# Patient Record
Sex: Female | Born: 1935 | Race: White | Hispanic: No | Smoking: Never smoker
Health system: Southern US, Community
[De-identification: ages and names within clinical notes are randomized; demographics above are authoritative.]

## PROBLEM LIST (undated history)

## (undated) DIAGNOSIS — N3010 Interstitial cystitis (chronic) without hematuria: Secondary | ICD-10-CM

## (undated) DIAGNOSIS — K219 Gastro-esophageal reflux disease without esophagitis: Secondary | ICD-10-CM

## (undated) DIAGNOSIS — K589 Irritable bowel syndrome without diarrhea: Secondary | ICD-10-CM

## (undated) DIAGNOSIS — T8859XA Other complications of anesthesia, initial encounter: Secondary | ICD-10-CM

## (undated) DIAGNOSIS — I714 Abdominal aortic aneurysm, without rupture, unspecified: Secondary | ICD-10-CM

## (undated) DIAGNOSIS — R112 Nausea with vomiting, unspecified: Secondary | ICD-10-CM

## (undated) DIAGNOSIS — I4891 Unspecified atrial fibrillation: Secondary | ICD-10-CM

## (undated) DIAGNOSIS — M51379 Other intervertebral disc degeneration, lumbosacral region without mention of lumbar back pain or lower extremity pain: Secondary | ICD-10-CM

## (undated) DIAGNOSIS — Z9889 Other specified postprocedural states: Secondary | ICD-10-CM

## (undated) DIAGNOSIS — F329 Major depressive disorder, single episode, unspecified: Secondary | ICD-10-CM

## (undated) DIAGNOSIS — M1990 Unspecified osteoarthritis, unspecified site: Secondary | ICD-10-CM

## (undated) DIAGNOSIS — M5137 Other intervertebral disc degeneration, lumbosacral region: Secondary | ICD-10-CM

## (undated) DIAGNOSIS — Z87440 Personal history of urinary (tract) infections: Secondary | ICD-10-CM

## (undated) DIAGNOSIS — I2510 Atherosclerotic heart disease of native coronary artery without angina pectoris: Secondary | ICD-10-CM

## (undated) DIAGNOSIS — F32A Depression, unspecified: Secondary | ICD-10-CM

## (undated) DIAGNOSIS — T4145XA Adverse effect of unspecified anesthetic, initial encounter: Secondary | ICD-10-CM

## (undated) DIAGNOSIS — J302 Other seasonal allergic rhinitis: Secondary | ICD-10-CM

## (undated) DIAGNOSIS — K5790 Diverticulosis of intestine, part unspecified, without perforation or abscess without bleeding: Secondary | ICD-10-CM

## (undated) DIAGNOSIS — E785 Hyperlipidemia, unspecified: Secondary | ICD-10-CM

## (undated) DIAGNOSIS — M8580 Other specified disorders of bone density and structure, unspecified site: Secondary | ICD-10-CM

## (undated) DIAGNOSIS — C4490 Unspecified malignant neoplasm of skin, unspecified: Secondary | ICD-10-CM

## (undated) HISTORY — DX: Diverticulosis of intestine, part unspecified, without perforation or abscess without bleeding: K57.90

## (undated) HISTORY — DX: Abdominal aortic aneurysm, without rupture, unspecified: I71.40

## (undated) HISTORY — PX: CYSTOCELE REPAIR: SHX163

## (undated) HISTORY — DX: Depression, unspecified: F32.A

## (undated) HISTORY — PX: BREAST SURGERY: SHX581

## (undated) HISTORY — DX: Atherosclerotic heart disease of native coronary artery without angina pectoris: I25.10

## (undated) HISTORY — PX: ABDOMINAL HYSTERECTOMY: SHX81

## (undated) HISTORY — DX: Unspecified osteoarthritis, unspecified site: M19.90

## (undated) HISTORY — PX: REFRACTIVE SURGERY: SHX103

## (undated) HISTORY — DX: Abdominal aortic aneurysm, without rupture: I71.4

## (undated) HISTORY — DX: Unspecified atrial fibrillation: I48.91

## (undated) HISTORY — PX: RECTOCELE REPAIR: SHX761

## (undated) HISTORY — DX: Gastro-esophageal reflux disease without esophagitis: K21.9

## (undated) HISTORY — DX: Irritable bowel syndrome, unspecified: K58.9

## (undated) HISTORY — DX: Major depressive disorder, single episode, unspecified: F32.9

## (undated) HISTORY — DX: Hyperlipidemia, unspecified: E78.5

## (undated) HISTORY — DX: Other specified disorders of bone density and structure, unspecified site: M85.80

---

## 1995-12-18 HISTORY — PX: RIGHT COLECTOMY: SHX853

## 1997-11-16 HISTORY — PX: HERNIA REPAIR: SHX51

## 1998-09-16 HISTORY — PX: ESOPHAGOGASTRODUODENOSCOPY: SHX1529

## 1999-09-25 ENCOUNTER — Encounter: Admission: RE | Admit: 1999-09-25 | Discharge: 1999-09-25 | Payer: Self-pay | Admitting: Family Medicine

## 1999-09-29 ENCOUNTER — Encounter: Admission: RE | Admit: 1999-09-29 | Discharge: 1999-09-29 | Payer: Self-pay | Admitting: Family Medicine

## 1999-11-27 ENCOUNTER — Encounter: Payer: Self-pay | Admitting: Surgery

## 1999-11-27 ENCOUNTER — Ambulatory Visit (HOSPITAL_BASED_OUTPATIENT_CLINIC_OR_DEPARTMENT_OTHER): Admission: RE | Admit: 1999-11-27 | Discharge: 1999-11-27 | Payer: Self-pay | Admitting: Surgery

## 2000-05-17 LAB — HM DEXA SCAN

## 2000-10-08 ENCOUNTER — Encounter: Admission: RE | Admit: 2000-10-08 | Discharge: 2000-10-08 | Payer: Self-pay | Admitting: Family Medicine

## 2000-10-08 ENCOUNTER — Encounter: Payer: Self-pay | Admitting: Family Medicine

## 2000-10-14 ENCOUNTER — Ambulatory Visit (HOSPITAL_COMMUNITY): Admission: RE | Admit: 2000-10-14 | Discharge: 2000-10-14 | Payer: Self-pay | Admitting: Orthopedic Surgery

## 2000-10-14 ENCOUNTER — Encounter: Payer: Self-pay | Admitting: Orthopedic Surgery

## 2000-10-29 ENCOUNTER — Ambulatory Visit (HOSPITAL_COMMUNITY): Admission: RE | Admit: 2000-10-29 | Discharge: 2000-10-29 | Payer: Self-pay | Admitting: Internal Medicine

## 2000-10-29 ENCOUNTER — Encounter: Payer: Self-pay | Admitting: Orthopedic Surgery

## 2000-11-12 ENCOUNTER — Encounter: Payer: Self-pay | Admitting: Orthopedic Surgery

## 2000-11-12 ENCOUNTER — Ambulatory Visit (HOSPITAL_COMMUNITY): Admission: RE | Admit: 2000-11-12 | Discharge: 2000-11-12 | Payer: Self-pay | Admitting: Orthopedic Surgery

## 2001-03-14 ENCOUNTER — Emergency Department (HOSPITAL_COMMUNITY): Admission: EM | Admit: 2001-03-14 | Discharge: 2001-03-14 | Payer: Self-pay

## 2001-10-09 ENCOUNTER — Encounter: Payer: Self-pay | Admitting: Family Medicine

## 2001-10-09 ENCOUNTER — Encounter: Admission: RE | Admit: 2001-10-09 | Discharge: 2001-10-09 | Payer: Self-pay | Admitting: Family Medicine

## 2002-09-23 ENCOUNTER — Encounter: Payer: Self-pay | Admitting: Family Medicine

## 2002-09-23 ENCOUNTER — Other Ambulatory Visit: Admission: RE | Admit: 2002-09-23 | Discharge: 2002-09-23 | Payer: Self-pay | Admitting: Family Medicine

## 2002-09-23 LAB — CONVERTED CEMR LAB: Pap Smear: NORMAL

## 2002-10-02 LAB — FECAL OCCULT BLOOD, GUAIAC: Fecal Occult Blood: NEGATIVE

## 2002-10-22 ENCOUNTER — Encounter: Admission: RE | Admit: 2002-10-22 | Discharge: 2002-10-22 | Payer: Self-pay | Admitting: Family Medicine

## 2002-10-22 ENCOUNTER — Encounter: Payer: Self-pay | Admitting: Family Medicine

## 2002-10-22 LAB — HM MAMMOGRAPHY: HM Mammogram: NORMAL

## 2002-12-22 ENCOUNTER — Encounter: Payer: Self-pay | Admitting: Gastroenterology

## 2002-12-22 ENCOUNTER — Ambulatory Visit (HOSPITAL_COMMUNITY): Admission: RE | Admit: 2002-12-22 | Discharge: 2002-12-22 | Payer: Self-pay | Admitting: Gastroenterology

## 2003-01-26 LAB — HM COLONOSCOPY

## 2005-02-27 ENCOUNTER — Ambulatory Visit: Payer: Self-pay | Admitting: Family Medicine

## 2005-03-09 ENCOUNTER — Ambulatory Visit: Payer: Self-pay | Admitting: Family Medicine

## 2005-03-15 ENCOUNTER — Ambulatory Visit: Payer: Self-pay | Admitting: Family Medicine

## 2005-10-02 ENCOUNTER — Ambulatory Visit: Payer: Self-pay | Admitting: Family Medicine

## 2006-01-07 ENCOUNTER — Ambulatory Visit: Payer: Self-pay | Admitting: Family Medicine

## 2006-05-07 ENCOUNTER — Ambulatory Visit: Payer: Self-pay | Admitting: Family Medicine

## 2006-08-27 ENCOUNTER — Ambulatory Visit: Payer: Self-pay | Admitting: Family Medicine

## 2006-09-27 ENCOUNTER — Ambulatory Visit: Payer: Self-pay | Admitting: Family Medicine

## 2007-02-19 ENCOUNTER — Ambulatory Visit: Payer: Self-pay | Admitting: Family Medicine

## 2007-05-14 ENCOUNTER — Emergency Department (HOSPITAL_COMMUNITY): Admission: EM | Admit: 2007-05-14 | Discharge: 2007-05-14 | Payer: Self-pay | Admitting: Emergency Medicine

## 2007-05-14 ENCOUNTER — Telehealth (INDEPENDENT_AMBULATORY_CARE_PROVIDER_SITE_OTHER): Payer: Self-pay | Admitting: *Deleted

## 2007-05-19 ENCOUNTER — Ambulatory Visit: Payer: Self-pay | Admitting: Family Medicine

## 2007-05-19 DIAGNOSIS — Z87440 Personal history of urinary (tract) infections: Secondary | ICD-10-CM

## 2007-06-02 ENCOUNTER — Encounter: Payer: Self-pay | Admitting: Family Medicine

## 2007-06-02 DIAGNOSIS — M48061 Spinal stenosis, lumbar region without neurogenic claudication: Secondary | ICD-10-CM | POA: Insufficient documentation

## 2007-06-02 DIAGNOSIS — M1990 Unspecified osteoarthritis, unspecified site: Secondary | ICD-10-CM | POA: Insufficient documentation

## 2007-06-02 DIAGNOSIS — K589 Irritable bowel syndrome without diarrhea: Secondary | ICD-10-CM

## 2007-06-02 DIAGNOSIS — K219 Gastro-esophageal reflux disease without esophagitis: Secondary | ICD-10-CM

## 2007-06-02 DIAGNOSIS — E7800 Pure hypercholesterolemia, unspecified: Secondary | ICD-10-CM

## 2007-06-02 DIAGNOSIS — J45909 Unspecified asthma, uncomplicated: Secondary | ICD-10-CM

## 2007-06-02 DIAGNOSIS — N3010 Interstitial cystitis (chronic) without hematuria: Secondary | ICD-10-CM

## 2007-06-03 ENCOUNTER — Ambulatory Visit: Payer: Self-pay | Admitting: Family Medicine

## 2007-06-03 DIAGNOSIS — F329 Major depressive disorder, single episode, unspecified: Secondary | ICD-10-CM

## 2007-06-06 ENCOUNTER — Encounter: Payer: Self-pay | Admitting: Family Medicine

## 2007-06-18 ENCOUNTER — Telehealth (INDEPENDENT_AMBULATORY_CARE_PROVIDER_SITE_OTHER): Payer: Self-pay | Admitting: *Deleted

## 2007-06-24 ENCOUNTER — Telehealth: Payer: Self-pay | Admitting: Family Medicine

## 2007-06-26 ENCOUNTER — Ambulatory Visit: Payer: Self-pay | Admitting: Family Medicine

## 2007-06-26 LAB — CONVERTED CEMR LAB
HCT: 38.3 % (ref 36.0–46.0)
MCV: 89.2 fL (ref 78.0–100.0)
Neutro Abs: 6 10*3/uL (ref 1.4–7.7)
Neutrophils Relative %: 66.6 % (ref 43.0–77.0)
Platelets: 238 10*3/uL (ref 150–400)
RDW: 12.8 % (ref 11.5–14.6)
WBC: 9 10*3/uL (ref 4.5–10.5)

## 2007-09-18 ENCOUNTER — Ambulatory Visit: Payer: Self-pay | Admitting: Family Medicine

## 2008-01-15 ENCOUNTER — Ambulatory Visit: Payer: Self-pay | Admitting: Family Medicine

## 2008-01-19 ENCOUNTER — Ambulatory Visit: Payer: Self-pay

## 2008-01-21 ENCOUNTER — Encounter: Payer: Self-pay | Admitting: Family Medicine

## 2008-02-13 ENCOUNTER — Encounter: Payer: Self-pay | Admitting: Family Medicine

## 2008-05-07 ENCOUNTER — Encounter: Payer: Self-pay | Admitting: Family Medicine

## 2008-06-02 ENCOUNTER — Ambulatory Visit: Payer: Self-pay | Admitting: Family Medicine

## 2008-06-04 LAB — CONVERTED CEMR LAB
Basophils Relative: 0.3 % (ref 0.0–1.0)
Eosinophils Absolute: 0.1 10*3/uL (ref 0.0–0.7)
Eosinophils Relative: 1.3 % (ref 0.0–5.0)
HCT: 43 % (ref 36.0–46.0)
Lymphocytes Relative: 31.4 % (ref 12.0–46.0)
MCHC: 33.4 g/dL (ref 30.0–36.0)
MCV: 91.9 fL (ref 78.0–100.0)
Monocytes Absolute: 0.3 10*3/uL (ref 0.1–1.0)
Monocytes Relative: 4.3 % (ref 3.0–12.0)
Neutrophils Relative %: 62.7 % (ref 43.0–77.0)
Platelets: 213 10*3/uL (ref 150–400)
RBC: 4.68 M/uL (ref 3.87–5.11)

## 2008-06-11 ENCOUNTER — Telehealth (INDEPENDENT_AMBULATORY_CARE_PROVIDER_SITE_OTHER): Payer: Self-pay | Admitting: *Deleted

## 2008-06-30 ENCOUNTER — Ambulatory Visit: Payer: Self-pay | Admitting: Family Medicine

## 2008-06-30 LAB — CONVERTED CEMR LAB
Casts: 0 /lpf
Glucose, Urine, Semiquant: NEGATIVE
RBC / HPF: 1
Specific Gravity, Urine: <1.005

## 2008-07-01 ENCOUNTER — Encounter: Payer: Self-pay | Admitting: Family Medicine

## 2008-07-08 ENCOUNTER — Encounter: Payer: Self-pay | Admitting: Family Medicine

## 2008-07-14 ENCOUNTER — Encounter: Payer: Self-pay | Admitting: Family Medicine

## 2008-07-14 ENCOUNTER — Ambulatory Visit: Payer: Self-pay | Admitting: Vascular Surgery

## 2008-07-20 ENCOUNTER — Telehealth: Payer: Self-pay | Admitting: Family Medicine

## 2008-07-20 ENCOUNTER — Ambulatory Visit: Payer: Self-pay | Admitting: Family Medicine

## 2008-09-14 ENCOUNTER — Ambulatory Visit: Payer: Self-pay | Admitting: Family Medicine

## 2008-10-13 ENCOUNTER — Ambulatory Visit: Payer: Self-pay | Admitting: Family Medicine

## 2008-10-18 ENCOUNTER — Telehealth (INDEPENDENT_AMBULATORY_CARE_PROVIDER_SITE_OTHER): Payer: Self-pay | Admitting: Internal Medicine

## 2008-10-21 ENCOUNTER — Ambulatory Visit: Payer: Self-pay | Admitting: Family Medicine

## 2009-01-13 ENCOUNTER — Ambulatory Visit: Payer: Self-pay | Admitting: Vascular Surgery

## 2009-06-01 ENCOUNTER — Ambulatory Visit: Payer: Self-pay | Admitting: Family Medicine

## 2009-06-06 ENCOUNTER — Ambulatory Visit: Payer: Self-pay | Admitting: Family Medicine

## 2009-06-06 ENCOUNTER — Telehealth (INDEPENDENT_AMBULATORY_CARE_PROVIDER_SITE_OTHER): Payer: Self-pay | Admitting: *Deleted

## 2009-07-13 ENCOUNTER — Ambulatory Visit: Payer: Self-pay | Admitting: Vascular Surgery

## 2009-07-14 ENCOUNTER — Ambulatory Visit: Payer: Self-pay | Admitting: Internal Medicine

## 2009-07-14 ENCOUNTER — Inpatient Hospital Stay (HOSPITAL_COMMUNITY): Admission: EM | Admit: 2009-07-14 | Discharge: 2009-07-16 | Payer: Self-pay | Admitting: Emergency Medicine

## 2009-07-14 ENCOUNTER — Ambulatory Visit: Payer: Self-pay | Admitting: Family Medicine

## 2009-07-15 ENCOUNTER — Encounter: Payer: Self-pay | Admitting: Family Medicine

## 2009-07-17 DIAGNOSIS — I2510 Atherosclerotic heart disease of native coronary artery without angina pectoris: Secondary | ICD-10-CM

## 2009-07-17 HISTORY — DX: Atherosclerotic heart disease of native coronary artery without angina pectoris: I25.10

## 2009-07-18 ENCOUNTER — Telehealth: Payer: Self-pay | Admitting: Family Medicine

## 2009-07-28 ENCOUNTER — Ambulatory Visit: Payer: Self-pay | Admitting: Family Medicine

## 2009-07-28 DIAGNOSIS — I2510 Atherosclerotic heart disease of native coronary artery without angina pectoris: Secondary | ICD-10-CM | POA: Insufficient documentation

## 2009-07-28 LAB — CONVERTED CEMR LAB
Bacteria, UA: 0
Casts: 0 /lpf
Ketones, urine, test strip: NEGATIVE
Mucus, UA: 0
Specific Gravity, Urine: 1.01
Urine crystals, microscopic: 0 /hpf
WBC Urine, dipstick: NEGATIVE
WBC, UA: 1 cells/hpf
pH: 6

## 2009-08-11 ENCOUNTER — Telehealth: Payer: Self-pay | Admitting: Family Medicine

## 2009-08-16 ENCOUNTER — Telehealth: Payer: Self-pay | Admitting: Family Medicine

## 2009-08-24 ENCOUNTER — Ambulatory Visit: Payer: Self-pay | Admitting: Internal Medicine

## 2009-09-14 ENCOUNTER — Ambulatory Visit: Payer: Self-pay | Admitting: Family Medicine

## 2009-09-19 ENCOUNTER — Encounter: Payer: Self-pay | Admitting: Family Medicine

## 2009-09-21 ENCOUNTER — Encounter: Payer: Self-pay | Admitting: Family Medicine

## 2009-09-21 ENCOUNTER — Encounter: Admission: RE | Admit: 2009-09-21 | Discharge: 2009-09-21 | Payer: Self-pay | Admitting: Gastroenterology

## 2009-09-28 ENCOUNTER — Ambulatory Visit: Payer: Self-pay | Admitting: Vascular Surgery

## 2009-09-28 ENCOUNTER — Encounter: Payer: Self-pay | Admitting: Family Medicine

## 2009-10-17 ENCOUNTER — Ambulatory Visit: Payer: Self-pay | Admitting: Family Medicine

## 2009-10-26 ENCOUNTER — Ambulatory Visit (HOSPITAL_COMMUNITY): Admission: RE | Admit: 2009-10-26 | Discharge: 2009-10-26 | Payer: Self-pay | Admitting: Urology

## 2009-12-17 DIAGNOSIS — M8580 Other specified disorders of bone density and structure, unspecified site: Secondary | ICD-10-CM

## 2009-12-17 HISTORY — DX: Other specified disorders of bone density and structure, unspecified site: M85.80

## 2009-12-25 ENCOUNTER — Emergency Department (HOSPITAL_COMMUNITY): Admission: EM | Admit: 2009-12-25 | Discharge: 2009-12-25 | Payer: Self-pay | Admitting: Family Medicine

## 2010-01-11 ENCOUNTER — Ambulatory Visit: Payer: Self-pay | Admitting: Vascular Surgery

## 2010-01-19 ENCOUNTER — Telehealth (INDEPENDENT_AMBULATORY_CARE_PROVIDER_SITE_OTHER): Payer: Self-pay | Admitting: *Deleted

## 2010-01-20 ENCOUNTER — Ambulatory Visit: Payer: Self-pay | Admitting: Family Medicine

## 2010-04-26 ENCOUNTER — Ambulatory Visit: Payer: Self-pay | Admitting: Family Medicine

## 2010-05-01 ENCOUNTER — Telehealth: Payer: Self-pay | Admitting: Family Medicine

## 2010-06-22 ENCOUNTER — Telehealth: Payer: Self-pay | Admitting: Family Medicine

## 2010-08-09 ENCOUNTER — Ambulatory Visit: Payer: Self-pay | Admitting: Vascular Surgery

## 2010-08-15 ENCOUNTER — Encounter: Payer: Self-pay | Admitting: Family Medicine

## 2010-10-11 ENCOUNTER — Encounter: Admission: RE | Admit: 2010-10-11 | Discharge: 2010-10-11 | Payer: Self-pay | Admitting: Obstetrics and Gynecology

## 2010-10-16 ENCOUNTER — Ambulatory Visit: Payer: Self-pay | Admitting: Family Medicine

## 2010-12-04 ENCOUNTER — Telehealth (INDEPENDENT_AMBULATORY_CARE_PROVIDER_SITE_OTHER): Payer: Self-pay | Admitting: *Deleted

## 2010-12-05 ENCOUNTER — Ambulatory Visit: Payer: Self-pay | Admitting: Family Medicine

## 2010-12-05 LAB — CONVERTED CEMR LAB
Basophils Absolute: 0.1 10*3/uL (ref 0.0–0.1)
Basophils Relative: 1.1 % (ref 0.0–3.0)
Cholesterol: 247 mg/dL — ABNORMAL HIGH (ref 0–200)
Creatinine, Ser: 0.9 mg/dL (ref 0.4–1.2)
Direct LDL: 173.9 mg/dL
Eosinophils Absolute: 0.1 10*3/uL (ref 0.0–0.7)
Eosinophils Relative: 1.7 % (ref 0.0–5.0)
HCT: 40.5 % (ref 36.0–46.0)
Lymphs Abs: 2.4 10*3/uL (ref 0.7–4.0)
MCV: 91.1 fL (ref 78.0–100.0)
Neutro Abs: 3.8 10*3/uL (ref 1.4–7.7)
Phosphorus: 3.8 mg/dL (ref 2.3–4.6)
Potassium: 4.3 meq/L (ref 3.5–5.1)
Sodium: 141 meq/L (ref 135–145)
Total CHOL/HDL Ratio: 5
Triglycerides: 144 mg/dL (ref 0.0–149.0)

## 2010-12-13 ENCOUNTER — Ambulatory Visit: Payer: Self-pay | Admitting: Family Medicine

## 2010-12-20 ENCOUNTER — Encounter: Payer: Self-pay | Admitting: Family Medicine

## 2011-01-06 ENCOUNTER — Encounter: Payer: Self-pay | Admitting: Obstetrics and Gynecology

## 2011-01-16 NOTE — Assessment & Plan Note (Signed)
Summary: COLD/SINUS INFECTION/DLO   Vital Signs:  Patient profile:   77 year old female Height:      65.5 inches Weight:      166 pounds BMI:     27.30 Temp:     97.6 degrees F oral Pulse rate:   64 / minute Pulse rhythm:   regular BP sitting:   120 / 70  (left arm) Cuff size:   regular  Vitals Entered By: Linde Gillis CMA Duncan Dull) (October 16, 2010 9:46 AM) CC: ? cold/sinus infection   History of Present Illness: 77 yo new to me here for ?sinus infection.  Over 2 weeks of sinus pressure. Ears hurt.  Mild dry cough. Per daughter, sinus pressure is worsening. Not taking anything OTC.  PCN allergic.  Afebrile, no wheezing or SOB.  Having skin surgery tomorrow and wants to make sure it is ok that she still has it done.  Current Medications (verified): 1)  Proair Hfa 108 (90 Base) Mcg/act Aers (Albuterol Sulfate) .... 2 Puffs Up To Every 4 Hours As Needed Wheezing 2)  Tums .Marland Kitchen.. 1-2 By Mouth As Needed 3)  Tylenol Extra Strength 500 Mg  Tabs (Acetaminophen) .Marland Kitchen.. 1-3 By Mouth As Needed 4)  Prevacid 30 Mg  Cpdr (Lansoprazole) .Marland Kitchen.. 1 By Mouth Once Daily 5)  Amitiza 8 Mcg  Caps (Lubiprostone) .... Take 1 Tablet By Mouth Two Times A Day As Needed 6)  Xalatan 0.005 % Soln (Latanoprost) .Marland Kitchen.. 1 Drop in Each Eye At Bedtime 7)  Healthy Colon  Caps (Probiotic Product) .... One Daily 8)  Elmiron 100 Mg Caps (Pentosan Polysulfate Sodium) .... Take One Cap Twice A Day 9)  Earache Relief  Soln (Homeopathic Products) .... Otc As Directed. 10)  Levaquin 500 Mg Tabs (Levofloxacin) .Marland Kitchen.. 1 By Mouth Once Daily For 10 Days For Sinus Infection 11)  Flonase 50 Mcg/act Susp (Fluticasone Propionate) .... 2 Sprays in Each Nostril Once Daily 12)  Azithromycin 250 Mg  Tabs (Azithromycin) .... 2 By  Mouth Today and Then 1 Daily For 4 Days  Allergies (verified): 1)  ! Zoloft (Sertraline Hcl) 2)  ! Augmentin 3)  * Crestor 4)  Advil 5)  * Omeprezole  Past History:  Past Medical History: Last updated:  08/24/2009 Osteoarthritis- spine / spinal stenosis GERD Refuses tx for elevated cholesterol- past started on statin after hosp stay 8/10 hemorrhoids IC (DMSO in past) allergic rhinitis  IBS symptoms  diverticulosis CAD (mod by cath 8/10) AAA.     --followed by Dr. Darrick Penna. last u/s 7/10: Stable abdominal aortic aneurysm with largest measurement of 3.97       cm x 4.02 cm.    GI-- [facility]-- [facility]  cardiology- [facility] - Dr Darrick Penna   Past Surgical History: Last updated: 09/27/2009 Colectomy- right, benign lesion Hysterectomy- partial, not cancer Cystocele repair Rectocele repair Umb. hernia (11/1997) EGD- erosive esophagitis, ? stricture (09/1998) Colonoscopy- diverticulosis (09/1998) Dexa- mild osteopenia (05/2000) EGD- stricture- treated (12/2002) Colonoscopy- diverticulosis (01/2003) cardiac cath 8/10- mod coronary artery disease hosp 7/10 for CP/ and uti  infrarenal aortic aneurysm CT 2010 (followed by Dr Darrick Penna)  Family History: Last updated: 15-Feb-2010  The patient's mother died at age 60 of heart attack,   the patient's father at age 64 from a heart attack.  She had 2 brothers,   one died from a stroke and another one from a heart attack.   Social History: Last updated: 06/02/2008 Marital Status: Married Children:  no smoking or alcohol  Risk Factors: Smoking Status: never (06/02/2007)  Review of Systems      See HPI General:  Denies chills and fever. ENT:  Complains of nasal congestion, postnasal drainage, sinus pressure, and sore throat; denies difficulty swallowing. Resp:  Complains of cough; denies shortness of breath, sputum productive, and wheezing.  Physical Exam  General:  fatigued elderly female non toxic appearing. VSS Eyes:  vision grossly intact, pupils equal, pupils round, pupils reactive to light, and no injection.   Ears:  TMs are dull , mod effusion bilaterally Nose:  nares are injected and congested bilaterally    Mouth:  pharynx pink and moist, no erythema, and no exudates.   Lungs:  Normal respiratory effort, chest expands symmetrically. Lungs are clear to auscultation, no crackles or wheezes. Heart:  Normal rate and regular rhythm. S1 and S2 normal without gallop, murmur, click, rub or other extra sounds. Extremities:  trace edema bilat ankles  Psych:  normal affect, talkative and pleasant    Impression & Recommendations:  Problem # 1:  SINUSITIS - ACUTE-NOS (ICD-461.9) Assessment New  Given duration and progression of symptoms, will treat with Zpack. Advised ok with me if she continues with surgery tomorrow but she should double check with dermatologist. Pt agreed with plan. Her updated medication list for this problem includes:    Levaquin 500 Mg Tabs (Levofloxacin) .Marland Kitchen... 1 by mouth once daily for 10 days for sinus infection    Flonase 50 Mcg/act Susp (Fluticasone propionate) .Marland Kitchen... 2 sprays in each nostril once daily    Azithromycin 250 Mg Tabs (Azithromycin) .Marland Kitchen... 2 by  mouth today and then 1 daily for 4 days  Orders: Prescription Created Electronically [id number])  Complete Medication List: 1)  Proair Hfa 108 (90 Base) Mcg/act Aers (Albuterol sulfate) .... 2 puffs up to every 4 hours as needed wheezing 2)  Tums  .Marland Kitchen.. 1-2 by mouth as needed 3)  Tylenol Extra Strength 500 Mg Tabs (Acetaminophen) .Marland Kitchen.. 1-3 by mouth as needed 4)  Prevacid 30 Mg Cpdr (Lansoprazole) .Marland Kitchen.. 1 by mouth once daily 5)  Amitiza 8 Mcg Caps (Lubiprostone) .... Take 1 tablet by mouth two times a day as needed 6)  Xalatan 0.005 % Soln (Latanoprost) .Marland Kitchen.. 1 drop in each eye at bedtime 7)  Healthy Colon Caps (Probiotic product) .... One daily 8)  Elmiron 100 Mg Caps (Pentosan polysulfate sodium) .... Take one cap twice a day 9)  Earache Relief Soln (Homeopathic products) .... Otc as directed. 10)  Levaquin 500 Mg Tabs (Levofloxacin) .Marland Kitchen.. 1 by mouth once daily for 10 days for sinus infection 11)  Flonase 50 Mcg/act Susp  (Fluticasone propionate) .... 2 sprays in each nostril once daily 12)  Azithromycin 250 Mg Tabs (Azithromycin) .... 2 by  mouth today and then 1 daily for 4 days Prescriptions: AZITHROMYCIN 250 MG  TABS (AZITHROMYCIN) 2 by  mouth today and then 1 daily for 4 days  #6 x 0   Entered and Authorized by:   Ruthe Mannan MD   Signed by:   Ruthe Mannan MD on 10/16/2010   Method used:   Electronically to        [facility]* (retail)       [address]       [place], [place]  [address]       Ph: [phone]       Fax: [phone]   RxID:   [id number]    Orders Added: 1)  Est. Patient Level III [44010] 2)  Prescription Created Electronically [  [id number]]

## 2011-01-16 NOTE — Progress Notes (Signed)
Summary: depression   Phone Note Call from Patient Call back at Home Phone [phone]   Caller: Patient Call For: Judith Part MD Summary of Call: Patient wanted to schedule an appointment with you for tomorrow and there are no available app. She says that she is suffering from depression so we scheduled her with Dr. Dayton Martes. Dr. Dayton Martes said she would prefer not to see her for depression because she is your patient. Is there any way you can see her tomorrow. Please advise.  Initial call taken by: Melody Comas,  January 19, 2010 3:44 PM  Follow-up for Phone Call        Lyla Son came to let me know that she found a place on the schedule to see you tomorrow so she is already scheduled. Follow-up by: Melody Comas,  January 19, 2010 5:04 PM

## 2011-01-16 NOTE — Assessment & Plan Note (Signed)
Summary: EAR PAIN   Vital Signs:  Patient profile:   77 year old female Height:      65.5 inches Weight:      168.25 pounds BMI:     27.67 Temp:     97.7 degrees F oral Pulse rate:   76 / minute Pulse rhythm:   regular BP sitting:   120 / 76  (left arm) Cuff size:   regular  Vitals Entered By: Lewanda Rife LPN (Apr 26, 2010 10:14 AM) CC: Both ears hurt, sinus aches, drainage at back of throat, head or scalp is sore and h/a, fever on and off   History of Present Illness: may have a sinus infection  ear pain and head pain and lots of congestion and drainage started april 22  son had it too   all going out back of throat yellow in color  sick last week  last week 101 fever- gone now   used some otc ear pain drops  was on claritin for drainage  then tried zyrtec- it helped       Allergies: 1)  ! Zoloft (Sertraline Hcl) 2)  ! Augmentin 3)  * Crestor 4)  Advil 5)  * Omeprezole  Past History:  Past Medical History: Last updated: 08/24/2009 Osteoarthritis- spine / spinal stenosis GERD Refuses tx for elevated cholesterol- past started on statin after hosp stay 8/10 hemorrhoids IC (DMSO in past) allergic rhinitis  IBS symptoms  diverticulosis CAD (mod by cath 8/10) AAA.     --followed by Dr. Darrick Penna. last u/s 7/10: Stable abdominal aortic aneurysm with largest measurement of 3.97       cm x 4.02 cm.    GI-- [facility] urol-- [facility]  cardiology- [facility] - Dr Darrick Penna   Past Surgical History: Last updated: 09/27/2009 Colectomy- right, benign lesion Hysterectomy- partial, not cancer Cystocele repair Rectocele repair Umb. hernia (11/1997) EGD- erosive esophagitis, ? stricture (09/1998) Colonoscopy- diverticulosis (09/1998) Dexa- mild osteopenia (05/2000) EGD- stricture- treated (12/2002) Colonoscopy- diverticulosis (01/2003) cardiac cath 8/10- mod coronary artery disease hosp 7/10 for CP/ and uti  infrarenal aortic aneurysm CT 2010 (followed by Dr  Darrick Penna)  Family History: Last updated: 01/30/10  The patient's mother died at age 28 of heart attack,   the patient's father at age 71 from a heart attack.  She had 2 brothers,   one died from a stroke and another one from a heart attack.   Social History: Last updated: 06/02/2008 Marital Status: Married Children:  no smoking or alcohol   Risk Factors: Smoking Status: never (06/02/2007)  Review of Systems General:  Denies chills, fever, and malaise. Eyes:  Denies blurring, discharge, and eye irritation. ENT:  Complains of earache, nasal congestion, postnasal drainage, sinus pressure, and sore throat; denies ear discharge. CV:  Denies chest pain or discomfort and palpitations. Resp:  Complains of cough; denies shortness of breath, sputum productive, and wheezing. GI:  Denies diarrhea, nausea, and vomiting. Derm:  Denies lesion(s), poor wound healing, and rash. Neuro:  Denies headaches. Allergy:  Complains of seasonal allergies.  Physical Exam  General:  fatigued elderly female - not ill app Head:  normocephalic, atraumatic, and no abnormalities observed.  frontal and maxillary sinus tenderness Eyes:  vision grossly intact, pupils equal, pupils round, pupils reactive to light, and no injection.   Ears:  TMs are dull with eff blat - no erythema or bulging  Nose:  nares are injected and congested bilaterally  Mouth:  pharynx pink and moist, no erythema, and  no exudates.   Neck:  supple with full rom and no masses or thyromegally, no JVD or carotid bruit  Chest Wall:  No deformities, masses, or tenderness noted. Lungs:  Normal respiratory effort, chest expands symmetrically. Lungs are clear to auscultation, no crackles or wheezes. Heart:  Normal rate and regular rhythm. S1 and S2 normal without gallop, murmur, click, rub or other extra sounds. Skin:  Intact without suspicious lesions or rashes Cervical Nodes:  No lymphadenopathy noted Psych:  normal affect, talkative and  pleasant    Impression & Recommendations:  Problem # 1:  SINUSITIS - ACUTE-NOS (ICD-461.9) Assessment New  s/p uri and allergies with sinus pain and etd tx with levaquin (is pcn all)  flonase daily recommend sympt care- see pt instructions  pt advised to update me if symptoms worsen or do not improve  Her updated medication list for this problem includes:    Levaquin 500 Mg Tabs (Levofloxacin) .Marland Kitchen... 1 by mouth once daily for 10 days for sinus infection    Flonase 50 Mcg/act Susp (Fluticasone propionate) .Marland Kitchen... 2 sprays in each nostril once daily  Orders: Prescription Created Electronically [phone])  Complete Medication List: 1)  Proair Hfa 108 (90 Base) Mcg/act Aers (Albuterol sulfate) .... 2 puffs up to every 4 hours as needed wheezing 2)  Tums  .Marland Kitchen.. 1-2 by mouth as needed 3)  Tylenol Extra Strength 500 Mg Tabs (Acetaminophen) .Marland Kitchen.. 1-3 by mouth as needed 4)  Prevacid 30 Mg Cpdr (Lansoprazole) .Marland Kitchen.. 1 by mouth once daily 5)  Amitiza 8 Mcg Caps (Lubiprostone) .... Take 1 tablet by mouth two times a day as needed 6)  Xalatan 0.005 % Soln (Latanoprost) .Marland Kitchen.. 1 drop in each eye at bedtime 7)  Healthy Colon Caps (Probiotic product) .... One daily 8)  Elmiron 100 Mg Caps (Pentosan polysulfate sodium) .... Take one cap twice a day 9)  Earache Relief Soln (Homeopathic products) .... Otc as directed. 10)  Levaquin 500 Mg Tabs (Levofloxacin) .Marland Kitchen.. 1 by mouth once daily for 10 days for sinus infection 11)  Flonase 50 Mcg/act Susp (Fluticasone propionate) .... 2 sprays in each nostril once daily  Patient Instructions: 1)  you can try mucinex over the counter twice daily as directed and nasal saline spray for congestion 2)  tylenol over the counter as directed may help with aches, headache and fever 3)  call if symptoms worsen or if not improved in 4-5 days  4)  take the levaquin as directed for sinus infection  5)  start flonase for drainage and congestion and ear pain  6)  update me if worse    7)  I [place] px [place] Prescriptions: FLONASE 50 MCG/ACT SUSP (FLUTICASONE PROPIONATE) 2 sprays in each nostril once daily  #1 mdi x 11   Entered and Authorized by:   Judith Part MD   Signed by:   Judith Part MD on 04/26/2010   Method used:   Electronically to        [facility]* (retail)       [address]       [place], [place]  [address]       Ph: [phone]       Fax: [phone]   RxID:   [id number] LEVAQUIN 500 MG TABS (LEVOFLOXACIN) 1 by mouth once daily for 10 days for sinus infection  #10 x 0   Entered and Authorized by:   Judith Part MD   Signed by:   Judith Part MD  on 04/26/2010   Method used:   Electronically to        [facility]* (retail)       [address]       [place], [place]  [address]       Ph: [phone]       Fax: [phone]   RxID:   [id number]   Current Allergies (reviewed today): ! ZOLOFT (SERTRALINE HCL) ! AUGMENTIN * CRESTOR ADVIL * OMEPREZOLE

## 2011-01-16 NOTE — Consult Note (Signed)
Summary: [facility]  [facility]   Imported By: Sherian Rein 09/01/2010 11:43:52  _____________________________________________________________________  External Attachment:    Type:   Image     Comment:   External Document

## 2011-01-16 NOTE — Progress Notes (Signed)
Summary: Question about med allergies  Phone Note Call from Patient   Caller: Daughter Reason for Call: Talk to Nurse Details for Reason: Pt's daughter requesting info on med allergies Summary of Call: Pt's daughter, Merrily Brittle, called for her mother, Ashlee Reyes, to ask for information on medication allergies.  The daughter states she lost the paper that Dr. Milinda Antis gave her with this information.  The daughter said that Ashlee Reyes has eye surgery on Wed and needs to provide this information for this surgery.  [name] asked for a call back at [phone] (Ashlee Reyes's phone number) or [phone] (Linda's number).  Thank you Initial call taken by: Clarisa Schools,  May 01, 2010 3:16 PM  Follow-up for Phone Call        Spoke with [name] and gave pt allergies or adverse reactions to medications; Augmentin, Zoloft, Crestor, Advil ro Omeprazole.Lewanda Rife LPN  May 01, 2010 5:13 PM

## 2011-01-16 NOTE — Progress Notes (Signed)
Summary: needs order for hearing test  Phone Note Call from Patient Call back at Home Phone [phone]   Caller: Daughter  Merrily Brittle Summary of Call: Pt needs order for hearing test that she will have done at [facility].  She needs new hearing aids and medicare will not pay for this unless she has order from her PCP.   Initial call taken by: Lowella Petties CMA,  June 22, 2010 10:32 AM  Follow-up for Phone Call        will do ref   Follow-up by: Judith Part MD,  June 22, 2010 12:22 PM  Additional Follow-up for Phone Call Additional follow up Details #1::        Left message for patient to call back. Lewanda Rife LPN  June 22, 3085 12:57 PM   Patient notified as instructed by telephone. Pt said would like appt on a Tues,Wed or Thur. Pt can be reached at [phone] or pt's daughter Merrily Brittle [phone]. 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            Lowella Petties CMA  June 23, 2010 8:38 AM  px is in IN box Follow-up by: Judith Part MD,  June 23, 2010 10:29 AM  Additional Follow-up for Phone Call Additional follow up Details #3:: Details for Additional Follow-up Action Taken: Patient notified as instructed by telephone. Note for hearing test left at front desk. 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

## 2011-01-16 NOTE — Assessment & Plan Note (Signed)
Summary: DEPRESSION/RBH   Vital Signs:  Patient profile:   77 year old female Weight:      170 pounds Temp:     97.7 degrees F oral Pulse rate:   72 / minute Pulse rhythm:   regular BP sitting:   140 / 80  (left arm) Cuff size:   regular  Vitals Entered By: Lowella Petties CMA 2010/02/16 3:42 PM) CC: Stress, ? depression, ankles swollen.   History of Present Illness: is here for stress/ depression  was crying when she talks to her family   is really depressed  her husb is in a nursing home-- dementia and copd / alz overall did well - now getting worse  is lethargic / slowed down no desire to do anything - worse the last few days  gets frustrated easily  gets very nervous and anxious   this has been going on for a while  was on zoloft for a while -- and that it made her worse (it helped for a while first)  has not felt suicidal at all  finds herself isolating herself   does not talk to family too much -- they get too pushy   son has depression   not much exercise    has a frog in throat - that bothers her  always clears her throat  has allergies  nose runs all the time  bought some claritin - thinks it helped a little   ankles are swollen -much more lately    Allergies: 1)  ! Zoloft (Sertraline Hcl) 2)  ! Augmentin 3)  * Crestor 4)  Advil 5)  * Omeprezole  Past History:  Past Medical History: Last updated: 08/24/2009 Osteoarthritis- spine / spinal stenosis GERD Refuses tx for elevated cholesterol- past started on statin after hosp stay 8/10 hemorrhoids IC (DMSO in past) allergic rhinitis  IBS symptoms  diverticulosis CAD (mod by cath 8/10) AAA.     --followed by Dr. Darrick Penna. last u/s 7/10: Stable abdominal aortic aneurysm with largest measurement of 3.97       cm x 4.02 cm.    GI-- [facility] urol-- [facility]  cardiology- [facility] - Dr Darrick Penna   Past Surgical History: Last updated: 09/27/2009 Colectomy- right, benign  lesion Hysterectomy- partial, not cancer Cystocele repair Rectocele repair Umb. hernia (11/1997) EGD- erosive esophagitis, ? stricture (09/1998) Colonoscopy- diverticulosis (09/1998) Dexa- mild osteopenia (05/2000) EGD- stricture- treated (12/2002) Colonoscopy- diverticulosis (01/2003) cardiac cath 8/10- mod coronary artery disease hosp 7/10 for CP/ and uti  infrarenal aortic aneurysm CT 2010 (followed by Dr Darrick Penna)  Family History: Last updated: 02/16/2010  The patient's mother died at age 3 of heart attack,   the patient's father at age 31 from a heart attack.  She had 2 brothers,   one died from a stroke and another one from a heart attack.   Social History: Last updated: 06/02/2008 Marital Status: Married Children:  no smoking or alcohol   Risk Factors: Smoking Status: never (06/02/2007)  Family History:  The patient's mother died at age 92 of heart attack,   the patient's father at age 60 from a heart attack.  She had 2 brothers,   one died from a stroke and another one from a heart attack.   Review of Systems General:  Complains of fatigue; denies fever, loss of appetite, and malaise. Eyes:  Denies blurring and eye pain. CV:  Complains of swelling of feet; denies chest pain or discomfort, palpitations, and shortness of  breath with exertion. Resp:  Denies cough and wheezing. GI:  Denies abdominal pain, bloody stools, change in bowel habits, and indigestion. Derm:  Denies poor wound healing and rash. Neuro:  Denies headaches, memory loss, numbness, and tingling. Psych:  Complains of depression; denies panic attacks. Endo:  Denies excessive thirst and excessive urination. Heme:  Denies abnormal bruising and bleeding.  Physical Exam  General:  elderly female in NAD Head:  normocephalic, atraumatic, and no abnormalities observed.  no sinus tenderness Eyes:  vision grossly intact, pupils equal, pupils round, pupils reactive to light, and no injection.   Ears:  R  ear normal and L ear normal.   Nose:  nares are boggy with some clear rhinorrhea Mouth:  pharynx pink and moist, no erythema, and no exudates.   Neck:  no carotid bruit or thyromegaly no cervical or supraclavicular lymphadenopathy  Lungs:  Normal respiratory effort, chest expands symmetrically. Lungs are clear to auscultation, no crackles or wheezes. Heart:  Normal rate and regular rhythm. S1 and S2 normal without gallop, murmur, click, rub or other extra sounds. Abdomen:  Bowel sounds positive,abdomen soft and non-tender without masses, organomegaly or hernias noted. Msk:  No deformity or scoliosis noted of thoracic or lumbar spine.   Extremities:  trace edema bilat ankles  Neurologic:  sensation intact to light touch, gait normal, and DTRs symmetrical and normal.  no tremor  Skin:  Intact without suspicious lesions or rashes Cervical Nodes:  No lymphadenopathy noted Inguinal Nodes:  No significant adenopathy Psych:  depressed affect fair eye contact not tearful   Impression & Recommendations:  Problem # 1:  DISORDER, DEPRESSIVE NEC (ICD-311) Assessment Deteriorated worse lately with situational stress disc this in detail along with coping mechanisms and support sources/ sympt and opt for tx  will ref for counseling  trial of paxil if she wants to - disc pros and cons / side eff and will f/u 1 mo later  Her updated medication list for this problem includes:    Paxil 10 Mg Tabs (Paroxetine hcl) .Marland Kitchen... 1 by mouth once daily  Orders: Psychology Referral (Psychology)  Problem # 2:  POSTNASAL DRIP SYNDROME (ICD-784.91) Assessment: New with hx of all rhinitis trial of claritin 10 mg and udate me adv to call if any sinus pain or fever   Problem # 3:  LEG EDEMA, BILATERAL (ICD-782.3) Assessment: New very mild- may be rel to salt intake and less activity no cardiac symptoms adv inc water and dec salt  elevate legs  update if not imp  consider diuretic - esp if bp inc at next visit    Complete Medication List: 1)  Proair Hfa 108 (90 Base) Mcg/act Aers (Albuterol sulfate) .... 2 puffs up to every 4 hours as needed wheezing 2)  Tums  .Marland Kitchen.. 1-2 by mouth as needed 3)  Tylenol Extra Strength 500 Mg Tabs (Acetaminophen) .Marland Kitchen.. 1-3 by mouth as needed 4)  Prevacid 30 Mg Cpdr (Lansoprazole) .Marland Kitchen.. 1 by mouth once daily 5)  Amitiza 8 Mcg Caps (Lubiprostone) .... Take 1 tablet by mouth two times a day as needed 6)  Miralax Pack (Polyethylene glycol 3350) .... Instructed to take when not using amitiza 7)  Xalatan 0.005 % Soln (Latanoprost) .Marland Kitchen.. 1 drop in each eye at bedtime 8)  Healthy Colon Caps (Probiotic product) .... One daily 9)  Paxil 10 Mg Tabs (Paroxetine hcl) .Marland Kitchen.. 1 by mouth once daily  Patient Instructions: 1)  increase your water early in the day 2)  watch salt  in the diet  3)  elevate your feet when you can  4)  try claritin 10 mg one pill daily for post nasal drip  5)  we will refer you to counseling at check out  6)  try the paxil if you want to start it  Prescriptions: PROAIR HFA 108 (90 BASE) MCG/ACT AERS (ALBUTEROL SULFATE) 2 puffs up to every 4 hours as needed wheezing  #1 mdi x 11   Entered and Authorized by:   Judith Part MD   Signed by:   Judith Part MD on 01/20/2010   Method used:   Electronically to        [facility]* (retail)       [address]       [place], [place]  [address]       Ph: [phone]       Fax: [phone]   RxID:   [id number] PAXIL 10 MG TABS (PAROXETINE HCL) 1 by mouth once daily  #30 x 5   Entered and Authorized by:   Judith Part MD   Signed by:   Judith Part MD on 01/20/2010   Method used:   Print then Give to Patient   RxID:   [id number]   Prior Medications (reviewed today): PROAIR HFA 108 (90 BASE) MCG/ACT AERS (ALBUTEROL SULFATE) 2 puffs up to every 4 hours as needed wheezing TUMS () 1-2 by mouth as needed TYLENOL EXTRA STRENGTH 500 MG  TABS (ACETAMINOPHEN) 1-3 by mouth as needed PREVACID 30  MG  CPDR (LANSOPRAZOLE) 1 by mouth once daily AMITIZA 8 MCG  CAPS (LUBIPROSTONE) Take 1 tablet by mouth two times a day as needed MIRALAX  PACK (POLYETHYLENE GLYCOL 3350) instructed to take when not using amitiza XALATAN 0.005 % SOLN (LATANOPROST) 1 drop in each eye at bedtime HEALTHY COLON  CAPS (PROBIOTIC PRODUCT) one daily PAXIL 10 MG TABS (PAROXETINE HCL) 1 by mouth once daily Current Allergies: ! ZOLOFT (SERTRALINE HCL) ! AUGMENTIN * CRESTOR ADVIL * OMEPREZOLE

## 2011-01-18 NOTE — Miscellaneous (Signed)
Summary: flu vaccine  Clinical Lists Changes  Observations: Added new observation of FLU VAX: Historical received [facility] (12/13/2010 16:15)      Immunization History:  Influenza Immunization History:    Influenza:  historical received [facility] (12/13/2010)

## 2011-01-18 NOTE — Assessment & Plan Note (Signed)
Summary: CPX/DLO   Vital Signs:  Patient profile:   77 year old female Height:      65.5 inches Weight:      173 pounds BMI:     28.45 Temp:     97.4 degrees F oral Pulse rate:   72 / minute Pulse rhythm:   regular BP sitting:   124 / 76  (left arm) Cuff size:   regular  Vitals Entered By: Lewanda Rife LPN (December 13, 2010 10:09 AM) CC: CPX GYN does pap and breast exam   History of Present Illness: here for check up of chronic med problems   is doing well physically  nothing new medically except a skin cancer removed on face - basal cell  has been under a lot of stress mood goes up and down -- family  in nursing home and 2 handicapped sons   last gyn check-- with Debbora Dus -- october  given a cream to use and this helped her burning -- ? what it was / really helped  ? poss antifungal - really unsure  exam but not a pap -- internal exam  also had Ct ovaries - was nl  no longer doing mammogram    wt is up 7 lb-- generally healthy diet - but eats too much and too many sweets at the holidays  more vegetables and nuts -- able to tolerate them   colon ca screen-- last colonosc 04 with diverticulosis -- rec 10 year f/u   flu shot --needs one and we are out -- will go to pharmacy for that   pneumovax--? when   zoster status - never had vaccine or disease   had a bone density test -- in october -- went to [facility]  ? osteopenia in hip does not take ca and vit D   bp is good at 124/76  high chol withLDL of 170s- declines med of any kind     Allergies: 1)  ! Zoloft (Sertraline Hcl) 2)  ! Augmentin 3)  * Crestor 4)  Advil 5)  * Omeprezole  Past History:  Family History: Last updated: 2010-02-17  The patient's mother died at age 75 of heart attack,   the patient's father at age 25 from a heart attack.  She had 2 brothers,   one died from a stroke and another one from a heart attack.   Social History: Last updated: 06/02/2008 Marital Status:  Married Children:  no smoking or alcohol   Risk Factors: Smoking Status: never (06/02/2007)  Past Medical History: Osteoarthritis- spine / spinal stenosis GERD Refuses tx for elevated cholesterol- past started on statin after hosp stay 8/10 hemorrhoids IC (DMSO in past) allergic rhinitis  IBS symptoms  diverticulosis CAD (mod by cath 8/10) AAA.     --followed by Dr. Darrick Penna. last u/s 7/10: Stable abdominal aortic aneurysm with largest measurement of 3.97       cm x 4.02 cm.  basal cell cancer- face  osteopenia 2011   GI-- [facility] urol-- [facility]  cardiology- [facility] - Dr Darrick Penna  [facility]--NP -- Debbora Dus   Past Surgical History: Colectomy- right, benign lesion Hysterectomy- partial, not cancer Cystocele repair Rectocele repair Umb. hernia (11/1997) EGD- erosive esophagitis, ? stricture (09/1998) Colonoscopy- diverticulosis (09/1998) Dexa- mild osteopenia (05/2000) EGD- stricture- treated (12/2002) Colonoscopy- diverticulosis (01/2003) cardiac cath 8/10- mod coronary artery disease hosp 7/10 for CP/ and uti  infrarenal aortic aneurysm CT 2010 (followed by  Dr Darrick Penna) basal cell skin lesion removed 2011 pelvic CT all normal -- from gyn 2011  had eye surgery -- laser (for film after her cataract surgery)  Review of Systems General:  Complains of fatigue; denies loss of appetite and malaise. Eyes:  Denies blurring and eye irritation. CV:  Denies chest pain or discomfort, lightheadness, and palpitations. Resp:  Denies cough, pleuritic, shortness of breath, and wheezing. GI:  Denies abdominal pain, bloody stools, change in bowel habits, indigestion, and nausea. GU:  Denies abnormal vaginal bleeding and discharge; some discomfort - burning . MS:  Complains of joint pain and stiffness; denies joint redness and joint swelling. Derm:  Denies itching, lesion(s), poor wound healing, and rash. Neuro:  Denies numbness and  tingling. Psych:  mood is overall fair . Endo:  Denies cold intolerance, excessive thirst, excessive urination, and heat intolerance. Heme:  Denies abnormal bruising and bleeding.  Physical Exam  General:  overweight but generally well appearing  elderly and somewhat frail appearing  Head:  normocephalic, atraumatic, and no abnormalities observed.   Eyes:  vision grossly intact, pupils equal, pupils round, and pupils reactive to light.  no conjunctival pallor, injection or icterus  Ears:  R ear normal and L ear normal.  - scant cerumen  Nose:  no nasal discharge.   Mouth:  pharynx pink and moist.   Neck:  supple with full rom and no masses or thyromegally, no JVD or carotid bruit  Chest Wall:  No deformities, masses, or tenderness noted. Lungs:  Normal respiratory effort, chest expands symmetrically. Lungs are clear to auscultation, no crackles or wheezes. Heart:  Normal rate and regular rhythm. S1 and S2 normal without gallop, murmur, click, rub or other extra sounds. Abdomen:  Bowel sounds positive,abdomen soft and non-tender without masses, organomegaly or hernias noted. no renal bruits  Msk:  No deformity or scoliosis noted of thoracic or lumbar spine.  diffuse changes of OA bunions noted  Pulses:  R and L carotid,radial,femoral,dorsalis pedis and posterior tibial pulses are full and equal bilaterally Extremities:  No clubbing, cyanosis, edema, or deformity noted with normal full range of motion of all joints.   Neurologic:  sensation intact to light touch, gait normal, and DTRs symmetrical and normal.   Skin:  Intact without suspicious lesions or rashes Cervical Nodes:  No lymphadenopathy noted Axillary Nodes:  No palpable lymphadenopathy Inguinal Nodes:  No significant adenopathy Psych:  normal affect, talkative and pleasant    Impression & Recommendations:  Problem # 1:  POSTNASAL DRIP SYNDROME (ICD-784.91) Assessment Deteriorated  refilled her flonase- adv to start  back update if not improved or if facial pain  Orders: Prescription Created Electronically [phone])  Problem # 2:  Hx of HYPERCHOLESTEROLEMIA (ICD-272.0) Assessment: Deteriorated  pt declines chol med and stated she would not change diet at this age understands risks of high chol rev labs with pt   Labs Reviewed: SGOT: 16 (12/05/2010)   SGPT: 11 (12/05/2010)   HDL:46.20 (12/05/2010)  Chol:247 (12/05/2010)  Trig:144.0 (12/05/2010)  Problem # 3:  OSTEOARTHRITIS (ICD-715.90) Assessment: Unchanged with large bunion in R foot may consider ref in future- will call Her updated medication list for this problem includes:    Tylenol Extra Strength 500 Mg Tabs (Acetaminophen) .Marland Kitchen... 1-3 by mouth as needed  Problem # 4:  Preventive Health Care (ICD-V70.0) Assessment: Comment Only will get flu shot today at [facility] check on pneumovax considering shingles vaccine  Complete Medication List: 1)  Proair Hfa 108 (90 Base) Mcg/act Aers (  Albuterol sulfate) .... 2 puffs up to every 4 hours as needed wheezing 2)  Tums  .Marland Kitchen.. 1-2 by mouth as needed 3)  Tylenol Extra Strength 500 Mg Tabs (Acetaminophen) .Marland Kitchen.. 1-3 by mouth as needed 4)  Prevacid 30 Mg Cpdr (Lansoprazole) .Marland Kitchen.. 1 by mouth once daily 5)  Amitiza 8 Mcg Caps (Lubiprostone) .... Take 1 tablet by mouth two times a day as needed 6)  Xalatan 0.005 % Soln (Latanoprost) .Marland Kitchen.. 1 drop in each eye at bedtime 7)  Healthy Colon Caps (Probiotic product) .... One daily as needed 8)  Elmiron 100 Mg Caps (Pentosan polysulfate sodium) .... Take one cap twice a day as needed 9)  Earache Relief Soln (Homeopathic products) .... Otc as directed. 10)  Flonase 50 Mcg/act Susp (Fluticasone propionate) .... 2 sprays in each nostril once daily as needed  Patient Instructions: 1)  go ahead and get flu shot at [facility]  2)  please pull chart to see when last pneumovax was and let me know -- thanks  3)  call your insurance about coverage of shingles vaccine and call  us if you want it  4)  the current recommendation for calcium intake is 1200-1500 mg daily with 1000 IU of vitamin D  5)  please send for dexa from [facility] center in oct  6)  think about whether you want to see a doctor about your foot pain and bunions  7)  you can raise your HDL (good cholesterol) by increasing exercise and eating omega 3 fatty acid supplement like fish oil or flax seed oil over the counter 8)  you can lower LDL (bad cholesterol) by limiting saturated fats in diet like red meat, fried foods, egg yolks, fatty breakfast meats, high fat dairy products and shellfish  Prescriptions: FLONASE 50 MCG/ACT SUSP (FLUTICASONE PROPIONATE) 2 sprays in each nostril once daily as needed  #1 mdi x 11   Entered and Authorized by:   Judith Part MD   Signed by:   Judith Part MD on 12/13/2010   Method used:   Electronically to        [facility]* (retail)       [address]       [place], [place]  [address]       Ph: [phone]       Fax: [phone]   RxID:   [id number]    Orders Added: 1)  Prescription Created Electronically [[id number]] 2)  Est. Patient Level IV [02725]    Current Allergies (reviewed today): ! ZOLOFT (SERTRALINE HCL) ! AUGMENTIN * CRESTOR ADVIL * OMEPREZOLE   Preventive Care Screening     pelvic exam without pap gyn -- oct 2011 no longer gets mammograms

## 2011-01-18 NOTE — Progress Notes (Signed)
----   Converted from flag ---- ---- 12/04/2010 1:34 PM, Judith Part MD wrote: please check lipid/ast/alt/renal / cbc with diff for 272 and GERD thanks  ---- 12/04/2010 10:38 AM, Liane Comber CMA (AAMA) wrote: Lab orders please! Good Morning! This pt is scheduled for cpx labs Tuesday, which labs to draw and dx codes to use? Thanks Tasha ------------------------------

## 2011-03-04 LAB — POCT I-STAT, CHEM 8
BUN: 18 mg/dL (ref 6–23)
Chloride: 109 mEq/L (ref 96–112)
Creatinine, Ser: 0.9 mg/dL (ref 0.4–1.2)
Glucose, Bld: 106 mg/dL — ABNORMAL HIGH (ref 70–99)
Sodium: 140 mEq/L (ref 135–145)

## 2011-03-12 ENCOUNTER — Inpatient Hospital Stay (INDEPENDENT_AMBULATORY_CARE_PROVIDER_SITE_OTHER)
Admission: RE | Admit: 2011-03-12 | Discharge: 2011-03-12 | Disposition: A | Discharge status: Home / Self Care | Payer: Self-pay | Source: Ambulatory Visit | Attending: Family Medicine | Admitting: Family Medicine

## 2011-03-12 DIAGNOSIS — J019 Acute sinusitis, unspecified: Secondary | ICD-10-CM

## 2011-03-20 ENCOUNTER — Other Ambulatory Visit: Payer: Self-pay | Admitting: *Deleted

## 2011-03-20 MED ORDER — LANSOPRAZOLE 30 MG PO CPDR: 30.0000 mg | DELAYED_RELEASE_CAPSULE | Freq: Every day | ORAL | Status: AC

## 2011-03-20 NOTE — Telephone Encounter (Signed)
Px written - printed

## 2011-03-20 NOTE — Telephone Encounter (Signed)
Patient notified as instructed by telephone. Prescription left at front desk.  

## 2011-03-22 ENCOUNTER — Telehealth: Payer: Self-pay | Admitting: *Deleted

## 2011-03-22 NOTE — Telephone Encounter (Signed)
Handicapped placard is on your desk to be filled out.

## 2011-03-25 LAB — PROTIME-INR: INR: 1 (ref 0.00–1.49)

## 2011-03-25 LAB — CBC
HCT: 36.3 % (ref 36.0–46.0)
HCT: 41.8 % (ref 36.0–46.0)
Hemoglobin: 13.9 g/dL (ref 12.0–15.0)
MCHC: 33.8 g/dL (ref 30.0–36.0)
MCV: 90.9 fL (ref 78.0–100.0)
Platelets: 197 10*3/uL (ref 150–400)
Platelets: 206 10*3/uL (ref 150–400)
RBC: 3.99 MIL/uL (ref 3.87–5.11)
RBC: 4.46 MIL/uL (ref 3.87–5.11)
RBC: 4.64 MIL/uL (ref 3.87–5.11)
WBC: 7.4 10*3/uL (ref 4.0–10.5)
WBC: 7.7 10*3/uL (ref 4.0–10.5)

## 2011-03-25 LAB — CK TOTAL AND CKMB (NOT AT ARMC)
Relative Index: INVALID (ref 0.0–2.5)
Total CK: 46 U/L (ref 7–177)

## 2011-03-25 LAB — CARDIAC PANEL(CRET KIN+CKTOT+MB+TROPI)
CK, MB: 1.1 ng/mL (ref 0.3–4.0)
CK, MB: 1.4 ng/mL (ref 0.3–4.0)
Relative Index: INVALID (ref 0.0–2.5)
Troponin I: 0.01 ng/mL (ref 0.00–0.06)
Troponin I: 0.01 ng/mL (ref 0.00–0.06)
Troponin I: 0.01 ng/mL (ref 0.00–0.06)

## 2011-03-25 LAB — BASIC METABOLIC PANEL
BUN: 19 mg/dL (ref 6–23)
CO2: 28 mEq/L (ref 19–32)
Calcium: 9 mg/dL (ref 8.4–10.5)
Chloride: 107 mEq/L (ref 96–112)
Creatinine, Ser: 0.79 mg/dL (ref 0.4–1.2)
Creatinine, Ser: 0.89 mg/dL (ref 0.4–1.2)
GFR calc Af Amer: >60 mL/min (ref >60)
GFR calc non Af Amer: >60 mL/min (ref >60)
Potassium: 3.8 mEq/L (ref 3.5–5.1)
Sodium: 141 mEq/L (ref 135–145)

## 2011-03-25 LAB — URINALYSIS, ROUTINE W REFLEX MICROSCOPIC
Bilirubin Urine: NEGATIVE
Glucose, UA: NEGATIVE mg/dL
Nitrite: NEGATIVE
Protein, ur: NEGATIVE mg/dL
pH: 6.5 (ref 5.0–8.0)

## 2011-03-25 LAB — BRAIN NATRIURETIC PEPTIDE: Pro B Natriuretic peptide (BNP): 70 pg/mL (ref 0.0–100.0)

## 2011-03-25 LAB — POCT CARDIAC MARKERS: Myoglobin, poc: 66.8 ng/mL (ref 12–200)

## 2011-03-25 LAB — URINE CULTURE

## 2011-03-25 LAB — URINE MICROSCOPIC-ADD ON

## 2011-03-25 LAB — TROPONIN I: Troponin I: 0.01 ng/mL (ref 0.00–0.06)

## 2011-03-25 LAB — POCT I-STAT, CHEM 8
Chloride: 110 mEq/L (ref 96–112)
TCO2: 23 mmol/L (ref 0–100)

## 2011-03-25 LAB — LIPID PANEL
HDL: 36 mg/dL — ABNORMAL LOW (ref >39)
LDL Cholesterol: 129 mg/dL — ABNORMAL HIGH (ref 0–99)
Triglycerides: 159 mg/dL — ABNORMAL HIGH (ref <150)

## 2011-03-25 LAB — TSH: TSH: 2.881 u[IU]/mL (ref 0.350–4.500)

## 2011-03-26 NOTE — Telephone Encounter (Signed)
Done and in nurse IN box

## 2011-03-27 ENCOUNTER — Ambulatory Visit (INDEPENDENT_AMBULATORY_CARE_PROVIDER_SITE_OTHER): Payer: Medicare Other | Admitting: Family Medicine

## 2011-03-27 ENCOUNTER — Encounter: Payer: Self-pay | Admitting: Family Medicine

## 2011-03-27 DIAGNOSIS — T7840XA Allergy, unspecified, initial encounter: Secondary | ICD-10-CM

## 2011-03-27 DIAGNOSIS — J329 Chronic sinusitis, unspecified: Secondary | ICD-10-CM | POA: Insufficient documentation

## 2011-03-27 DIAGNOSIS — J45909 Unspecified asthma, uncomplicated: Secondary | ICD-10-CM

## 2011-03-27 MED ORDER — ALBUTEROL SULFATE HFA 108 (90 BASE) MCG/ACT IN AERS: 2.0000 | INHALATION_SPRAY | RESPIRATORY_TRACT | Status: DC | PRN

## 2011-03-27 MED ORDER — LEVOFLOXACIN 500 MG PO TABS: 500.0000 mg | ORAL_TABLET | Freq: Every day | ORAL | Status: AC

## 2011-03-27 NOTE — Patient Instructions (Signed)
Take the levaquin for sinus infection -- update me if any side effects Drink lots of water Continue flonase and zyrtec  Avoid pollen while it is high Update me if not feeling better after the antibiotic

## 2011-03-27 NOTE — Telephone Encounter (Signed)
Ashlee Reyes spoke with family to let them know handicapped form is ready for pick up at front desk.

## 2011-03-27 NOTE — Progress Notes (Signed)
Subjective:    Patient ID: Ashlee Reyes, female    DOB: 1921-06-14, 77 y.o.   MRN: [id number]  HPI Here for acute visit for upper resp symptoms   Was at an urgent care 2 weeks ago -- at [facility]  Was seen with sinus infection --- and treated her with a zpak  Is some better but not entirely   Headache -- whole head and in ears -- especially over her eyes  This only improved a little and is a pressure sensation    Using albuterol for allergies and asthma  With the pollen , both are bothering her more Uses albuterol mdi at least once daily Wheeze is not severe  Is having sinus drainage-- - cannot get a lot out Post nasal drip  Sneezing- some drainage clear and some with color   Pressure in ears with some intermittent dizziness-- feels like she is spinning -- ringing in ears   Throat is sore - hoarse and tonsils feel swollen   Prod cough with green mucous (nl pulse ox 96% on ra today) No chest pain or tenderness   Past Medical History  Diagnosis Date  . Osteoarthritis     spine/spinal stenosis  . GERD (gastroesophageal reflux disease)   . Hemorrhoids   . IC (irritable colon)     DMSO in past  . Allergy     allergic rhinitis  . IBS (irritable bowel syndrome)   . Diverticulosis   . CAD (coronary artery disease) 07/2009    mod by cath 08/10  . AAA (abdominal aortic aneurysm)     followed by Dr Darrick Penna. Last u/s07/10 stable AAA with largest measurement 3.97 x 4.02cm.  . Cancer     basal cell cancer - face.  . Osteopenia 2011    Past Surgical History  Procedure Date  . Right colectomy     benign  . Abdominal hysterectomy     partial not cancer  . Cystocele repair   . Rectocele repair   . Hernia repair 11/1997    umbilical hernia  . Esophagogastroduodenoscopy 09/1998    erosive esphagitis ? stricture treated 12/2002  . Skin lesion excision 2011    basal cell skin lesion removed.  Marland Kitchen Refractive surgery     for film after cataract surgery.    History   Social  History  . Marital Status: Married    Spouse Name: N/A    Number of Children: N/A  . Years of Education: N/A   Occupational History  . Not on file.   Social History Main Topics  . Smoking status: Never Smoker   . Smokeless tobacco: Not on file  . Alcohol Use: Not on file  . Drug Use: Not on file  . Sexually Active: Not on file   Other Topics Concern  . Not on file   Social History Narrative  . No narrative on file    Family History  Problem Relation Age of Onset  . Heart disease Mother     deceased age 19 heart attack.  Marland Kitchen Heart disease Father     age 57 heart attack       Review of Systems  Constitutional: Positive for fatigue. Negative for fever, chills and unexpected weight change.  HENT: Positive for ear pain, congestion, sore throat, rhinorrhea and sinus pressure. Negative for nosebleeds, trouble swallowing and ear discharge.   Eyes: Positive for itching. Negative for pain, discharge and visual disturbance.  Respiratory: Positive for cough and wheezing. Negative  for chest tightness.   Cardiovascular: Negative for chest pain, palpitations and leg swelling.  Gastrointestinal: Negative for nausea, vomiting and abdominal pain.  Skin: Negative for color change and rash.  Neurological: Positive for headaches. Negative for syncope and numbness.  Hematological: Does not bruise/bleed easily.  Psychiatric/Behavioral: Negative for dysphoric mood.       Objective:   Physical Exam  Constitutional: She appears well-developed and well-nourished. No distress.  HENT:  Head: Normocephalic and atraumatic.       Tender ethmoid and maxillary sinuses bilat    Eyes: Conjunctivae and EOM are normal. Pupils are equal, round, and reactive to light. Right eye exhibits no discharge. Left eye exhibits no discharge.  Neck: Normal range of motion. Neck supple. No JVD present.  Cardiovascular: Normal rate, regular rhythm and normal heart sounds.   Pulmonary/Chest: Effort normal. No  stridor. No respiratory distress. She has wheezes. She has no rales. She exhibits no tenderness.       Mild wheeze on forced exp only  Lymphadenopathy:    She has no cervical adenopathy.  Neurological: She has normal reflexes. No cranial nerve deficit.  Skin: Skin is warm and dry. No rash noted. She is not diaphoretic.  Psychiatric: She has a normal mood and affect.          Assessment & Plan:

## 2011-03-27 NOTE — Assessment & Plan Note (Signed)
Acute= and partially tx with zpak In light of continued facial pain - cover with levaquin (cautions disc) -- pt is all to pcn  Disc sympt care Also continue all meds Update if not improved in 7-10 d or if worse

## 2011-03-27 NOTE — Assessment & Plan Note (Signed)
Pollen allergies at seasonal peak  Pt urged to take her zyrtec and flonase regularly Will tx sinus infection Disc avoiding pollen Also refilled albuterol for occ wheeze

## 2011-03-28 NOTE — Assessment & Plan Note (Signed)
Worse with bad pollen and current sinus infection Will continue albuterol which works well If worse or persistant will disc mt therapy Pt will update

## 2011-04-16 ENCOUNTER — Other Ambulatory Visit: Payer: Self-pay | Admitting: *Deleted

## 2011-04-16 MED ORDER — ALBUTEROL SULFATE HFA 108 (90 BASE) MCG/ACT IN AERS: 2.0000 | INHALATION_SPRAY | RESPIRATORY_TRACT | Status: DC | PRN

## 2011-05-01 NOTE — Assessment & Plan Note (Signed)
OFFICE VISIT   Galiano, LU E  DOB:  1921-05-24                                       09/28/2009  [id number]#:[id number]   CHIEF COMPLAINT:  Abdominal pain.   HISTORY OF PRESENT ILLNESS:  The patient is an 77 year old female who  has a known history of abdominal aortic aneurysm.  She was last seen in  July of 2009.  At that time her aneurysm was 3.9 cm in diameter.  She  had an ultrasound in July of 2010 which showed the aneurysm was 4.0 cm  in diameter.  She is referred for evaluation today after a recent CT  scan evaluation for abdominal pain and constipation showed that her  aneurysm is 4.5 cm in diameter.  The CT scan was performed 09/21/2009.  Films were reviewed today and this showed no evidence of inflammatory  aneurysm and no evidence of rupture.   The patient has had chronic abdominal pain for several years.  This is  followed by Dr. Ewing Schlein.  Her pain is intermittent in nature and colicky.  It occurs almost on a daily basis.  She has also exacerbations and  remissions of constipation.  She also has a history of diverticulosis.  She states the pain can frequently be brought on by eating.  However,  she does not have food fear.  She does not have symptoms classically  associated with intestinal angina.  She has had some presyncopal  episodes one to two per week over the last 1-2 years related to this as  well.  She denies significant back pain.   PHYSICAL EXAM:  Vital signs:  Blood pressure is 130/76 in the left arm,  pulse is 69 and regular, respirations 16.  Abdomen:  Is soft, nontender,  nondistended with no significant palpable pulsatile mass.  Extremities:  She has 2+ femoral pulses bilaterally.   The patient has had no significant growth of her aneurysm from July of  2009 to July of 2010 by ultrasounds in our office.  She did have a CT  scan in October which showed the aneurysm was 4.5 cm.  This is a 5 mm  difference but with different imaging  modalities this is probably within  the limits of no significant change.  She has chronic abdominal pain  which I do not believe is related to her aneurysm.  Since there is some  discrepancy between the ultrasounds and the CT scan I believe the best  option would be to repeat her ultrasound in three months' time.  If her  abdominal aortic ultrasound is stable in size at that time and still  less than 5.5 cm we will continue observation.  The patient will return  for followup in 3 months.  If she has any significant change in her  abdominal pain or increase in her back pain she will let any emergency  room know that she has a history of abdominal aortic aneurysm.   Janetta Hora. Fields, MD  Electronically Signed   CEF/MEDQ  D:  09/29/2009  T:  09/29/2009  Job:  [id number]   cc:   Petra Kuba, M.D.  Marne A. Milinda Antis, MD

## 2011-05-01 NOTE — Assessment & Plan Note (Signed)
OFFICE VISIT   Ashlee Reyes, Ashlee Reyes  DOB:  1921-03-16                                       07/14/2008  EAVWU#:[id number]   The patient is an 77 year old female who has had recent chronic back  pain and some mild abdominal pain.  She has a long history of bladder  problems that has been followed by Dr. Darvin Neighbours.  Part of her  evaluation on a recent office visit included an ultrasound and CT scan  of the abdomen, which showed a small abdominal aortic aneurysm.  She  states that she has had constant back pain for several weeks.  She also  has some urinary frequency.  She also has had some constipation recently  but stated that she had a normal bowel movement today.  She denies any  fever or chills.   She has been followed by Dr. Earlene Plater in the past for interstitial  cystitis.   PAST SURGICAL HISTORY:  Remarkable for bowel resection for a benign  tumor in 1997.  She has also had a hysterectomy, a bladder suspension,  and a rectocele repair.   PAST MEDICAL HISTORY:  Remarkable for mildly elevated cholesterol.  She  denies history diabetes, hypertension or coronary artery disease.   MEDICATIONS:  Include Prevacid 30 mg once a day, dicyclomine 10 mg as  needed 1 to 2 times daily, Amitiza 8 mcg 1 to 2 times daily, Hydrocodone  500/5 every 4-6 hours p.r.n., amitriptyline 25 mg nightly, Activia  yogurt over-the-counter.   She has no known drug allergies.   FAMILY HISTORY:  Unremarkable.   SOCIAL HISTORY:  She is married and has 5 children, is a Futures trader.  She  is a nonsmoker and non-consumer of alcohol.   REVIEW OF SYSTEMS:  CONSTITUTIONAL:  She has had some recent weight  loss.  CARDIAC:  She has occasional chest pain.  PULMONARY:  She has mild asthma.  GI:  She has had episodes of diverticulitis in the past.  She has  chronic abdominal pain.  She has also had a history of hiatal hernia,  reflux and some dysphagia.  She has intermittent episodes of  diarrhea,  constipation and dark stool.  She has had problems with this for several  years on all these symptoms.  RENAL:  She has some dysuria and urinary frequency.  VASCULAR:  She has some pain in her legs with walking.  NEUROLOGIC:  She has occasional dizziness and headaches.  ORTHOPEDIC:  She has multiple joint arthritis pain.  PSYCHIATRIC:  She has mild depression and anxiety.  ENT and hematologic review of systems are negative.   PHYSICAL EXAM:  Blood pressure is 139/80 in the left arm, pulse is 83  and regular.  HEENT is unremarkable.  Neck has 2+ carotid pulses with a  faint left carotid bruit.  Chest:  Clear to auscultation.  Cardiac:  Exam is regular rate and rhythm without murmur.  Abdomen is soft,  nontender, nondistended with no masses.  She has a large diastasis  recti.  She has hyperactive bowel sounds.  She has 2+ femoral and  popliteal pulses bilaterally.  She has 2+ dorsalis pedis and posterior  tibial pulses in the left foot.  She has 2+ dorsalis pedis pulse in the  right foot with absent posterior tibial pulse.   She had a CT scan of  the abdomen and pelvis performed at [facility] which shows a 3.9 cm infrarenal abdominal aortic aneurysm with  no evidence of rupture.  There were multiple renal cysts.  There was  sigmoid diverticulosis.  There was also abundant stool throughout colon.   As far as this patient's aneurysm is concerned, it is still fairly small  and risk of rupture is quite low.  I do not believe this represents a  symptomatic aneurysm.  I believe that we should follow her with serial  ultrasound exam.  We have scheduled her for a repeat abdominal aortic  ultrasound in 6 months' time.  She also did have a faint left carotid  bruit and, at her next office visit, we will also schedule her for a  carotid duplex exam.  If her back pain and abdominal pain become worse,  I have told that she should let any emergency room know that she does  have an  abdominal aneurysm.  However, again, I did reassure her that the  risk of aneurysm rupture should be extremely with risk of rupture less  than 1% per year for an aneurysm less than 5.5 cm.  She will follow up  in 6 months.   Janetta Hora. Fields, MD  Electronically Signed   CEF/MEDQ  D:  07/15/2008  T:  07/15/2008  Job:  [id number]   cc:   Windy Fast L. Earlene Plater, M.D.  Marne A. Milinda Antis, MD
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
## 2011-05-01 NOTE — Procedures (Signed)
DUPLEX ULTRASOUND OF ABDOMINAL AORTA   INDICATION:  Followup evaluation of known abdominal aortic aneurysm.   HISTORY:  Diabetes:  No.  Cardiac:  No.  Hypertension:  No.  Smoking:  No.  Connective Tissue Disorder:  Family History:  Previous Surgery:   DUPLEX EXAM:         AP (cm)                   TRANSVERSE (cm)  Proximal             1.3 cm                    1.3 cm  Mid                  4.0 cm                    3.9 cm  Distal               3.5 cm                    3.5 cm  Right Iliac          0.8 cm                    0.9 cm  Left Iliac           1.0 cm                    1.1 cm   PREVIOUS:  Date:  07/12/2008 by CT  AP:  4 cm  TRANSVERSE:   IMPRESSION:  Abdominal aortic aneurysm measurements are stable compared  to previous study.   ___________________________________________  Janetta Hora Fields, MD   MC/MEDQ  D:  01/13/2009  T:  01/13/2009  Job:  [id number]

## 2011-05-01 NOTE — Procedures (Signed)
DUPLEX ULTRASOUND OF ABDOMINAL AORTA   INDICATION:  Followup abdominal aortic aneurysm.   HISTORY:  Diabetes:  no  Cardiac:  no  Hypertension:  no  Smoking:  no  Connective Tissue Disorder:  Family History:  no  Previous Surgery:  No   DUPLEX EXAM:         AP (cm)                   TRANSVERSE (cm)  Proximal             2.16 cm                   2.05 cm  Mid                  2.68 cm                   2.9 cm  Distal               4.22 cm                   4.30 cm  Right Iliac          Not visualized  Left Iliac           No visualized   PREVIOUS:  Date: 09/21/2009 (CT)  AP:  4.5  TRANSVERSE:  4.2   IMPRESSION:  1. Stable abdominal aortic aneurysm when compared to previous CT,      measuring today 4.22 cm X 4.30 cm.  2. Excessive bowel gas in iliac region limited visualization of common      iliac arteries.   ___________________________________________  Janetta Hora Fields, MD   AS/MEDQ  D:  01/11/2010  T:  01/11/2010  Job:  [id number]

## 2011-05-01 NOTE — Procedures (Signed)
DUPLEX ULTRASOUND OF ABDOMINAL AORTA   INDICATION:  Followup abdominal aortic aneurysm.   HISTORY:  Diabetes:  No.  Cardiac:  No.  Hypertension:  No.  Smoking:  No.  Connective Tissue Disorder:  Family History:  No.  Previous Surgery:  No.   DUPLEX EXAM:         AP (cm)                   TRANSVERSE (cm)  Proximal             1.57 cm                   1.57 cm  Mid                  1.60 cm                   1.74 cm  Distal               3.97 cm                   4.02 cm  Right Iliac          Not visualized            cm  Left Iliac           Not visualized            cm   PREVIOUS:  Date:  01/13/2009  AP:  4.0  TRANSVERSE:  3.9   IMPRESSION:  1. Stable abdominal aortic aneurysm with largest measurement of 3.97      cm x 4.02 cm.  2. Unable to visualize common iliac arteries due to bowel gas and      acoustic shadow.   ___________________________________________  Janetta Hora Fields, MD   AS/MEDQ  D:  07/13/2009  T:  07/13/2009  Job:  [id number]

## 2011-05-01 NOTE — Procedures (Signed)
DUPLEX ULTRASOUND OF ABDOMINAL AORTA   INDICATION:  Followup abdominal aortic aneurysm.   HISTORY:  Diabetes:  No.  Cardiac:  No.  Hypertension:  No.  Smoking:  No.  Connective Tissue Disorder:  Family History:  No.  Previous Surgery:  No.   DUPLEX EXAM:         AP (cm)                   TRANSVERSE (cm)  Proximal             2.22 cm                   2.22 cm  Mid                  1.41 cm                   1.65 cm  Distal               4.01 cm                   4.30 cm  Right Iliac          Not well visualized       cm  Left Iliac           Not well visualized       cm   PREVIOUS:  Date: 01/11/2010  AP:  4.22  TRANSVERSE:  4.30   IMPRESSION:  1. Stable abdominal aortic aneurysm when compared to previous scan,      measuring today 4.01 cm x 4.30 cm.  2. Excessive bowel gas in iliac region limited visualization of common      iliac arteries.   ___________________________________________  Janetta Hora Fields, MD   EM/MEDQ  D:  08/09/2010  T:  08/09/2010  Job:  [id number]

## 2011-05-01 NOTE — H&P (Signed)
NAMEELSI, Ashlee Reyes NO.:  [id number]   MEDICAL RECORD NO.:  [id number]          PATIENT TYPE:  OBV   LOCATION:  4707                         FACILITY:  [facility]   PHYSICIAN:  [name], M.D.       DATE OF BIRTH:  10-11-1921   DATE OF ADMISSION:  07/14/2009  DATE OF DISCHARGE:                              HISTORY & PHYSICAL   PRIMARY CARE PHYSICIAN:  Marne A. Tower, MD   CHIEF COMPLAINT:  Chest pain.   HISTORY OF PRESENT ILLNESS:  Ashlee Reyes is an 77 year old woman without  any significant medical problems who presented to her primary care  physician's office with complaints of on and off chest pain while  exerting herself.  Says the episodes were brief, were for a couple of  minutes.  They were pressure-like, without any radiation or significant  association with shortness of breath.  The patient denies any sweating.  She was wondering if the episodes are related to gastroesophageal reflux  disease.  The patient had a Cardiolite in January 2009 which indicated  low-risk type picture.  She also has been having hyperlipidemia for  which she has been refusing statin saying that this made her weak.   PAST MEDICAL HISTORY:  1. Diverticulosis.  2. Depression.  3. Hyperlipidemia.  4. Gastroesophageal reflux disease.  5. Asthma.  6. Interstitial cystitis.  7. Osteoarthritis.  8. Hysterectomy.  9. Partial colonic resection for a large benign polyp.   HOME MEDICATIONS:  Albuterol, aspirin, Aleve, Tylenol, Tums, and  Prevacid.   ALLERGIES:  ZOLOFT, AUGMENTIN, CRESTOR, ADVIL, AND OMEPRAZOLE.   SOCIAL HISTORY:  [place]  She has never smoked,  drunk, or used any illicit drugs.  She is married.   FAMILY HISTORY:  The patient's mother died at age 36 of heart attack,  the patient's father at age 45 from a heart attack.  She had 2 brothers,  one died from a stroke and another one from a heart attack.   REVIEW OF SYSTEMS:  As per HPI.  Also  positive for some frequent  urination and occasional abdominal pains.  All other systems per HPI are  negative.   PHYSICAL EXAMINATION:  VITAL SIGNS:  Upon admission, temperature is  97.9, blood pressure is 142/61, heart rate 73, respirations 18,  saturation 99% on room air.  GENERAL:  General appearance is one of a pleasant, calm lady in no acute  distress, alert, oriented to place, person, and time.  HEENT:  Head, normocephalic and atraumatic.  Eyes, pupils equal, round,  and react to light and accommodation.  Extraocular movements intact.  Throat clear.  NECK:  Supple.  No JVD.  CHEST:  Clear to auscultation.  No wheezes, rhonchi, or crackles.  HEART:  Regular rate and rhythm without murmurs, rubs, or gallops.  ABDOMEN:  Slightly distended, soft.  Bowel sounds are present.  No  palpable hepatomegaly.  EXTREMITIES:  Lower extremity without edema.  SKIN:  Warm and dry without any suspicious-looking rashes.   LABORATORY VALUES:  On admission, sodium 141, potassium  4, chloride 110,  bicarbonate is 23, BUN 19, creatinine 0.8, glucose 87, white blood cell  count is 6.8, hemoglobin 14.1, platelet count 206, troponin-I less than  0.05.  Urinalysis positive leukocyte esterase and many bacteria.  Chest  x-ray shows cardiomegaly and no acute cardiopulmonary disease.  EKG  shows questionable of an old anterior infarct.   ASSESSMENT AND PLAN:  1. This is an 77 year old woman presenting with symptoms of typical      angina.  She does not have diagnosed coronary artery disease.  Her      pretest probability of coronary artery disease is high due to      hypertension, hyperlipidemia, and age.  I would place her on      observation on telemetry floor, cycle cardiac enzymes, and repeat      an EKG if she has further angina.  For now, we will just continue      her on aspirin and proton pump inhibitor and I will discontinue the      nonsteroidal antiinflammatory drug.  I will also try the patient  on      a low dose metoprolol.  If the patient rules out for myocardial      infarction, we are probably going to go ahead and repeat Cardiolite      or if she has any worrisome features, we will proceed with the      cardiology consult for a diagnostic cardiac cath.  2. Severe esophageal reflux disease.  I will take the patient off the      NSAID, continue the proton pump inhibitor and see if it does      improve the symptoms.  If she continues to have symptoms, she will      need a gastroenterology consul for a repeat EGD.  3. Urinary tract infection.  The culture has been sent out.  The      patient will be started on Rocephin intravenously.  4. DVT prophylaxis will be done using Lovenox.      [name], M.D.  Electronically Signed     SL/MEDQ  D:  07/14/2009  T:  07/15/2009  Job:  [id number]   cc:   Marne A. Milinda Antis, MD

## 2011-05-01 NOTE — Discharge Summary (Signed)
Ashlee Reyes, Ashlee Reyes                   ACCOUNT NO.:  [id number]   MEDICAL RECORD NO.:  [id number]          PATIENT TYPE:  INP   LOCATION:  4707                         FACILITY:  [facility]   PHYSICIAN:  Valerie A. Felicity Coyer, MDDATE OF BIRTH:  1921-04-15   DATE OF ADMISSION:  07/14/2009  DATE OF DISCHARGE:  07/16/2009                               DISCHARGE SUMMARY   DISCHARGE DIAGNOSES:  1. Chest pain, negative telemetry and cardiac enzymes, but status post      cardiac catheterization due to high risk of moderate coronary      artery disease identified.  Continue medical management as outlined      below.  2. Dyslipidemia, newly on statin.  Outpatient followup with recheck      fasting lipids 6 weeks.  3. Klebsiella urinary tract infection identified by culture.  Continue      Cipro x7 days total treatment.  4. Severe gastroesophageal reflux disease.  Continue home proton pump      inhibitor, noting possible exacerbation with aspirin addition for      chest pain.  5. Abdominal aortic aneurysm under 5 cm by ultrasound.  Follow up with      Dr. Darrick Penna as ongoing prior to admission.  6. Depression history.  7. Asthma history.  No acute exacerbation.   DISCHARGE MEDICATIONS:  1. Aspirin 81 mg daily.  2. Toprol-XL 25 mg daily.  3. Zocor 20 mg p.o. nightly.  4. Cipro 250 p.o. b.i.d. x7 days.  5. Prevacid 30 mg daily.  6. Tums 500 mg 1-2 b.i.d.   The patient also has p.r.n. albuterol inhaler 1-2 puffs q.4 h. p.r.n.  shortness of breath and to p.r.n. Tylenol Extra Strength 1-2 p.o. q.6 h.  p.r.n. arthritis pain.   DISPOSITION:  The patient is discharged home in medically stable and  improved condition.  She is having no complaints of pain at this time.  Hospital followup will be with cardiologist Dr. Gala Romney to call for an  appointment in the next 6 weeks, phone number provided.  Also primary  care physician Dr. Milinda Antis to call for appointment in the next 2 weeks and  follow up with Dr.  Darrick Penna vascular surgeon as scheduled prior to  admission for her AAA.   HOSPITAL COURSE BY PROBLEM:  1. Chest pain.  The patient is an 77 year old woman with multiple risk      factors for coronary artery disease, who came to the emergency room      after evaluation by her primary care physician due to chest pain      with radiation that was typical for angina.  She was sent to the      emergency room for further evaluation where she was referred for      admission to further risk stratify.  Telemetry was negative and      serial cardiac enzymes were negative, and she was seen in      consultation by Cardiology to assist with the appropriate risk      stratification.  It was determined that cardiac cath would  be the      best test and she underwent this on July 15, 2009.  This identified      moderate coronary artery disease, but no other specific      intervention required.  Recommend medical management with statin      low-dose beta-blocker and daily aspirin therapy.  No need for      nitrates per Dr. Jens Som.  This plan has been reviewed with the      patient and daughter on the day of discharge, who will follow up      with Cardiology in the next 6 weeks and call for appointments as      indicated.  2. Klebsiella UTI.  The patient had a positive UA at the time of      admission and was begun on Rocephin for this.  Sensitivities      returned with resistance to ampicillin and given her history of      amoxicillin allergy, it has been determined that we will treat with      7 days of Cipro and place Rocephin.  Prescription has been provided      and outpatient followup will be with primary care physician as      indicated.  3. Other medical issues.  Please see above discharge diagnosis.  For      those problems, continue medical management as ongoing prior to      admission.   Over 30 minutes spent on day of discharge coordination for review of  hospitalization and discharge  planning.      Valerie A. Felicity Coyer, MD  Electronically Signed     VAL/MEDQ  D:  07/16/2009  T:  07/17/2009  Job:  [id number]

## 2011-05-18 HISTORY — PX: SKIN LESION EXCISION: SHX2412

## 2011-06-13 ENCOUNTER — Encounter: Payer: Self-pay | Admitting: Vascular Surgery

## 2011-06-21 ENCOUNTER — Encounter (INDEPENDENT_AMBULATORY_CARE_PROVIDER_SITE_OTHER): Payer: Medicare Other

## 2011-06-21 ENCOUNTER — Ambulatory Visit (INDEPENDENT_AMBULATORY_CARE_PROVIDER_SITE_OTHER): Payer: Medicare Other | Admitting: Vascular Surgery

## 2011-06-21 ENCOUNTER — Encounter: Payer: Self-pay | Admitting: Vascular Surgery

## 2011-06-21 VITALS — BP 139/71 | HR 70

## 2011-06-21 DIAGNOSIS — I714 Abdominal aortic aneurysm, without rupture: Secondary | ICD-10-CM

## 2011-06-21 NOTE — Progress Notes (Signed)
Patient is an 77 year old female returns for followup for an abdominal aortic aneurysm. She was last in October of 2010. Her last aortic ultrasound in our office was in August of 2011. The aneurysm at that time was 4.3 cm. She was referred from [facility] urology after a recent ultrasound showed the aneurysm was 5.1 cm. She denies any abdominal or back pain.  Review of systems no chest pain or shortness of breath  Physical exam:  Chest-clear to auscultation  Cardiac-regular rate and rhythm without murmur  Abdomen-soft nontender nondistended with a palpable pulsatile mass  Extremities 2+ femoral pulses bilaterally  She had an aortic ultrasound in our office today which showed aneurysm diameter was 4.4 cm. This is an exam which I reviewed and interpreted.  Assessment: Abdominal aortic aneurysm 4.4 cm on ultrasound today. The discrepancy between the ultrasounds could be related to angle of the probe and technician. However, if the aneurysm is 5.1 cm in diameter it is still below the threshold I would consider repair at 5.5 cm. It is asymptomatic.  I believe the best option at this point would be to repeat her ultrasound in 6 months time rather than one year if the aneurysm continues to grow would consider a CT angio.

## 2011-06-21 NOTE — Progress Notes (Signed)
F/U on AAA,   Labs today.

## 2011-07-02 NOTE — Procedures (Unsigned)
DUPLEX ULTRASOUND OF ABDOMINAL AORTA  INDICATION:  Follow up AAA.  HISTORY: Diabetes:  No. Cardiac:  No. Hypertension:  No. Smoking:  No. Connective Tissue Disorder: Family History: Previous Surgery:  DUPLEX EXAM:         AP (cm)                   TRANSVERSE (cm) Proximal             2.20 cm                   2.18 cm Mid                  1.33 cm                   1.60 cm Distal               4.36 cm                   4.24 cm Right Iliac          0.81 cm                   0.97 cm Left Iliac           NV                        NV  PREVIOUS:  Date: 08/09/2010  AP:  4.01  TRANSVERSE:  4.3  IMPRESSION:  Stable abdominal aortic aneurysm measuring approximately 4.36 cm X 4.24 cm.  ___________________________________________ Janetta Hora. Fields, MD  LT/MEDQ  D:  06/21/2011  T:  06/21/2011  Job:  [id number]

## 2011-10-10 ENCOUNTER — Other Ambulatory Visit: Payer: Self-pay | Admitting: Cardiology

## 2011-10-10 ENCOUNTER — Ambulatory Visit (INDEPENDENT_AMBULATORY_CARE_PROVIDER_SITE_OTHER): Payer: Medicare Other | Admitting: Family Medicine

## 2011-10-10 ENCOUNTER — Encounter: Payer: Self-pay | Admitting: Family Medicine

## 2011-10-10 DIAGNOSIS — R0989 Other specified symptoms and signs involving the circulatory and respiratory systems: Secondary | ICD-10-CM

## 2011-10-10 DIAGNOSIS — I714 Abdominal aortic aneurysm, without rupture, unspecified: Secondary | ICD-10-CM

## 2011-10-10 DIAGNOSIS — I729 Aneurysm of unspecified site: Secondary | ICD-10-CM

## 2011-10-10 DIAGNOSIS — R079 Chest pain, unspecified: Secondary | ICD-10-CM

## 2011-10-10 NOTE — Progress Notes (Signed)
Subjective:    Patient ID: Ashlee Reyes, female    DOB: 1921/07/24, 77 y.o.   MRN: [id number]  HPI Here with chest pain since this am  Sometimes gets it - and usually goes away quickly (for the past few weeks -- like indigestion) -- within 2-3 hours  Could not sleep well this am -- got up at 5 am -- chest was hurting - ate some cereal and banana  That did not help  Some burping  Pain is under sternum- is a dull sensation- not pressure , not sharp or severe  No radiation to arms or neck  No tingling  Not burning  And no acid in her throat  (but is burping / belching a lot)  Pain is not exertional  No sob or sweating or nausea No numbness or pain or coldness of extremeties      Has hx of CAD non obst with fam hx of cad and high chol (declined aggressive intervention in past) Saw dr Jones Broom in past - with cath showing non obst cad in 2010  alsohas AAA 5.1 cm at last check -- with plan from Dr Darrick Penna to do CT angio if it gets over 5.5 cm  bp is 136/68- other vitals are ok   Patient Active Problem List  Diagnoses  . HYPERCHOLESTEROLEMIA  . DISORDER, DEPRESSIVE NEC  . MIXED HEARING LOSS BILATERAL  . C A D  . REACTIVE AIRWAY DISEASE  . GERD  . DUODENITIS  . HIATAL HERNIA  . DIVERTICULITIS, COLON W/O HEM  . IBS  . INTERSTITIAL CYSTITIS  . OSTEOARTHRITIS  . DEGENERATIVE JOINT DISEASE, LUMBAR SPINE  . POSTNASAL DRIP SYNDROME  . ALLERGY  . HX, URINARY INFECTION  . Chest pain  . Abdominal aortic aneurysm   Past Medical History  Diagnosis Date  . Osteoarthritis     spine/spinal stenosis  . GERD (gastroesophageal reflux disease)   . Hemorrhoids   . IC (irritable colon)     DMSO in past  . Allergy     allergic rhinitis  . IBS (irritable bowel syndrome)   . Diverticulosis   . CAD (coronary artery disease) 07/2009    mod by cath 08/10  . AAA (abdominal aortic aneurysm)     followed by Dr Darrick Penna. Last u/s07/10 stable AAA with largest measurement 3.97 x 4.02cm.  .  Cancer     basal cell cancer - face.  . Osteopenia 2011  . Hyperlipidemia   . Depression   . Asthma    Past Surgical History  Procedure Date  . Right colectomy 1997    benign  . Abdominal hysterectomy     partial not cancer  . Cystocele repair   . Rectocele repair   . Hernia repair 11/1997    umbilical hernia  . Esophagogastroduodenoscopy 09/1998    erosive esphagitis ? stricture treated 12/2002  . Refractive surgery     for film after cataract surgery.  . Skin lesion excision 05/2011    basal cell skin lesion removed.   History  Substance Use Topics  . Smoking status: Never Smoker   . Smokeless tobacco: Not on file  . Alcohol Use: No   Family History  Problem Relation Age of Onset  . Heart disease Mother     deceased age 33 heart attack.  Marland Kitchen Heart disease Father     age 54 heart attack  . Cancer Father   . Stroke Brother    Allergies  Allergen Reactions  .  Ibuprofen     REACTION: GI  . ZOX:[id number] Acid+Aspartame     REACTION: sick  . Rosuvastatin     REACTION: foot pain  . Sertraline Hcl     REACTION: more depressed   Current Outpatient Prescriptions on File Prior to Visit  Medication Sig Dispense Refill  . albuterol (PROAIR HFA) 108 (90 BASE) MCG/ACT inhaler Inhale 2 puffs into the lungs every 4 (four) hours as needed for wheezing. 2 puffs up to every 4 hours as needed for wheezing  1 Inhaler  11  . cetirizine (ZYRTEC) 10 MG tablet OTC as directed.       . fluticasone (FLONASE) 50 MCG/ACT nasal spray 2 sprays by Nasal route daily. As needed.       . lansoprazole (PREVACID) 30 MG capsule Take 1 capsule (30 mg total) by mouth daily.  90 capsule  3  . latanoprost (XALATAN) 0.005 % ophthalmic solution Place 1 drop into both eyes at bedtime.        . polyethylene glycol (MIRALAX / GLYCOLAX) packet Take 17 g by mouth daily.        Marland Kitchen acetaminophen (TYLENOL) 500 MG tablet 1-3 by mouth as needed.       . calcium carbonate (OS-CAL) 1250 MG  chewable tablet 1-2 by mouth as needed.       . Homeopathic Products (EARACHE RELIEF) SOLN OTC as directed.       . pentosan polysulfate (ELMIRON) 100 MG capsule 1 capsule twice a day as needed.       . Probiotic Product (HEALTHY COLON) CAPS One daily as needed.              Review of Systems Review of Systems  Constitutional: Negative for fever, appetite change, fatigue and unexpected weight change.  Eyes: Negative for pain and visual disturbance.  Respiratory: Negative for cough and shortness of breath.   Cardiovascular: Negative for palpitations/sob/ edema/ leg pain or focal swelling  Gastrointestinal: Negative for nausea, diarrhea and constipation. pos for burping / some indigestion at times  Genitourinary: pos for baseline urgency and frequency with IC  Skin: Negative for pallor or rash   Neurological: Negative for weakness, light-headedness, numbness and headaches.  Hematological: Negative for adenopathy. Does not bruise/bleed easily.  Psychiatric/Behavioral: Negative for dysphoric mood. The patient is not nervous/anxious.         Objective:   Physical Exam  Constitutional: She appears well-developed and well-nourished. No distress.       Well appearing elderly female in no distress  HENT:  Head: Normocephalic and atraumatic.  Mouth/Throat: Oropharynx is clear and moist.  Eyes: Conjunctivae and EOM are normal. Pupils are equal, round, and reactive to light. No scleral icterus.  Neck: Normal range of motion. Neck supple. No JVD present. Carotid bruit is not present. Erythema present. No thyromegaly present.  Cardiovascular: Normal rate, normal heart sounds and intact distal pulses.  Exam reveals no gallop and no friction rub.   No murmur heard. Pulmonary/Chest: Effort normal and breath sounds normal. No respiratory distress. She has no wheezes. She exhibits no tenderness.  Abdominal: Soft. She exhibits no distension, no abdominal bruit, no pulsatile midline mass and no mass.  There is no tenderness. There is no rebound and no guarding.  Musculoskeletal: She exhibits no edema and no tenderness.       No palpable cords/ warmth or swelling  Neg homann's   Lymphadenopathy:    She has no cervical adenopathy.  Neurological: She is alert. She has  normal reflexes. No cranial nerve deficit. Coordination normal.  Skin: Skin is warm and dry. No rash noted. No erythema. No pallor.  Psychiatric: She has a normal mood and affect.          Assessment & Plan:

## 2011-10-10 NOTE — Assessment & Plan Note (Signed)
Incidental finding on urology [place] - 5.1 AAA She has seen vascular (Dr Darrick Penna about this )- agreed that if it enlarges to 5.5 cm would need CT angiogram  In light of chest pain will send her for [place] of this to check for change Stable with symmetric pulses today  inst if worse to go to ER and she agreed (daughter as well)

## 2011-10-10 NOTE — Assessment & Plan Note (Addendum)
Atypical cp in elderly female with known nonobst CAD (from cath in 2010) with stable EKG and also known AAA of 5.1 cm  Reviewed findings with her - she was comfortable/ pain free and stable in exam room with nl vitals  Rev EKG which showed no acute changes from previous  Unremarkable exam Made plan to check her AAA by ultrasound and make sure it is not bigger (she sees Dr Darrick Penna for this) inst if worse pain/ or sob/nausea/ dizziness or other symptoms - to go to ER immediately--she and family member agreed to this  She did refuse to go to ER earlier today however  Will update when test returns  In light of burping and gas- also adv her to take an extra prevacid today in case of GERD If no changes - will likely plan f/u with cardiology

## 2011-10-10 NOTE — Patient Instructions (Signed)
See Shirlee Limerick up front to see about scheduling ultrasound or follow up with Dr Darrick Penna  If chest pain suddenly worsens or you get short of breath/ sweaty or nauseated- go to ER Take extra prevacid today

## 2011-10-11 ENCOUNTER — Encounter (INDEPENDENT_AMBULATORY_CARE_PROVIDER_SITE_OTHER): Payer: Medicare Other | Admitting: Cardiology

## 2011-10-11 ENCOUNTER — Other Ambulatory Visit: Payer: Medicare Other

## 2011-10-11 ENCOUNTER — Telehealth: Payer: Self-pay | Admitting: Family Medicine

## 2011-10-11 DIAGNOSIS — I714 Abdominal aortic aneurysm, without rupture: Secondary | ICD-10-CM

## 2011-10-11 DIAGNOSIS — R109 Unspecified abdominal pain: Secondary | ICD-10-CM

## 2011-10-11 DIAGNOSIS — I729 Aneurysm of unspecified site: Secondary | ICD-10-CM

## 2011-10-11 DIAGNOSIS — R0989 Other specified symptoms and signs involving the circulatory and respiratory systems: Secondary | ICD-10-CM

## 2011-10-11 NOTE — Telephone Encounter (Signed)
Message copied by Judy Pimple on Thu Oct 11, 2011  3:40 PM ------      Message from: Burr Medico      Created: Thu Oct 11, 2011 10:53 AM      Regarding: Prelim Aorta report       Increase in AAA size from 4.4 cm x 4.2 cm in 06/2011), now up to 4.75 cm x 4.78 cm.   No evidence of dissection or tear.      (This could be a difference in equipment)

## 2011-10-11 NOTE — Telephone Encounter (Signed)
Left vm for pt to callback 

## 2011-10-11 NOTE — Telephone Encounter (Signed)
Please let pt know that her aneurysm has grown a little but no dissection/ tear- which is very reassuring  When final report comes I will send to Dr Darrick Penna - please ask her how her symptoms (ie: chest pain ) are

## 2011-10-12 NOTE — Telephone Encounter (Signed)
Ashlee Reyes notified as instructed by telephone. Pt is still tender in chest but not like the pain she had on Wednesday. [name] wonders if pt can take Prevacid twice a day for a while to see if that will help also. [name] can be reached on cell [phone] or [phone].Please advise.

## 2011-10-12 NOTE — Telephone Encounter (Signed)
Merrily Brittle notified as instructed by telephone.

## 2011-10-12 NOTE — Telephone Encounter (Signed)
Go ahead and take prevacid bid since that seems to be helping -- update me next week If cp returns - I will get her f/u with cardiology (or if acutely so after hours seek care at ER)

## 2011-10-19 ENCOUNTER — Other Ambulatory Visit: Payer: Self-pay | Admitting: *Deleted

## 2011-10-19 DIAGNOSIS — I714 Abdominal aortic aneurysm, without rupture, unspecified: Secondary | ICD-10-CM

## 2011-12-26 DIAGNOSIS — R300 Dysuria: Secondary | ICD-10-CM | POA: Diagnosis not present

## 2011-12-26 DIAGNOSIS — N3000 Acute cystitis without hematuria: Secondary | ICD-10-CM | POA: Diagnosis not present

## 2011-12-26 DIAGNOSIS — N3010 Interstitial cystitis (chronic) without hematuria: Secondary | ICD-10-CM | POA: Diagnosis not present

## 2012-01-29 DIAGNOSIS — R300 Dysuria: Secondary | ICD-10-CM | POA: Diagnosis not present

## 2012-01-29 DIAGNOSIS — J45901 Unspecified asthma with (acute) exacerbation: Secondary | ICD-10-CM | POA: Diagnosis not present

## 2012-01-29 DIAGNOSIS — N3010 Interstitial cystitis (chronic) without hematuria: Secondary | ICD-10-CM | POA: Diagnosis not present

## 2012-01-30 DIAGNOSIS — L03039 Cellulitis of unspecified toe: Secondary | ICD-10-CM | POA: Diagnosis not present

## 2012-01-30 DIAGNOSIS — M2010 Hallux valgus (acquired), unspecified foot: Secondary | ICD-10-CM | POA: Diagnosis not present

## 2012-02-01 DIAGNOSIS — N3010 Interstitial cystitis (chronic) without hematuria: Secondary | ICD-10-CM | POA: Diagnosis not present

## 2012-02-01 DIAGNOSIS — R300 Dysuria: Secondary | ICD-10-CM | POA: Diagnosis not present

## 2012-02-05 DIAGNOSIS — N3010 Interstitial cystitis (chronic) without hematuria: Secondary | ICD-10-CM | POA: Diagnosis not present

## 2012-02-05 DIAGNOSIS — R300 Dysuria: Secondary | ICD-10-CM | POA: Diagnosis not present

## 2012-02-05 DIAGNOSIS — H4010X0 Unspecified open-angle glaucoma, stage unspecified: Secondary | ICD-10-CM | POA: Diagnosis not present

## 2012-02-07 ENCOUNTER — Other Ambulatory Visit: Payer: Self-pay

## 2012-02-07 NOTE — Telephone Encounter (Signed)
[name] called to ck on status of prior auth for Prevacid. I explained I have not received request for prior auth. [name] said [facility] sent yesterday. I spoke with Neysa Bonito at [facility] and she needed to verify how pt taking prevacid. I verified with [name] once daily. Neysa Bonito said pt just filled on 12/26/11 for 90 day supply; it was too soon to get med; pt did not need prior auth. Linda notified.

## 2012-02-08 DIAGNOSIS — N3010 Interstitial cystitis (chronic) without hematuria: Secondary | ICD-10-CM | POA: Diagnosis not present

## 2012-02-08 DIAGNOSIS — R300 Dysuria: Secondary | ICD-10-CM | POA: Diagnosis not present

## 2012-02-12 DIAGNOSIS — N3010 Interstitial cystitis (chronic) without hematuria: Secondary | ICD-10-CM | POA: Diagnosis not present

## 2012-02-15 DIAGNOSIS — N3010 Interstitial cystitis (chronic) without hematuria: Secondary | ICD-10-CM | POA: Diagnosis not present

## 2012-02-26 DIAGNOSIS — R490 Dysphonia: Secondary | ICD-10-CM | POA: Diagnosis not present

## 2012-02-26 DIAGNOSIS — R1310 Dysphagia, unspecified: Secondary | ICD-10-CM | POA: Diagnosis not present

## 2012-02-26 DIAGNOSIS — R05 Cough: Secondary | ICD-10-CM | POA: Diagnosis not present

## 2012-02-26 DIAGNOSIS — K219 Gastro-esophageal reflux disease without esophagitis: Secondary | ICD-10-CM | POA: Diagnosis not present

## 2012-03-19 DIAGNOSIS — R490 Dysphonia: Secondary | ICD-10-CM | POA: Diagnosis not present

## 2012-03-19 DIAGNOSIS — N3010 Interstitial cystitis (chronic) without hematuria: Secondary | ICD-10-CM | POA: Diagnosis not present

## 2012-03-19 DIAGNOSIS — R279 Unspecified lack of coordination: Secondary | ICD-10-CM | POA: Diagnosis not present

## 2012-03-19 DIAGNOSIS — R1310 Dysphagia, unspecified: Secondary | ICD-10-CM | POA: Diagnosis not present

## 2012-03-19 DIAGNOSIS — R05 Cough: Secondary | ICD-10-CM | POA: Diagnosis not present

## 2012-03-19 DIAGNOSIS — M6281 Muscle weakness (generalized): Secondary | ICD-10-CM | POA: Diagnosis not present

## 2012-03-19 DIAGNOSIS — N3946 Mixed incontinence: Secondary | ICD-10-CM | POA: Diagnosis not present

## 2012-03-19 DIAGNOSIS — K219 Gastro-esophageal reflux disease without esophagitis: Secondary | ICD-10-CM | POA: Diagnosis not present

## 2012-04-08 DIAGNOSIS — R279 Unspecified lack of coordination: Secondary | ICD-10-CM | POA: Diagnosis not present

## 2012-04-08 DIAGNOSIS — R05 Cough: Secondary | ICD-10-CM | POA: Diagnosis not present

## 2012-04-08 DIAGNOSIS — R3915 Urgency of urination: Secondary | ICD-10-CM | POA: Diagnosis not present

## 2012-04-08 DIAGNOSIS — R1310 Dysphagia, unspecified: Secondary | ICD-10-CM | POA: Diagnosis not present

## 2012-04-08 DIAGNOSIS — M6281 Muscle weakness (generalized): Secondary | ICD-10-CM | POA: Diagnosis not present

## 2012-04-08 DIAGNOSIS — R351 Nocturia: Secondary | ICD-10-CM | POA: Diagnosis not present

## 2012-04-08 DIAGNOSIS — K219 Gastro-esophageal reflux disease without esophagitis: Secondary | ICD-10-CM | POA: Diagnosis not present

## 2012-04-08 DIAGNOSIS — R490 Dysphonia: Secondary | ICD-10-CM | POA: Diagnosis not present

## 2012-04-16 ENCOUNTER — Ambulatory Visit: Payer: Medicare Other | Admitting: Neurosurgery

## 2012-04-17 ENCOUNTER — Encounter: Payer: Self-pay | Admitting: Vascular Surgery

## 2012-04-17 ENCOUNTER — Ambulatory Visit (INDEPENDENT_AMBULATORY_CARE_PROVIDER_SITE_OTHER): Payer: Medicare Other | Admitting: Family Medicine

## 2012-04-17 ENCOUNTER — Ambulatory Visit (INDEPENDENT_AMBULATORY_CARE_PROVIDER_SITE_OTHER): Payer: Medicare Other | Admitting: Neurosurgery

## 2012-04-17 ENCOUNTER — Ambulatory Visit (INDEPENDENT_AMBULATORY_CARE_PROVIDER_SITE_OTHER): Payer: Medicare Other | Admitting: Vascular Surgery

## 2012-04-17 ENCOUNTER — Encounter: Payer: Self-pay | Admitting: Neurosurgery

## 2012-04-17 ENCOUNTER — Encounter: Payer: Self-pay | Admitting: Family Medicine

## 2012-04-17 VITALS — BP 131/71 | HR 75 | Resp 16 | Ht 66.0 in | Wt 172.6 lb

## 2012-04-17 VITALS — BP 120/70 | HR 76 | Temp 97.8°F | Ht 66.5 in | Wt 173.8 lb

## 2012-04-17 DIAGNOSIS — W57XXXA Bitten or stung by nonvenomous insect and other nonvenomous arthropods, initial encounter: Secondary | ICD-10-CM

## 2012-04-17 DIAGNOSIS — I714 Abdominal aortic aneurysm, without rupture, unspecified: Secondary | ICD-10-CM

## 2012-04-17 DIAGNOSIS — L03119 Cellulitis of unspecified part of limb: Secondary | ICD-10-CM

## 2012-04-17 DIAGNOSIS — T148 Other injury of unspecified body region: Secondary | ICD-10-CM

## 2012-04-17 DIAGNOSIS — L02419 Cutaneous abscess of limb, unspecified: Secondary | ICD-10-CM | POA: Diagnosis not present

## 2012-04-17 DIAGNOSIS — T148XXA Other injury of unspecified body region, initial encounter: Secondary | ICD-10-CM

## 2012-04-17 MED ORDER — CEPHALEXIN 500 MG PO CAPS: 1000.0000 mg | ORAL_CAPSULE | Freq: Two times a day (BID) | ORAL | Status: DC

## 2012-04-17 NOTE — Progress Notes (Addendum)
[facility] HISTORY AND PHYSICAL   CC: Six-month followup for AAA duplex Referring Physician: Fields  History of Present Illness: 78 year-old female patient with known AAA seen for serial duplex surveillance. Patient reports no unusual abdominal pain or back pain. She does have left hip pain that Dr. August Saucer has worked up as well as Dr. Otelia Sergeant. They do not recommend any kind of intervention. She does have a tick bite on her left leg just below the patella is somewhat red and I've encouraged her to see her primary care doctor about this.    Past Medical History  Diagnosis Date  . Osteoarthritis     spine/spinal stenosis  . GERD (gastroesophageal reflux disease)   . Hemorrhoids   . IC (irritable colon)     DMSO in past  . Allergy     allergic rhinitis  . IBS (irritable bowel syndrome)   . Diverticulosis   . CAD (coronary artery disease) 07/2009    mod by cath 08/10  . AAA (abdominal aortic aneurysm)     followed by Dr Darrick Penna. Last u/s07/10 stable AAA with largest measurement 3.97 x 4.02cm.  . Cancer     basal cell cancer - face.  . Osteopenia 2011  . Hyperlipidemia   . Depression   . Asthma     ROS: [x]  Positive   [ ]  Denies    General: [ ]  Weight loss, [ ]  Fever, [ ]  chills Neurologic: [ ]  Dizziness, [ ]  Blackouts, [ ]  Seizure [ ]  Stroke, [ ]  "Mini stroke", [ ]  Slurred speech, [ ]  Temporary blindness; [ ]  weakness in arms or legs, [ ]  Hoarseness Cardiac: [ ]  Chest pain/pressure, [ ]  Shortness of breath at rest [ ]  Shortness of breath with exertion, [ ]  Atrial fibrillation or irregular heartbeat Vascular: [ ]  Pain in legs with walking, [ ]  Pain in legs at rest, [ ]  Pain in legs at night,  [ ]  Non-healing ulcer, [ ]  Blood clot in vein/DVT,   Pulmonary: [ ]  Home oxygen, [ ]  Productive cough, [ ]  Coughing up blood, [ ]  Asthma,  [ ]  Wheezing Musculoskeletal:  [x ] Arthritis, [x ] Low back pain, [x ] Joint pain Hematologic: [ ]  Easy Bruising, [ ]   Anemia; [ ]  Hepatitis Gastrointestinal: [ ]  Blood in stool, [ ]  Gastroesophageal Reflux/heartburn, [ ]  Trouble swallowing Urinary: [ ]  chronic Kidney disease, [ ]  on HD - [ ]  MWF or [ ]  TTHS, [ ]  Burning with urination, [ ]  Difficulty urinating Skin: [ ]  Rashes, [ ]  Wounds Psychological: [ ]  Anxiety, [ ]  Depression   Social History History  Substance Use Topics  . Smoking status: Never Smoker   . Smokeless tobacco: Not on file  . Alcohol Use: No    Family History Family History  Problem Relation Age of Onset  . Heart disease Mother     deceased age 9 heart attack.  Marland Kitchen Heart disease Father     age 41 heart attack  . Cancer Father   . Stroke Brother     Allergies  Allergen Reactions  . Amoxicillin-Pot Clavulanate     REACTION: sick  . Ibuprofen     REACTION: GI  . Rosuvastatin     REACTION: foot pain  . Sertraline Hcl     REACTION: more depressed    Current Outpatient Prescriptions  Medication Sig Dispense Refill  . acetaminophen (TYLENOL) 500 MG tablet 1-3 by mouth as needed.       Marland Kitchen  albuterol (PROAIR HFA) 108 (90 BASE) MCG/ACT inhaler Inhale 2 puffs into the lungs every 4 (four) hours as needed for wheezing. 2 puffs up to every 4 hours as needed for wheezing  1 Inhaler  11  . beclomethasone (QVAR) 40 MCG/ACT inhaler Inhale 2 puffs into the lungs 2 (two) times daily.      . cetirizine (ZYRTEC) 10 MG tablet OTC as directed.       Marland Kitchen esomeprazole (NEXIUM) 40 MG capsule Take 40 mg by mouth daily before breakfast.      . latanoprost (XALATAN) 0.005 % ophthalmic solution Place 1 drop into both eyes at bedtime.        . polyethylene glycol (MIRALAX / GLYCOLAX) packet Take 17 g by mouth daily.        . Probiotic Product (HEALTHY COLON) CAPS One daily as needed.       . ranitidine (ZANTAC) 150 MG tablet Take 150 mg by mouth at bedtime.      . calcium carbonate (OS-CAL) 1250 MG chewable tablet 1-2 by mouth as needed.       . Estradiol (ESTRACE PO) Take by mouth as directed.         . fluticasone (FLONASE) 50 MCG/ACT nasal spray 2 sprays by Nasal route daily. As needed.       . Homeopathic Products (EARACHE RELIEF) SOLN OTC as directed.       . pentosan polysulfate (ELMIRON) 100 MG capsule 1 capsule twice a day as needed.         Physical Examination  Filed Vitals:   04/17/12 0948  BP: 131/71  Pulse: 75  Resp: 16    Body mass index is 27.86 kg/(m^2).  General:  WDWN in NAD Gait: Normal HEENT: WNL Eyes: Pupils equal Pulmonary: normal non-labored breathing , without Rales, rhonchi,  wheezing Cardiac: RRR, without  Murmurs, rubs or gallops; No carotid bruits Abdomen: soft, NT, no masses Skin: no rashes, ulcers noted Vascular Exam/Pulses: Patient has lower extremity pulses with Doppler, palpable femoral pulses bilaterally and a palpable abdominal mass.  Extremities without ischemic changes, no Gangrene , no cellulitis; no open wounds;  Musculoskeletal: no muscle wasting or atrophy  Neurologic: A&O X 3; Appropriate Affect ; SENSATION: normal; MOTOR FUNCTION:  moving all extremities equally. Speech is fluent/normal  Non-Invasive Vascular Imaging: AAA duplex today shows a measurement of 4.75 x 4.78 cm, the tech also had a question regarding the left common iliac artery which cannot be identified. I did speak with Dr. Darrick Penna and he stated as long as there was femoral pulses that was not huge concern at this time.  ASSESSMENT/PLAN: Assessment as above. Plan will be for the patient return in 6 months with a repeat AAA duplex, her questions were encouraged and answered. Next  Lauree Chandler ANP  Clinic M.D.: Fields

## 2012-04-17 NOTE — Progress Notes (Signed)
  Patient Name: Ashlee Reyes Date of Birth: 1921/02/16 Age: 78 y.o. Medical Record Number: [id number] Gender: female Date of Encounter: 04/17/2012  History of Present Illness:  Ashlee Reyes is a 78 y.o. very pleasant female patient who presents with the following:  Tick bite, L leg:  The patient is a tick bite on her left leg, that she and her daughter reviewed and removed. They did this several days ago, but now she is having some word worsening redness around this, and some mild tenderness. No warmth.  No systemic fevers, chills, or sweats. She is eating and drinking okay, and is otherwise feeling okay.  Past Medical History, Surgical History, Social History, Family History, Problem List, Medications, and Allergies have been reviewed and updated if relevant.  Review of Systems: ROS: GEN: Acute illness details above GI: Tolerating PO intake GU: maintaining adequate hydration and urination Pulm: No SOB Interactive and getting along well at home.  Otherwise, ROS is as per the HPI.   Physical Examination: Filed Vitals:   04/17/12 1136  BP: 120/70  Pulse: 76  Temp: 97.8 F (36.6 C)  TempSrc: Oral  Height: 5' 6.5" (1.689 m)  Weight: 173 lb 12.8 oz (78.835 kg)  SpO2: 96%    Body mass index is 27.63 kg/(m^2).   GEN: WDWN, NAD, Non-toxic, Alert & Oriented x 3 HEENT: Atraumatic, Normocephalic.  Ears and Nose: No external deformity. EXTR: No clubbing/cyanosis/edema NEURO: Normal gait.  PSYCH: Normally interactive. Conversant. Not depressed or anxious appearing.  Calm demeanor.  Lateral aspect of left leg. There is a small darkened area with some scab, and some surrounding erythema approx 3 in. No warmth.  Assessment and Plan:  1. Cellulitis, leg   2. Tick bite    Scab versus remainder of tick removed with pickups.  Early cellulitis, treated with Keflex  Orders Today: No orders of the defined types were placed in this encounter.    Medications Today: Meds ordered  this encounter  Medications  . cephALEXin (KEFLEX) 500 MG capsule    Sig: Take 2 capsules (1,000 mg total) by mouth 2 (two) times daily.    Dispense:  40 capsule    Refill:  0

## 2012-04-17 NOTE — Progress Notes (Signed)
Addended by: Sharee Pimple on: 04/17/2012 02:33 PM   Modules accepted: Orders

## 2012-04-18 ENCOUNTER — Telehealth: Payer: Self-pay | Admitting: Family Medicine

## 2012-04-18 MED ORDER — DOXYCYCLINE HYCLATE 100 MG PO TABS: 100.0000 mg | ORAL_TABLET | Freq: Two times a day (BID) | ORAL | Status: DC

## 2012-04-18 MED ORDER — DOXYCYCLINE HYCLATE 100 MG PO TABS: 100.0000 mg | ORAL_TABLET | Freq: Two times a day (BID) | ORAL | Status: AC

## 2012-04-18 NOTE — Telephone Encounter (Signed)
Caller: Linda/Child; PCP: Hannah Beat T.; CB#: [phone]; ; ; Call regarding States Having Stomach Pain On Keflex. Started Antibiotic 04/17/12 for treatment of tick bite; states after two doses of the keflex, her stomach is very painful.  Already takes probiotics.  Would like Dr. Patsy Lager to consider changing or reducing antibiotic.  Declines triage; states other symptoms unchanged.  INFO TO OFFICE FOR PROVIDER REVIEW/RX CHANGE/CALLBACK. MAY REACH DAUGHTER AT [phone] or CELL [phone].

## 2012-04-18 NOTE — Telephone Encounter (Signed)
Lets change to doxycycline - 100 mg bid  udpate me if worse or no imp  Px written for call in

## 2012-04-18 NOTE — Telephone Encounter (Signed)
[name] advised as instructed via telephone, Rx sent to [facility] electronically.

## 2012-04-25 NOTE — Procedures (Unsigned)
DUPLEX ULTRASOUND OF ABDOMINAL AORTA  INDICATION:  Abdominal aortic aneurysm.  HISTORY: Diabetes:  No. Cardiac:  CAD. Hypertension:  Yes. Smoking:  No. Connective Tissue Disorder: Family History:  No. Previous Surgery:  No aortic intervention.  DUPLEX EXAM:         AP (cm)                   TRANSVERSE (cm) Proximal             1.61 cm                   2.01 cm Mid                  1.99 cm                   2.16 cm Distal               4.75/4.49 cm              4.78/4.41 cm Right Iliac          1.61 cm                   1.57 cm Left Iliac           Not identified            Not identified  PREVIOUS:  Date: Ultrasound at [facility] 10/11/2011  AP:  4.8  TRANSVERSE: 4.8  IMPRESSION: 1. Infrarenal abdominal aortic aneurysm present measuring 4.75 cm x     4.78 cm. 2. This is an increase since study done on 06/21/2011; however, stable     when compared to study done at [facility] on 10/11/2011. 3. The left common iliac artery could not be identified which may be     suggestive of a significant disease process or chronic occlusion.  ___________________________________________ Janetta Hora Fields, MD  SH/MEDQ  D:  04/17/2012  T:  04/17/2012  Job:  [id number]

## 2012-04-29 DIAGNOSIS — R279 Unspecified lack of coordination: Secondary | ICD-10-CM | POA: Diagnosis not present

## 2012-04-29 DIAGNOSIS — R351 Nocturia: Secondary | ICD-10-CM | POA: Diagnosis not present

## 2012-04-29 DIAGNOSIS — R3915 Urgency of urination: Secondary | ICD-10-CM | POA: Diagnosis not present

## 2012-04-29 DIAGNOSIS — M6281 Muscle weakness (generalized): Secondary | ICD-10-CM | POA: Diagnosis not present

## 2012-05-13 DIAGNOSIS — IMO0002 Reserved for concepts with insufficient information to code with codable children: Secondary | ICD-10-CM | POA: Diagnosis not present

## 2012-05-13 DIAGNOSIS — M48061 Spinal stenosis, lumbar region without neurogenic claudication: Secondary | ICD-10-CM | POA: Diagnosis not present

## 2012-05-14 DIAGNOSIS — R350 Frequency of micturition: Secondary | ICD-10-CM | POA: Diagnosis not present

## 2012-05-14 DIAGNOSIS — R3915 Urgency of urination: Secondary | ICD-10-CM | POA: Diagnosis not present

## 2012-05-14 DIAGNOSIS — N3010 Interstitial cystitis (chronic) without hematuria: Secondary | ICD-10-CM | POA: Diagnosis not present

## 2012-06-23 DIAGNOSIS — N3010 Interstitial cystitis (chronic) without hematuria: Secondary | ICD-10-CM | POA: Diagnosis not present

## 2012-06-23 DIAGNOSIS — R350 Frequency of micturition: Secondary | ICD-10-CM | POA: Diagnosis not present

## 2012-07-15 DIAGNOSIS — R1310 Dysphagia, unspecified: Secondary | ICD-10-CM | POA: Diagnosis not present

## 2012-07-15 DIAGNOSIS — R05 Cough: Secondary | ICD-10-CM | POA: Diagnosis not present

## 2012-07-15 DIAGNOSIS — R490 Dysphonia: Secondary | ICD-10-CM | POA: Diagnosis not present

## 2012-07-15 DIAGNOSIS — K219 Gastro-esophageal reflux disease without esophagitis: Secondary | ICD-10-CM | POA: Diagnosis not present

## 2012-07-15 DIAGNOSIS — H6120 Impacted cerumen, unspecified ear: Secondary | ICD-10-CM | POA: Diagnosis not present

## 2012-07-17 DIAGNOSIS — D235 Other benign neoplasm of skin of trunk: Secondary | ICD-10-CM | POA: Diagnosis not present

## 2012-07-17 DIAGNOSIS — D485 Neoplasm of uncertain behavior of skin: Secondary | ICD-10-CM | POA: Diagnosis not present

## 2012-07-25 ENCOUNTER — Telehealth: Payer: Self-pay

## 2012-07-25 NOTE — Telephone Encounter (Signed)
Last night dog scratched arm; tore skin back in 3- 4 places; no puncture wound. Cleaned area and bleeding stopped,polysporin ointment applied. Pt has not had tetanus in approx 8 years.Please advise.

## 2012-07-25 NOTE — Telephone Encounter (Signed)
She needs Td- I do not think we have visit slots open further today- but ask Jamesetta So if it would be ok to have her in for a nurse visit for Td today thanks

## 2012-07-28 NOTE — Telephone Encounter (Signed)
LMOVM to call in to schedule Nurse Visit for Td.

## 2012-07-29 ENCOUNTER — Encounter: Payer: Self-pay | Admitting: Family Medicine

## 2012-07-29 ENCOUNTER — Ambulatory Visit (INDEPENDENT_AMBULATORY_CARE_PROVIDER_SITE_OTHER): Payer: Medicare Other | Admitting: Family Medicine

## 2012-07-29 VITALS — BP 142/83 | HR 80 | Temp 97.7°F | Ht 66.5 in | Wt 170.0 lb

## 2012-07-29 DIAGNOSIS — Z23 Encounter for immunization: Secondary | ICD-10-CM | POA: Diagnosis not present

## 2012-07-29 DIAGNOSIS — L089 Local infection of the skin and subcutaneous tissue, unspecified: Secondary | ICD-10-CM | POA: Insufficient documentation

## 2012-07-29 MED ORDER — LEVOFLOXACIN 500 MG PO TABS: 500.0000 mg | ORAL_TABLET | Freq: Every day | ORAL | Status: AC

## 2012-07-29 NOTE — Progress Notes (Signed)
Subjective:    Patient ID: Ashlee Reyes, female    DOB: 09-14-1921, 78 y.o.   MRN: [id number]  HPI Here with wound on L arm Thursday night - and dog jumped on her and back feet/ paws scraped her arm  Not a bite Washed with soap and water Then used polysporin This am - had pus  Used salt water -and peroxide   Red and is sore No fever   Td 8 years   Patient Active Problem List  Diagnosis  . HYPERCHOLESTEROLEMIA  . DISORDER, DEPRESSIVE NEC  . MIXED HEARING LOSS BILATERAL  . C A D  . REACTIVE AIRWAY DISEASE  . GERD  . DUODENITIS  . HIATAL HERNIA  . DIVERTICULITIS, COLON W/O HEM  . IBS  . INTERSTITIAL CYSTITIS  . OSTEOARTHRITIS  . DEGENERATIVE JOINT DISEASE, LUMBAR SPINE  . POSTNASAL DRIP SYNDROME  . ALLERGY  . HX, URINARY INFECTION  . Chest pain  . Abdominal aortic aneurysm  . Leaking abdominal aortic aneurysm   Past Medical History  Diagnosis Date  . Osteoarthritis     spine/spinal stenosis  . GERD (gastroesophageal reflux disease)   . Hemorrhoids   . IC (irritable colon)     DMSO in past  . Allergy     allergic rhinitis  . IBS (irritable bowel syndrome)   . Diverticulosis   . CAD (coronary artery disease) 07/2009    mod by cath 08/10  . AAA (abdominal aortic aneurysm)     followed by Dr Darrick Penna. Last u/s07/10 stable AAA with largest measurement 3.97 x 4.02cm.  . Cancer     basal cell cancer - face.  . Osteopenia 2011  . Hyperlipidemia   . Depression   . Asthma    Past Surgical History  Procedure Date  . Right colectomy 1997    benign  . Abdominal hysterectomy     partial not cancer  . Cystocele repair   . Rectocele repair   . Hernia repair 11/1997    umbilical hernia  . Esophagogastroduodenoscopy 09/1998    erosive esphagitis ? stricture treated 12/2002  . Refractive surgery     for film after cataract surgery.  . Skin lesion excision 05/2011    basal cell skin lesion removed.   History  Substance Use Topics  . Smoking status: Never Smoker    . Smokeless tobacco: Not on file  . Alcohol Use: No   Family History  Problem Relation Age of Onset  . Heart disease Mother     deceased age 19 heart attack.  Marland Kitchen Heart disease Father     age 90 heart attack  . Cancer Father   . Stroke Brother    Allergies  Allergen Reactions  . Amoxicillin-Pot Clavulanate     REACTION: sick  . Ibuprofen     REACTION: GI  . Keflex (Cephalexin)     abd pain / GI upset  . Rosuvastatin     REACTION: foot pain  . Sertraline Hcl     REACTION: more depressed   Current Outpatient Prescriptions on File Prior to Visit  Medication Sig Dispense Refill  . acetaminophen (TYLENOL) 500 MG tablet 1-3 by mouth as needed.       Marland Kitchen albuterol (PROAIR HFA) 108 (90 BASE) MCG/ACT inhaler Inhale 2 puffs into the lungs every 4 (four) hours as needed for wheezing. 2 puffs up to every 4 hours as needed for wheezing  1 Inhaler  11  . beclomethasone (QVAR) 40 MCG/ACT inhaler Inhale  2 puffs into the lungs 2 (two) times daily.      . cetirizine (ZYRTEC) 10 MG tablet OTC as directed.       Marland Kitchen esomeprazole (NEXIUM) 40 MG capsule Take 40 mg by mouth daily before breakfast.      . latanoprost (XALATAN) 0.005 % ophthalmic solution Place 1 drop into both eyes at bedtime.        . polyethylene glycol (MIRALAX / GLYCOLAX) packet Take 17 g by mouth daily.        . Probiotic Product (HEALTHY COLON) CAPS One daily as needed.       . ranitidine (ZANTAC) 150 MG tablet Take 150 mg by mouth at bedtime.      . calcium carbonate (OS-CAL) 1250 MG chewable tablet 1-2 by mouth as needed.       . fluticasone (FLONASE) 50 MCG/ACT nasal spray 2 sprays by Nasal route daily. As needed.           Review of Systems Review of Systems  Constitutional: Negative for fever, appetite change, fatigue and unexpected weight change.  Eyes: Negative for pain and visual disturbance.  Respiratory: Negative for cough and shortness of breath.   Cardiovascular: Negative for cp or palpitations      Gastrointestinal: Negative for nausea, diarrhea and constipation.  Genitourinary: Negative for urgency and frequency.  Skin: Negative for pallor or rash  pos for wound that had drainage , pos for wound soreness Neurological: Negative for weakness, light-headedness, numbness and headaches.  Hematological: Negative for adenopathy. Does not bruise/bleed easily.  Psychiatric/Behavioral: Negative for dysphoric mood. The patient is not nervous/anxious.         Objective:   Physical Exam  Constitutional: She appears well-developed and well-nourished. No distress.  HENT:  Head: Normocephalic and atraumatic.  Eyes: Conjunctivae and EOM are normal. Pupils are equal, round, and reactive to light.  Neck: Normal range of motion. Neck supple.  Cardiovascular: Normal rate and regular rhythm.   Pulmonary/Chest: Effort normal.  Musculoskeletal: She exhibits no tenderness.       No bony tenderness of arm Nl rom arm/ wrist and fingers   Lymphadenopathy:    She has no cervical adenopathy.  Neurological: She is alert. She has normal reflexes. She exhibits normal muscle tone.  Skin: Skin is warm and dry. There is erythema. No pallor.       L lower arm 3 2-3 cm shallow puncture wounds and 1 small skin tear Some surrounding erythema and scant swelling  No drainage Mildly tender Nl perf and rom of ext   Psychiatric: She has a normal mood and affect.          Assessment & Plan:

## 2012-07-29 NOTE — Assessment & Plan Note (Signed)
From dog scratch/ skin tear - mild on L lower arm (3 small shallow puncture areas and 1 cm skin tear) Will tx with levaquin and disc care of wound- see AVS Td today to update  Will call if worse or fever or not better in 7 day

## 2012-07-29 NOTE — Patient Instructions (Addendum)
Take the levaquin for skin infection Wash the wound with antibacterial soap and water twice daily and use polysporin  Tetanus shot today Follow up / call if worse at all or not better in 1 week

## 2012-08-11 ENCOUNTER — Ambulatory Visit: Payer: Medicare Other | Admitting: Family Medicine

## 2012-08-12 DIAGNOSIS — H01009 Unspecified blepharitis unspecified eye, unspecified eyelid: Secondary | ICD-10-CM | POA: Diagnosis not present

## 2012-08-12 DIAGNOSIS — H52209 Unspecified astigmatism, unspecified eye: Secondary | ICD-10-CM | POA: Diagnosis not present

## 2012-08-12 DIAGNOSIS — H409 Unspecified glaucoma: Secondary | ICD-10-CM | POA: Diagnosis not present

## 2012-08-12 DIAGNOSIS — H4011X0 Primary open-angle glaucoma, stage unspecified: Secondary | ICD-10-CM | POA: Diagnosis not present

## 2012-09-17 ENCOUNTER — Ambulatory Visit (INDEPENDENT_AMBULATORY_CARE_PROVIDER_SITE_OTHER): Payer: Medicare Other | Admitting: Family Medicine

## 2012-09-17 ENCOUNTER — Encounter: Payer: Self-pay | Admitting: Family Medicine

## 2012-09-17 VITALS — BP 126/68 | HR 78 | Temp 98.1°F

## 2012-09-17 DIAGNOSIS — Z23 Encounter for immunization: Secondary | ICD-10-CM

## 2012-09-17 DIAGNOSIS — T881XXA Other complications following immunization, not elsewhere classified, initial encounter: Secondary | ICD-10-CM | POA: Insufficient documentation

## 2012-09-17 DIAGNOSIS — T8062XA Other serum reaction due to vaccination, initial encounter: Secondary | ICD-10-CM

## 2012-09-17 NOTE — Assessment & Plan Note (Signed)
From Td vaccine in R arm in aug  Started large now small area with itching - pt never had fever or angioedema  No signs of necrosis Disc use of cold compresses and otc hydrocortisone cream  Update if this does not continue to improve  Given her age - she will not likely need another Td in the future

## 2012-09-17 NOTE — Progress Notes (Signed)
Subjective:    Patient ID: Ashlee Reyes, female    DOB: 10/27/1921, 78 y.o.   MRN: [id number]  HPI Has a red spot on her arm   Had Td on aug 13th  Had been scratched by a dog  Started as a bit area of swelling round area of redness and soreness Now down to about a cm of erythema  Still itches - tries not to scratch   Used some polysporin   Takes zyrtec - that helps   Needs her flu shot   Patient Active Problem List  Diagnosis  . HYPERCHOLESTEROLEMIA  . DISORDER, DEPRESSIVE NEC  . MIXED HEARING LOSS BILATERAL  . C A D  . REACTIVE AIRWAY DISEASE  . GERD  . DUODENITIS  . HIATAL HERNIA  . DIVERTICULITIS, COLON W/O HEM  . IBS  . INTERSTITIAL CYSTITIS  . OSTEOARTHRITIS  . DEGENERATIVE JOINT DISEASE, LUMBAR SPINE  . POSTNASAL DRIP SYNDROME  . ALLERGY  . HX, URINARY INFECTION  . Chest pain  . Abdominal aortic aneurysm  . Leaking abdominal aortic aneurysm  . Bacterial skin infection of upper extremity   Past Medical History  Diagnosis Date  . Osteoarthritis     spine/spinal stenosis  . GERD (gastroesophageal reflux disease)   . Hemorrhoids   . IC (irritable colon)     DMSO in past  . Allergy     allergic rhinitis  . IBS (irritable bowel syndrome)   . Diverticulosis   . CAD (coronary artery disease) 07/2009    mod by cath 08/10  . AAA (abdominal aortic aneurysm)     followed by Dr Darrick Penna. Last u/s07/10 stable AAA with largest measurement 3.97 x 4.02cm.  . Cancer     basal cell cancer - face.  . Osteopenia 2011  . Hyperlipidemia   . Depression   . Asthma    Past Surgical History  Procedure Date  . Right colectomy 1997    benign  . Abdominal hysterectomy     partial not cancer  . Cystocele repair   . Rectocele repair   . Hernia repair 11/1997    umbilical hernia  . Esophagogastroduodenoscopy 09/1998    erosive esphagitis ? stricture treated 12/2002  . Refractive surgery     for film after cataract surgery.  . Skin lesion excision 05/2011    basal cell  skin lesion removed.   History  Substance Use Topics  . Smoking status: Never Smoker   . Smokeless tobacco: Not on file  . Alcohol Use: No   Family History  Problem Relation Age of Onset  . Heart disease Mother     deceased age 64 heart attack.  Marland Kitchen Heart disease Father     age 63 heart attack  . Cancer Father   . Stroke Brother    Allergies  Allergen Reactions  . Amoxicillin-Pot Clavulanate     REACTION: sick  . Ibuprofen     REACTION: GI  . Keflex (Cephalexin)     abd pain / GI upset  . Rosuvastatin     REACTION: foot pain  . Sertraline Hcl     REACTION: more depressed   Current Outpatient Prescriptions on File Prior to Visit  Medication Sig Dispense Refill  . acetaminophen (TYLENOL) 500 MG tablet 1-3 by mouth as needed.       Marland Kitchen albuterol (PROAIR HFA) 108 (90 BASE) MCG/ACT inhaler Inhale 2 puffs into the lungs every 4 (four) hours as needed for wheezing. 2 puffs up to  every 4 hours as needed for wheezing  1 Inhaler  11  . beclomethasone (QVAR) 40 MCG/ACT inhaler Inhale 2 puffs into the lungs 2 (two) times daily.      . calcium carbonate (OS-CAL) 1250 MG chewable tablet 1-2 by mouth as needed.       . cetirizine (ZYRTEC) 10 MG tablet OTC as directed.       Marland Kitchen esomeprazole (NEXIUM) 40 MG capsule Take 40 mg by mouth daily before breakfast.      . fluticasone (FLONASE) 50 MCG/ACT nasal spray 2 sprays by Nasal route daily. As needed.       . latanoprost (XALATAN) 0.005 % ophthalmic solution Place 1 drop into both eyes at bedtime.        . polyethylene glycol (MIRALAX / GLYCOLAX) packet Take 17 g by mouth daily.        . Probiotic Product (HEALTHY COLON) CAPS One daily as needed.       . ranitidine (ZANTAC) 150 MG tablet Take 150 mg by mouth at bedtime.            Review of Systems Review of Systems  Constitutional: Negative for fever, appetite change, fatigue and unexpected weight change.  Eyes: Negative for pain and visual disturbance.  ENT neg for mouth or throat  swelling  Respiratory: Negative for cough and shortness of breath.  neg for wheeze Cardiovascular: Negative for cp or palpitations    Gastrointestinal: Negative for nausea, diarrhea and constipation.  Genitourinary: Negative for urgency and frequency.  Skin: Negative for pallor or rash  pos for redness/ itch on R arm  Neurological: Negative for weakness, light-headedness, numbness and headaches.  Hematological: Negative for adenopathy. Does not bruise/bleed easily.  Psychiatric/Behavioral: Negative for dysphoric mood. The patient is not nervous/anxious.         Objective:   Physical Exam  Constitutional: She appears well-developed and well-nourished. No distress.  HENT:  Head: Normocephalic and atraumatic.  Eyes: Conjunctivae normal and EOM are normal. Pupils are equal, round, and reactive to light.  Neck: Normal range of motion. Neck supple. No tracheal deviation present.  Pulmonary/Chest: Effort normal and breath sounds normal. She has no wheezes.  Musculoskeletal: She exhibits no tenderness.  Lymphadenopathy:    She has no cervical adenopathy.  Neurological: She is alert.  Skin: Skin is warm and dry. No rash noted. There is erythema. No pallor.       Dime sized area of erythema and induration on R arm - no excoriation, no tenderness  No drainage    Psychiatric: She has a normal mood and affect.          Assessment & Plan:

## 2012-09-17 NOTE — Patient Instructions (Addendum)
I think you had a local allergic reaction to your tetanus shot  It it itches use a cool compress and continue your zyrtec Also try cort aid over the counter instead of polysporin  If symptoms worsen- let me know

## 2012-09-24 DIAGNOSIS — M48061 Spinal stenosis, lumbar region without neurogenic claudication: Secondary | ICD-10-CM | POA: Diagnosis not present

## 2012-09-24 DIAGNOSIS — IMO0002 Reserved for concepts with insufficient information to code with codable children: Secondary | ICD-10-CM | POA: Diagnosis not present

## 2012-10-16 ENCOUNTER — Other Ambulatory Visit: Payer: Medicare Other

## 2012-10-16 ENCOUNTER — Ambulatory Visit: Payer: Medicare Other | Admitting: Neurosurgery

## 2012-10-29 ENCOUNTER — Encounter: Payer: Self-pay | Admitting: Neurosurgery

## 2012-10-30 ENCOUNTER — Ambulatory Visit: Payer: Medicare Other | Admitting: Neurosurgery

## 2012-11-19 DIAGNOSIS — M48061 Spinal stenosis, lumbar region without neurogenic claudication: Secondary | ICD-10-CM | POA: Diagnosis not present

## 2012-11-19 DIAGNOSIS — IMO0002 Reserved for concepts with insufficient information to code with codable children: Secondary | ICD-10-CM | POA: Diagnosis not present

## 2012-11-20 DIAGNOSIS — H4011X0 Primary open-angle glaucoma, stage unspecified: Secondary | ICD-10-CM | POA: Diagnosis not present

## 2012-11-20 DIAGNOSIS — H409 Unspecified glaucoma: Secondary | ICD-10-CM | POA: Diagnosis not present

## 2012-11-26 ENCOUNTER — Encounter: Payer: Self-pay | Admitting: Neurosurgery

## 2012-11-27 ENCOUNTER — Encounter (INDEPENDENT_AMBULATORY_CARE_PROVIDER_SITE_OTHER): Payer: Medicare Other | Admitting: *Deleted

## 2012-11-27 ENCOUNTER — Encounter: Payer: Self-pay | Admitting: Neurosurgery

## 2012-11-27 ENCOUNTER — Ambulatory Visit (INDEPENDENT_AMBULATORY_CARE_PROVIDER_SITE_OTHER): Payer: Medicare Other | Admitting: Neurosurgery

## 2012-11-27 VITALS — BP 162/78 | HR 68 | Resp 18 | Ht 66.0 in | Wt 164.4 lb

## 2012-11-27 DIAGNOSIS — I714 Abdominal aortic aneurysm, without rupture, unspecified: Secondary | ICD-10-CM

## 2012-11-27 NOTE — Addendum Note (Signed)
Addended by: Sharee Pimple on: 11/27/2012 11:01 AM   Modules accepted: Orders

## 2012-11-27 NOTE — Progress Notes (Signed)
[facility] AAA/Carotid Office Note  CC: AAA surveillance Referring Physician: Fields  History of Present Illness: 78 year old female patient of Dr. Darrick Penna with known AAA. The patient denies any unusual abdominal or back pain. The patient denies any new medical diagnoses or recent surgery.  Past Medical History  Diagnosis Date  . Osteoarthritis     spine/spinal stenosis  . GERD (gastroesophageal reflux disease)   . Hemorrhoids   . IC (irritable colon)     DMSO in past  . Allergy     allergic rhinitis  . IBS (irritable bowel syndrome)   . Diverticulosis   . CAD (coronary artery disease) 07/2009    mod by cath 08/10  . AAA (abdominal aortic aneurysm)     followed by Dr Darrick Penna. Last u/s07/10 stable AAA with largest measurement 3.97 x 4.02cm.  . Cancer     basal cell cancer - face.  . Osteopenia 2011  . Hyperlipidemia   . Depression   . Asthma     ROS: [x]  Positive   [ ]  Denies    General: [ ]  Weight loss, [ ]  Fever, [ ]  chills Neurologic: [ ]  Dizziness, [ ]  Blackouts, [ ]  Seizure [ ]  Stroke, [ ]  "Mini stroke", [ ]  Slurred speech, [ ]  Temporary blindness; [ ]  weakness in arms or legs, [ ]  Hoarseness Cardiac: [ ]  Chest pain/pressure, [ ]  Shortness of breath at rest [ ]  Shortness of breath with exertion, [ ]  Atrial fibrillation or irregular heartbeat Vascular: [ ]  Pain in legs with walking, [ ]  Pain in legs at rest, [ ]  Pain in legs at night,  [ ]  Non-healing ulcer, [ ]  Blood clot in vein/DVT,   Pulmonary: [ ]  Home oxygen, [ ]  Productive cough, [ ]  Coughing up blood, [ ]  Asthma,  [ ]  Wheezing Musculoskeletal:  [ ]  Arthritis, [ ]  Low back pain, [ ]  Joint pain Hematologic: [ ]  Easy Bruising, [ ]  Anemia; [ ]  Hepatitis Gastrointestinal: [ ]  Blood in stool, [ ]  Gastroesophageal Reflux/heartburn, [ ]  Trouble swallowing Urinary: [ ]  chronic Kidney disease, [ ]  on HD - [ ]  MWF or [ ]  TTHS, [ ]  Burning with urination, [ ]  Difficulty urinating Skin: [ ]   Rashes, [ ]  Wounds Psychological: [ ]  Anxiety, [ ]  Depression   Social History History  Substance Use Topics  . Smoking status: Never Smoker   . Smokeless tobacco: Never Used  . Alcohol Use: No    Family History Family History  Problem Relation Age of Onset  . Heart disease Mother     deceased age 36 heart attack.  Marland Kitchen Heart disease Father     age 29 heart attack  . Cancer Father   . Stroke Brother     Allergies  Allergen Reactions  . Amoxicillin-Pot Clavulanate     REACTION: sick  . Ibuprofen     REACTION: GI  . Keflex (Cephalexin)     abd pain / GI upset  . Rosuvastatin     REACTION: foot pain  . Sertraline Hcl     REACTION: more depressed  . Tetanus Toxoids     Local redness and swelling     Current Outpatient Prescriptions  Medication Sig Dispense Refill  . acetaminophen (TYLENOL) 500 MG tablet 1-3 by mouth as needed.       Marland Kitchen albuterol (PROAIR HFA) 108 (90 BASE) MCG/ACT inhaler Inhale 2 puffs into the lungs every 4 (four) hours as needed for wheezing. 2  puffs up to every 4 hours as needed for wheezing  1 Inhaler  11  . beclomethasone (QVAR) 40 MCG/ACT inhaler Inhale 2 puffs into the lungs 2 (two) times daily.      . cetirizine (ZYRTEC) 10 MG tablet OTC as directed.       Marland Kitchen esomeprazole (NEXIUM) 40 MG capsule Take 40 mg by mouth daily before breakfast.      . fluticasone (FLONASE) 50 MCG/ACT nasal spray 2 sprays by Nasal route daily. As needed.       . latanoprost (XALATAN) 0.005 % ophthalmic solution Place 1 drop into both eyes at bedtime.        . polyethylene glycol (MIRALAX / GLYCOLAX) packet Take 17 g by mouth daily.        . Probiotic Product (HEALTHY COLON) CAPS One daily as needed.       . ranitidine (ZANTAC) 150 MG tablet Take 150 mg by mouth at bedtime.      . calcium carbonate (OS-CAL) 1250 MG chewable tablet 1-2 by mouth as needed.         Physical Examination  Filed Vitals:   11/27/12 0914  BP: 162/78  Pulse: 68  Resp: 18    Body mass index  is 26.53 kg/(m^2).  General:  WDWN in NAD Gait: Normal HEENT: WNL Eyes: Pupils equal Pulmonary: normal non-labored breathing , without Rales, rhonchi,  wheezing Cardiac: RRR, without  Murmurs, rubs or gallops; Abdomen: soft, NT, no masses Skin: no rashes, ulcers noted  Vascular Exam Pulses: 2+ radial pulses bilaterally Carotid bruits: Carotid pulses to auscultation no bruits are heard, there is a palpable pulsatile abdominal mass Extremities without ischemic changes, no Gangrene , no cellulitis; no open wounds;  Musculoskeletal: no muscle wasting or atrophy   Neurologic: A&O X 3; Appropriate Affect ; SENSATION: normal; MOTOR FUNCTION:  moving all extremities equally. Speech is fluent/normal  Non-Invasive Vascular Imaging AAA duplex shows a maximum diameter of 4.9 x 4.9 which is a slight increase from may of 2013 when she was 4.7 x 4.7  ASSESSMENT/PLAN: Asymptomatic AAA that is enlarging. The patient understands the signs and symptoms of rupture and knows to call 911 if that should occur. The patient will followup in 6 months with a repeat carotid duplex and I did explain to the patient if it has enlarged any further we will obtain a CT to further evaluate and to make treatment recommendations. The patient's questions were encouraged and answered, she is in agreement with this plan.  Lauree Chandler ANP   Clinic MD: Darrick Penna

## 2012-12-02 ENCOUNTER — Emergency Department (HOSPITAL_COMMUNITY): Payer: Medicare Other

## 2012-12-02 ENCOUNTER — Emergency Department (INDEPENDENT_AMBULATORY_CARE_PROVIDER_SITE_OTHER)
Admission: EM | Admit: 2012-12-02 | Discharge: 2012-12-02 | Disposition: A | Discharge status: Nursing Home (Includes ICF) | Payer: Medicare Other | Source: Home / Self Care

## 2012-12-02 ENCOUNTER — Encounter (HOSPITAL_COMMUNITY): Payer: Self-pay | Admitting: Emergency Medicine

## 2012-12-02 ENCOUNTER — Encounter (HOSPITAL_COMMUNITY): Payer: Self-pay | Admitting: *Deleted

## 2012-12-02 ENCOUNTER — Emergency Department (HOSPITAL_COMMUNITY)
Admission: EM | Admit: 2012-12-02 | Discharge: 2012-12-02 | Disposition: A | Discharge status: Home / Self Care | Payer: Medicare Other | Attending: Emergency Medicine | Admitting: Emergency Medicine

## 2012-12-02 DIAGNOSIS — Z8739 Personal history of other diseases of the musculoskeletal system and connective tissue: Secondary | ICD-10-CM | POA: Diagnosis not present

## 2012-12-02 DIAGNOSIS — IMO0002 Reserved for concepts with insufficient information to code with codable children: Secondary | ICD-10-CM | POA: Insufficient documentation

## 2012-12-02 DIAGNOSIS — F3289 Other specified depressive episodes: Secondary | ICD-10-CM | POA: Insufficient documentation

## 2012-12-02 DIAGNOSIS — J45909 Unspecified asthma, uncomplicated: Secondary | ICD-10-CM | POA: Insufficient documentation

## 2012-12-02 DIAGNOSIS — I714 Abdominal aortic aneurysm, without rupture, unspecified: Secondary | ICD-10-CM | POA: Insufficient documentation

## 2012-12-02 DIAGNOSIS — I2510 Atherosclerotic heart disease of native coronary artery without angina pectoris: Secondary | ICD-10-CM | POA: Insufficient documentation

## 2012-12-02 DIAGNOSIS — Z881 Allergy status to other antibiotic agents status: Secondary | ICD-10-CM | POA: Diagnosis not present

## 2012-12-02 DIAGNOSIS — F329 Major depressive disorder, single episode, unspecified: Secondary | ICD-10-CM | POA: Insufficient documentation

## 2012-12-02 DIAGNOSIS — Z79899 Other long term (current) drug therapy: Secondary | ICD-10-CM | POA: Insufficient documentation

## 2012-12-02 DIAGNOSIS — E785 Hyperlipidemia, unspecified: Secondary | ICD-10-CM | POA: Insufficient documentation

## 2012-12-02 DIAGNOSIS — Z90710 Acquired absence of both cervix and uterus: Secondary | ICD-10-CM | POA: Diagnosis not present

## 2012-12-02 DIAGNOSIS — K219 Gastro-esophageal reflux disease without esophagitis: Secondary | ICD-10-CM | POA: Diagnosis not present

## 2012-12-02 DIAGNOSIS — Z888 Allergy status to other drugs, medicaments and biological substances status: Secondary | ICD-10-CM | POA: Diagnosis not present

## 2012-12-02 DIAGNOSIS — Z8589 Personal history of malignant neoplasm of other organs and systems: Secondary | ICD-10-CM | POA: Diagnosis not present

## 2012-12-02 DIAGNOSIS — Z8719 Personal history of other diseases of the digestive system: Secondary | ICD-10-CM | POA: Diagnosis not present

## 2012-12-02 DIAGNOSIS — K589 Irritable bowel syndrome without diarrhea: Secondary | ICD-10-CM | POA: Insufficient documentation

## 2012-12-02 DIAGNOSIS — G43909 Migraine, unspecified, not intractable, without status migrainosus: Secondary | ICD-10-CM | POA: Diagnosis not present

## 2012-12-02 DIAGNOSIS — Z9889 Other specified postprocedural states: Secondary | ICD-10-CM | POA: Insufficient documentation

## 2012-12-02 DIAGNOSIS — D332 Benign neoplasm of brain, unspecified: Secondary | ICD-10-CM | POA: Diagnosis not present

## 2012-12-02 DIAGNOSIS — R51 Headache: Secondary | ICD-10-CM | POA: Diagnosis not present

## 2012-12-02 DIAGNOSIS — R42 Dizziness and giddiness: Secondary | ICD-10-CM

## 2012-12-02 LAB — URINALYSIS, MICROSCOPIC ONLY
Bilirubin Urine: NEGATIVE
Glucose, UA: NEGATIVE mg/dL
Ketones, ur: NEGATIVE mg/dL
Nitrite: POSITIVE — AB
Protein, ur: NEGATIVE mg/dL
Specific Gravity, Urine: 1.009 (ref 1.005–1.030)
Urobilinogen, UA: 0.2 mg/dL (ref 0.0–1.0)
pH: 6 (ref 5.0–8.0)

## 2012-12-02 LAB — COMPREHENSIVE METABOLIC PANEL WITH GFR
ALT: 9 U/L (ref 0–35)
AST: 12 U/L (ref 0–37)
Albumin: 3.4 g/dL — ABNORMAL LOW (ref 3.5–5.2)
Alkaline Phosphatase: 76 U/L (ref 39–117)
BUN: 19 mg/dL (ref 6–23)
CO2: 29 meq/L (ref 19–32)
Calcium: 9.3 mg/dL (ref 8.4–10.5)
Chloride: 105 meq/L (ref 96–112)
Creatinine, Ser: 0.84 mg/dL (ref 0.50–1.10)
GFR calc Af Amer: 69 mL/min — ABNORMAL LOW
GFR calc non Af Amer: 60 mL/min — ABNORMAL LOW
Glucose, Bld: 93 mg/dL (ref 70–99)
Potassium: 4.1 meq/L (ref 3.5–5.1)
Sodium: 142 meq/L (ref 135–145)
Total Bilirubin: 0.3 mg/dL (ref 0.3–1.2)
Total Protein: 6.9 g/dL (ref 6.0–8.3)

## 2012-12-02 LAB — CBC
MCHC: 31.4 g/dL (ref 30.0–36.0)
Platelets: 215 10*3/uL (ref 150–400)
RDW: 14.3 % (ref 11.5–15.5)

## 2012-12-02 LAB — TROPONIN I: Troponin I: <0.3 ng/mL

## 2012-12-02 LAB — PROTIME-INR
INR: 0.97 (ref 0.00–1.49)
Prothrombin Time: 12.8 seconds (ref 11.6–15.2)

## 2012-12-02 LAB — APTT: aPTT: 30 seconds (ref 24–37)

## 2012-12-02 MED ORDER — SODIUM CHLORIDE 0.9 % IV BOLUS (SEPSIS)
500.0000 mL | Freq: Once | INTRAVENOUS | Status: AC
  Administered 2012-12-02: 16:00:00 via INTRAVENOUS

## 2012-12-02 MED ORDER — METOCLOPRAMIDE HCL 5 MG/ML IJ SOLN
5.0000 mg | Freq: Once | INTRAMUSCULAR | Status: AC
  Administered 2012-12-02: 5 mg via INTRAVENOUS
  Filled 2012-12-02: qty 2

## 2012-12-02 MED ORDER — GADOBENATE DIMEGLUMINE 529 MG/ML IV SOLN
15.0000 mL | Freq: Once | INTRAVENOUS | Status: AC
  Administered 2012-12-02: 15 mL via INTRAVENOUS

## 2012-12-02 MED ORDER — DIPHENHYDRAMINE HCL 50 MG/ML IJ SOLN
12.5000 mg | Freq: Once | INTRAMUSCULAR | Status: AC
  Administered 2012-12-02: 12.5 mg via INTRAVENOUS
  Filled 2012-12-02: qty 1

## 2012-12-02 NOTE — ED Notes (Signed)
Onalee Hua, np notified of patient in department

## 2012-12-02 NOTE — ED Notes (Signed)
Patient reports no headache when she woke this morning.  Daughter (linda) reports headache started at 10 :20 a.m., this morning while shopping.  Reports headache was gradual in onset.  "squiggly lines in vision field", fullness/pressure in ears, and neck pain.  No new congestion.  Patient reports headache on Sunday.  Headache with squiggly lines is a recurrent event, usually several months between episodes.  Patient has been evaluated for the same in the past, no diagnosis.  Patient has a triple a, reevaluated last week, has grown since last assessment.

## 2012-12-02 NOTE — ED Provider Notes (Signed)
History     CSN: [id number]  Arrival date & time 12/02/12  1046   None     Chief Complaint  Patient presents with  . Headache    (Consider location/radiation/quality/duration/timing/severity/associated sxs/prior treatment) HPI Comments: 78 year old female presents for evaluation of a sudden onset of headache this morning while shopping. 48 hours ago she was in church also had a sudden episode of headache associated with lightheadedness and had to leave the church to go home. Today she was shopping felt weak and lightheaded and developed the sudden headache in the vertex, occiput and neck. There is a feeling of pressure between the ears. She denies syncope but says there were vision changes such as "zigzags". She admits to having these visual changes intermittently for the past several months. She feels better in reguards to her headache pain and she is alert and oriented during the exam. Her vital signs are stable.   Past Medical History  Diagnosis Date  . Osteoarthritis     spine/spinal stenosis  . GERD (gastroesophageal reflux disease)   . Hemorrhoids   . IC (irritable colon)     DMSO in past  . Allergy     allergic rhinitis  . IBS (irritable bowel syndrome)   . Diverticulosis   . CAD (coronary artery disease) 07/2009    mod by cath 08/10  . AAA (abdominal aortic aneurysm)     followed by Dr Darrick Penna. Last u/s07/10 stable AAA with largest measurement 3.97 x 4.02cm.  . Cancer     basal cell cancer - face.  . Osteopenia 2011  . Hyperlipidemia   . Depression   . Asthma     Past Surgical History  Procedure Date  . Right colectomy 1997    benign  . Abdominal hysterectomy     partial not cancer  . Cystocele repair   . Rectocele repair   . Hernia repair 11/1997    umbilical hernia  . Esophagogastroduodenoscopy 09/1998    erosive esphagitis ? stricture treated 12/2002  . Refractive surgery     for film after cataract surgery.  . Skin lesion excision 05/2011    basal  cell skin lesion removed.    Family History  Problem Relation Age of Onset  . Heart disease Mother     deceased age 58 heart attack.  Marland Kitchen Heart disease Father     age 42 heart attack  . Cancer Father   . Stroke Brother     History  Substance Use Topics  . Smoking status: Never Smoker   . Smokeless tobacco: Never Used  . Alcohol Use: No    OB History    Grav Para Term Preterm Abortions TAB SAB Ect Mult Living                  Review of Systems  Constitutional: Positive for activity change. Negative for fever and fatigue.  HENT: Positive for neck pain. Negative for hearing loss, ear pain, sore throat, facial swelling, rhinorrhea and postnasal drip.   Respiratory: Negative.   Cardiovascular: Negative.  Negative for chest pain, palpitations and leg swelling.  Gastrointestinal: Negative.   Genitourinary: Negative.   Skin: Negative.   Neurological: Positive for dizziness, facial asymmetry, light-headedness and headaches. Negative for seizures, syncope, speech difficulty and numbness.    Allergies  Amoxicillin-pot clavulanate; Ibuprofen; Keflex; Rosuvastatin; Sertraline hcl; and Tetanus toxoids  Home Medications   Current Outpatient Rx  Name  Route  Sig  Dispense  Refill  . ACETAMINOPHEN 500  MG PO TABS      1-3 by mouth as needed.          . ALBUTEROL SULFATE HFA 108 (90 BASE) MCG/ACT IN AERS   Inhalation   Inhale 2 puffs into the lungs every 4 (four) hours as needed for wheezing. 2 puffs up to every 4 hours as needed for wheezing   1 Inhaler   11   . BECLOMETHASONE DIPROPIONATE 40 MCG/ACT IN AERS   Inhalation   Inhale 2 puffs into the lungs 2 (two) times daily.         Marland Kitchen CALCIUM CARBONATE 1250 MG PO CHEW      1-2 by mouth as needed.          Marland Kitchen CETIRIZINE HCL 10 MG PO TABS      OTC as directed.          Marland Kitchen ESOMEPRAZOLE MAGNESIUM 40 MG PO CPDR   Oral   Take 40 mg by mouth daily before breakfast.         . FLUTICASONE PROPIONATE 50 MCG/ACT NA SUSP    Nasal   2 sprays by Nasal route daily. As needed.          Marland Kitchen LATANOPROST 0.005 % OP SOLN   Both Eyes   Place 1 drop into both eyes at bedtime.           Marland Kitchen POLYETHYLENE GLYCOL 3350 PO PACK   Oral   Take 17 g by mouth daily.           Marland Kitchen HEALTHY COLON PO CAPS      One daily as needed.          Marland Kitchen RANITIDINE HCL 150 MG PO TABS   Oral   Take 150 mg by mouth at bedtime.           BP 154/72  Pulse 83  Temp 98.2 F (36.8 C) (Oral)  Resp 16  SpO2 98%  LMP 12/17/1968  Physical Exam  Constitutional: She is oriented to person, place, and time. She appears well-developed and well-nourished. No distress.  HENT:  Nose: Nose normal.  Mouth/Throat: Oropharynx is clear and moist. No oropharyngeal exudate.       Bilateral TMs are normal   Eyes: EOM are normal. Pupils are equal, round, and reactive to light.  Neck: Normal range of motion. Neck supple.       Carotid pulses 2+ bilaterally no bruits are appreciated.  Cardiovascular: Normal rate, regular rhythm and normal heart sounds.   Pulmonary/Chest: Effort normal. No respiratory distress. She has no wheezes.  Abdominal: Soft. There is no tenderness.  Musculoskeletal: She exhibits no tenderness.       Normal strength for age and symmetric.  Lymphadenopathy:    She has no cervical adenopathy.  Neurological: She is alert and oriented to person, place, and time. No cranial nerve deficit. She exhibits normal muscle tone.       With cranial nerve 7 check there is mild asymmetry of the right facial muscles and that there is a slight droop on the right compared to the left. Neither the patient or the daughter had noticed this in the more recent past. The patient states that she had noted it but does not know the the approximate time  Skin: Skin is warm and dry.  Psychiatric: She has a normal mood and affect.    ED Course  Procedures (including critical care time)  Labs Reviewed - No data to display No results  found.   1.  Headache   2. Aneurysm of abdominal aorta   3. Episodic lightheadedness       MDM  The patient is stable at this time. The only objective findings are photophobia and slight droop  the right facial muscle. She is set high risk for other vascular problems and the headache is not otherwise explained. It is prudent that she be evaluated for potential cerebrovascular etiology for her headaches and lightheadedness. She was sent to the emergency department via shuttle. Her daughter will accompany her        Hayden Rasmussen, NP 12/02/12 1213

## 2012-12-02 NOTE — ED Notes (Addendum)
20G PIV RAC removed without difficulty prior to pt discharge, catheter intact, pt tolerated well, dressing placed

## 2012-12-02 NOTE — ED Provider Notes (Signed)
Medical screening examination/treatment/procedure(s) were performed by non-physician practitioner and as supervising physician I was immediately available for consultation/collaboration.  Raynald Blend, MD 12/02/12 1221

## 2012-12-02 NOTE — ED Notes (Signed)
Placed call for Reg Diet

## 2012-12-02 NOTE — ED Notes (Signed)
Off the floor to MRI

## 2012-12-02 NOTE — ED Notes (Signed)
Pt A.O. X 4. Sitting up eating dinner tray. Updated on plan of care: Aware that MRI results are still pending and when available EDP will discuss results with patient. NAD. Call light in reach. No further needs at this time.

## 2012-12-02 NOTE — ED Notes (Signed)
Dr. Knapp in room at this time.  

## 2012-12-02 NOTE — ED Notes (Addendum)
Patient with complaints of headache and zig zags in her eyes prior to headache.  Patient with similar sx on Sunday.  Patient family concerned due to 2 episodes so close together.  Patient states she continues to have headache and ear pain but the vision changes have resolved.  Patient denies nausea.  Patient was seen in our urgent care and transferred to ED for further eval

## 2012-12-02 NOTE — ED Provider Notes (Signed)
History     CSN: [id number]  Arrival date & time 12/02/12  1239   First MD Initiated Contact with Patient 12/02/12 1503      Chief Complaint  Patient presents with  . Headache    (Consider location/radiation/quality/duration/timing/severity/associated sxs/prior treatment) HPI  Patient reports 2 days ago she had a yeast exactly light display in both eyes followed by vision loss for short period of time and headache. She was seen in urgent care. Without a diagnosis being given. She reports she's been seen as exactly like this for a couple of years. She states episode she uses several months apart. She normally takes Tylenol and lays down for a while and it goes away. She is seeing her ophthalmologist with no diagnosis given. She states today about 1020 they were shopping and she has been zigzagging of lights in both eyes for a few minutes and then she started getting a headache described as aching and pressure behind her eyeballs and in her years. She denies numbness or tingling in her extremities, she denies chest pain or shortness of breath. She states she had nausea earlier but not now. Patient has never been evaluated by neurology. Patient denies history of migraine headaches when she was younger, she also denies any family history of headaches.  Patient reports she was seen last week and she has a AAA is followed by Dr. Darrick Penna. She reports her aneurysm has increased to 4.9 cm.  PCP Dr Milinda Antis  Past Medical History  Diagnosis Date  . Osteoarthritis     spine/spinal stenosis  . GERD (gastroesophageal reflux disease)   . Hemorrhoids   . IC (irritable colon)     DMSO in past  . Allergy     allergic rhinitis  . IBS (irritable bowel syndrome)   . Diverticulosis   . CAD (coronary artery disease) 07/2009    mod by cath 08/10  . AAA (abdominal aortic aneurysm)     followed by Dr Darrick Penna. Last u/s07/10 stable AAA with largest measurement 3.97 x 4.02cm.  . Cancer     basal cell cancer -  face.  . Osteopenia 2011  . Hyperlipidemia   . Depression   . Asthma     Past Surgical History  Procedure Date  . Right colectomy 1997    benign  . Abdominal hysterectomy     partial not cancer  . Cystocele repair   . Rectocele repair   . Hernia repair 11/1997    umbilical hernia  . Esophagogastroduodenoscopy 09/1998    erosive esphagitis ? stricture treated 12/2002  . Refractive surgery     for film after cataract surgery.  . Skin lesion excision 05/2011    basal cell skin lesion removed.    Family History  Problem Relation Age of Onset  . Heart disease Mother     deceased age 78 heart attack.  Marland Kitchen Heart disease Father     age 39 heart attack  . Cancer Father   . Stroke Brother     History  Substance Use Topics  . Smoking status: Never Smoker   . Smokeless tobacco: Never Used  . Alcohol Use: No  Lives at home Lives alone  OB History    Grav Para Term Preterm Abortions TAB SAB Ect Mult Living                  Review of Systems  All other systems reviewed and are negative.    Allergies  Amoxicillin-pot clavulanate; Ibuprofen; Keflex;  Rosuvastatin; Sertraline hcl; and Tetanus toxoids  Home Medications   Current Outpatient Rx  Name  Route  Sig  Dispense  Refill  . ACETAMINOPHEN 500 MG PO TABS   Oral   Take 500-1,000 mg by mouth every 4 (four) hours as needed. For pain         . ALBUTEROL SULFATE HFA 108 (90 BASE) MCG/ACT IN AERS   Inhalation   Inhale 2 puffs into the lungs every 4 (four) hours as needed for wheezing. 2 puffs up to every 4 hours as needed for wheezing   1 Inhaler   11   . BECLOMETHASONE DIPROPIONATE 40 MCG/ACT IN AERS   Inhalation   Inhale 2 puffs into the lungs 2 (two) times daily.         Marland Kitchen CETIRIZINE HCL 10 MG PO TABS   Oral   Take 10 mg by mouth daily as needed. For allergies         . ESOMEPRAZOLE MAGNESIUM 40 MG PO CPDR   Oral   Take 40 mg by mouth daily before breakfast.         . FLUTICASONE PROPIONATE 50  MCG/ACT NA SUSP   Nasal   2 sprays by Nasal route daily. As needed.          Marland Kitchen LATANOPROST 0.005 % OP SOLN   Both Eyes   Place 1 drop into both eyes at bedtime.           Marland Kitchen POLYETHYLENE GLYCOL 3350 PO PACK   Oral   Take 17 g by mouth daily as needed. For constipation         . HEALTHY COLON PO CAPS   Oral   Take 1 capsule by mouth daily as needed.          Marland Kitchen RANITIDINE HCL 150 MG PO TABS   Oral   Take 150 mg by mouth at bedtime.           BP 146/75  Pulse 72  Temp 97.8 F (36.6 C) (Oral)  Resp 20  Ht 5' 6.5" (1.689 m)  Wt 167 lb (75.751 kg)  BMI 26.55 kg/m2  SpO2 99%  LMP 12/17/1968  Vital signs normal    Physical Exam  Nursing note and vitals reviewed. Constitutional: She is oriented to person, place, and time. She appears well-developed and well-nourished.  Non-toxic appearance. She does not appear ill. No distress.  HENT:  Head: Normocephalic and atraumatic.  Right Ear: External ear normal.  Left Ear: External ear normal.  Nose: Nose normal. No mucosal edema or rhinorrhea.  Mouth/Throat: Oropharynx is clear and moist and mucous membranes are normal. No dental abscesses or uvula swelling.  Eyes: Conjunctivae normal and EOM are normal. Pupils are equal, round, and reactive to light.  Neck: Normal range of motion and full passive range of motion without pain. Neck supple.  Cardiovascular: Normal rate, regular rhythm and normal heart sounds.  Exam reveals no gallop and no friction rub.   No murmur heard. Pulmonary/Chest: Effort normal and breath sounds normal. No respiratory distress. She has no wheezes. She has no rhonchi. She has no rales. She exhibits no tenderness and no crepitus.  Abdominal: Soft. Normal appearance and bowel sounds are normal. She exhibits no distension. There is no tenderness. There is no rebound and no guarding.  Musculoskeletal: Normal range of motion. She exhibits no edema and no tenderness.       Moves all extremities well.    Neurological: She is  alert and oriented to person, place, and time. She has normal strength. No cranial nerve deficit.       Grips are equal bilaterally, she has mild facial droop of the right side of her mouth. She has no motor deficit in her arms or legs. Finger to nose is intact bilaterally  Skin: Skin is warm, dry and intact. No rash noted. No erythema. No pallor.  Psychiatric: She has a normal mood and affect. Her speech is normal and behavior is normal. Her mood appears not anxious.    ED Course  Procedures (including critical care time)   Medications  sodium chloride 0.9 % bolus 500 mL (0 mL Intravenous Stopped 12/02/12 1829)  metoCLOPramide (REGLAN) injection 5 mg (5 mg Intravenous Given 12/02/12 1555)  diphenhydrAMINE (BENADRYL) injection 12.5 mg (12.5 mg Intravenous Given 12/02/12 1554)  gadobenate dimeglumine (MULTIHANCE) injection 15 mL (15 mL Intravenous Contrast Given 12/02/12 1928)   17:30 headache gone, c/o back stiffness from sitting, MRI states it will be another 1 1/2-2 hours before she can be done.   MRI has resulted ready to go home.   Results for orders placed during the hospital encounter of 12/02/12  CBC      Component Value Range   WBC 8.7  4.0 - 10.5 K/uL   RBC 4.46  3.87 - 5.11 MIL/uL   Hemoglobin 12.7  12.0 - 15.0 g/dL   HCT 16.1  09.6 - 04.5 %   MCV 90.6  78.0 - 100.0 fL   MCH 28.5  26.0 - 34.0 pg   MCHC 31.4  30.0 - 36.0 g/dL   RDW 40.9  81.1 - 91.4 %   Platelets 215  150 - 400 K/uL  COMPREHENSIVE METABOLIC PANEL      Component Value Range   Sodium 142  135 - 145 mEq/L   Potassium 4.1  3.5 - 5.1 mEq/L   Chloride 105  96 - 112 mEq/L   CO2 29  19 - 32 mEq/L   Glucose, Bld 93  70 - 99 mg/dL   BUN 19  6 - 23 mg/dL   Creatinine, Ser 7.82  0.50 - 1.10 mg/dL   Calcium 9.3  8.4 - 95.6 mg/dL   Total Protein 6.9  6.0 - 8.3 g/dL   Albumin 3.4 (*) 3.5 - 5.2 g/dL   AST 12  0 - 37 U/L   ALT 9  0 - 35 U/L   Alkaline Phosphatase 76  39 - 117 U/L   Total  Bilirubin 0.3  0.3 - 1.2 mg/dL   GFR calc non Af Amer 60 (*) >90 mL/min   GFR calc Af Amer 69 (*) >90 mL/min  URINALYSIS, MICROSCOPIC ONLY      Component Value Range   Color, Urine YELLOW  YELLOW   APPearance CLEAR  CLEAR   Specific Gravity, Urine 1.009  1.005 - 1.030   pH 6.0  5.0 - 8.0   Glucose, UA NEGATIVE  NEGATIVE mg/dL   Hgb urine dipstick SMALL (*) NEGATIVE   Bilirubin Urine NEGATIVE  NEGATIVE   Ketones, ur NEGATIVE  NEGATIVE mg/dL   Protein, ur NEGATIVE  NEGATIVE mg/dL   Urobilinogen, UA 0.2  0.0 - 1.0 mg/dL   Nitrite POSITIVE (*) NEGATIVE   Leukocytes, UA MODERATE (*) NEGATIVE   WBC, UA 7-10  <3 WBC/hpf   RBC / HPF 0-2  <3 RBC/hpf   Bacteria, UA MANY (*) RARE   Squamous Epithelial / LPF FEW (*) RARE  TROPONIN I  Component Value Range   Troponin I <0.30  <0.30 ng/mL  APTT      Component Value Range   aPTT 30  24 - 37 seconds  PROTIME-INR      Component Value Range   Prothrombin Time 12.8  11.6 - 15.2 seconds   INR 0.97  0.00 - 1.49   Laboratory interpretation all normal except possible UTI    Mr Laqueta Jean Wo Contrast  12/02/2012  *RADIOLOGY REPORT*  Clinical Data: Headache.  Right facial droop.  Visual disturbance.  MRI HEAD WITHOUT AND WITH CONTRAST  Technique:  Multiplanar, multiecho pulse sequences of the brain and surrounding structures were obtained according to standard protocol without and with intravenous contrast  Contrast: 15mL MULTIHANCE GADOBENATE DIMEGLUMINE 529 MG/ML IV SOLN  Comparison: None.  Findings: No acute infarct.  Minimal asymmetry of the posterior limb of the internal capsules on diffusion sequence is within range normal limits.  Prominent small vessel disease type changes.  No intracranial hemorrhage.  Global atrophy without hydrocephalus.  Left frontal extra-axial 7.2 mm lesion consistent with small meningioma otherwise no evidence of intracranial mass or abnormal enhancement.  Major intracranial vascular structures are patent.  IMPRESSION:  No acute infarct.  Prominent small vessel disease type changes.  Sub centimeter left frontal lobe meningioma without associated mass effect.   Original Report Authenticated By: Lacy Duverney, M.D.      Date: 12/02/2012  Rate: 69  Rhythm: normal sinus rhythm  QRS Axis: LAD  Intervals: PR prolonged  ST/T Wave abnormalities: normal  Conduction Disutrbances:none  Narrative Interpretation:   Old EKG Reviewed: unchanged from 08/24/2009    1. Migraine headache     Plan discharge  Devoria Albe, MD, FACEP   MDM          Ward Givens, MD 12/02/12 2035

## 2012-12-04 LAB — URINE CULTURE

## 2012-12-05 NOTE — ED Notes (Addendum)
+  Urine. Chart sent to EDP office for review. Chart returned from EDP office. Prescribed Cipro 250 mg PO BID #6. No refills. Prescribed by Dahlia Client Muthersbaugh PA-C.

## 2012-12-07 ENCOUNTER — Telehealth (HOSPITAL_COMMUNITY): Payer: Self-pay | Admitting: Emergency Medicine

## 2012-12-07 NOTE — ED Notes (Signed)
+   urine culture. Patient notified, RX Cipro called to [facility] [phone]

## 2013-01-05 ENCOUNTER — Telehealth: Payer: Self-pay | Admitting: Family Medicine

## 2013-01-05 DIAGNOSIS — I2510 Atherosclerotic heart disease of native coronary artery without angina pectoris: Secondary | ICD-10-CM

## 2013-01-05 DIAGNOSIS — E7800 Pure hypercholesterolemia, unspecified: Secondary | ICD-10-CM

## 2013-01-05 DIAGNOSIS — K219 Gastro-esophageal reflux disease without esophagitis: Secondary | ICD-10-CM

## 2013-01-05 NOTE — Telephone Encounter (Signed)
Message copied by Judy Pimple on Mon Jan 05, 2013  8:44 PM ------      Message from: Baldomero Lamy      Created: Wed Dec 31, 2012 10:10 AM      Regarding: Cpx labs Tues 1/21       Please order  future cpx labs for pt's upcoming lab appt.      Thanks      Rodney Booze

## 2013-01-06 ENCOUNTER — Other Ambulatory Visit (INDEPENDENT_AMBULATORY_CARE_PROVIDER_SITE_OTHER): Payer: Medicare Other

## 2013-01-06 DIAGNOSIS — K219 Gastro-esophageal reflux disease without esophagitis: Secondary | ICD-10-CM

## 2013-01-06 DIAGNOSIS — E7800 Pure hypercholesterolemia, unspecified: Secondary | ICD-10-CM | POA: Diagnosis not present

## 2013-01-06 DIAGNOSIS — I2510 Atherosclerotic heart disease of native coronary artery without angina pectoris: Secondary | ICD-10-CM

## 2013-01-06 LAB — CBC WITH DIFFERENTIAL/PLATELET
Basophils Absolute: 0.1 10*3/uL (ref 0.0–0.1)
Hemoglobin: 13.4 g/dL (ref 12.0–15.0)
Lymphocytes Relative: 33.1 % (ref 12.0–46.0)
Monocytes Relative: 8.3 % (ref 3.0–12.0)
Neutrophils Relative %: 56.2 % (ref 43.0–77.0)
Platelets: 238 10*3/uL (ref 150.0–400.0)
RDW: 14 % (ref 11.5–14.6)

## 2013-01-06 LAB — COMPREHENSIVE METABOLIC PANEL
ALT: 13 U/L (ref 0–35)
AST: 16 U/L (ref 0–37)
CO2: 29 mEq/L (ref 19–32)
Calcium: 9.3 mg/dL (ref 8.4–10.5)
Chloride: 106 mEq/L (ref 96–112)
GFR: 73.84 mL/min (ref >60.00)
Sodium: 140 mEq/L (ref 135–145)
Total Protein: 7.2 g/dL (ref 6.0–8.3)

## 2013-01-06 LAB — LDL CHOLESTEROL, DIRECT: Direct LDL: 150.5 mg/dL

## 2013-01-06 LAB — LIPID PANEL: VLDL: 35.6 mg/dL (ref 0.0–40.0)

## 2013-01-13 ENCOUNTER — Encounter: Payer: Self-pay | Admitting: Family Medicine

## 2013-01-13 ENCOUNTER — Ambulatory Visit (INDEPENDENT_AMBULATORY_CARE_PROVIDER_SITE_OTHER): Payer: Medicare Other | Admitting: Family Medicine

## 2013-01-13 VITALS — BP 136/82 | HR 84 | Temp 97.8°F | Ht 65.0 in | Wt 170.2 lb

## 2013-01-13 DIAGNOSIS — E7800 Pure hypercholesterolemia, unspecified: Secondary | ICD-10-CM | POA: Diagnosis not present

## 2013-01-13 DIAGNOSIS — M8580 Other specified disorders of bone density and structure, unspecified site: Secondary | ICD-10-CM | POA: Insufficient documentation

## 2013-01-13 DIAGNOSIS — M949 Disorder of cartilage, unspecified: Secondary | ICD-10-CM | POA: Diagnosis not present

## 2013-01-13 DIAGNOSIS — M899 Disorder of bone, unspecified: Secondary | ICD-10-CM | POA: Diagnosis not present

## 2013-01-13 DIAGNOSIS — F329 Major depressive disorder, single episode, unspecified: Secondary | ICD-10-CM

## 2013-01-13 NOTE — Assessment & Plan Note (Signed)
Doing much better lately-no medicine, has a good support system and does not think she is depressed any more

## 2013-01-13 NOTE — Assessment & Plan Note (Signed)
Disc goals for lipids and reasons to control them Rev labs with pt Rev low sat fat diet in detail Pt declines tx  Is improved from last year

## 2013-01-13 NOTE — Patient Instructions (Addendum)
If you are interested in shingles vaccine in future - call your insurance company to see how coverage is and call us to schedule Take care of yourself  Try to eat a balanced diet and stay active

## 2013-01-13 NOTE — Assessment & Plan Note (Signed)
Pt will check with [facility] to see when last dexa was  On ca and D No falls or fx

## 2013-01-13 NOTE — Progress Notes (Signed)
Subjective:    Patient ID: Ashlee Reyes, female    DOB: 1921-04-11, 79 y.o.   MRN: [id number]  HPI Here for check up of chronic medical conditions and to review health mt list  Feels fine overall  Staying active as she can - she ambulates on her own - walks quite a bit  She has chronic pain from her back - has to push through Got new shoes for her bunions -they are heavy  Wt is up 3 lb - does like to snack - diet is fair  Is balanced and does get some protein   Zoster status-is not interested at this time   Pneumonia vaccine has had one since 1- is up to date within 2-3 years   colonosc 2/04- diverticulosis Does not want to do a colonoscopy   Osteopenia Ca and D  dexa 01-poss had one at [facility] 2y ago-she will check on that  No fractures    mammo 11/03-does not want further mammograms - would not treat breast cancer if she had it  Declines breast exam also  Self exam- no lumps or changes  Had lumpectomy for B9 lump in the past  Gyn was 2011 [facility] 2011   Hx falls- tripped once but did not fall- considers herself steady however   Hx mood has been - fairly ok -- only gets down when she is in pain and springs back quickly  Hyperlipidemia Lab Results  Component Value Date   CHOL 233* 01/06/2013   CHOL 247* 12/05/2010   CHOL  Value: 197        ATP III CLASSIFICATION:  <200     mg/dL   Desirable  409-811  mg/dL   Borderline High  >=914    mg/dL   High        7/82/9562   Lab Results  Component Value Date   HDL 41.10 01/06/2013   HDL 13.08 12/05/2010   HDL 36* 07/15/2009   Lab Results  Component Value Date   LDLCALC  Value: 129        Total Cholesterol/HDL:CHD Risk Coronary Heart Disease Risk Table                     Men   Women  1/2 Average Risk   3.4   3.3  Average Risk       5.0   4.4  2 X Average Risk   9.6   7.1  3 X Average Risk  23.4   11.0        Use the calculated Patient Ratio above and the CHD Risk Table to determine the patient's CHD Risk.        ATP III  CLASSIFICATION (LDL):  <100     mg/dL   Optimal  657-846  mg/dL   Near or Above                    Optimal  130-159  mg/dL   Borderline  962-952  mg/dL   High  >841     mg/dL   Very High* 03/09/4009   Lab Results  Component Value Date   TRIG 178.0* 01/06/2013   TRIG 144.0 12/05/2010   TRIG 159* 07/15/2009   Lab Results  Component Value Date   CHOLHDL 6 01/06/2013   CHOLHDL 5 12/05/2010   CHOLHDL 5.5 07/15/2009   Lab Results  Component Value Date   LDLDIRECT 150.5 01/06/2013   LDLDIRECT 173.9 12/05/2010  came down a bit  Does not want to treat this - but is improved    Reactive airway dz -no problems/ breathing is fine   Patient Active Problem List  Diagnosis  . HYPERCHOLESTEROLEMIA  . DISORDER, DEPRESSIVE NEC  . MIXED HEARING LOSS BILATERAL  . C A D  . REACTIVE AIRWAY DISEASE  . GERD  . DUODENITIS  . HIATAL HERNIA  . DIVERTICULITIS, COLON W/O HEM  . IBS  . INTERSTITIAL CYSTITIS  . OSTEOARTHRITIS  . DEGENERATIVE JOINT DISEASE, LUMBAR SPINE  . POSTNASAL DRIP SYNDROME  . ALLERGY  . HX, URINARY INFECTION  . Chest pain  . Abdominal aortic aneurysm  . Leaking abdominal aortic aneurysm  . Bacterial skin infection of upper extremity  . Local reaction to immunization  . Osteopenia   Past Medical History  Diagnosis Date  . Osteoarthritis     spine/spinal stenosis  . GERD (gastroesophageal reflux disease)   . Hemorrhoids   . IC (irritable colon)     DMSO in past  . Allergy     allergic rhinitis  . IBS (irritable bowel syndrome)   . Diverticulosis   . CAD (coronary artery disease) 07/2009    mod by cath 08/10  . AAA (abdominal aortic aneurysm)     followed by Dr Darrick Penna. Last u/s07/10 stable AAA with largest measurement 3.97 x 4.02cm.  . Cancer     basal cell cancer - face.  . Osteopenia 2011  . Hyperlipidemia   . Depression   . Asthma    Past Surgical History  Procedure Date  . Right colectomy 1997    benign  . Abdominal hysterectomy     partial not cancer    . Cystocele repair   . Rectocele repair   . Hernia repair 11/1997    umbilical hernia  . Esophagogastroduodenoscopy 09/1998    erosive esphagitis ? stricture treated 12/2002  . Refractive surgery     for film after cataract surgery.  . Skin lesion excision 05/2011    basal cell skin lesion removed.   History  Substance Use Topics  . Smoking status: Never Smoker   . Smokeless tobacco: Never Used  . Alcohol Use: No   Family History  Problem Relation Age of Onset  . Heart disease Mother     deceased age 82 heart attack.  Marland Kitchen Heart disease Father     age 50 heart attack  . Cancer Father   . Stroke Brother    Allergies  Allergen Reactions  . Amoxicillin-Pot Clavulanate     REACTION: sick  . Avelox (Moxifloxacin Hcl In Nacl) Nausea Only  . Ibuprofen     REACTION: GI  . Keflex (Cephalexin)     abd pain / GI upset  . Omeprazole Nausea Only  . Rosuvastatin     REACTION: foot pain  . Sertraline Hcl     REACTION: more depressed  . Tetanus Toxoids     Local redness and swelling    Current Outpatient Prescriptions on File Prior to Visit  Medication Sig Dispense Refill  . acetaminophen (TYLENOL) 500 MG tablet Take 500-1,000 mg by mouth every 4 (four) hours as needed. For pain      . albuterol (PROAIR HFA) 108 (90 BASE) MCG/ACT inhaler Inhale 2 puffs into the lungs every 4 (four) hours as needed for wheezing. 2 puffs up to every 4 hours as needed for wheezing  1 Inhaler  11  . beclomethasone (QVAR) 40 MCG/ACT inhaler Inhale 2 puffs into  the lungs 2 (two) times daily.      . cetirizine (ZYRTEC) 10 MG tablet Take 10 mg by mouth daily as needed. For allergies      . esomeprazole (NEXIUM) 40 MG capsule Take 40 mg by mouth daily before breakfast.      . ipratropium (ATROVENT) 0.03 % nasal spray Place 2 sprays into the nose as needed.      . latanoprost (XALATAN) 0.005 % ophthalmic solution Place 1 drop into both eyes at bedtime.        . polyethylene glycol (MIRALAX / GLYCOLAX) packet  Take 17 g by mouth daily as needed. For constipation      . Probiotic Product (HEALTHY COLON) CAPS Take 1 capsule by mouth daily as needed.       . ranitidine (ZANTAC) 150 MG tablet Take 150 mg by mouth at bedtime.           Review of Systems Review of Systems  Constitutional: Negative for fever, appetite change, fatigue and unexpected weight change.  Eyes: Negative for pain and visual disturbance.  Respiratory: Negative for cough and shortness of breath.   Cardiovascular: Negative for cp or palpitations    Gastrointestinal: Negative for nausea, diarrhea and constipation.  Genitourinary: Negative for urgency and frequency.  Skin: Negative for pallor or rash   Neurological: Negative for weakness, light-headedness, numbness and headaches.  Hematological: Negative for adenopathy. Does not bruise/bleed easily.  Psychiatric/Behavioral: Negative for dysphoric mood. The patient is not nervous/anxious.         Objective:   Physical Exam  Constitutional: She appears well-developed and well-nourished. No distress.       Well appearing   HENT:  Head: Normocephalic and atraumatic.  Right Ear: External ear normal.  Left Ear: External ear normal.  Nose: Nose normal.  Mouth/Throat: Oropharynx is clear and moist.  Eyes: Conjunctivae normal and EOM are normal. Pupils are equal, round, and reactive to light. Right eye exhibits no discharge. Left eye exhibits no discharge. No scleral icterus.  Neck: Normal range of motion. Neck supple. No JVD present. Carotid bruit is not present. No thyromegaly present.  Cardiovascular: Normal rate, regular rhythm, normal heart sounds and intact distal pulses.  Exam reveals no gallop.   Pulmonary/Chest: Effort normal and breath sounds normal. No respiratory distress. She has no wheezes.  Abdominal: Soft. Bowel sounds are normal. She exhibits no distension, no abdominal bruit and no mass. There is no tenderness.  Musculoskeletal: She exhibits no edema and no  tenderness.  Lymphadenopathy:    She has no cervical adenopathy.  Neurological: She is alert. She has normal reflexes. No cranial nerve deficit. She exhibits normal muscle tone. Coordination normal.  Skin: Skin is warm and dry. No rash noted. No erythema. No pallor.  Psychiatric: She has a normal mood and affect.          Assessment & Plan:

## 2013-01-22 ENCOUNTER — Ambulatory Visit (INDEPENDENT_AMBULATORY_CARE_PROVIDER_SITE_OTHER): Payer: Medicare Other | Admitting: Internal Medicine

## 2013-01-22 ENCOUNTER — Encounter: Payer: Self-pay | Admitting: Internal Medicine

## 2013-01-22 ENCOUNTER — Telehealth: Payer: Self-pay

## 2013-01-22 VITALS — BP 128/70 | HR 92 | Temp 98.0°F | Wt 171.0 lb

## 2013-01-22 DIAGNOSIS — A084 Viral intestinal infection, unspecified: Secondary | ICD-10-CM

## 2013-01-22 DIAGNOSIS — A088 Other specified intestinal infections: Secondary | ICD-10-CM

## 2013-01-22 NOTE — Telephone Encounter (Signed)
Will check at appt

## 2013-01-22 NOTE — Patient Instructions (Signed)
Please try Pedialyte to rehydrate yourself

## 2013-01-22 NOTE — Telephone Encounter (Signed)
Pt has had  Watery diarrhea x 10 times today with abdominal pain;  Pt started on 01/20/13 with diarrhea and nausea. No fever known. Pts daughter [name] wants appt. Pt has taken Pepto with no relief.pt to see Dr Alphonsus Sias today at 3:15pm. (Dr Milinda Antis out of office).

## 2013-01-22 NOTE — Progress Notes (Signed)
Subjective:    Patient ID: Ashlee Reyes, female    DOB: 16-Nov-1921, 79 y.o.   MRN: [id number]  HPI Here with daughter [name]  "My guts are burning inside" Pain in lower abdomen and left side Diarrhea--since yesterday AM.  Frequently and seems watery or loose No blood  Tried liquid diet yesterday and pepto bismol Slightly better then Tried scrambled eggs this AM--made her worse  Daughter had cold--but not GI symptoms No apparent suspect food  No recent travel No recent antibiotics  Current Outpatient Prescriptions on File Prior to Visit  Medication Sig Dispense Refill  . acetaminophen (TYLENOL) 500 MG tablet Take 500-1,000 mg by mouth every 4 (four) hours as needed. For pain      . albuterol (PROAIR HFA) 108 (90 BASE) MCG/ACT inhaler Inhale 2 puffs into the lungs every 4 (four) hours as needed for wheezing. 2 puffs up to every 4 hours as needed for wheezing  1 Inhaler  11  . beclomethasone (QVAR) 40 MCG/ACT inhaler Inhale 2 puffs into the lungs 2 (two) times daily.      . cetirizine (ZYRTEC) 10 MG tablet Take 10 mg by mouth daily as needed. For allergies      . esomeprazole (NEXIUM) 40 MG capsule Take 40 mg by mouth daily before breakfast.      . ipratropium (ATROVENT) 0.03 % nasal spray Place 2 sprays into the nose as needed.      . latanoprost (XALATAN) 0.005 % ophthalmic solution Place 1 drop into both eyes at bedtime.        . polyethylene glycol (MIRALAX / GLYCOLAX) packet Take 17 g by mouth daily as needed. For constipation      . Probiotic Product (HEALTHY COLON) CAPS Take 1 capsule by mouth daily as needed.       . ranitidine (ZANTAC) 150 MG tablet Take 150 mg by mouth at bedtime.        Allergies  Allergen Reactions  . Amoxicillin-Pot Clavulanate     REACTION: sick  . Avelox (Moxifloxacin Hcl In Nacl) Nausea Only  . Ibuprofen     REACTION: GI  . Keflex (Cephalexin)     abd pain / GI upset  . Omeprazole Nausea Only  . Rosuvastatin     REACTION: foot pain  .  Sertraline Hcl     REACTION: more depressed  . Tetanus Toxoids     Local redness and swelling     Past Medical History  Diagnosis Date  . Osteoarthritis     spine/spinal stenosis  . GERD (gastroesophageal reflux disease)   . Hemorrhoids   . IC (irritable colon)     DMSO in past  . Allergy     allergic rhinitis  . IBS (irritable bowel syndrome)   . Diverticulosis   . CAD (coronary artery disease) 07/2009    mod by cath 08/10  . AAA (abdominal aortic aneurysm)     followed by Dr Darrick Penna. Last u/s07/10 stable AAA with largest measurement 3.97 x 4.02cm.  . Cancer     basal cell cancer - face.  . Osteopenia 2011  . Hyperlipidemia   . Depression   . Asthma     Past Surgical History  Procedure Date  . Right colectomy 1997    benign  . Abdominal hysterectomy     partial not cancer  . Cystocele repair   . Rectocele repair   . Hernia repair 11/1997    umbilical hernia  . Esophagogastroduodenoscopy 09/1998  erosive esphagitis ? stricture treated 12/2002  . Refractive surgery     for film after cataract surgery.  . Skin lesion excision 05/2011    basal cell skin lesion removed.    Family History  Problem Relation Age of Onset  . Heart disease Mother     deceased age 32 heart attack.  Marland Kitchen Heart disease Father     age 24 heart attack  . Cancer Father   . Stroke Brother     History   Social History  . Marital Status: Married    Spouse Name: N/A    Number of Children: N/A  . Years of Education: N/A   Occupational History  . Not on file.   Social History Main Topics  . Smoking status: Never Smoker   . Smokeless tobacco: Never Used  . Alcohol Use: No  . Drug Use: No  . Sexually Active: Not on file   Other Topics Concern  . Not on file   Social History Narrative  . No narrative on file   Review of Systems No fever No sig achiness Did have brief lightheadedness Slight chronic cough. No SOB    Objective:   Physical Exam  Constitutional: She appears  well-developed and well-nourished. No distress.  Neck: Normal range of motion. Neck supple.  Pulmonary/Chest: Effort normal and breath sounds normal. No respiratory distress. She has no wheezes. She has no rales.  Abdominal: Soft. Bowel sounds are normal. She exhibits no distension. There is no tenderness. There is no rebound and no guarding.  Lymphadenopathy:    She has no cervical adenopathy.          Assessment & Plan:

## 2013-01-22 NOTE — Assessment & Plan Note (Signed)
Probably norovirus Discussed eating as tolerated---avoid dairy and fruit (esp juices) Try pedialyte Recheck if worsens

## 2013-02-10 DIAGNOSIS — N3000 Acute cystitis without hematuria: Secondary | ICD-10-CM | POA: Diagnosis not present

## 2013-05-19 DIAGNOSIS — H4011X0 Primary open-angle glaucoma, stage unspecified: Secondary | ICD-10-CM | POA: Diagnosis not present

## 2013-05-19 DIAGNOSIS — H0270 Unspecified degenerative disorders of eyelid and periocular area: Secondary | ICD-10-CM | POA: Diagnosis not present

## 2013-05-19 DIAGNOSIS — H409 Unspecified glaucoma: Secondary | ICD-10-CM | POA: Diagnosis not present

## 2013-05-28 ENCOUNTER — Ambulatory Visit: Payer: Medicare Other | Admitting: Neurosurgery

## 2013-06-03 ENCOUNTER — Encounter: Payer: Self-pay | Admitting: Vascular Surgery

## 2013-06-04 ENCOUNTER — Encounter: Payer: Self-pay | Admitting: Vascular Surgery

## 2013-06-04 ENCOUNTER — Encounter (INDEPENDENT_AMBULATORY_CARE_PROVIDER_SITE_OTHER): Payer: Medicare Other | Admitting: *Deleted

## 2013-06-04 ENCOUNTER — Ambulatory Visit (INDEPENDENT_AMBULATORY_CARE_PROVIDER_SITE_OTHER): Payer: Medicare Other | Admitting: Vascular Surgery

## 2013-06-04 VITALS — BP 146/70 | HR 70 | Ht 65.0 in | Wt 168.3 lb

## 2013-06-04 DIAGNOSIS — I714 Abdominal aortic aneurysm, without rupture: Secondary | ICD-10-CM

## 2013-06-04 DIAGNOSIS — Z01810 Encounter for preprocedural cardiovascular examination: Secondary | ICD-10-CM

## 2013-06-04 NOTE — Progress Notes (Signed)
Patient is an 79 year old female who presents for followup today of a known abdominal aortic aneurysm. She has chronic back pain. She denies any new abdominal or back pain. The aneurysm was 4.7 cm in December of 2013. It was originally 4 cm in diameter and 2010. Chronic medical problems include curable bowel syndrome coronary artery disease hyperlipidemia and asthma all of which are currently stable  Past Medical History  Diagnosis Date  . Osteoarthritis     spine/spinal stenosis  . GERD (gastroesophageal reflux disease)   . Hemorrhoids   . IC (irritable colon)     DMSO in past  . Allergy     allergic rhinitis  . IBS (irritable bowel syndrome)   . Diverticulosis   . CAD (coronary artery disease) 07/2009    mod by cath 08/10  . AAA (abdominal aortic aneurysm)     followed by Dr Darrick Penna. Last u/s07/10 stable AAA with largest measurement 3.97 x 4.02cm.  . Cancer     basal cell cancer - face.  . Osteopenia 2011  . Hyperlipidemia   . Depression   . Asthma    Past Surgical History  Procedure Laterality Date  . Right colectomy  1997    benign  . Abdominal hysterectomy      partial not cancer  . Cystocele repair    . Rectocele repair    . Hernia repair  11/1997    umbilical hernia  . Esophagogastroduodenoscopy  09/1998    erosive esphagitis ? stricture treated 12/2002  . Refractive surgery      for film after cataract surgery.  . Skin lesion excision  05/2011    basal cell skin lesion removed.    History   Social History  . Marital Status: Married    Spouse Name: N/A    Number of Children: N/A  . Years of Education: N/A   Occupational History  . Not on file.   Social History Main Topics  . Smoking status: Never Smoker   . Smokeless tobacco: Never Used  . Alcohol Use: No  . Drug Use: No  . Sexually Active: Not on file   Other Topics Concern  . Not on file   Social History Narrative  . No narrative on file    Family History  Problem Relation Age of Onset  .  Heart disease Mother     deceased age 71 heart attack.  Marland Kitchen Heart attack Mother   . Other Mother     varicose veins  . Heart disease Father     age 69 heart attack  . Cancer Father   . Stroke Brother   . Cancer Brother   . Heart disease Brother   . Heart attack Brother   . Cancer Daughter   . Diabetes Son    Review of systems: Cardiac has shortness of breath with exertion, pulmonary has occasional asthma flareups, neurologic bilateral lower extremity weakness, ENT has some occasional swallowing difficulty followed by Dr[facility]Vaught [facility] all other systems were reviewed and are negative  Physical exam:  Filed Vitals:   06/04/13 0914  BP: 146/70  Pulse: 70  Height: 5\' 5"  (1.651 m)  Weight: 168 lb 4.8 oz (76.34 kg)  SpO2: 100%   Neck: No carotid bruits  Chest: Clear to auscultation bilaterally  Cardiac: Regular rate and rhythm without murmur  Abdomen: Soft nontender easily palpable pulsatile mass  Extremities: 2+ femoral pulses bilaterally  Neuro: Symmetric upper she may lower extremity motor strength which is 5 over  5  Data: Her abdominal aneurysm is now 5.6 cm on ultrasound that I reviewed and interpreted today  Assessment: Enlarging abdominal aortic aneurysm which warrants repair.  Plan: #1 cardiac risk stratification #2 CT Angio the abdomen and pelvis to determine whether or not she is a stent graft candidate the patient will followup with me after her CT scan  Fabienne Bruns, MD [facility] Office: [phone] Pager: [phone]

## 2013-06-05 ENCOUNTER — Telehealth: Payer: Self-pay

## 2013-06-05 ENCOUNTER — Telehealth: Payer: Self-pay | Admitting: Vascular Surgery

## 2013-06-05 NOTE — Telephone Encounter (Addendum)
Message copied by Fredrich Birks on Fri Jun 05, 2013  3:44 PM ------      Message from: Sherren Kerns      Created: Fri Jun 05, 2013 11:34 AM      Regarding: RE: Appointment ?       Yes within the next 1-2 weeks            [name]      ----- Message -----         From: Fredrich Birks         Sent: 06/05/2013   9:20 AM           To: Sherren Kerns, MD      Subject: Appointment ?                                            Dr Darrick Penna,            Ms [facility] daughter called to ask how urgent it is to have the CTA. Right now she is scheduled for 07/09/13. Should we move that appointment up?            Thanks,            [name]       ------  Spoke with pts daughter [name] to r.s to 06/25/13- dpm

## 2013-06-05 NOTE — Telephone Encounter (Signed)
Phone call from pt.  States she had an abdominal ultrasound yesterday prior to seeing Dr. Darrick Penna.  C/o being very sore last night from her navel to the left side.  States the discomfort was 6-7/10, and this morning the discomfort is down to a 1/10.  Took Tylenol for the discomfort.   Also relates she has Irritable Bowel Syndrome, and thought that some of the discomfort may have been related to "gas."   Does admit to feeling better this morning.  Pt. Said she had this type of discomfort with a previous ultrasound, but not as uncomfortable.  Encouraged to apply heat to the abdominal area for comfort measures, and to take Tylenol or Ibuprofen for pain.  Encouraged to call office if symptoms don't  Continue to improve.

## 2013-06-11 ENCOUNTER — Encounter: Payer: Self-pay | Admitting: Cardiovascular Disease

## 2013-06-11 ENCOUNTER — Ambulatory Visit (INDEPENDENT_AMBULATORY_CARE_PROVIDER_SITE_OTHER): Payer: Medicare Other | Admitting: Cardiovascular Disease

## 2013-06-11 VITALS — BP 130/72 | HR 81 | Ht 66.5 in | Wt 167.8 lb

## 2013-06-11 DIAGNOSIS — R079 Chest pain, unspecified: Secondary | ICD-10-CM

## 2013-06-11 DIAGNOSIS — I2510 Atherosclerotic heart disease of native coronary artery without angina pectoris: Secondary | ICD-10-CM

## 2013-06-11 DIAGNOSIS — E7800 Pure hypercholesterolemia, unspecified: Secondary | ICD-10-CM

## 2013-06-11 DIAGNOSIS — I714 Abdominal aortic aneurysm, without rupture: Secondary | ICD-10-CM | POA: Diagnosis not present

## 2013-06-11 DIAGNOSIS — R0602 Shortness of breath: Secondary | ICD-10-CM | POA: Diagnosis not present

## 2013-06-11 MED ORDER — ATORVASTATIN CALCIUM 10 MG PO TABS: 10.0000 mg | ORAL_TABLET | Freq: Every day | ORAL | Status: DC

## 2013-06-11 NOTE — Assessment & Plan Note (Addendum)
CT scan scheduled for next week. This will determine whether endograft can be placed.

## 2013-06-11 NOTE — Patient Instructions (Addendum)
You are doing well. Please start atorvastatin one every other day. After a few weeks, increase to a full pill every day  Please start aspirin 81 mg coated one a day  We will schedule a lexiscan stress test at [facility] for chest pain  Please call us if you have new issues that need to be addressed before your next appt.  Your physician wants you to follow-up in: 6 months.  You will receive a reminder letter in the mail two months in advance. If you don't receive a letter, please call our office to schedule the follow-up appointment.

## 2013-06-11 NOTE — Assessment & Plan Note (Signed)
Given underlying coronary artery disease and aneurysm, discussed with her that ideally we would like better lipid management. She is willing to try alternate statin at low dose. She will try Lipitor 10 mg every other day with slow titration up to daily if tolerated.

## 2013-06-11 NOTE — Assessment & Plan Note (Addendum)
Moderate diffuse disease noted previously by cardiac catheterization in 2010. Given new symptoms of shortness of breath and chest pain, sometimes with exertion, will have her complete a nuclear Myoview. If symptoms get worse, she certainly could have three-vessel disease and cardiac catheterization could be done. In discussion today, she prefers Myoview, though catheterization was offered. Also suggested she start low-dose aspirin, coated

## 2013-06-11 NOTE — Progress Notes (Signed)
Patient ID: Ashlee Reyes, female    DOB: 1921/08/11, 79 y.o.   MRN: [id number]  HPI Comments: Ashlee Reyes is a 79 year old woman with history of coronary artery disease by cardiac catheterization in 2010, abdominal aortic aneurysm, hyperlipidemia who presents by referral from Dr. Milinda Antis and Dr. Darrick Penna for evaluation prior to abdominal aortic aneurysm repair.   she states that she has been having some chest discomfort at times. She is active many days, working in her garden. Recently he was building a rock wall for her garden. She does have to sit down frequently to rest, has shortness of breath and chest pain. Also has some chest discomfort at rest. She seems to have significant discomfort also in her abdomen following her colectomy. Her Daughter is concerned about her chest discomfort.  She was tried in the past on Crestor and states that she had leg pain and discontinued the medication. She's not a she has tried any of the other medications. Her mother had a heart attack.   She's not taking aspirin. She does not like taking pills.  She is scheduled to have abdominal CT scan on July 10 to evaluate her aneurysm.  EKG shows normal sinus rhythm with rate 81 beats per minute, no significant ST or T wave changes, rare APCs        Outpatient Encounter Prescriptions as of 06/11/2013  Medication Sig Dispense Refill  . acetaminophen (TYLENOL) 500 MG tablet Take 500-1,000 mg by mouth every 4 (four) hours as needed. For pain      . albuterol (PROAIR HFA) 108 (90 BASE) MCG/ACT inhaler Inhale 2 puffs into the lungs every 4 (four) hours as needed for wheezing. 2 puffs up to every 4 hours as needed for wheezing  1 Inhaler  11  . beclomethasone (QVAR) 40 MCG/ACT inhaler Inhale 2 puffs into the lungs 2 (two) times daily.      . cetirizine (ZYRTEC) 10 MG tablet Take 10 mg by mouth daily as needed. For allergies      . esomeprazole (NEXIUM) 40 MG capsule Take 40 mg by mouth daily before breakfast.      .  ipratropium (ATROVENT) 0.03 % nasal spray Place 2 sprays into the nose as needed.      . latanoprost (XALATAN) 0.005 % ophthalmic solution Place 1 drop into both eyes at bedtime.        Bertram Gala Glycol-Propyl Glycol (SYSTANE OP) Apply to eye as needed.      . polyethylene glycol (MIRALAX / GLYCOLAX) packet Take 17 g by mouth daily as needed. For constipation      . Probiotic Product (HEALTHY COLON) CAPS Take 1 capsule by mouth daily as needed.       . ranitidine (ZANTAC) 150 MG tablet Take 150 mg by mouth at bedtime.        Review of Systems  Constitutional: Negative.   HENT: Negative.   Eyes: Negative.   Respiratory: Negative.   Cardiovascular: Negative.   Gastrointestinal: Positive for abdominal pain.  Musculoskeletal: Negative.   Skin: Negative.   Neurological: Negative.   Psychiatric/Behavioral: Negative.     BP 130/72  Pulse 81  Ht 5' 6.5" (1.689 m)  Wt 167 lb 12 oz (76.091 kg)  BMI 26.67 kg/m2  LMP 12/17/1968  Physical Exam  Nursing note and vitals reviewed. Constitutional: She is oriented to person, place, and time. She appears well-developed and well-nourished.  HENT:  Head: Normocephalic.  Nose: Nose normal.  Mouth/Throat: Oropharynx is clear and  moist.  Eyes: Conjunctivae are normal. Pupils are equal, round, and reactive to light.  Neck: Normal range of motion. Neck supple. No JVD present.  Cardiovascular: Normal rate, regular rhythm, S1 normal, S2 normal, normal heart sounds and intact distal pulses.  Exam reveals no gallop and no friction rub.   No murmur heard. Pulmonary/Chest: Effort normal and breath sounds normal. No respiratory distress. She has no wheezes. She has no rales. She exhibits no tenderness.  Abdominal: Soft. Bowel sounds are normal. She exhibits no distension. There is no tenderness.  Musculoskeletal: Normal range of motion. She exhibits no edema and no tenderness.  Lymphadenopathy:    She has no cervical adenopathy.  Neurological: She is  alert and oriented to person, place, and time. Coordination normal.  Skin: Skin is warm and dry. No rash noted. No erythema.  Psychiatric: She has a normal mood and affect. Her behavior is normal. Judgment and thought content normal.    Assessment and Plan

## 2013-06-17 ENCOUNTER — Ambulatory Visit: Payer: Self-pay | Admitting: Cardiovascular Disease

## 2013-06-17 DIAGNOSIS — R079 Chest pain, unspecified: Secondary | ICD-10-CM

## 2013-06-22 ENCOUNTER — Other Ambulatory Visit: Payer: Self-pay | Admitting: Vascular Surgery

## 2013-06-22 DIAGNOSIS — I714 Abdominal aortic aneurysm, without rupture: Secondary | ICD-10-CM | POA: Diagnosis not present

## 2013-06-22 LAB — BUN: BUN: 17 mg/dL (ref 6–23)

## 2013-06-22 LAB — CREATININE, SERUM: Creat: 0.85 mg/dL (ref 0.50–1.10)

## 2013-06-24 ENCOUNTER — Encounter: Payer: Self-pay | Admitting: Vascular Surgery

## 2013-06-25 ENCOUNTER — Encounter (HOSPITAL_COMMUNITY): Payer: Self-pay | Admitting: Pharmacy Technician

## 2013-06-25 ENCOUNTER — Ambulatory Visit (INDEPENDENT_AMBULATORY_CARE_PROVIDER_SITE_OTHER): Payer: Medicare Other | Admitting: Vascular Surgery

## 2013-06-25 ENCOUNTER — Ambulatory Visit
Admission: RE | Admit: 2013-06-25 | Discharge: 2013-06-25 | Disposition: A | Discharge status: Home / Self Care | Payer: Medicare Other | Source: Ambulatory Visit | Attending: Vascular Surgery | Admitting: Vascular Surgery

## 2013-06-25 ENCOUNTER — Encounter: Payer: Self-pay | Admitting: Vascular Surgery

## 2013-06-25 VITALS — BP 137/65 | HR 86 | Ht 66.5 in | Wt 166.7 lb

## 2013-06-25 DIAGNOSIS — Z01818 Encounter for other preprocedural examination: Secondary | ICD-10-CM | POA: Diagnosis not present

## 2013-06-25 DIAGNOSIS — I714 Abdominal aortic aneurysm, without rupture: Secondary | ICD-10-CM

## 2013-06-25 DIAGNOSIS — Z01810 Encounter for preprocedural cardiovascular examination: Secondary | ICD-10-CM

## 2013-06-25 MED ORDER — IOHEXOL 350 MG/ML SOLN
80.0000 mL | Freq: Once | INTRAVENOUS | Status: AC | PRN
  Administered 2013-06-25: 80 mL via INTRAVENOUS

## 2013-06-25 NOTE — Progress Notes (Signed)
Patient is an 79 year old female who presents for followup today of a known abdominal aortic aneurysm. She has chronic back pain. She denies any new abdominal or back pain. The aneurysm was 4.7 cm in December of 2013. CTA from today shows the AAA is now 5.3 cm. Chronic medical problems include irritable bowel syndrome coronary artery disease hyperlipidemia and asthma all of which are currently stable.  Recent cardiac stress test was negative.  Past Medical History   Diagnosis  Date   .  Osteoarthritis      spine/spinal stenosis   .  GERD (gastroesophageal reflux disease)    .  Hemorrhoids    .  IC (irritable colon)      DMSO in past   .  Allergy      allergic rhinitis   .  IBS (irritable bowel syndrome)    .  Diverticulosis    .  CAD (coronary artery disease)  07/2009     mod by cath 08/10   .  AAA (abdominal aortic aneurysm)      followed by Dr Darrick Penna. Last u/s07/10 stable AAA with largest measurement 3.97 x 4.02cm.   .  Cancer      basal cell cancer - face.   .  Osteopenia  2011   .  Hyperlipidemia    .  Depression    .  Asthma     Past Surgical History   Procedure  Laterality  Date   .  Right colectomy   1997     benign   .  Abdominal hysterectomy       partial not cancer   .  Cystocele repair     .  Rectocele repair     .  Hernia repair   11/1997     umbilical hernia   .  Esophagogastroduodenoscopy   09/1998     erosive esphagitis ? stricture treated 12/2002   .  Refractive surgery       for film after cataract surgery.   .  Skin lesion excision   05/2011     basal cell skin lesion removed.    History    Social History   .  Marital Status:  Married     Spouse Name:  N/A     Number of Children:  N/A   .  Years of Education:  N/A    Occupational History   .  Not on file.    Social History Main Topics   .  Smoking status:  Never Smoker   .  Smokeless tobacco:  Never Used   .  Alcohol Use:  No   .  Drug Use:  No   .  Sexually Active:  Not on file    Other  Topics  Concern   .  Not on file    Social History Narrative   .  No narrative on file    Family History   Problem  Relation  Age of Onset   .  Heart disease  Mother      deceased age 31 heart attack.   Marland Kitchen  Heart attack  Mother    .  Other  Mother      varicose veins   .  Heart disease  Father      age 50 heart attack   .  Cancer  Father    .  Stroke  Brother    .  Cancer  Brother    .  Heart  disease  Brother    .  Heart attack  Brother    .  Cancer  Daughter    .  Diabetes  Son     Review of systems: Cardiac has shortness of breath with exertion, pulmonary has occasional asthma flareups, neurologic bilateral lower extremity weakness, ENT has some occasional swallowing difficulty followed by Dr[facility]Vaught at [facility] all other systems were reviewed and are negative   Physical exam:  Filed Vitals:   06/25/13 1342  BP: 137/65  Pulse: 86  Height: 5' 6.5" (1.689 m)  Weight: 166 lb 11.2 oz (75.615 kg)  SpO2: 100%   Neck: No carotid bruits  Chest: Clear to auscultation bilaterally  Cardiac: Regular rate and rhythm without murmur  Abdomen: Soft nontender easily palpable pulsatile mass  Extremities: 2+ femoral pulses bilaterally  Neuro: Symmetric upper and lower extremity motor strength which is 5 over 5   Data: Her abdominal aneurysm is now 5.6 cm on ultrasound.  AAA 5.3 cm on CTA today no iliac component, but does have right CIA stenosis   Assessment: Enlarging abdominal aortic aneurysm which warrants repair.  Plan: Gore excluder stent graft 07/01/13.  Risks benefits possible complications procedure details discussed with pt and daughter.  Fabienne Bruns, MD  [facility]  Office: [phone]  Pager: [phone]

## 2013-06-26 ENCOUNTER — Ambulatory Visit (HOSPITAL_COMMUNITY)
Admission: RE | Admit: 2013-06-26 | Discharge: 2013-06-26 | Disposition: A | Discharge status: Home / Self Care | Payer: Medicare Other | Source: Ambulatory Visit | Attending: Anesthesiology | Admitting: Anesthesiology

## 2013-06-26 ENCOUNTER — Encounter (HOSPITAL_COMMUNITY)
Admission: RE | Admit: 2013-06-26 | Discharge: 2013-06-26 | Disposition: A | Discharge status: Home / Self Care | Payer: Medicare Other | Source: Ambulatory Visit | Attending: Vascular Surgery | Admitting: Vascular Surgery

## 2013-06-26 ENCOUNTER — Encounter (HOSPITAL_COMMUNITY): Payer: Self-pay

## 2013-06-26 ENCOUNTER — Other Ambulatory Visit: Payer: Self-pay

## 2013-06-26 DIAGNOSIS — Z01818 Encounter for other preprocedural examination: Secondary | ICD-10-CM | POA: Diagnosis not present

## 2013-06-26 DIAGNOSIS — I517 Cardiomegaly: Secondary | ICD-10-CM | POA: Diagnosis not present

## 2013-06-26 DIAGNOSIS — Z01812 Encounter for preprocedural laboratory examination: Secondary | ICD-10-CM | POA: Insufficient documentation

## 2013-06-26 HISTORY — DX: Interstitial cystitis (chronic) without hematuria: N30.10

## 2013-06-26 HISTORY — DX: Other intervertebral disc degeneration, lumbosacral region without mention of lumbar back pain or lower extremity pain: M51.379

## 2013-06-26 HISTORY — DX: Other intervertebral disc degeneration, lumbosacral region: M51.37

## 2013-06-26 HISTORY — DX: Personal history of urinary (tract) infections: Z87.440

## 2013-06-26 LAB — PROTIME-INR
INR: 1 (ref 0.00–1.49)
Prothrombin Time: 13 seconds (ref 11.6–15.2)

## 2013-06-26 LAB — BLOOD GAS, ARTERIAL
Bicarbonate: 24.2 mEq/L — ABNORMAL HIGH (ref 20.0–24.0)
O2 Saturation: 98.8 %
Patient temperature: 98.6
TCO2: 25.3 mmol/L (ref 0–100)
pH, Arterial: 7.447 (ref 7.350–7.450)

## 2013-06-26 LAB — URINALYSIS, ROUTINE W REFLEX MICROSCOPIC
Nitrite: NEGATIVE
Protein, ur: NEGATIVE mg/dL
Specific Gravity, Urine: 1.013 (ref 1.005–1.030)
Urobilinogen, UA: 0.2 mg/dL (ref 0.0–1.0)

## 2013-06-26 LAB — URINE MICROSCOPIC-ADD ON

## 2013-06-26 LAB — COMPREHENSIVE METABOLIC PANEL
Albumin: 3.4 g/dL — ABNORMAL LOW (ref 3.5–5.2)
BUN: 20 mg/dL (ref 6–23)
Calcium: 9.3 mg/dL (ref 8.4–10.5)
Creatinine, Ser: 0.81 mg/dL (ref 0.50–1.10)
GFR calc Af Amer: 72 mL/min — ABNORMAL LOW (ref >90)
Glucose, Bld: 126 mg/dL — ABNORMAL HIGH (ref 70–99)
Potassium: 4.2 mEq/L (ref 3.5–5.1)
Total Protein: 6.7 g/dL (ref 6.0–8.3)

## 2013-06-26 LAB — CBC
HCT: 38.3 % (ref 36.0–46.0)
Hemoglobin: 13 g/dL (ref 12.0–15.0)
MCH: 30 pg (ref 26.0–34.0)
MCHC: 33.9 g/dL (ref 30.0–36.0)
MCV: 88.5 fL (ref 78.0–100.0)
RDW: 14.5 % (ref 11.5–15.5)

## 2013-06-26 NOTE — Pre-Procedure Instructions (Signed)
Ashlee Reyes  06/26/2013   Your procedure is scheduled on:  Wednesday, July 16th  [facility] at 0630 AM. Come to main entrance A and check in at desk  Call this number if you have problems the morning of surgery: [phone]   Remember:   Do not eat food or drink liquids after midnight.   Take these medicines the morning of surgery with A SIP OF WATER: Nexium, tylenol if needed, albuterol if needed, qvar   Do not wear jewelry, make-up or nail polish.  Do not wear lotions, powders, or perfume,deodorant.  Do not shave 48 hours prior to surgery. Men may shave face and neck.  Do not bring valuables to the hospital.  [facility] is not responsible for any belongings or valuables.  Contacts, dentures or bridgework may not be worn into surgery.  Leave suitcase in the car. After surgery it may be brought to your room.  For patients admitted to the hospital, checkout time is 11:00 AM the day of discharge.   Special Instructions: Shower using CHG 2 nights before surgery and the night before surgery.  If you shower the day of surgery use CHG.  Use special wash - you have one bottle of CHG for all showers.  You should use approximately 1/3 of the bottle for each shower.   Please read over the following fact sheets that you were given: Pain Booklet, Coughing and Deep Breathing, Blood Transfusion Information, MRSA Information and Surgical Site Infection Prevention

## 2013-06-26 NOTE — Progress Notes (Signed)
Notified carol at dr. Darrick Penna office regarding need for surgical orders.

## 2013-06-28 LAB — URINE CULTURE: Colony Count: >=100000

## 2013-06-29 ENCOUNTER — Encounter: Payer: Self-pay | Admitting: Vascular Surgery

## 2013-06-30 ENCOUNTER — Encounter (HOSPITAL_COMMUNITY): Payer: Self-pay | Admitting: Certified Registered Nurse Anesthetist

## 2013-06-30 MED ORDER — VANCOMYCIN HCL IN DEXTROSE 1-5 GM/200ML-% IV SOLN
1000.0000 mg | INTRAVENOUS | Status: AC
  Administered 2013-07-01: 1000 mg via INTRAVENOUS
  Filled 2013-06-30 (×2): qty 200

## 2013-07-01 ENCOUNTER — Inpatient Hospital Stay (HOSPITAL_COMMUNITY): Payer: Medicare Other | Admitting: Certified Registered Nurse Anesthetist

## 2013-07-01 ENCOUNTER — Telehealth: Payer: Self-pay | Admitting: Vascular Surgery

## 2013-07-01 ENCOUNTER — Encounter (HOSPITAL_COMMUNITY): Payer: Self-pay

## 2013-07-01 ENCOUNTER — Other Ambulatory Visit: Payer: Self-pay | Admitting: *Deleted

## 2013-07-01 ENCOUNTER — Inpatient Hospital Stay (HOSPITAL_COMMUNITY)
Admission: RE | Admit: 2013-07-01 | Discharge: 2013-07-02 | DRG: 238 | Disposition: A | Discharge status: Home / Self Care | Payer: Medicare Other | Source: Ambulatory Visit | Attending: Vascular Surgery | Admitting: Vascular Surgery

## 2013-07-01 ENCOUNTER — Inpatient Hospital Stay (HOSPITAL_COMMUNITY): Payer: Medicare Other

## 2013-07-01 ENCOUNTER — Encounter (HOSPITAL_COMMUNITY): Payer: Self-pay | Admitting: Certified Registered Nurse Anesthetist

## 2013-07-01 ENCOUNTER — Encounter (HOSPITAL_COMMUNITY)
Admission: RE | Disposition: A | Discharge status: Home / Self Care | Payer: Self-pay | Source: Ambulatory Visit | Attending: Vascular Surgery

## 2013-07-01 DIAGNOSIS — I714 Abdominal aortic aneurysm, without rupture, unspecified: Secondary | ICD-10-CM | POA: Diagnosis not present

## 2013-07-01 DIAGNOSIS — I712 Thoracic aortic aneurysm, without rupture: Secondary | ICD-10-CM | POA: Diagnosis not present

## 2013-07-01 DIAGNOSIS — I2510 Atherosclerotic heart disease of native coronary artery without angina pectoris: Secondary | ICD-10-CM | POA: Diagnosis not present

## 2013-07-01 DIAGNOSIS — F3289 Other specified depressive episodes: Secondary | ICD-10-CM | POA: Diagnosis present

## 2013-07-01 DIAGNOSIS — E785 Hyperlipidemia, unspecified: Secondary | ICD-10-CM | POA: Diagnosis not present

## 2013-07-01 DIAGNOSIS — M1990 Unspecified osteoarthritis, unspecified site: Secondary | ICD-10-CM | POA: Diagnosis not present

## 2013-07-01 DIAGNOSIS — F329 Major depressive disorder, single episode, unspecified: Secondary | ICD-10-CM | POA: Diagnosis present

## 2013-07-01 DIAGNOSIS — K589 Irritable bowel syndrome without diarrhea: Secondary | ICD-10-CM | POA: Diagnosis present

## 2013-07-01 DIAGNOSIS — K219 Gastro-esophageal reflux disease without esophagitis: Secondary | ICD-10-CM | POA: Diagnosis not present

## 2013-07-01 DIAGNOSIS — Z48812 Encounter for surgical aftercare following surgery on the circulatory system: Secondary | ICD-10-CM

## 2013-07-01 HISTORY — PX: ABDOMINAL AORTIC ENDOVASCULAR STENT GRAFT: SHX5707

## 2013-07-01 LAB — BASIC METABOLIC PANEL
BUN: 15 mg/dL (ref 6–23)
CO2: 23 mEq/L (ref 19–32)
Chloride: 110 mEq/L (ref 96–112)
GFR calc non Af Amer: 73 mL/min — ABNORMAL LOW (ref >90)
Glucose, Bld: 105 mg/dL — ABNORMAL HIGH (ref 70–99)
Potassium: 3.9 mEq/L (ref 3.5–5.1)
Sodium: 142 mEq/L (ref 135–145)

## 2013-07-01 LAB — PROTIME-INR: INR: 1.15 (ref 0.00–1.49)

## 2013-07-01 LAB — CBC
HCT: 36.1 % (ref 36.0–46.0)
Hemoglobin: 12.3 g/dL (ref 12.0–15.0)
RBC: 4.11 MIL/uL (ref 3.87–5.11)
WBC: 7.9 10*3/uL (ref 4.0–10.5)

## 2013-07-01 LAB — APTT: aPTT: 32 seconds (ref 24–37)

## 2013-07-01 LAB — MAGNESIUM: Magnesium: 2 mg/dL (ref 1.5–2.5)

## 2013-07-01 LAB — ABO/RH: ABO/RH(D): O POS

## 2013-07-01 LAB — TYPE AND SCREEN
ABO/RH(D): O POS
Antibody Screen: NEGATIVE

## 2013-07-01 SURGERY — INSERTION, ENDOVASCULAR STENT GRAFT, AORTA, ABDOMINAL
Anesthesia: General | Wound class: Clean

## 2013-07-01 MED ORDER — OXYCODONE HCL 5 MG PO TABS
5.0000 mg | ORAL_TABLET | ORAL | Status: DC | PRN
  Administered 2013-07-01: 10 mg via ORAL

## 2013-07-01 MED ORDER — FENTANYL CITRATE 0.05 MG/ML IJ SOLN: 25.0000 ug | INTRAMUSCULAR | Status: DC | PRN

## 2013-07-01 MED ORDER — LACTATED RINGERS IV SOLN
INTRAVENOUS | Status: DC | PRN
  Administered 2013-07-01: 08:00:00 via INTRAVENOUS

## 2013-07-01 MED ORDER — VANCOMYCIN HCL IN DEXTROSE 750-5 MG/150ML-% IV SOLN
750.0000 mg | Freq: Two times a day (BID) | INTRAVENOUS | Status: AC
  Administered 2013-07-01 – 2013-07-02 (×2): 750 mg via INTRAVENOUS
  Filled 2013-07-01 (×2): qty 150

## 2013-07-01 MED ORDER — SODIUM CHLORIDE 0.9 % IV SOLN
10.0000 mg | INTRAVENOUS | Status: DC | PRN
  Administered 2013-07-01: 40 ug/min via INTRAVENOUS

## 2013-07-01 MED ORDER — VANCOMYCIN HCL IN DEXTROSE 1-5 GM/200ML-% IV SOLN
1000.0000 mg | Freq: Two times a day (BID) | INTRAVENOUS | Status: DC
  Filled 2013-07-01 (×2): qty 200

## 2013-07-01 MED ORDER — SODIUM CHLORIDE 0.9 % IV SOLN
INTRAVENOUS | Status: DC
  Administered 2013-07-01: 75 mL/h via INTRAVENOUS

## 2013-07-01 MED ORDER — MORPHINE SULFATE 2 MG/ML IJ SOLN
INTRAMUSCULAR | Status: AC
  Administered 2013-07-01: 2 mg via INTRAVENOUS
  Filled 2013-07-01: qty 1

## 2013-07-01 MED ORDER — ALBUTEROL SULFATE HFA 108 (90 BASE) MCG/ACT IN AERS
2.0000 | INHALATION_SPRAY | RESPIRATORY_TRACT | Status: DC | PRN
  Filled 2013-07-01: qty 6.7

## 2013-07-01 MED ORDER — HYDRALAZINE HCL 20 MG/ML IJ SOLN: 10.0000 mg | INTRAMUSCULAR | Status: DC | PRN

## 2013-07-01 MED ORDER — PHENOL 1.4 % MT LIQD: 1.0000 | OROMUCOSAL | Status: DC | PRN

## 2013-07-01 MED ORDER — LIDOCAINE HCL (CARDIAC) 20 MG/ML IV SOLN
INTRAVENOUS | Status: DC | PRN
  Administered 2013-07-01: 70 mg via INTRAVENOUS

## 2013-07-01 MED ORDER — MORPHINE SULFATE 2 MG/ML IJ SOLN: 2.0000 mg | INTRAMUSCULAR | Status: DC | PRN

## 2013-07-01 MED ORDER — FENTANYL CITRATE 0.05 MG/ML IJ SOLN
INTRAMUSCULAR | Status: DC | PRN
  Administered 2013-07-01 (×2): 50 ug via INTRAVENOUS

## 2013-07-01 MED ORDER — LORATADINE 10 MG PO TABS
10.0000 mg | ORAL_TABLET | Freq: Every day | ORAL | Status: DC | PRN
  Filled 2013-07-01: qty 1

## 2013-07-01 MED ORDER — HEPARIN SODIUM (PORCINE) 1000 UNIT/ML IJ SOLN
INTRAMUSCULAR | Status: DC | PRN
  Administered 2013-07-01: 8 mL via INTRAVENOUS

## 2013-07-01 MED ORDER — LABETALOL HCL 5 MG/ML IV SOLN: 10.0000 mg | INTRAVENOUS | Status: DC | PRN

## 2013-07-01 MED ORDER — IODIXANOL 320 MG/ML IV SOLN
INTRAVENOUS | Status: DC | PRN
  Administered 2013-07-01: 50 mL via INTRA_ARTERIAL
  Administered 2013-07-01: 150 mL via INTRA_ARTERIAL

## 2013-07-01 MED ORDER — ONDANSETRON HCL 4 MG/2ML IJ SOLN: 4.0000 mg | Freq: Once | INTRAMUSCULAR | Status: DC | PRN

## 2013-07-01 MED ORDER — ASPIRIN 81 MG PO CHEW
81.0000 mg | CHEWABLE_TABLET | Freq: Every day | ORAL | Status: DC
  Administered 2013-07-02: 81 mg via ORAL
  Filled 2013-07-01: qty 1

## 2013-07-01 MED ORDER — ROCURONIUM BROMIDE 100 MG/10ML IV SOLN
INTRAVENOUS | Status: DC | PRN
  Administered 2013-07-01: 50 mg via INTRAVENOUS

## 2013-07-01 MED ORDER — ASPIRIN 81 MG PO TABS: 81.0000 mg | ORAL_TABLET | Freq: Every day | ORAL | Status: DC

## 2013-07-01 MED ORDER — POTASSIUM CHLORIDE CRYS ER 20 MEQ PO TBCR: 20.0000 meq | EXTENDED_RELEASE_TABLET | Freq: Once | ORAL | Status: AC | PRN

## 2013-07-01 MED ORDER — DOCUSATE SODIUM 100 MG PO CAPS
100.0000 mg | ORAL_CAPSULE | Freq: Every day | ORAL | Status: DC
  Administered 2013-07-02: 100 mg via ORAL
  Filled 2013-07-01: qty 1

## 2013-07-01 MED ORDER — SODIUM CHLORIDE 0.9 % IV SOLN: INTRAVENOUS | Status: DC

## 2013-07-01 MED ORDER — FLUTICASONE PROPIONATE HFA 44 MCG/ACT IN AERO
1.0000 | INHALATION_SPRAY | Freq: Two times a day (BID) | RESPIRATORY_TRACT | Status: DC
  Administered 2013-07-02: 1 via RESPIRATORY_TRACT
  Filled 2013-07-01 (×2): qty 10.6

## 2013-07-01 MED ORDER — LATANOPROST 0.005 % OP SOLN
1.0000 [drp] | Freq: Every day | OPHTHALMIC | Status: DC
  Administered 2013-07-01: 1 [drp] via OPHTHALMIC
  Filled 2013-07-01: qty 2.5

## 2013-07-01 MED ORDER — ACETAMINOPHEN 325 MG PO TABS
325.0000 mg | ORAL_TABLET | ORAL | Status: DC | PRN
  Administered 2013-07-02: 650 mg via ORAL
  Filled 2013-07-01: qty 2

## 2013-07-01 MED ORDER — SODIUM CHLORIDE 0.9 % IV SOLN: 500.0000 mL | Freq: Once | INTRAVENOUS | Status: AC | PRN

## 2013-07-01 MED ORDER — POLYETHYLENE GLYCOL 3350 17 G PO PACK
17.0000 g | PACK | Freq: Every day | ORAL | Status: DC | PRN
  Filled 2013-07-01: qty 1

## 2013-07-01 MED ORDER — NEOSTIGMINE METHYLSULFATE 1 MG/ML IJ SOLN
INTRAMUSCULAR | Status: DC | PRN
  Administered 2013-07-01: 3 mg via INTRAVENOUS

## 2013-07-01 MED ORDER — ALUM & MAG HYDROXIDE-SIMETH 200-200-20 MG/5ML PO SUSP: 15.0000 mL | ORAL | Status: DC | PRN

## 2013-07-01 MED ORDER — PROTAMINE SULFATE 10 MG/ML IV SOLN
INTRAVENOUS | Status: DC | PRN
  Administered 2013-07-01 (×2): 20 mg via INTRAVENOUS
  Administered 2013-07-01 (×4): 10 mg via INTRAVENOUS

## 2013-07-01 MED ORDER — ACETAMINOPHEN 650 MG RE SUPP: 325.0000 mg | RECTAL | Status: DC | PRN

## 2013-07-01 MED ORDER — ENOXAPARIN SODIUM 30 MG/0.3ML ~~LOC~~ SOLN
30.0000 mg | SUBCUTANEOUS | Status: DC
  Filled 2013-07-01: qty 0.3

## 2013-07-01 MED ORDER — GLYCOPYRROLATE 0.2 MG/ML IJ SOLN
INTRAMUSCULAR | Status: DC | PRN
  Administered 2013-07-01: 0.4 mg via INTRAVENOUS

## 2013-07-01 MED ORDER — METOPROLOL TARTRATE 1 MG/ML IV SOLN: 2.0000 mg | INTRAVENOUS | Status: DC | PRN

## 2013-07-01 MED ORDER — OXYCODONE HCL 5 MG PO TABS
ORAL_TABLET | ORAL | Status: AC
  Filled 2013-07-01: qty 2

## 2013-07-01 MED ORDER — 0.9 % SODIUM CHLORIDE (POUR BTL) OPTIME
TOPICAL | Status: DC | PRN
  Administered 2013-07-01: 1000 mL

## 2013-07-01 MED ORDER — ATORVASTATIN CALCIUM 10 MG PO TABS
10.0000 mg | ORAL_TABLET | Freq: Every day | ORAL | Status: DC
  Administered 2013-07-01: 10 mg via ORAL
  Filled 2013-07-01 (×2): qty 1

## 2013-07-01 MED ORDER — ONDANSETRON HCL 4 MG/2ML IJ SOLN: 4.0000 mg | Freq: Four times a day (QID) | INTRAMUSCULAR | Status: DC | PRN

## 2013-07-01 MED ORDER — OXYCODONE HCL 5 MG PO TABS: 5.0000 mg | ORAL_TABLET | Freq: Four times a day (QID) | ORAL | Status: DC | PRN

## 2013-07-01 MED ORDER — PROPOFOL 10 MG/ML IV BOLUS
INTRAVENOUS | Status: DC | PRN
  Administered 2013-07-01: 110 mg via INTRAVENOUS

## 2013-07-01 MED ORDER — IPRATROPIUM BROMIDE 0.03 % NA SOLN
2.0000 | NASAL | Status: DC | PRN
  Filled 2013-07-01: qty 30

## 2013-07-01 MED ORDER — SODIUM CHLORIDE 0.9 % IR SOLN
Status: DC | PRN
  Administered 2013-07-01: 10:00:00

## 2013-07-01 MED ORDER — ONDANSETRON HCL 4 MG/2ML IJ SOLN
INTRAMUSCULAR | Status: DC | PRN
  Administered 2013-07-01: 4 mg via INTRAVENOUS

## 2013-07-01 MED ORDER — DOPAMINE-DEXTROSE 3.2-5 MG/ML-% IV SOLN: 3.0000 ug/kg/min | INTRAVENOUS | Status: DC

## 2013-07-01 SURGICAL SUPPLY — 81 items
ADH SKN CLS APL DERMABOND .7 (GAUZE/BANDAGES/DRESSINGS) ×1
BAG BANDED W/RUBBER/TAPE 36X54 (MISCELLANEOUS) ×2 IMPLANT
BAG EQP BAND 135X91 W/RBR TAPE (MISCELLANEOUS) ×1
BAG SNAP BAND KOVER 36X36 (MISCELLANEOUS) ×2 IMPLANT
BLADE SURG ROTATE 9660 (MISCELLANEOUS) ×1 IMPLANT
CANISTER SUCTION 2500CC (MISCELLANEOUS) ×2 IMPLANT
CATH BEACON 5.038 65CM KMP-01 (CATHETERS) ×1 IMPLANT
CATH HEADHUNTER 5FR 65CM (MISCELLANEOUS) ×1 IMPLANT
CATH OMNI FLUSH .035X70CM (CATHETERS) ×1 IMPLANT
CLIP TI MEDIUM 24 (CLIP) ×1 IMPLANT
CLIP TI WIDE RED SMALL 24 (CLIP) ×1 IMPLANT
CLOTH BEACON ORANGE TIMEOUT ST (SAFETY) ×2 IMPLANT
COVER DOME SNAP 22 D (MISCELLANEOUS) ×2 IMPLANT
COVER MAYO STAND STRL (DRAPES) ×2 IMPLANT
COVER PROBE W GEL 5X96 (DRAPES) ×2 IMPLANT
COVER SURGICAL LIGHT HANDLE (MISCELLANEOUS) ×2 IMPLANT
DERMABOND ADVANCED (GAUZE/BANDAGES/DRESSINGS) ×1
DERMABOND ADVANCED .7 DNX12 (GAUZE/BANDAGES/DRESSINGS) ×1 IMPLANT
DEVICE CLOSURE PERCLS PRGLD 6F (VASCULAR PRODUCTS) IMPLANT
DEVICE TORQUE H2O (MISCELLANEOUS) ×1 IMPLANT
DRAPE TABLE COVER HEAVY DUTY (DRAPES) ×2 IMPLANT
DRESSING OPSITE X SMALL 2X3 (GAUZE/BANDAGES/DRESSINGS) ×4 IMPLANT
DRYSEAL FLEXSHEATH 12FR 33CM (SHEATH) ×1
DRYSEAL FLEXSHEATH 18FR 33CM (SHEATH) ×1
ELECT CAUTERY BLADE 6.4 (BLADE) ×2 IMPLANT
ELECT REM PT RETURN 9FT ADLT (ELECTROSURGICAL) ×4
ELECTRODE REM PT RTRN 9FT ADLT (ELECTROSURGICAL) ×2 IMPLANT
EXCLUDER TRUNK (Endovascular Graft) ×1 IMPLANT
GAUZE SPONGE 2X2 8PLY STRL LF (GAUZE/BANDAGES/DRESSINGS) IMPLANT
GLOVE BIO SURGEON STRL SZ 6.5 (GLOVE) ×3 IMPLANT
GLOVE BIO SURGEON STRL SZ7.5 (GLOVE) ×2 IMPLANT
GLOVE BIOGEL PI IND STRL 6.5 (GLOVE) IMPLANT
GLOVE BIOGEL PI IND STRL 7.5 (GLOVE) IMPLANT
GLOVE BIOGEL PI INDICATOR 6.5 (GLOVE) ×2
GLOVE BIOGEL PI INDICATOR 7.5 (GLOVE) ×1
GLOVE ECLIPSE 6.5 STRL STRAW (GLOVE) ×1 IMPLANT
GLOVE SURG SS PI 7.0 STRL IVOR (GLOVE) ×1 IMPLANT
GOWN PREVENTION PLUS XLARGE (GOWN DISPOSABLE) ×2 IMPLANT
GOWN STRL NON-REIN LRG LVL3 (GOWN DISPOSABLE) ×6 IMPLANT
GOWN STRL REIN XL XLG (GOWN DISPOSABLE) ×1 IMPLANT
GRAFT BALLN CATH 65CM (STENTS) IMPLANT
GRAFT EXCLUDER LEG 12X10 (Endovascular Graft) ×1 IMPLANT
GUIDEWIRE AMPLATZ STIFF 0.35 (WIRE) ×2 IMPLANT
GUIDEWIRE ANGLED .035X150CM (WIRE) ×1 IMPLANT
KIT BASIN OR (CUSTOM PROCEDURE TRAY) ×2 IMPLANT
KIT ROOM TURNOVER OR (KITS) ×2 IMPLANT
NDL PERC 18GX7CM (NEEDLE) ×1 IMPLANT
NEEDLE PERC 18GX7CM (NEEDLE) ×2 IMPLANT
NS IRRIG 1000ML POUR BTL (IV SOLUTION) ×2 IMPLANT
PACK AORTA (CUSTOM PROCEDURE TRAY) ×2 IMPLANT
PAD ARMBOARD 7.5X6 YLW CONV (MISCELLANEOUS) ×4 IMPLANT
PENCIL BUTTON HOLSTER BLD 10FT (ELECTRODE) IMPLANT
PERCLOSE PROGLIDE 6F (VASCULAR PRODUCTS) ×10
PROTECTION STATION PRESSURIZED (MISCELLANEOUS) ×2
SHEATH AVANTI 11CM 8FR (MISCELLANEOUS) ×1 IMPLANT
SHEATH BRITE TIP 8FR 23CM (MISCELLANEOUS) ×1 IMPLANT
SHEATH DRYSEAL FLEX 12FR 33CM (SHEATH) IMPLANT
SHEATH DRYSEAL FLEX 18FR 33CM (SHEATH) IMPLANT
SPONGE GAUZE 2X2 STER 10/PKG (GAUZE/BANDAGES/DRESSINGS) ×1
SPONGE SURGIFOAM ABS GEL 100 (HEMOSTASIS) IMPLANT
STAPLER VISISTAT 35W (STAPLE) IMPLANT
STATION PROTECTION PRESSURIZED (MISCELLANEOUS) IMPLANT
STENT GRAFT BALLN CATH 65CM (STENTS) ×1
STOPCOCK MORSE 400PSI 3WAY (MISCELLANEOUS) ×2 IMPLANT
SUT PROLENE 5 0 C 1 24 (SUTURE) IMPLANT
SUT VIC AB 2-0 CT1 27 (SUTURE)
SUT VIC AB 2-0 CT1 TAPERPNT 27 (SUTURE) IMPLANT
SUT VIC AB 3-0 SH 27 (SUTURE)
SUT VIC AB 3-0 SH 27X BRD (SUTURE) IMPLANT
SUT VICRYL 4-0 PS2 18IN ABS (SUTURE) ×4 IMPLANT
SYR 20CC LL (SYRINGE) ×5 IMPLANT
SYR 30ML LL (SYRINGE) IMPLANT
SYR 5ML LL (SYRINGE) ×2 IMPLANT
SYR MEDRAD MARK V 150ML (SYRINGE) ×2 IMPLANT
SYRINGE 10CC LL (SYRINGE) ×6 IMPLANT
TOWEL OR 17X24 6PK STRL BLUE (TOWEL DISPOSABLE) ×4 IMPLANT
TOWEL OR 17X26 10 PK STRL BLUE (TOWEL DISPOSABLE) ×4 IMPLANT
TRAY FOLEY CATH 14FRSI W/METER (CATHETERS) ×2 IMPLANT
TUBING HIGH PRESSURE 120CM (CONNECTOR) ×2 IMPLANT
WIRE BENTSON .035X145CM (WIRE) ×2 IMPLANT
WIRE SPARTACORE .014X190CM (WIRE) ×1 IMPLANT

## 2013-07-01 NOTE — Progress Notes (Signed)
ANTIBIOTIC CONSULT NOTE - FOLLOW UP  Pharmacy Consult for Vancomycin Indication: s/p vascular surgery  Allergies  Allergen Reactions  . Amoxicillin-Pot Clavulanate     REACTION: sick  . Avelox (Moxifloxacin Hcl In Nacl) Nausea Only  . Ibuprofen     REACTION: GI  . Keflex (Cephalexin)     abd pain / GI upset  . Omeprazole Nausea Only  . Rosuvastatin     REACTION: foot pain  . Sertraline Hcl     REACTION: more depressed  . Tetanus Toxoids     Local redness and swelling     Patient Measurements: Height: 5\' 6"  (167.6 cm) Weight: 171 lb 11.8 oz (77.9 kg) IBW/kg (Calculated) : 59.3  Vital Signs: Temp: 98.8 F (37.1 C) (07/16 1740) Temp src: Oral (07/16 1740) BP: 130/52 mmHg (07/16 1740) Intake/Output from previous day:   Intake/Output from this shift:    Labs:  Recent Labs  07/01/13 1131  WBC 7.9  HGB 12.3  PLT 170  CREATININE 0.74   Estimated Creatinine Clearance: 50.2 ml/min (by C-G formula based on Cr of 0.74). No results found for this basename: VANCOTROUGH, Leodis Binet, VANCORANDOM, GENTTROUGH, GENTPEAK, GENTRANDOM, TOBRATROUGH, TOBRAPEAK, TOBRARND, AMIKACINPEAK, AMIKACINTROU, AMIKACIN,  in the last 72 hours   Microbiology: Recent Results (from the past 720 hour(s))  SURGICAL PCR SCREEN     Status: None   Collection Time    06/26/13  2:57 PM      Result Value Range Status   MRSA, PCR NEGATIVE  NEGATIVE Final   Staphylococcus aureus NEGATIVE  NEGATIVE Final   Comment:            The Xpert SA Assay (FDA     approved for NASAL specimens     in patients over 49 years of age),     is one component of     a comprehensive surveillance     program.  Test performance has     been validated by [employer] for patients greater     than or equal to 82 year old.     It is not intended     to diagnose infection nor to     guide or monitor treatment.  URINE CULTURE     Status: None   Collection Time    06/26/13  3:56 PM      Result Value Range Status   Specimen Description URINE, CLEAN CATCH   Final   Special Requests NONE   Final   Culture  Setup Time 06/26/2013 23:53   Final   Colony Count >=100,000 COLONIES/ML   Final   Culture KLEBSIELLA PNEUMONIAE   Final   Report Status 06/28/2013 FINAL   Final   Organism ID, Bacteria KLEBSIELLA PNEUMONIAE   Final    Anti-infectives   Start     Dose/Rate Route Frequency Ordered Stop   07/01/13 2000  vancomycin (VANCOCIN) IVPB 750 mg/150 ml premix     750 mg 150 mL/hr over 60 Minutes Intravenous Every 12 hours 07/01/13 1906 07/02/13 1959   07/01/13 1900  vancomycin (VANCOCIN) IVPB 1000 mg/200 mL premix  Status:  Discontinued     1,000 mg 200 mL/hr over 60 Minutes Intravenous Every 12 hours 07/01/13 1816 07/01/13 1906   06/30/13 1445  vancomycin (VANCOCIN) IVPB 1000 mg/200 mL premix     1,000 mg 200 mL/hr over 60 Minutes Intravenous 60 min pre-op 06/30/13 1445 07/01/13 0825      Assessment: 79 year old female s/p  AAA repair to receive Vancomycin x 24 hours.  Goal of Therapy:  Vancomycin trough level 10-15 mcg/ml  Plan:  Change Vancomycin to 750mg  IV q12h x 2 doses As no dosage adjustments are anticipated pharmacy will sign off.  Thank you for the consult.   Estella Husk, Pharm.D., BCPS, AAHIVP Clinical Pharmacist Phone: [phone] or [phone] 07/01/2013, 7:08 PM

## 2013-07-01 NOTE — Preoperative (Signed)
Beta Blockers   Reason not to administer Beta Blockers:Not Applicable 

## 2013-07-01 NOTE — Op Note (Signed)
Operative findings:   #1 Bilateral Proglide closure   #2 28x12x14 cm main body Gore Excluder device delivered via a right femoral system   #3 12 x 10 cm left iliac limb contralateral   PROCEDURE DETAIL: After obtaining informed consent the patient was taken to the operating room. The patient was placed in supine position the operating room table. After induction of general anesthesia and endotracheal intubation a Foley catheter was placed. Next the patient was prepped and draped in usual sterile fashion from the nipples down to the knees. Ultrasound was used to identify the right common femoral artery as well as the femoral bifurcation. An 11 blade was used to make a small neck in the skin over the level of the right common femoral artery. An introducer needle was then used to cannulate the right common femoral artery without difficulty. A 0.035 Bentson wire was then threaded up into the abdominal aorta through the right femoral system. A short 9 French dilator was placed over the guidewire the right femoral system. This was used to dilate the tract. The dilator was then removed and a Proglide device inserted over the guidewire into the right femoral system and this was deployed at the 2:00 position. The Proglide device was then removed and an additional Proglide device was brought in operative field and deployed at the 10:00 position. The sutures were kept separate and tagged with suture tags. Next the short 9 French sheath was then placed back over the guidewire into the right femoral system and the dilator removed and the sheath thoroughly flushed with heparinized saline. Attention was then turned to the left groin. Again using ultrasound the left common femoral artery was identified. The femoral bifurcation was also identified. A small nick was made in the skin with the 11 blade. A hemostat was used to dilate a tract down to the artery. An introducer needle was then used to cannulate the left common  femoral artery without difficulty. A 0.035 Bentson wire was then threaded up into the abdominal aorta under fluoroscopic guidance. A 9 French dilator was then placed over the wire to dilate the tract. Two Proglide devices were again brought on operative field and these were fired sequentially in the 10:00 position followed by an additional Proglide at the 2:00 position. The Proglide delivery systems were removed and the long 9 French sheath placed back over the guidewire up to the level of the iliac of the aortic bifurcation.  At this point, a 5 [place] Omni flush catheter was placed over the guidewire and the left groin up and the abdominal aorta. An abdominal aortogram was obtained in the AP position to determine level of the left and right renal arteries. At this point a 28 x 12 x 14 Gore Excluder main body device was selected. The Bentson wire from the right groin was advanced up into the descending thoracic aorta over a Kumpe catheter and the Bentsen wire replaced with an 035 Amplatz wire. An 18 Fr sheath was placed over the wire.  The main body device was then placed over the Amplatz wire in the right groin and advanced up to the level of the renal arteries. Magnified views of the renal arteries were performed to make sure that we were not covering these. The top portion of the stent graft was then deployed with the end of the stent just below the level of the left renal artery and this came over to just below the level of the right renal artery.  The main body was delivered all the way down to the contralateral gate. Attention was then turned to the left groin.  The contralateral gate was somewhat constrained by the long aortic neck.   The Omni flush catheter was pulled down over a guidewire and removed and the Bentson wire placed in position to cannulate the contralateral gate. The Kumpe catheter was placed back over the guidewire in the left femoral system. A 5 [place] Kumpe catheter was placed over this and  this was used to attempt to selectively catheterize the contralateral gate.  This was done with an 035 glidewire which was unsuccessful.  We also tried an 014 wire followed by changing to an H1 catheter all unsuccessful.  We then reconstrained the C3 device and pulled it down into the aneurysm sac which allowed the limb to fully open.  I then easily cannulated the gate and the guidewire was then advanced into the descending thoracic aorta. The main body portion of the gate was confirmed by twirling the pigtail catheter. The pigtail  catheter was then placed in a location so that we could use its markers to determine the exact length to the iliac bifurcation. An Amplatz wire was placed back through the pigtail catheter. A retrograde contrast angiogram was performed to determine the level of the left internal iliac artery and a 12 x 10 cm iliac limb was selected. The pigtail catheter was removed over the guidewire and the 12 x 10 cm limb advanced so there was coverage of the long marker on the contralateral limb. This was then deployed in the usual fashion down to the iliac bifurcation. The delivery system was then removed. The remainder of the ipsilateral iliac limb was also deployed.  A retrograde contrast angiogram was also performed to make sure that the right iliac limb did not cover the right internal iliac artery.   Attention was then turned back to the left iliac system and a Gore aortic balloon placed over the wire up to the level of the proximal end of the stent and this was ballooned to profile. The limb attachment site was also ballooned as well as the distal attachment site. Attention was then turned to the right groin and the balloon was advanced over the guidewire on the right side and the distal attachment site as well as the midportion of iliac limb were also ballooned. The 5 [place] Omni flush catheter was then placed back to the guidewire on the right side and a completion arteriogram was obtained.  This showed no evidence of proximal or distal type I endoleak. There was a late type II endoleak from lumbars.    At this point the Omni flush catheter was removed over a guidewire. All delivery devices were removed. We then proceeded to remove the large 18 French sheath from the right side with the guidewire in place. With pressure held above and below the insertion site the lateral and medial Proglide closure was secured down.  There was still some oozing. An additional AP Proglide was then placed over the guidewire and deployed.  There was good hemostasis. The guidewire was removed from the right side. Attention was then turned to the left groin. In similar fashion the 12 French sheath was removed and the guidewire left in place. The medial proglide was tightened. The lateral Proglide was secured to obtain hemostasis.  There was good hemostasis and the Bentsen wire was removed from the left groin. The patient had been given 8000 units of heparin before  introducing the main body device. This was fully reversed with 80 mg of protamine at the end of the case. Each groin puncture site was then closed with a running 4-0 Vicryl subcuticular stitch.  The patient tolerated the procedure well and there were no complications. Instrument sponge and needle counts were correct at the end of the case. Patient was awakened in the operating room extubated and taken to the recovery room in stable condition.  Fabienne Bruns, MD [facility] Office: [phone] Pager: [phone]

## 2013-07-01 NOTE — Interval H&P Note (Signed)
History and Physical Interval Note:  07/01/2013 8:26 AM  Ashlee Reyes  has presented today for surgery, with the diagnosis of Abdominal Aortic Aneurysm  The various methods of treatment have been discussed with the patient and family. After consideration of risks, benefits and other options for treatment, the patient has consented to  Procedure(s): ABDOMINAL AORTIC ENDOVASCULAR STENT GRAFT- GORE (N/A) as a surgical intervention .  The patient's history has been reviewed, patient examined, no change in status, stable for surgery.  I have reviewed the patient's chart and labs.  Questions were answered to the patient's satisfaction.     Sabastian Raimondi E

## 2013-07-01 NOTE — Transfer of Care (Signed)
Immediate Anesthesia Transfer of Care Note  Patient: Ashlee Reyes  Procedure(s) Performed: Procedure(s) with comments: ABDOMINAL AORTIC ENDOVASCULAR STENT GRAFT- GORE (N/A) - Ultrasound guided  Patient Location: PACU  Anesthesia Type:General  Level of Consciousness: awake, alert , oriented and patient cooperative  Airway & Oxygen Therapy: Patient Spontanous Breathing and Patient connected to nasal cannula oxygen  Post-op Assessment: Report given to PACU RN, Post -op Vital signs reviewed and stable and Patient moving all extremities X 4  Post vital signs: Reviewed and stable  Complications: No apparent anesthesia complications

## 2013-07-01 NOTE — Anesthesia Procedure Notes (Signed)
Procedure Name: Intubation Date/Time: 07/01/2013 8:49 AM Performed by: Rogelia Boga Pre-anesthesia Checklist: Patient identified, Emergency Drugs available, Suction available, Patient being monitored and Timeout performed Patient Re-evaluated:Patient Re-evaluated prior to inductionOxygen Delivery Method: Circle system utilized Preoxygenation: Pre-oxygenation with 100% oxygen Intubation Type: IV induction Ventilation: Mask ventilation without difficulty Laryngoscope Size: Mac and 4 Grade View: Grade III Tube type: Oral Tube size: 7.5 mm Number of attempts: 1 Airway Equipment and Method: Stylet Placement Confirmation: positive ETCO2 and breath sounds checked- equal and bilateral Secured at: 20 cm Tube secured with: Tape Dental Injury: Teeth and Oropharynx as per pre-operative assessment

## 2013-07-01 NOTE — Anesthesia Preprocedure Evaluation (Addendum)
Anesthesia Evaluation  Patient identified by MRN, date of birth, ID band Patient awake    Reviewed: Allergy & Precautions, H&P , NPO status , Patient's Chart, lab work & pertinent test results  Airway Mallampati: II TM Distance: >3 FB Neck ROM: full    Dental  (+) Dental Advisory Given   Pulmonary asthma ,          Cardiovascular + CAD and + Peripheral Vascular Disease     Neuro/Psych Depression  Neuromuscular disease    GI/Hepatic GERD-  Medicated and Controlled,IBS   Endo/Other    Renal/GU      Musculoskeletal  (+) Arthritis -, Osteoarthritis,    Abdominal   Peds  Hematology   Anesthesia Other Findings   Reproductive/Obstetrics                          Anesthesia Physical Anesthesia Plan  ASA: III  Anesthesia Plan: General   Post-op Pain Management:    Induction: Intravenous  Airway Management Planned: Oral ETT  Additional Equipment: Arterial line  Intra-op Plan:   Post-operative Plan: Extubation in OR  Informed Consent: I have reviewed the patients History and Physical, chart, labs and discussed the procedure including the risks, benefits and alternatives for the proposed anesthesia with the patient or authorized representative who has indicated his/her understanding and acceptance.   Dental advisory given  Plan Discussed with: CRNA, Anesthesiologist and Surgeon  Anesthesia Plan Comments:        Anesthesia Quick Evaluation

## 2013-07-01 NOTE — H&P (View-Only) (Signed)
Patient is an 79-year-old female who presents for followup today of a known abdominal aortic aneurysm. She has chronic back pain. She denies any new abdominal or back pain. The aneurysm was 4.7 cm in December of 2013. CTA from today shows the AAA is now 5.3 cm. Chronic medical problems include irritable bowel syndrome coronary artery disease hyperlipidemia and asthma all of which are currently stable.  Recent cardiac stress test was negative.  Past Medical History   Diagnosis  Date   .  Osteoarthritis      spine/spinal stenosis   .  GERD (gastroesophageal reflux disease)    .  Hemorrhoids    .  IC (irritable colon)      DMSO in past   .  Allergy      allergic rhinitis   .  IBS (irritable bowel syndrome)    .  Diverticulosis    .  CAD (coronary artery disease)  07/2009     mod by cath 08/10   .  AAA (abdominal aortic aneurysm)      followed by Dr Maudry Zeidan. Last u/s07/10 stable AAA with largest measurement 3.97 x 4.02cm.   .  Cancer      basal cell cancer - face.   .  Osteopenia  2011   .  Hyperlipidemia    .  Depression    .  Asthma     Past Surgical History   Procedure  Laterality  Date   .  Right colectomy   1997     benign   .  Abdominal hysterectomy       partial not cancer   .  Cystocele repair     .  Rectocele repair     .  Hernia repair   11/1997     umbilical hernia   .  Esophagogastroduodenoscopy   09/1998     erosive esphagitis ? stricture treated 12/2002   .  Refractive surgery       for film after cataract surgery.   .  Skin lesion excision   05/2011     basal cell skin lesion removed.    History    Social History   .  Marital Status:  Married     Spouse Name:  N/A     Number of Children:  N/A   .  Years of Education:  N/A    Occupational History   .  Not on file.    Social History Main Topics   .  Smoking status:  Never Smoker   .  Smokeless tobacco:  Never Used   .  Alcohol Use:  No   .  Drug Use:  No   .  Sexually Active:  Not on file    Other  Topics  Concern   .  Not on file    Social History Narrative   .  No narrative on file    Family History   Problem  Relation  Age of Onset   .  Heart disease  Mother      deceased age 83 heart attack.   .  Heart attack  Mother    .  Other  Mother      varicose veins   .  Heart disease  Father      age 59 heart attack   .  Cancer  Father    .  Stroke  Brother    .  Cancer  Brother    .  Heart   disease  Brother    .  Heart attack  Brother    .  Cancer  Daughter    .  Diabetes  Son     Review of systems: Cardiac has shortness of breath with exertion, pulmonary has occasional asthma flareups, neurologic bilateral lower extremity weakness, ENT has some occasional swallowing difficulty followed by Dr.Vaught at [place] all other systems were reviewed and are negative   Physical exam:  Filed Vitals:   06/25/13 1342  BP: 137/65  Pulse: 86  Height: 5' 6.5" (1.689 m)  Weight: 166 lb 11.2 oz (75.615 kg)  SpO2: 100%   Neck: No carotid bruits  Chest: Clear to auscultation bilaterally  Cardiac: Regular rate and rhythm without murmur  Abdomen: Soft nontender easily palpable pulsatile mass  Extremities: 2+ femoral pulses bilaterally  Neuro: Symmetric upper and lower extremity motor strength which is 5 over 5   Data: Her abdominal aneurysm is now 5.6 cm on ultrasound.  AAA 5.3 cm on CTA today no iliac component, but does have right CIA stenosis   Assessment: Enlarging abdominal aortic aneurysm which warrants repair.  Plan: Gore excluder stent graft 07/01/13.  Risks benefits possible complications procedure details discussed with pt and daughter.  Terita Hejl, MD  [facility]  Office: [phone]  Pager: [phone]  

## 2013-07-01 NOTE — Telephone Encounter (Addendum)
Message copied by Rosalyn Charters on Wed Jul 01, 2013  1:01 PM ------      Message from: [place], [place] K      Created: Wed Jul 01, 2013 11:16 AM      Regarding: schedule                   ----- Message -----         From: Dara Lords, PA-C         Sent: 07/01/2013  11:10 AM           To: Sharee Pimple, CMA            S/p EVAR on 07/01/13.  F/u in 4 weeks with CTA protocol with Dr. Darrick Penna.            Thanks,      Lelon Mast ------  notified patient of fu appt. with dr. Darrick Penna on 08-06-13, cta at [facility] at 1:00 then fu with dr. fields in the office at 2:15

## 2013-07-01 NOTE — Anesthesia Postprocedure Evaluation (Signed)
Anesthesia Post Note  Patient: Ashlee Reyes  Procedure(s) Performed: Procedure(s) (LRB): ABDOMINAL AORTIC ENDOVASCULAR STENT GRAFT- GORE (N/A)  Anesthesia type: General  Patient location: PACU  Post pain: Pain level controlled and Adequate analgesia  Post assessment: Post-op Vital signs reviewed, Patient's Cardiovascular Status Stable, Respiratory Function Stable, Patent Airway and Pain level controlled  Last Vitals:  Filed Vitals:   07/01/13 1255  BP:   Pulse:   Temp: 36.2 C  Resp:     Post vital signs: Reviewed and stable  Level of consciousness: awake, alert  and oriented  Complications: No apparent anesthesia complications

## 2013-07-01 NOTE — OR Nursing (Signed)
bilat hearing aids placed per pt's instructions

## 2013-07-02 ENCOUNTER — Encounter (HOSPITAL_COMMUNITY): Payer: Self-pay | Admitting: *Deleted

## 2013-07-02 LAB — BASIC METABOLIC PANEL
CO2: 27 mEq/L (ref 19–32)
Calcium: 8.3 mg/dL — ABNORMAL LOW (ref 8.4–10.5)
GFR calc non Af Amer: 73 mL/min — ABNORMAL LOW (ref >90)
Potassium: 3.8 mEq/L (ref 3.5–5.1)
Sodium: 141 mEq/L (ref 135–145)

## 2013-07-02 LAB — CBC
MCH: 29.9 pg (ref 26.0–34.0)
Platelets: 158 10*3/uL (ref 150–400)
RBC: 3.55 MIL/uL — ABNORMAL LOW (ref 3.87–5.11)
WBC: 8 10*3/uL (ref 4.0–10.5)

## 2013-07-02 NOTE — Progress Notes (Signed)
Utilization review completed.  

## 2013-07-02 NOTE — Discharge Summary (Signed)
[facility] Discharge Summary  Ashlee Reyes 22-Sep-1921 79 y.o. female  [id number]  Admission Date: 07/01/2013  Discharge Date: 07/02/13  Physician: Sherren Kerns, MD  Admission Diagnosis: Abdominal Aortic Aneurysm   HPI:   This is a 79 y.o. female who presents for followup today of a known abdominal aortic aneurysm. She has chronic back pain. She denies any new abdominal or back pain. The aneurysm was 4.7 cm in December of 2013. CTA from today shows the AAA is now 5.3 cm. Chronic medical problems include irritable bowel syndrome coronary artery disease hyperlipidemia and asthma all of which are currently stable. Recent cardiac stress test was negative  Hospital Course:  The patient was admitted to the hospital and taken to the operating room on 07/01/2013 and underwent: EVAR   The pt tolerated the procedure well and was transported to the PACU in good condition. By POD 1, the pt is doing well with palpable pulse in right and doppler signal in left foot.  Pt's foley was removed and pt was able to void.  The remainder of the hospital course consisted of increasing mobilization and increasing intake of solids without difficulty.  CBC    Component Value Date/Time   WBC 8.0 07/02/2013 0530   RBC 3.55* 07/02/2013 0530   HGB 10.6* 07/02/2013 0530   HCT 31.9* 07/02/2013 0530   PLT 158 07/02/2013 0530   MCV 89.9 07/02/2013 0530   MCH 29.9 07/02/2013 0530   MCHC 33.2 07/02/2013 0530   RDW 14.6 07/02/2013 0530   LYMPHSABS 2.6 01/06/2013 0813   MONOABS 0.7 01/06/2013 0813   EOSABS 0.1 01/06/2013 0813   BASOSABS 0.1 01/06/2013 0813    BMET    Component Value Date/Time   NA 141 07/02/2013 0530   K 3.8 07/02/2013 0530   CL 108 07/02/2013 0530   CO2 27 07/02/2013 0530   GLUCOSE 106* 07/02/2013 0530   BUN 11 07/02/2013 0530   CREATININE 0.76 07/02/2013 0530   CREATININE 0.85 06/22/2013 1019   CALCIUM 8.3* 07/02/2013 0530   GFRNONAA 73* 07/02/2013 0530   GFRAA 84* 07/02/2013  0530     Discharge Instructions:   The patient is discharged to home with extensive instructions on wound care and progressive ambulation.  They are instructed not to drive or perform any heavy lifting until returning to see the physician in his office.  Discharge Orders   Future Appointments Provider Department Dept Phone   08/06/2013 1:20 PM [facility] [place] [facility] [phone]   08/06/2013 2:15 PM Sherren Kerns, MD [facility] [phone]   Future Orders Complete By Expires     ABDOMINAL PROCEDURE/ANEURYSM REPAIR/AORTO-BIFEMORAL BYPASS:  Call MD for increased abdominal pain; cramping diarrhea; nausea/vomiting  As directed     Call MD for:  redness, tenderness, or signs of infection (pain, swelling, bleeding, redness, odor or green/yellow discharge around incision site)  As directed     Call MD for:  severe or increased pain, loss or decreased feeling  in affected limb(s)  As directed     Call MD for:  temperature >100.5  As directed     Discharge wound care:  As directed     Comments:      Wash the groin wound with soap and water daily and pat dry. (No tub bath-only shower)  Then put a dry gauze or washcloth there to keep this area dry daily and as needed.  Do not  use Vaseline or neosporin on your incisions.  Only use soap and water on your incisions and then protect and keep dry.    Driving Restrictions  As directed     Comments:      No driving for 2 weeks    Lifting restrictions  As directed     Comments:      No lifting for 4 weeks    Resume previous diet  As directed        Discharge Diagnosis:  Abdominal Aortic Aneurysm  Secondary Diagnosis: Patient Active Problem List   Diagnosis Date Noted  . Abdominal aneurysm without mention of rupture 06/25/2013  . Gastroenteritis and colitis, viral 01/22/2013  . Osteopenia 01/13/2013  . Abdominal aortic aneurysm 10/10/2011  . MIXED HEARING LOSS BILATERAL 06/22/2010   . POSTNASAL DRIP SYNDROME 01/20/2010  . C A D 07/28/2009  . DISORDER, DEPRESSIVE NEC 06/03/2007  . HYPERCHOLESTEROLEMIA 06/02/2007  . REACTIVE AIRWAY DISEASE 06/02/2007  . GERD 06/02/2007  . DUODENITIS 06/02/2007  . HIATAL HERNIA 06/02/2007  . IBS 06/02/2007  . INTERSTITIAL CYSTITIS 06/02/2007  . OSTEOARTHRITIS 06/02/2007  . DEGENERATIVE JOINT DISEASE, LUMBAR SPINE 06/02/2007  . ALLERGY 06/02/2007  . DIVERTICULITIS, COLON W/O HEM 05/19/2007  . HX, URINARY INFECTION 05/19/2007   Past Medical History  Diagnosis Date  . Osteoarthritis     spine/spinal stenosis  . GERD (gastroesophageal reflux disease)   . Hemorrhoids   . IC (irritable colon)     DMSO in past  . Allergy     allergic rhinitis  . IBS (irritable bowel syndrome)   . Diverticulosis   . CAD (coronary artery disease) 07/2009    mod by cath 08/10  . AAA (abdominal aortic aneurysm)     followed by Dr Darrick Penna. Last u/s07/10 stable AAA with largest measurement 3.97 x 4.02cm.  . Cancer     basal cell cancer - face.  . Osteopenia 2011  . Hyperlipidemia   . Depression   . Asthma   . Interstitial cystitis   . History of recurrent UTIs   . Disc disease, degenerative, lumbar or lumbosacral     SPINAL STENOSIS       Medication List         acetaminophen 500 MG tablet  Commonly known as:  TYLENOL  Take 500-1,000 mg by mouth every 4 (four) hours as needed. For pain     albuterol 108 (90 BASE) MCG/ACT inhaler  Commonly known as:  PROAIR HFA  Inhale 2 puffs into the lungs every 4 (four) hours as needed for wheezing. 2 puffs up to every 4 hours as needed for wheezing     aspirin 81 MG tablet  Take 81 mg by mouth daily.     atorvastatin 10 MG tablet  Commonly known as:  LIPITOR  Take 1 tablet (10 mg total) by mouth daily.     beclomethasone 40 MCG/ACT inhaler  Commonly known as:  QVAR  Inhale 2 puffs into the lungs 2 (two) times daily.     cetirizine 10 MG tablet  Commonly known as:  ZYRTEC  Take 10 mg by  mouth daily as needed. For allergies     esomeprazole 40 MG capsule  Commonly known as:  NEXIUM  Take 40 mg by mouth daily before breakfast.     HEALTHY COLON Caps  Take 1 capsule by mouth daily as needed.     ipratropium 0.03 % nasal spray  Commonly known as:  ATROVENT  Place 2 sprays into the  nose as needed.     latanoprost 0.005 % ophthalmic solution  Commonly known as:  XALATAN  Place 1 drop into both eyes at bedtime.     oxyCODONE 5 MG immediate release tablet  Commonly known as:  ROXICODONE  Take 1 tablet (5 mg total) by mouth every 6 (six) hours as needed for pain.     polyethylene glycol packet  Commonly known as:  MIRALAX / GLYCOLAX  Take 17 g by mouth daily as needed. For constipation     SYSTANE OP  Apply to eye as needed.        Roxicodone #30 No Refill  Disposition: home  Patient's condition: is Good  Follow up: 1. Dr. Darrick Penna in 4 weeks with CTA   Doreatha Massed, PA-C [facility] [phone] 07/02/2013  7:25 AM   - For VQI Registry use --- Instructions: Press F2 to tab through selections.  Delete question if not applicable.   Post-op:  Time to Extubation: [ x] In OR, [ ]  < 12 hrs, [ ]  12-24 hrs, [ ]  >=24 hrs Vasopressors Req. Post-op: No MI: no, [ ]  Troponin only, [ ]  EKG or Clinical New Arrhythmia: No CHF: No ICU Stay: 1 day in stepdown Transfusion: No  If yes, n/a units given  Complications: Resp failure: no, [ ]  Pneumonia, [ ]  Ventilator Chg in renal function: no, [ ]  Inc. Cr > 0.5, [ ]  Temp. Dialysis, [ ]  Permanent dialysis Leg ischemia: no, no Surgery needed, [ ]  Yes, Surgery needed, [ ]  Amputation Bowel ischemia: no, [ ]  Medical Rx, [ ]  Surgical Rx Wound complication: no, [ ]  Superficial separation/infection, [ ]  Return to OR Return to OR: No  Return to OR for bleeding: No Stroke: no, [ ]  Minor, [ ]  Major  Discharge medications: Statin use:  Yes If No: [ ]  For Medical reasons, [ ]  Non-compliant, [ ]   Not-indicated ASA use:  No  If No: [ ]  For Medical reasons, [ ]  Non-compliant, [ ]  Not-indicated Plavix use:  No If No: [ ]  For Medical reasons, [ ]  Non-compliant, [ ]  Not-indicated Beta blocker use:  No If No: [ ]  For Medical reasons, [ ]  Non-compliant, [ ]  Not-indicated

## 2013-07-02 NOTE — Progress Notes (Signed)
Pt d/c home per MD order, pt VSS, family at [facility], d/c instructions and prescription given, all questions answered, pt wheeled to car in wheel chair

## 2013-07-02 NOTE — Progress Notes (Addendum)
[facility] Progress Note  07/02/2013 7:16 AM 1 Day Post-Op  Subjective:  "I'm sleepy this morning" otherwise, no complaints  Tm 99 now afebrile HR 70's80's regular 100's-140's systolic 95% RA  Filed Vitals:   07/02/13 0527  BP: 110/48  Pulse: 79  Temp: 98.6 F (37 C)  Resp: 17    Physical Exam: Incisions:  Bilateral groins are soft without hematoma Extremities:  + palpable right DP/+ doppler signal left PT/DP Abdomen:  Soft/NT/ND  CBC    Component Value Date/Time   WBC 8.0 07/02/2013 0530   RBC 3.55* 07/02/2013 0530   HGB 10.6* 07/02/2013 0530   HCT 31.9* 07/02/2013 0530   PLT 158 07/02/2013 0530   MCV 89.9 07/02/2013 0530   MCH 29.9 07/02/2013 0530   MCHC 33.2 07/02/2013 0530   RDW 14.6 07/02/2013 0530   LYMPHSABS 2.6 01/06/2013 0813   MONOABS 0.7 01/06/2013 0813   EOSABS 0.1 01/06/2013 0813   BASOSABS 0.1 01/06/2013 0813    BMET    Component Value Date/Time   NA 141 07/02/2013 0530   K 3.8 07/02/2013 0530   CL 108 07/02/2013 0530   CO2 27 07/02/2013 0530   GLUCOSE 106* 07/02/2013 0530   BUN 11 07/02/2013 0530   CREATININE 0.76 07/02/2013 0530   CREATININE 0.85 06/22/2013 1019   CALCIUM 8.3* 07/02/2013 0530   GFRNONAA 73* 07/02/2013 0530   GFRAA 84* 07/02/2013 0530    INR    Component Value Date/Time   INR 1.15 07/01/2013 1131     Intake/Output Summary (Last 24 hours) at 07/02/13 0716 Last data filed at 07/02/13 0545  Gross per 24 hour  Intake   2645 ml  Output   2825 ml  Net   -180 ml     Assessment/Plan:  79 y.o. female is s/p:  EVAR  1 Day Post-Op  -pt doing well this am -foley out this morning at 6am-pt needs to void -BUN/Cr normal with good UOP -d/c later this am if able to void   Doreatha Massed, PA-C [facility] [phone] 07/02/2013 7:16 AM   Uneventful post op night one Groins without hematoma Ambulate this morning If able to get around D/c home  Fabienne Bruns, MD [facility]  [facility] [place] Office: [phone] Pager: [phone]

## 2013-07-03 ENCOUNTER — Telehealth: Payer: Self-pay

## 2013-07-03 NOTE — Telephone Encounter (Signed)
Daughter, [name] called with several questions.  Reports that pt. had intermittent numbness of left thumb, index, and middle finger yesterday.  Denies numbness today.  States the index finger felt cooler than the other fingers, but denies any change in color.  Also questions care of insertion site (R) groin.  Advised to leave initial bandage intact x 48 hrs, then remove bandage and clean site with antibacterial soap, and apply dry gauze to site.  Encouraged to clean site daily, and cover w/ gauze for next few days, to insure keeping clean/dry.  Reports pt. had fever of 100.3 last evening, and noticed increase in coughing.  Stated pt. C/o a feeling of phlegm in throat, and coughing noted to be more "to clear her throat."  Reports gave pt. Tylenol, and fever decreased to 98.6.  Now reports temperature 98.8 this morning.  Encouraged to continue to monitor temperature and cough, and to report worsening of symptoms.  Advised to monitor left hand for change in color, temperature, or ongoing numbness, and check for radial pulse; report if symptoms worsen.  Encouraged to maintain mobility, and gradually increase activity to pre-procedure level; maintain hydration; offer throat lozenges to soothe throat.  All questions were answered.  Spent 20 minutes discussing concerns with 2 daughters on phone.

## 2013-07-06 ENCOUNTER — Encounter (HOSPITAL_COMMUNITY): Payer: Self-pay | Admitting: Vascular Surgery

## 2013-07-09 ENCOUNTER — Other Ambulatory Visit: Payer: Medicare Other

## 2013-07-09 ENCOUNTER — Ambulatory Visit: Payer: Medicare Other | Admitting: Vascular Surgery

## 2013-08-05 ENCOUNTER — Encounter: Payer: Self-pay | Admitting: Vascular Surgery

## 2013-08-06 ENCOUNTER — Encounter: Payer: Self-pay | Admitting: Vascular Surgery

## 2013-08-06 ENCOUNTER — Ambulatory Visit (INDEPENDENT_AMBULATORY_CARE_PROVIDER_SITE_OTHER): Payer: Medicare Other | Admitting: Vascular Surgery

## 2013-08-06 ENCOUNTER — Ambulatory Visit
Admit: 2013-08-06 | Discharge: 2013-08-06 | Disposition: A | Discharge status: Home / Self Care | Payer: Medicare Other | Attending: Vascular Surgery | Admitting: Vascular Surgery

## 2013-08-06 VITALS — BP 124/72 | HR 87 | Resp 18 | Ht 66.0 in | Wt 166.2 lb

## 2013-08-06 DIAGNOSIS — Z48812 Encounter for surgical aftercare following surgery on the circulatory system: Secondary | ICD-10-CM

## 2013-08-06 DIAGNOSIS — I714 Abdominal aortic aneurysm, without rupture: Secondary | ICD-10-CM

## 2013-08-06 DIAGNOSIS — Z9889 Other specified postprocedural states: Secondary | ICD-10-CM | POA: Diagnosis not present

## 2013-08-06 MED ORDER — IOHEXOL 350 MG/ML SOLN
80.0000 mL | Freq: Once | INTRAVENOUS | Status: AC | PRN
  Administered 2013-08-06: 80 mL via INTRAVENOUS

## 2013-08-06 NOTE — Progress Notes (Signed)
[facility] [place]  Subjective  - The patient is very pleased with her recovery since her AAA EVAR surgery. Her only complaint today is a little constipation.     Objective 124/72 87   18    Palpable DP/PT pulses Incisions healed  Abdomen is soft non tender to palp.No pulsatile mass  CT Scan reviewed with Dr. Darrick Penna today 08/06/2013  Assessment/Planning: S/P EVAR  07/01/2013 No restrictions ambulate daily She will f/u in 5 months with a CT abd/pelvis  Clinton Gallant [facility] 08/06/2013 2:58 PM   History and exam details as above.  No groin hematoma.  No pulsatile mass  CT angio suggested type II endoleak but on non contrast views this looks like calcification. 5.1x5.6 cm diameter  Follow up CT 6 months post procedure will go to ultrasound if that looks good  Fabienne Bruns, MD [facility] [place] Office: [phone] Pager: [phone]

## 2013-08-06 NOTE — Addendum Note (Signed)
Addended by: Adria Dill L on: 08/06/2013 04:20 PM   Modules accepted: Orders

## 2013-08-11 ENCOUNTER — Telehealth: Payer: Self-pay

## 2013-08-11 MED ORDER — BECLOMETHASONE DIPROPIONATE 40 MCG/ACT IN AERS: 2.0000 | INHALATION_SPRAY | Freq: Two times a day (BID) | RESPIRATORY_TRACT | Status: DC

## 2013-08-11 NOTE — Telephone Encounter (Signed)
That is ok  I will send in px

## 2013-08-11 NOTE — Telephone Encounter (Signed)
Ashlee Reyes pts daughter left v/m requesting refill Qvar 80 mcg with instructions inhale 1 puff twice a day to prevent cough or wheezing;rinse, gargle and spit after use; pt no longer sees Dr Darci Needle who prescribed this med and request Dr Milinda Antis to refill to [facility] [address]Please advise. Not on med list.

## 2013-08-12 MED ORDER — BECLOMETHASONE DIPROPIONATE 80 MCG/ACT IN AERS: 1.0000 | INHALATION_SPRAY | Freq: Two times a day (BID) | RESPIRATORY_TRACT | Status: DC

## 2013-08-12 NOTE — Telephone Encounter (Signed)
If pharmacy will let you return it - I will send in the 80 mcg now (if not just use what you have 2 puffs twice daily and the 80 will be there for the next fill)

## 2013-08-12 NOTE — Telephone Encounter (Signed)
Daughter notified Rx sent to pharmacy  

## 2013-08-12 NOTE — Telephone Encounter (Signed)
Pt notified she can return inhaler if pharmacy will let her if not she can use 2 puffs BID until that inhaler runs out

## 2013-08-12 NOTE — Telephone Encounter (Signed)
[name] pts daughter left v/m when picked up Qvar it was the 40 mcg; [name] wants to know if should return to pharmacy or adjust directions since pt has been using Qvar 80 mcg. Linda request cb.

## 2013-08-12 NOTE — Addendum Note (Signed)
Addended by: [name] A on: 08/12/2013 04:51 PM   Modules accepted: Orders, Medications

## 2013-09-01 DIAGNOSIS — R490 Dysphonia: Secondary | ICD-10-CM | POA: Diagnosis not present

## 2013-09-01 DIAGNOSIS — R1310 Dysphagia, unspecified: Secondary | ICD-10-CM | POA: Diagnosis not present

## 2013-09-01 DIAGNOSIS — K219 Gastro-esophageal reflux disease without esophagitis: Secondary | ICD-10-CM | POA: Diagnosis not present

## 2013-09-01 DIAGNOSIS — R05 Cough: Secondary | ICD-10-CM | POA: Diagnosis not present

## 2013-09-30 ENCOUNTER — Ambulatory Visit (INDEPENDENT_AMBULATORY_CARE_PROVIDER_SITE_OTHER): Payer: Medicare Other

## 2013-09-30 DIAGNOSIS — Z23 Encounter for immunization: Secondary | ICD-10-CM

## 2013-10-17 ENCOUNTER — Observation Stay (HOSPITAL_COMMUNITY)
Admission: EM | Admit: 2013-10-17 | Discharge: 2013-10-19 | Disposition: A | Discharge status: Home / Self Care | Payer: Medicare Other | Attending: Internal Medicine | Admitting: Internal Medicine

## 2013-10-17 ENCOUNTER — Emergency Department (HOSPITAL_COMMUNITY): Payer: Medicare Other

## 2013-10-17 ENCOUNTER — Encounter (HOSPITAL_COMMUNITY): Payer: Self-pay | Admitting: Emergency Medicine

## 2013-10-17 DIAGNOSIS — Z79899 Other long term (current) drug therapy: Secondary | ICD-10-CM | POA: Insufficient documentation

## 2013-10-17 DIAGNOSIS — F329 Major depressive disorder, single episode, unspecified: Secondary | ICD-10-CM

## 2013-10-17 DIAGNOSIS — J45909 Unspecified asthma, uncomplicated: Secondary | ICD-10-CM | POA: Diagnosis not present

## 2013-10-17 DIAGNOSIS — K429 Umbilical hernia without obstruction or gangrene: Secondary | ICD-10-CM | POA: Diagnosis not present

## 2013-10-17 DIAGNOSIS — Z888 Allergy status to other drugs, medicaments and biological substances status: Secondary | ICD-10-CM | POA: Diagnosis not present

## 2013-10-17 DIAGNOSIS — M8580 Other specified disorders of bone density and structure, unspecified site: Secondary | ICD-10-CM

## 2013-10-17 DIAGNOSIS — R110 Nausea: Secondary | ICD-10-CM | POA: Insufficient documentation

## 2013-10-17 DIAGNOSIS — I441 Atrioventricular block, second degree: Secondary | ICD-10-CM | POA: Diagnosis present

## 2013-10-17 DIAGNOSIS — G8929 Other chronic pain: Secondary | ICD-10-CM | POA: Insufficient documentation

## 2013-10-17 DIAGNOSIS — I2510 Atherosclerotic heart disease of native coronary artery without angina pectoris: Secondary | ICD-10-CM | POA: Diagnosis not present

## 2013-10-17 DIAGNOSIS — F3289 Other specified depressive episodes: Secondary | ICD-10-CM

## 2013-10-17 DIAGNOSIS — Z9861 Coronary angioplasty status: Secondary | ICD-10-CM | POA: Insufficient documentation

## 2013-10-17 DIAGNOSIS — Z7982 Long term (current) use of aspirin: Secondary | ICD-10-CM | POA: Diagnosis not present

## 2013-10-17 DIAGNOSIS — R55 Syncope and collapse: Secondary | ICD-10-CM | POA: Diagnosis not present

## 2013-10-17 DIAGNOSIS — K219 Gastro-esophageal reflux disease without esophagitis: Secondary | ICD-10-CM | POA: Diagnosis not present

## 2013-10-17 DIAGNOSIS — R109 Unspecified abdominal pain: Secondary | ICD-10-CM | POA: Insufficient documentation

## 2013-10-17 DIAGNOSIS — N281 Cyst of kidney, acquired: Secondary | ICD-10-CM | POA: Diagnosis not present

## 2013-10-17 DIAGNOSIS — Z87440 Personal history of urinary (tract) infections: Secondary | ICD-10-CM | POA: Diagnosis not present

## 2013-10-17 DIAGNOSIS — K589 Irritable bowel syndrome without diarrhea: Secondary | ICD-10-CM | POA: Diagnosis not present

## 2013-10-17 DIAGNOSIS — E7800 Pure hypercholesterolemia, unspecified: Secondary | ICD-10-CM

## 2013-10-17 DIAGNOSIS — M1990 Unspecified osteoarthritis, unspecified site: Secondary | ICD-10-CM

## 2013-10-17 DIAGNOSIS — Z85828 Personal history of other malignant neoplasm of skin: Secondary | ICD-10-CM | POA: Diagnosis not present

## 2013-10-17 DIAGNOSIS — IMO0002 Reserved for concepts with insufficient information to code with codable children: Secondary | ICD-10-CM | POA: Diagnosis not present

## 2013-10-17 DIAGNOSIS — M899 Disorder of bone, unspecified: Secondary | ICD-10-CM | POA: Insufficient documentation

## 2013-10-17 DIAGNOSIS — N390 Urinary tract infection, site not specified: Secondary | ICD-10-CM

## 2013-10-17 DIAGNOSIS — R079 Chest pain, unspecified: Secondary | ICD-10-CM | POA: Diagnosis not present

## 2013-10-17 DIAGNOSIS — E785 Hyperlipidemia, unspecified: Secondary | ICD-10-CM | POA: Insufficient documentation

## 2013-10-17 DIAGNOSIS — R404 Transient alteration of awareness: Secondary | ICD-10-CM | POA: Diagnosis not present

## 2013-10-17 DIAGNOSIS — R51 Headache: Secondary | ICD-10-CM | POA: Diagnosis not present

## 2013-10-17 DIAGNOSIS — N3010 Interstitial cystitis (chronic) without hematuria: Secondary | ICD-10-CM | POA: Diagnosis not present

## 2013-10-17 DIAGNOSIS — I714 Abdominal aortic aneurysm, without rupture, unspecified: Secondary | ICD-10-CM

## 2013-10-17 DIAGNOSIS — M48061 Spinal stenosis, lumbar region without neurogenic claudication: Secondary | ICD-10-CM

## 2013-10-17 HISTORY — DX: Unspecified malignant neoplasm of skin, unspecified: C44.90

## 2013-10-17 HISTORY — DX: Other seasonal allergic rhinitis: J30.2

## 2013-10-17 LAB — CBC WITH DIFFERENTIAL/PLATELET
Basophils Absolute: 0 10*3/uL (ref 0.0–0.1)
Basophils Relative: 0 % (ref 0–1)
Eosinophils Absolute: 0.1 10*3/uL (ref 0.0–0.7)
Eosinophils Relative: 1 % (ref 0–5)
MCH: 30.4 pg (ref 26.0–34.0)
MCV: 88.8 fL (ref 78.0–100.0)
Neutrophils Relative %: 74 % (ref 43–77)
Platelets: 214 10*3/uL (ref 150–400)
RBC: 4.54 MIL/uL (ref 3.87–5.11)
RDW: 14.4 % (ref 11.5–15.5)

## 2013-10-17 LAB — URINALYSIS, ROUTINE W REFLEX MICROSCOPIC
Bilirubin Urine: NEGATIVE
Ketones, ur: 15 mg/dL — AB
Nitrite: POSITIVE — AB
Protein, ur: NEGATIVE mg/dL
Specific Gravity, Urine: 1.017 (ref 1.005–1.030)
Urobilinogen, UA: 0.2 mg/dL (ref 0.0–1.0)

## 2013-10-17 LAB — BASIC METABOLIC PANEL
BUN: 18 mg/dL (ref 6–23)
Calcium: 9.2 mg/dL (ref 8.4–10.5)
Chloride: 104 mEq/L (ref 96–112)
Creatinine, Ser: 0.81 mg/dL (ref 0.50–1.10)
GFR calc Af Amer: 72 mL/min — ABNORMAL LOW (ref >90)
GFR calc non Af Amer: 62 mL/min — ABNORMAL LOW (ref >90)
Glucose, Bld: 114 mg/dL — ABNORMAL HIGH (ref 70–99)
Sodium: 140 mEq/L (ref 135–145)

## 2013-10-17 LAB — POCT I-STAT TROPONIN I: Troponin i, poc: 0 ng/mL (ref 0.00–0.08)

## 2013-10-17 LAB — URINE MICROSCOPIC-ADD ON

## 2013-10-17 LAB — TROPONIN I: Troponin I: <0.3 ng/mL (ref <0.30)

## 2013-10-17 MED ORDER — ACETAMINOPHEN 500 MG PO TABS: 500.0000 mg | ORAL_TABLET | ORAL | Status: DC | PRN

## 2013-10-17 MED ORDER — ENOXAPARIN SODIUM 40 MG/0.4ML ~~LOC~~ SOLN
40.0000 mg | SUBCUTANEOUS | Status: DC
  Administered 2013-10-17 – 2013-10-18 (×2): 40 mg via SUBCUTANEOUS
  Filled 2013-10-17 (×3): qty 0.4

## 2013-10-17 MED ORDER — ALUM & MAG HYDROXIDE-SIMETH 200-200-20 MG/5ML PO SUSP
30.0000 mL | Freq: Four times a day (QID) | ORAL | Status: DC | PRN
Start: 2013-10-17 — End: 2013-10-19

## 2013-10-17 MED ORDER — IOHEXOL 350 MG/ML SOLN
100.0000 mL | Freq: Once | INTRAVENOUS | Status: AC | PRN
  Administered 2013-10-17: 100 mL via INTRAVENOUS

## 2013-10-17 MED ORDER — ONDANSETRON HCL 4 MG PO TABS
4.0000 mg | ORAL_TABLET | Freq: Four times a day (QID) | ORAL | Status: DC | PRN
  Administered 2013-10-18: 4 mg via ORAL
  Filled 2013-10-17: qty 1

## 2013-10-17 MED ORDER — PANTOPRAZOLE SODIUM 40 MG PO TBEC
80.0000 mg | DELAYED_RELEASE_TABLET | Freq: Every day | ORAL | Status: DC
  Administered 2013-10-18 – 2013-10-19 (×2): 80 mg via ORAL
  Filled 2013-10-17 (×2): qty 2

## 2013-10-17 MED ORDER — ALBUTEROL SULFATE HFA 108 (90 BASE) MCG/ACT IN AERS: 2.0000 | INHALATION_SPRAY | RESPIRATORY_TRACT | Status: DC | PRN

## 2013-10-17 MED ORDER — LATANOPROST 0.005 % OP SOLN
1.0000 [drp] | Freq: Every day | OPHTHALMIC | Status: DC
  Administered 2013-10-17 – 2013-10-18 (×2): 1 [drp] via OPHTHALMIC
  Filled 2013-10-17: qty 2.5

## 2013-10-17 MED ORDER — ONDANSETRON HCL 4 MG/2ML IJ SOLN: 4.0000 mg | Freq: Four times a day (QID) | INTRAMUSCULAR | Status: DC | PRN

## 2013-10-17 MED ORDER — IPRATROPIUM BROMIDE 0.03 % NA SOLN: 2.0000 | NASAL | Status: DC | PRN

## 2013-10-17 MED ORDER — SODIUM CHLORIDE 0.9 % IJ SOLN
3.0000 mL | Freq: Two times a day (BID) | INTRAMUSCULAR | Status: DC
  Administered 2013-10-18 – 2013-10-19 (×3): 3 mL via INTRAVENOUS

## 2013-10-17 MED ORDER — NITROFURANTOIN MONOHYD MACRO 100 MG PO CAPS
100.0000 mg | ORAL_CAPSULE | Freq: Two times a day (BID) | ORAL | Status: DC
  Administered 2013-10-17 – 2013-10-19 (×4): 100 mg via ORAL
  Filled 2013-10-17 (×5): qty 1

## 2013-10-17 MED ORDER — SODIUM CHLORIDE 0.9 % IV SOLN: 250.0000 mL | INTRAVENOUS | Status: DC | PRN

## 2013-10-17 MED ORDER — POLYETHYLENE GLYCOL 3350 17 G PO PACK
17.0000 g | PACK | Freq: Every day | ORAL | Status: DC | PRN
  Filled 2013-10-17 (×2): qty 1

## 2013-10-17 MED ORDER — ASPIRIN 81 MG PO TABS
81.0000 mg | ORAL_TABLET | Freq: Every day | ORAL | Status: DC
  Administered 2013-10-17: 81 mg via ORAL
  Filled 2013-10-17 (×3): qty 1

## 2013-10-17 MED ORDER — LORATADINE 10 MG PO TABS
10.0000 mg | ORAL_TABLET | Freq: Every day | ORAL | Status: DC
  Administered 2013-10-18 – 2013-10-19 (×2): 10 mg via ORAL
  Filled 2013-10-17 (×3): qty 1

## 2013-10-17 MED ORDER — FLUTICASONE PROPIONATE HFA 44 MCG/ACT IN AERO
1.0000 | INHALATION_SPRAY | Freq: Two times a day (BID) | RESPIRATORY_TRACT | Status: DC
  Administered 2013-10-18 – 2013-10-19 (×3): 1 via RESPIRATORY_TRACT
  Filled 2013-10-17: qty 10.6

## 2013-10-17 MED ORDER — IOHEXOL 300 MG/ML  SOLN
25.0000 mL | Freq: Once | INTRAMUSCULAR | Status: AC | PRN
  Administered 2013-10-17: 25 mL via ORAL

## 2013-10-17 MED ORDER — SODIUM CHLORIDE 0.9 % IJ SOLN: 3.0000 mL | INTRAMUSCULAR | Status: DC | PRN

## 2013-10-17 NOTE — ED Notes (Addendum)
Received via EMS.  Pt alert. .  Daughter at bedside states pt had gotten out of shower this morning and felt tired and weak, pt leaned back and daughter states pt lost consciousness x 90 seconds, then started c/o nausea, headache.  Still with nausea, denies chest pain, abdominal pain and headache.

## 2013-10-17 NOTE — ED Provider Notes (Signed)
Medical screening examination/treatment/procedure(s) were conducted as a shared visit with non-physician practitioner(s) and myself.  I personally evaluated the patient during the encounter.  EKG Interpretation   None      Patient has benign exam but 2 syncope episodes with prodromes. Concerning given her age and risk factors. Benign exam, neg CTA (due to recent AAA surgery). Will treat UTI and admit.    Audree Camel, MD 10/17/13 [phone]

## 2013-10-17 NOTE — ED Notes (Signed)
Patient transported to CT 

## 2013-10-17 NOTE — ED Provider Notes (Signed)
CSN: [id number]     Arrival date & time 10/17/13  1107 History   First MD Initiated Contact with Patient 10/17/13 1109     Chief Complaint  Patient presents with  . Loss of Consciousness   (Consider location/radiation/quality/duration/timing/severity/associated sxs/prior Treatment) HPI Comments: Patient presents emergency department with chief complaint of syncopal episode. She is accompanied by her daughter, who states that after getting out of the shower this morning, the patient felt very tired and weak. She states that as she was doing the patient's care, the patient passed out for approximately 90 seconds. The event was not accompanied with a fall. The daughter states that she was able to hold the patient in a chair while she was unconscious. The patient awoke, she complained of nausea. She denies chest pain, shortness of breath. She states that she has chronic abdominal pain, and is not experiencing any new pain. She also had a recent abdominal aortic aneurysm repair in July.  The history is provided by the patient. No language interpreter was used.    Past Medical History  Diagnosis Date  . Osteoarthritis     spine/spinal stenosis  . GERD (gastroesophageal reflux disease)   . Hemorrhoids   . IC (irritable colon)     DMSO in past  . Allergy     allergic rhinitis  . IBS (irritable bowel syndrome)   . Diverticulosis   . CAD (coronary artery disease) 07/2009    mod by cath 08/10  . AAA (abdominal aortic aneurysm)     followed by Dr Darrick Penna. Last u/s07/10 stable AAA with largest measurement 3.97 x 4.02cm.  . Cancer     basal cell cancer - face.  . Osteopenia 2011  . Hyperlipidemia   . Depression   . Asthma   . Interstitial cystitis   . History of recurrent UTIs   . Disc disease, degenerative, lumbar or lumbosacral     SPINAL STENOSIS   Past Surgical History  Procedure Laterality Date  . Right colectomy  1997    benign  . Abdominal hysterectomy      partial not cancer  .  Cystocele repair    . Rectocele repair    . Hernia repair  11/1997    umbilical hernia  . Esophagogastroduodenoscopy  09/1998    erosive esphagitis ? stricture treated 12/2002  . Refractive surgery      for film after cataract surgery.  . Skin lesion excision  05/2011    basal cell skin lesion removed.  . Breast surgery      lumpectomy  . Abdominal aortic endovascular stent graft N/A 07/01/2013    Procedure: ABDOMINAL AORTIC ENDOVASCULAR STENT GRAFT- GORE;  Surgeon: Sherren Kerns, MD;  Location: [facility] OR;  Service: Vascular;  Laterality: N/A;  Ultrasound guided   Family History  Problem Relation Age of Onset  . Heart disease Mother     deceased age 70 heart attack.  Marland Kitchen Heart attack Mother   . Other Mother     varicose veins  . Heart disease Father     age 77 heart attack  . Cancer Father   . Stroke Brother   . Cancer Brother   . Heart disease Brother   . Heart attack Brother   . Cancer Daughter   . Diabetes Son    History  Substance Use Topics  . Smoking status: Never Smoker   . Smokeless tobacco: Never Used  . Alcohol Use: No   OB History   Grav Para  Term Preterm Abortions TAB SAB Ect Mult Living                 Review of Systems  All other systems reviewed and are negative.    Allergies  Amoxicillin-pot clavulanate; Avelox; Ibuprofen; Keflex; Omeprazole; Rosuvastatin; Sertraline hcl; and Tetanus toxoids  Home Medications   Current Outpatient Rx  Name  Route  Sig  Dispense  Refill  . acetaminophen (TYLENOL) 500 MG tablet   Oral   Take 500-1,000 mg by mouth every 4 (four) hours as needed. For pain         . albuterol (PROAIR HFA) 108 (90 BASE) MCG/ACT inhaler   Inhalation   Inhale 2 puffs into the lungs every 4 (four) hours as needed for wheezing. 2 puffs up to every 4 hours as needed for wheezing   1 Inhaler   11   . aspirin 81 MG tablet   Oral   Take 81 mg by mouth daily.         Marland Kitchen atorvastatin (LIPITOR) 10 MG tablet   Oral   Take 1 tablet (10  mg total) by mouth daily.   30 tablet   3   . beclomethasone (QVAR) 80 MCG/ACT inhaler   Inhalation   Inhale 1 puff into the lungs 2 (two) times daily.   1 Inhaler   11   . cetirizine (ZYRTEC) 10 MG tablet   Oral   Take 10 mg by mouth daily as needed. For allergies         . esomeprazole (NEXIUM) 40 MG capsule   Oral   Take 40 mg by mouth daily before breakfast.         . ipratropium (ATROVENT) 0.03 % nasal spray   Nasal   Place 2 sprays into the nose as needed.         . latanoprost (XALATAN) 0.005 % ophthalmic solution   Both Eyes   Place 1 drop into both eyes at bedtime.           Marland Kitchen oxyCODONE (ROXICODONE) 5 MG immediate release tablet   Oral   Take 1 tablet (5 mg total) by mouth every 6 (six) hours as needed for pain.   30 tablet   0   . Polyethyl Glycol-Propyl Glycol (SYSTANE OP)   Ophthalmic   Apply to eye as needed.         . polyethylene glycol (MIRALAX / GLYCOLAX) packet   Oral   Take 17 g by mouth daily as needed. For constipation         . Probiotic Product (HEALTHY COLON) CAPS   Oral   Take 1 capsule by mouth daily as needed.           BP 154/59  Pulse 64  Temp(Src) 97.8 F (36.6 C) (Oral)  Resp 18  SpO2 99%  LMP 12/17/1968 Physical Exam  Nursing note and vitals reviewed. Constitutional: She is oriented to person, place, and time. She appears well-developed and well-nourished.  HENT:  Head: Normocephalic and atraumatic.  Eyes: Conjunctivae and EOM are normal. Pupils are equal, round, and reactive to light.  Neck: Normal range of motion. Neck supple.  Cardiovascular: Normal rate and regular rhythm.  Exam reveals no gallop and no friction rub.   No murmur heard. Pulmonary/Chest: Effort normal and breath sounds normal. No respiratory distress. She has no wheezes. She has no rales. She exhibits no tenderness.  Abdominal: Soft. She exhibits no distension and no mass. There is no  tenderness. There is no rebound and no guarding.  No  focal tenderness  Musculoskeletal: Normal range of motion. She exhibits no edema and no tenderness.  Neurological: She is alert and oriented to person, place, and time.  CN 3-12 intact, sensation and strength intact throughout.  Skin: Skin is warm and dry.  Psychiatric: She has a normal mood and affect. Her behavior is normal. Judgment and thought content normal.    ED Course  Procedures (including critical care time) Labs Review Results for orders placed during the hospital encounter of 10/17/13  CBC WITH DIFFERENTIAL      Result Value Range   WBC 10.1  4.0 - 10.5 K/uL   RBC 4.54  3.87 - 5.11 MIL/uL   Hemoglobin 13.8  12.0 - 15.0 g/dL   HCT 09.8  11.9 - 14.7 %   MCV 88.8  78.0 - 100.0 fL   MCH 30.4  26.0 - 34.0 pg   MCHC 34.2  30.0 - 36.0 g/dL   RDW 82.9  56.2 - 13.0 %   Platelets 214  150 - 400 K/uL   Neutrophils Relative % 74  43 - 77 %   Neutro Abs 7.5  1.7 - 7.7 K/uL   Lymphocytes Relative 19  12 - 46 %   Lymphs Abs 2.0  0.7 - 4.0 K/uL   Monocytes Relative 6  3 - 12 %   Monocytes Absolute 0.6  0.1 - 1.0 K/uL   Eosinophils Relative 1  0 - 5 %   Eosinophils Absolute 0.1  0.0 - 0.7 K/uL   Basophils Relative 0  0 - 1 %   Basophils Absolute 0.0  0.0 - 0.1 K/uL  BASIC METABOLIC PANEL      Result Value Range   Sodium 140  135 - 145 mEq/L   Potassium 4.0  3.5 - 5.1 mEq/L   Chloride 104  96 - 112 mEq/L   CO2 26  19 - 32 mEq/L   Glucose, Bld 114 (*) 70 - 99 mg/dL   BUN 18  6 - 23 mg/dL   Creatinine, Ser 8.65  0.50 - 1.10 mg/dL   Calcium 9.2  8.4 - 78.4 mg/dL   GFR calc non Af Amer 62 (*) >90 mL/min   GFR calc Af Amer 72 (*) >90 mL/min  URINALYSIS, ROUTINE W REFLEX MICROSCOPIC      Result Value Range   Color, Urine YELLOW  YELLOW   APPearance CLOUDY (*) CLEAR   Specific Gravity, Urine 1.017  1.005 - 1.030   pH 6.0  5.0 - 8.0   Glucose, UA NEGATIVE  NEGATIVE mg/dL   Hgb urine dipstick MODERATE (*) NEGATIVE   Bilirubin Urine NEGATIVE  NEGATIVE   Ketones, ur 15 (*)  NEGATIVE mg/dL   Protein, ur NEGATIVE  NEGATIVE mg/dL   Urobilinogen, UA 0.2  0.0 - 1.0 mg/dL   Nitrite POSITIVE (*) NEGATIVE   Leukocytes, UA LARGE (*) NEGATIVE  URINE MICROSCOPIC-ADD ON      Result Value Range   WBC, UA 21-50  <3 WBC/hpf   RBC / HPF 7-10  <3 RBC/hpf   Bacteria, UA MANY (*) RARE  POCT I-STAT TROPONIN I      Result Value Range   Troponin i, poc 0.00  0.00 - 0.08 ng/mL   Comment 3            Dg Chest Portable 1 View  10/17/2013   CLINICAL DATA:  Loss of consciousness, asthma  EXAM: PORTABLE CHEST -  1 VIEW  COMPARISON:  07/01/2013  FINDINGS: Lung volumes are at lower limits of normal, with curvilinear retrocardiac opacity, likely atelectasis. Right lung is clear. Cardiac leads obscure detail. Heart size is mildly enlarged without evidence for edema. No acute osseous finding.  IMPRESSION: Low volumes with presumed retrocardiac atelectasis or scarring. If symptoms persist, consider PA and lateral chest radiographs obtained at full inspiration when the patient is clinically able.   Electronically Signed   By: Christiana Pellant M.D.   On: 10/17/2013 11:51   Ct Angio Abd/pel W/ And/or W/o  10/17/2013   CLINICAL DATA:  Chest and abdominal pain. Nausea. Status post endovascular repair of abdominal aortic aneurysm. Clinical suspicion for aortic dissection.  EXAM: CT ANGIOGRAPHY CHEST, ABDOMEN AND PELVIS  TECHNIQUE: Multidetector CT imaging through the chest, abdomen and pelvis was performed using the standard protocol during bolus administration of intravenous contrast. Multiplanar reconstructed images including MIPs were obtained and reviewed to evaluate the vascular anatomy.  CONTRAST:  OMNIPAQUE IOHEXOL 350 MG/ML SOLN  COMPARISON:  Abdomen pelvis CTA on 08/06/2013  FINDINGS: CTA CHEST FINDINGS  No evidence of thoracic aortic dissection or aneurysm. No evidence of mediastinal hematoma or mass. Mild cardiomegaly noted. No lymphadenopathy identified within the thorax.  No evidence of  pleural or pericardial effusion. Mild parenchymal scarring noted bilaterally. No evidence of acute infiltrate or mass. No evidence of central endobronchial obstruction. No evidence of chest wall mass or suspicious bone lesions.  Review of the MIP images confirms the above findings.  CTA ABDOMEN AND PELVIS FINDINGS  Endovascular repair of infrarenal abdominal aortic aneurysm is stable in appearance. No evidence of endoleak or retroperitoneal hemorrhage. The excluded aneurysm sac currently measures 4.8 x 5.0 cm and is further decreased in size since previous study. No evidence of abdominal aortic dissection.  The abdominal parenchymal organs are normal in appearance except for several small renal cysts. Gallbladder is unremarkable. No evidence of hydronephrosis. No soft tissue masses or lymphadenopathy identified. Prior hysterectomy noted. Adnexal regions are unremarkable. Diverticulosis is seen involving the sigmoid colon, however there is no evidence of diverticulitis. No other inflammatory process or abnormal fluid collections identified. No suspicious bone lesions identified. Small paraumbilical ventral hernia containing only fat again noted.  Review of the MIP images confirms the above findings.  IMPRESSION: No evidence of thoracic aortic aneurysm or dissection.  Stable endovascular repair of infrarenal abdominal aortic aneurysm. No evidence of endoleak or other complication. No evidence of dissection or other acute findings.   Electronically Signed   By: Myles Rosenthal M.D.   On: 10/17/2013 14:15      EKG Interpretation   None       MDM   1. Syncope      Patient seen by and discussed with Dr. Criss Alvine.  Patient with syncope.  Labs are unremarkable.  Patient has UTI.  Will admit for syncope.  Patient discussed with Dr. Criselda Peaches, from [facility], who will admit.    Roxy Horseman, PA-C 10/17/13 1558

## 2013-10-17 NOTE — ED Notes (Signed)
LOC x 15-20 seconds, asystole on monitor, head down and pt woke up, nauseated and vomited.  PA/MD  made aware.

## 2013-10-17 NOTE — H&P (Signed)
[facility] History and Physical  Ashlee Reyes ZOX:[id number] DOB: [birth year] DOA: 10/17/2013  Referring physician: ED physician PCP: [name], MD   Chief Complaint: passing out  HPI:  Ashlee Reyes is a 79yo woman with PMH of CAD, AAA with repair July 2014, recurrent UTIs, GERD, hearing loss, IBS, interstitial cystits, DJD, OA who presents for syncope.  Her daughter Ashlee Reyes is in the room and fills in gaps in the history.  Per the patient and family, Ashlee Reyes was getting out of the shower today when Ashlee Reyes noted that Ashlee Reyes felt very tired, which is abnormal for her.  Her daughter then started to do her hair when Ashlee Reyes slumped over and was blacked out for about 90 seconds.  Her daughter held her up and Ashlee Reyes did not hit the floor.  Upon awakening from this episodes Ashlee Reyes felt nauseous and may have vomited.  They presented for further follow up. While in the ED Ashlee Reyes had another episode lasting approximately 15 - 20 seconds.  Associated symptoms have included nausea that has persisted today and urinary symptoms including painful urination a couple of times and increased urinary frequency over the past 24 hours.  Ashlee Reyes has had syncope before in the past when Ashlee Reyes had a bad ear infection.  Ashlee Reyes reports no history of heart disease or stroke, but has CAD as a problem on her PL.  Ashlee Reyes has had a CT of the head which showed no acute abnormalities but some chronic changes.  Ashlee Reyes also had a CT of her abdomen given her recent AAA surgery which was stable.    Assessment and Plan: Principal Problem: # Syncope - 2 Witnessed syncopal events.  No seizure like activity witnessed.  Likely etiologies include cardiogenic, neurocardiogenic or acute infection.  Ashlee Reyes has cause for cardiogenic given her prolonged PR interval and reported history of CAD.  EKG revealed a PR interval of 298 and Ashlee Reyes did have a 1 sec pause noted on telemetry so far.   Ashlee Reyes apparently has had 1st degree AV block as far back as 2010.   - Monitor on telemetry -  Check 2D echo (none in our system) - Ashlee Reyes is on the low end of normal for HR, if continued issues, ? Consider a PM - Ashlee Reyes further has an acute infection (UTI, see below), and Ashlee Reyes has had similar events with infections before.  Will treat as noted below.  - Cycle CE (1st negative) - Check TSH  Active Problems:  UTI (lower urinary tract infection) - UA with + nitrite and patient is symptomatic - Treat with Macrobid PO 100mg  BID - Ashlee Reyes is allergic to Augmentin, which cross reacts with cephalosporins and also allergic to avelox.  - Culture pending    C A D - Unclear history, Ashlee Reyes denies, reported as moderate on heart cath many years ago - Monitor on Tele - Cycle CE    REACTIVE AIRWAY DISEASE - Ashlee Reyes uses albuterol, ipratropium and QVAR, will continue    Abdominal aortic aneurysm s/p repair - Abdomen soft, CT appears stable.  Monitor closely for changes.     GERD - Continue PPI  DVT PPx: Lovenox and SCDs.   Code Status: DNR, confirmed with patient Family Communication: Pt and daughter Ashlee Reyes (cell phone [phone]) at bedside Disposition Plan: PT evaluation tomorrow    Review of Systems:  Constitutional: Negative for fever, chills and diaphoresis. Positive for malaise/fatigue today only HENT: Negative for hearing loss, ear pain, congestion, neck pain. Positive for occasional sore throat  Eyes: Negative for double vision, pain.  Positive for blurred vision once during this episode.  Respiratory: Negative for cough, shortness of breath, wheezing  Cardiovascular: Negative for chest pain, palpitations, claudication and leg swelling.  Ashlee Reyes does have some chronic leg pain, sounds like RLS Gastrointestinal: Positive for nausea, vomiting X1 and occasional constipation.  Negative for abdominal pain. Genitourinary: Positive for dysuria, urgency, frequency.  Negative for abdominal pain Musculoskeletal: Positive for some chronic leg pain, back pain.  Negative for falls.  Skin: Negative for itching  and rash.  Neurological: Negative for dizziness and weakness. Negative for tingling, tremors, sensory change, speech change, focal weakness. Positive for loss of consciousness and headaches.  Endo/Heme/Allergies:  Does not bruise/bleed easily.     Past Medical History  Diagnosis Date  . Osteoarthritis     spine/spinal stenosis  . GERD (gastroesophageal reflux disease)   . Hemorrhoids   . IC (irritable colon)     DMSO in past  . Allergy     allergic rhinitis  . IBS (irritable bowel syndrome)   . Diverticulosis   . CAD (coronary artery disease) 07/2009    mod by cath 08/10  . AAA (abdominal aortic aneurysm)     followed by Dr Darrick Penna. Last u/s07/10 stable AAA with largest measurement 3.97 x 4.02cm.  . Cancer     basal cell cancer - face.  . Osteopenia 2011  . Hyperlipidemia   . Depression   . Asthma   . Interstitial cystitis   . History of recurrent UTIs   . Disc disease, degenerative, lumbar or lumbosacral     SPINAL STENOSIS    Past Surgical History  Procedure Laterality Date  . Right colectomy  1997    benign  . Abdominal hysterectomy      partial not cancer  . Cystocele repair    . Rectocele repair    . Hernia repair  11/1997    umbilical hernia  . Esophagogastroduodenoscopy  09/1998    erosive esphagitis ? stricture treated 12/2002  . Refractive surgery      for film after cataract surgery.  . Skin lesion excision  05/2011    basal cell skin lesion removed.  . Breast surgery      lumpectomy  . Abdominal aortic endovascular stent graft N/A 07/01/2013    Procedure: ABDOMINAL AORTIC ENDOVASCULAR STENT GRAFT- GORE;  Surgeon: Sherren Kerns, MD;  Location: [facility] OR;  Service: Vascular;  Laterality: N/A;  Ultrasound guided    Social History:  reports that Ashlee Reyes has never smoked. Ashlee Reyes has never used smokeless tobacco. Ashlee Reyes reports that Ashlee Reyes does not drink alcohol or use illicit drugs.  History was confirmed with the patient  Allergies  Allergen Reactions  .  Amoxicillin-Pot Clavulanate     REACTION: sick  . Avelox [Moxifloxacin Hcl In Nacl] Nausea Only  . Ibuprofen     REACTION: GI  . Keflex [Cephalexin]     abd pain / GI upset  . Omeprazole Nausea Only  . Rosuvastatin     REACTION: foot pain  . Sertraline Hcl     REACTION: more depressed  . Tetanus Toxoids     Local redness and swelling     Family History  Problem Relation Age of Onset  . Heart disease Mother     deceased age 19 heart attack.  Marland Kitchen Heart attack Mother   . Other Mother     varicose veins  . Heart disease Father     age 49 heart attack  .  Cancer Father   . Stroke Brother   . Cancer Brother   . Heart disease Brother   . Heart attack Brother   . Cancer Daughter   . Diabetes Son     Prior to Admission medications   Medication Sig Start Date End Date Taking? Authorizing Provider  acetaminophen (TYLENOL) 500 MG tablet Take 500-1,000 mg by mouth every 4 (four) hours as needed. For pain   Yes Historical Provider, MD  albuterol (PROAIR HFA) 108 (90 BASE) MCG/ACT inhaler Inhale 2 puffs into the lungs every 4 (four) hours as needed for wheezing. 2 puffs up to every 4 hours as needed for wheezing 04/16/11  Yes Joaquim Nam, MD  aspirin 81 MG tablet Take 81 mg by mouth daily.   Yes Historical Provider, MD  beclomethasone (QVAR) 80 MCG/ACT inhaler Inhale 1 puff into the lungs 2 (two) times daily. 08/12/13  Yes Judy Pimple, MD  cetirizine (ZYRTEC) 10 MG tablet Take 10 mg by mouth daily as needed. For allergies   Yes Historical Provider, MD  esomeprazole (NEXIUM) 40 MG capsule Take 40 mg by mouth daily before breakfast.   Yes Historical Provider, MD  ipratropium (ATROVENT) 0.03 % nasal spray Place 2 sprays into the nose as needed.   Yes Historical Provider, MD  latanoprost (XALATAN) 0.005 % ophthalmic solution Place 1 drop into both eyes at bedtime.     Yes Historical Provider, MD  Polyethyl Glycol-Propyl Glycol (SYSTANE OP) Apply to eye as needed.   Yes Historical Provider,  MD  polyethylene glycol (MIRALAX / GLYCOLAX) packet Take 17 g by mouth daily as needed. For constipation   Yes Historical Provider, MD  Probiotic Product (HEALTHY COLON) CAPS Take 1 capsule by mouth daily as needed.    Yes Historical Provider, MD    Physical Exam: Filed Vitals:   10/17/13 1402 10/17/13 1430 10/17/13 1445 10/17/13 1500  BP: 149/55 136/51 118/51 117/49  Pulse: 67 80 72 77  Temp:      TempSrc:      Resp: 14 16 16 12   SpO2: 100% 100% 100% 100%    Physical Exam  Constitutional: Elderly woman, appears stated age, appears well-developed and well-nourished. No distress.  HENT: Normocephalic.  Oropharynx is clear and moist.  Eyes: Conjunctivae and EOM are normal. PERRLA, no scleral icterus.  Neck: Neck supple.  No thyromegaly or cervical adenopathy CVS: NR, RR, S1/S2 +, no murmurs  Pulmonary: Effort and breath sounds normal, no stridor, rhonchi, wheezes, rales.  Abdominal: Soft. BS +,  no distension, tenderness, rebound or guarding.  Musculoskeletal: No edema and no tenderness.  Lymphadenopathy: No lymphadenopathy noted, cervical. Neuro: Alert. Normal reflexes, muscle tone coordination. No cranial nerve deficit. Skin: Skin is warm and dry. No rash noted. Not diaphoretic. No erythema. No pallor.  Psychiatric: Normal mood and affect.    Labs on Admission:  Basic Metabolic Panel:  Recent Labs Lab 10/17/13 1134  NA 140  K 4.0  CL 104  CO2 26  GLUCOSE 114*  BUN 18  CREATININE 0.81  CALCIUM 9.2   CBC:  Recent Labs Lab 10/17/13 1134  WBC 10.1  NEUTROABS 7.5  HGB 13.8  HCT 40.3  MCV 88.8  PLT 214    Radiological Exams on Admission: Dg Chest Portable 1 View  10/17/2013   CLINICAL DATA:  Loss of consciousness, asthma  EXAM: PORTABLE CHEST - 1 VIEW  COMPARISON:  07/01/2013  FINDINGS: Lung volumes are at lower limits of normal, with curvilinear retrocardiac opacity, likely  atelectasis. Right lung is clear. Cardiac leads obscure detail. Heart size is mildly  enlarged without evidence for edema. No acute osseous finding.  IMPRESSION: Low volumes with presumed retrocardiac atelectasis or scarring. If symptoms persist, consider PA and lateral chest radiographs obtained at full inspiration when the patient is clinically able.   Electronically Signed   By: Reyes Pellant M.D.   On: 10/17/2013 11:51   Ct Angio Abd/pel W/ And/or W/o  10/17/2013   CLINICAL DATA:  Chest and abdominal pain. Nausea. Status post endovascular repair of abdominal aortic aneurysm. Clinical suspicion for aortic dissection.  EXAM: CT ANGIOGRAPHY CHEST, ABDOMEN AND PELVIS  TECHNIQUE: Multidetector CT imaging through the chest, abdomen and pelvis was performed using the standard protocol during bolus administration of intravenous contrast. Multiplanar reconstructed images including MIPs were obtained and reviewed to evaluate the vascular anatomy.  CONTRAST:  OMNIPAQUE IOHEXOL 350 MG/ML SOLN  COMPARISON:  Abdomen pelvis CTA on 08/06/2013  FINDINGS: CTA CHEST FINDINGS  No evidence of thoracic aortic dissection or aneurysm. No evidence of mediastinal hematoma or mass. Mild cardiomegaly noted. No lymphadenopathy identified within the thorax.  No evidence of pleural or pericardial effusion. Mild parenchymal scarring noted bilaterally. No evidence of acute infiltrate or mass. No evidence of central endobronchial obstruction. No evidence of chest wall mass or suspicious bone lesions.  Review of the MIP images confirms the above findings.  CTA ABDOMEN AND PELVIS FINDINGS  Endovascular repair of infrarenal abdominal aortic aneurysm is stable in appearance. No evidence of endoleak or retroperitoneal hemorrhage. The excluded aneurysm sac currently measures 4.8 x 5.0 cm and is further decreased in size since previous study. No evidence of abdominal aortic dissection.  The abdominal parenchymal organs are normal in appearance except for several small renal cysts. Gallbladder is unremarkable. No evidence of  hydronephrosis. No soft tissue masses or lymphadenopathy identified. Prior hysterectomy noted. Adnexal regions are unremarkable. Diverticulosis is seen involving the sigmoid colon, however there is no evidence of diverticulitis. No other inflammatory process or abnormal fluid collections identified. No suspicious bone lesions identified. Small paraumbilical ventral hernia containing only fat again noted.  Review of the MIP images confirms the above findings.  IMPRESSION: No evidence of thoracic aortic aneurysm or dissection.  Stable endovascular repair of infrarenal abdominal aortic aneurysm. No evidence of endoleak or other complication. No evidence of dissection or other acute findings.   Electronically Signed   By: Myles Rosenthal M.D.   On: 10/17/2013 14:15    EKG: Sinus Rhythm, prolonged PR interval at 298, normal QTc, Borderline LAD.  Reviewed old EKG from 06/11/13 which showed 1st degree AVB, PR interval of 260.  Noted a 1 sec pause on telemetry strip printed out as well.   Debe Coder, MD  [facility] Pager [phone]  If 7PM-7AM, please contact night-coverage [email or web address] Password [email or web address] 10/17/2013, 3:17 PM

## 2013-10-18 ENCOUNTER — Encounter (HOSPITAL_COMMUNITY): Payer: Self-pay | Admitting: Cardiology

## 2013-10-18 DIAGNOSIS — R55 Syncope and collapse: Secondary | ICD-10-CM | POA: Diagnosis not present

## 2013-10-18 DIAGNOSIS — I517 Cardiomegaly: Secondary | ICD-10-CM

## 2013-10-18 DIAGNOSIS — I441 Atrioventricular block, second degree: Secondary | ICD-10-CM | POA: Diagnosis present

## 2013-10-18 LAB — BASIC METABOLIC PANEL
BUN: 19 mg/dL (ref 6–23)
CO2: 25 mEq/L (ref 19–32)
Chloride: 108 mEq/L (ref 96–112)
Creatinine, Ser: 0.83 mg/dL (ref 0.50–1.10)
GFR calc Af Amer: 70 mL/min — ABNORMAL LOW (ref >90)
GFR calc non Af Amer: 60 mL/min — ABNORMAL LOW (ref >90)
Potassium: 4.1 mEq/L (ref 3.5–5.1)

## 2013-10-18 LAB — CBC
HCT: 38.3 % (ref 36.0–46.0)
Hemoglobin: 12.9 g/dL (ref 12.0–15.0)
MCHC: 33.7 g/dL (ref 30.0–36.0)
MCV: 87.6 fL (ref 78.0–100.0)
Platelets: 196 10*3/uL (ref 150–400)
RBC: 4.37 MIL/uL (ref 3.87–5.11)
RDW: 14.3 % (ref 11.5–15.5)
WBC: 7.2 10*3/uL (ref 4.0–10.5)

## 2013-10-18 LAB — TSH: TSH: 1.747 u[IU]/mL (ref 0.350–4.500)

## 2013-10-18 LAB — TROPONIN I: Troponin I: <0.3 ng/mL (ref <0.30)

## 2013-10-18 MED ORDER — NITROFURANTOIN MONOHYD MACRO 100 MG PO CAPS: 100.0000 mg | ORAL_CAPSULE | Freq: Two times a day (BID) | ORAL | Status: DC

## 2013-10-18 MED ORDER — ASPIRIN EC 81 MG PO TBEC
81.0000 mg | DELAYED_RELEASE_TABLET | Freq: Every day | ORAL | Status: DC
  Administered 2013-10-18 – 2013-10-19 (×2): 81 mg via ORAL
  Filled 2013-10-18 (×2): qty 1

## 2013-10-18 MED ORDER — ONDANSETRON HCL 4 MG PO TABS: 4.0000 mg | ORAL_TABLET | Freq: Four times a day (QID) | ORAL | Status: DC | PRN

## 2013-10-18 NOTE — Progress Notes (Signed)
Called and updated patients daughter [name] re: Cardiology recommendations.  [name] requested that D/C summary be sent to Dr. Mariah Milling in [facility] (Cardiology), phone number [phone].

## 2013-10-18 NOTE — Consult Note (Addendum)
Cardiology Consult Note  Admit date: 10/17/2013 Name: Ashlee Reyes 79 y.o.  female DOB:  1921/06/14 MRN:  [id number]  Today's date:  10/18/2013  Referring Physician:    [facility]  Primary Physician:    Dr. [name]   Reason for Consultation:    Syncope and abnormal cardiac rhythm  IMPRESSIONS: 1. Syncopal episode that could have been arrhythmic in origin but no definite bradycardia has been documented since admission 2. Coronary artery disease moderate 3. Peripheral vascular disease with previous abdominal aneurysm repair 4. Urinary tract infection 5. Hyperlipidemia under treatment 6. Depression 7. Hypertension 8. Second degree AV block in the presence of first-degree AV block with mostly Wenckebach rhythm noted  The patient had an episode of syncope that was witnessed. It could have been a rhythmic and arch and or could have been vasovagal. She does have evidence of urinary tract infection.  RECOMMENDATION: 1. Continued telemetry monitoring overnight. 2. If no further arrhythmias, may go home tomorrow but should then wear a 30 day event monitor to ensure that she is not having significant bradycardia that would need to have a pacemaker. The other possibility would be to consider an implantable loop recorder 3. Avoidance of any AV nodal blocking agents. 4. Check orthostatic blood pressures.  HISTORY: This very nice 79 year old female has a history of moderate coronary artery disease at catheterization in 2010. She also had an abdominal aneurysm that was for appeared with endovascular stenting earlier this year. 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 She had taken a shower yesterday and when getting out of the shower did not feel quite well and was sitting down when her daughter was getting ready to roll her here. She then had a syncopal episode where she slumped forward. Her daughter called her and noted  that she was out for maybe 90 seconds. She was somewhat nauseated when she came to. She did not have any seizure activity or incontinence. She did not have any chest pain. She had an episode of syncope several years ago but normally gets along fine and doesn't have much in the way of dizziness. She has no PND, orthopnea, claudication, or significant palpitations.  Past Medical History  Diagnosis Date  . Osteoarthritis     spine/spinal stenosis  . GERD (gastroesophageal reflux disease)   . Hemorrhoids   . IC (irritable colon)     DMSO in past  . Allergic rhinitis, seasonal   . IBS (irritable bowel syndrome)   . Diverticulosis   . CAD (coronary artery disease) 07/2009    mod by cath 08/10  . AAA (abdominal aortic aneurysm)     followed by Dr Darrick Penna. Last u/s07/10 stable AAA with largest measurement 3.97 x 4.02cm.  . Skin cancer     basal cell cancer - face.  . Osteopenia 2011  . Hyperlipidemia   . Depression   . Asthma   . Interstitial cystitis   . History of recurrent UTIs   . Disc disease, degenerative, lumbar or lumbosacral     SPINAL STENOSIS      Past Surgical History  Procedure Laterality Date  . Right colectomy  1997    benign  . Abdominal hysterectomy      partial not cancer  . Cystocele repair    . Rectocele repair    . Hernia repair  11/1997    umbilical hernia  . Esophagogastroduodenoscopy  09/1998  erosive esphagitis ? stricture treated 12/2002  . Refractive surgery      for film after cataract surgery.  . Skin lesion excision  05/2011    basal cell skin lesion removed.  . Breast surgery      lumpectomy  . Abdominal aortic endovascular stent graft N/A 07/01/2013    Procedure: ABDOMINAL AORTIC ENDOVASCULAR STENT GRAFT- GORE;  Surgeon: Sherren Kerns, MD;  Location: [facility] OR;  Service: Vascular;  Laterality: N/A;  Ultrasound guided     Allergies:  is allergic to amoxicillin-pot clavulanate; avelox; ibuprofen; keflex; omeprazole; rosuvastatin; sertraline hcl;  and tetanus toxoids.   Medications: Prior to Admission medications   Medication Sig Start Date End Date Taking? Authorizing Provider  acetaminophen (TYLENOL) 500 MG tablet Take 500-1,000 mg by mouth every 4 (four) hours as needed. For pain   Yes Historical Provider, MD  albuterol (PROAIR HFA) 108 (90 BASE) MCG/ACT inhaler Inhale 2 puffs into the lungs every 4 (four) hours as needed for wheezing. 2 puffs up to every 4 hours as needed for wheezing 04/16/11  Yes Joaquim Nam, MD  aspirin 81 MG tablet Take 81 mg by mouth daily.   Yes Historical Provider, MD  beclomethasone (QVAR) 80 MCG/ACT inhaler Inhale 1 puff into the lungs 2 (two) times daily. 08/12/13  Yes Judy Pimple, MD  cetirizine (ZYRTEC) 10 MG tablet Take 10 mg by mouth daily as needed. For allergies   Yes Historical Provider, MD  esomeprazole (NEXIUM) 40 MG capsule Take 40 mg by mouth daily before breakfast.   Yes Historical Provider, MD  ipratropium (ATROVENT) 0.03 % nasal spray Place 2 sprays into the nose as needed.   Yes Historical Provider, MD  latanoprost (XALATAN) 0.005 % ophthalmic solution Place 1 drop into both eyes at bedtime.     Yes Historical Provider, MD  Polyethyl Glycol-Propyl Glycol (SYSTANE OP) Apply to eye as needed.   Yes Historical Provider, MD  polyethylene glycol (MIRALAX / GLYCOLAX) packet Take 17 g by mouth daily as needed. For constipation   Yes Historical Provider, MD  Probiotic Product (HEALTHY COLON) CAPS Take 1 capsule by mouth daily as needed.    Yes Historical Provider, MD    Family History: Family Status  Relation Status Death Age  . Mother Other     heart attack  . Father Deceased     Myocardial infarction  . Brother Deceased     heart attack  . Sister Deceased     Myocardial infarction    Social History:   reports that she has never smoked. She has never used smokeless tobacco. She reports that she does not drink alcohol or use illicit drugs.   History   Social History Narrative  . No  narrative on file    Review of Systems: She has very mild dyspnea with exertion but can normally do her activities. She currently complains of some lower abdominal pain and attributes it to some antibiotics that she is currently taking.. She also had urinary frequency prior to the admission. She complains of arthritis involves her hands and also has significant low back pain as well as spinal stenosis. Other than as noted above the remainder of the review of systems is unremarkable.  Physical Exam: BP 124/57  Pulse 63  Temp(Src) 98.5 F (36.9 C) (Oral)  Resp 16  Ht 5\' 6"  (1.676 m)  Wt 72.576 kg (160 lb)  BMI 25.84 kg/m2  SpO2 98%  LMP 12/17/1968  General appearance: Pleasant elderly white  female in no acute distress Head: Normocephalic, without obvious abnormality, atraumatic Eyes: conjunctivae/corneas clear. PERRL, EOM's intact. Fundi not examined  Neck: no adenopathy, no carotid bruit, no JVD and supple, symmetrical, trachea midline Lungs: clear to auscultation bilaterally Heart: regular rate and rhythm, S1, S2 normal, no murmur, click, rub or gallop Abdomen: soft, non-tender; bowel sounds normal; no masses,  no organomegaly Pelvic: deferred Extremities: Bilateral venous insufficiency changes are noted, no edema Pulses: 2+ and symmetric Skin: Skin color, texture, turgor normal. No rashes or lesions Neurologic: Grossly normal  Labs: CBC  Recent Labs  10/17/13 1134 10/18/13 0505  WBC 10.1 7.2  RBC 4.54 4.37  HGB 13.8 12.9  HCT 40.3 38.3  PLT 214 196  MCV 88.8 87.6  MCH 30.4 29.5  MCHC 34.2 33.7  RDW 14.4 14.3  LYMPHSABS 2.0  --   MONOABS 0.6  --   EOSABS 0.1  --   BASOSABS 0.0  --    CMP   Recent Labs  10/18/13 0505  NA 141  K 4.1  CL 108  CO2 25  GLUCOSE 95  BUN 19  CREATININE 0.83  CALCIUM 9.1  GFRNONAA 60*  GFRAA 70*   Cardiac Panel (last 3 results)  Recent Labs  10/17/13 1818 10/18/13 0505  TROPONINI <0.30 <0.30     Radiology: Low lung  volumes no acute disease  EKG: Normal sinus rhythm with marked first degree AV block, review of telemetry shows episodes of Wenckebach behavior as well as some occasional accelerated junctional rhythm. No severe bradycardia noted.  Signed:  Darden Palmer MD [facility]   Cardiology Consultant  10/18/2013, 2:23 PM

## 2013-10-18 NOTE — Progress Notes (Signed)
Pt reports feels constipated had sm BM this AM reprted it was black with a little blood. Instructed to save next BM for nurse to see. Prune juice given as per pt. Request.

## 2013-10-18 NOTE — Progress Notes (Signed)
  Echocardiogram 2D Echocardiogram has been performed.  Ashlee Reyes 10/18/2013, 10:31 AM

## 2013-10-18 NOTE — Progress Notes (Signed)
[facility] PROGRESS NOTE  PHOENICIA PIRIE ZOX:[id number] DOB: February 18, 1921 DOA: 10/17/2013 PCP: [name], MD  Brief narrative: Ashlee Reyes is a 79 yo woman with PMH of CAD, AAA repair in July 2014, recurrent UTIs, GERD who presents with a new UTI and syncope X 2.  EKG with prolonged PR interval.   Principal Problem:  Syncope - Review of telemetry overnight revealed dropped beats with PR interval of 250, fixed, concerning for 2nd degree AV block, mobitz type 2.   - Cardiology will be consulted.  - TSH pending - CE X 3 negative for any acute event - No further episodes of syncope - TTE pending  Active Problems:  UTI (lower urinary tract infection) - UC pending - On Macrobid 100mg  BID - Symptoms improved  Chronic constipation - Improved this AM with prune juice  C A D - Monitor on telemetry - noted above - No chest pain - CE X 3 negative  REACTIVE AIRWAY DISEASE - Continuing home medications, no breathing issues to date  Abdominal aortic aneurysm - Abdomen soft, non tender  DVT PPx: Lovenox and SCDs   Consultants:  Cardiology today  Procedures/Studies: Ct Head Wo Contrast  10/17/2013   CLINICAL DATA:  Initial encounter for syncopal episode earlier today. Patient felt tired and we after getting out of the shower earlier this morning and a and lost consciousness for approximately 90 sec. Thereafter the patient complained of nausea and headache.  EXAM: CT HEAD WITHOUT CONTRAST  TECHNIQUE: Contiguous axial images were obtained from the base of the skull through the vertex without intravenous contrast.  COMPARISON:  MRI brain 12/02/2012.  FINDINGS: Ventricular system normal in size and appearance for age. Moderate cortical atrophy, moderate deep atrophy, and severe cerebellar atrophy. Moderate changes of small vessel disease of the white matter diffusely. More focal lacunar strokes in the basal ganglia bilaterally. Slightly hyperdense caudate nuclei and lenticular nuclei, of  low signal on T2 weighted imaging on the prior MRI, consistent with expected mineral deposition related to age. Physiologic calcification the left basal ganglia. No mass lesion. No midline shift. No acute hemorrhage or hematoma. No extra-axial fluid collections. No evidence of acute infarction.  No focal osseous abnormality involving the skull. Visualized paranasal sinuses, bilateral mastoid air cells, and bilateral middle ear cavities well-aerated. Extensive bilateral carotid siphon and bilateral vertebral artery atherosclerosis. Calcified 8 mm meningioma arising from the left frontal bone near the vertex and calcified 5 mm meningioma arising from the right frontal bone near the vertex, unchanged.  IMPRESSION: 1. No acute intracranial abnormality. 2. Moderate to severe generalized atrophy and moderate chronic microvascular ischemic changes of the white matter with focal lacunar strokes in the basal ganglia bilaterally. 3. Incidental subcentimeter bilateral frontal calcified meningiomas as noted previously.   Electronically Signed   By: Hulan Saas M.D.   On: 10/17/2013 14:21   Dg Chest Portable 1 View  10/17/2013   CLINICAL DATA:  Loss of consciousness, asthma  EXAM: PORTABLE CHEST - 1 VIEW  COMPARISON:  07/01/2013  FINDINGS: Lung volumes are at lower limits of normal, with curvilinear retrocardiac opacity, likely atelectasis. Right lung is clear. Cardiac leads obscure detail. Heart size is mildly enlarged without evidence for edema. No acute osseous finding.  IMPRESSION: Low volumes with presumed retrocardiac atelectasis or scarring. If symptoms persist, consider PA and lateral chest radiographs obtained at full inspiration when the patient is clinically able.   Electronically Signed   By: Christiana Pellant M.D.   On: 10/17/2013 11:51  Ct Angio Chest Aorta W/cm &/or Wo/cm  10/17/2013   CLINICAL DATA:  Chest and abdominal pain. Nausea. Status post endovascular repair of abdominal aortic aneurysm. Clinical  suspicion for aortic dissection.  EXAM: CT ANGIOGRAPHY CHEST, ABDOMEN AND PELVIS  TECHNIQUE: Multidetector CT imaging through the chest, abdomen and pelvis was performed using the standard protocol during bolus administration of intravenous contrast. Multiplanar reconstructed images including MIPs were obtained and reviewed to evaluate the vascular anatomy.  CONTRAST:  OMNIPAQUE IOHEXOL 350 MG/ML SOLN  COMPARISON:  Abdomen pelvis CTA on 08/06/2013  FINDINGS: CTA CHEST FINDINGS  No evidence of thoracic aortic dissection or aneurysm. No evidence of mediastinal hematoma or mass. Mild cardiomegaly noted. No lymphadenopathy identified within the thorax.  No evidence of pleural or pericardial effusion. Mild parenchymal scarring noted bilaterally. No evidence of acute infiltrate or mass. No evidence of central endobronchial obstruction. No evidence of chest wall mass or suspicious bone lesions.  Review of the MIP images confirms the above findings.  CTA ABDOMEN AND PELVIS FINDINGS  Endovascular repair of infrarenal abdominal aortic aneurysm is stable in appearance. No evidence of endoleak or retroperitoneal hemorrhage. The excluded aneurysm sac currently measures 4.8 x 5.0 cm and is further decreased in size since previous study. No evidence of abdominal aortic dissection.  The abdominal parenchymal organs are normal in appearance except for several small renal cysts. Gallbladder is unremarkable. No evidence of hydronephrosis. No soft tissue masses or lymphadenopathy identified. Prior hysterectomy noted. Adnexal regions are unremarkable. Diverticulosis is seen involving the sigmoid colon, however there is no evidence of diverticulitis. No other inflammatory process or abnormal fluid collections identified. No suspicious bone lesions identified. Small paraumbilical ventral hernia containing only fat again noted.  Review of the MIP images confirms the above findings.  IMPRESSION: No evidence of thoracic aortic aneurysm  or dissection.  Stable endovascular repair of infrarenal abdominal aortic aneurysm. No evidence of endoleak or other complication. No evidence of dissection or other acute findings.   Electronically Signed   By: Myles Rosenthal M.D.   On: 10/17/2013 14:15   Ct Angio Abd/pel W/ And/or W/o  10/17/2013   CLINICAL DATA:  Chest and abdominal pain. Nausea. Status post endovascular repair of abdominal aortic aneurysm. Clinical suspicion for aortic dissection.  EXAM: CT ANGIOGRAPHY CHEST, ABDOMEN AND PELVIS  TECHNIQUE: Multidetector CT imaging through the chest, abdomen and pelvis was performed using the standard protocol during bolus administration of intravenous contrast. Multiplanar reconstructed images including MIPs were obtained and reviewed to evaluate the vascular anatomy.  CONTRAST:  OMNIPAQUE IOHEXOL 350 MG/ML SOLN  COMPARISON:  Abdomen pelvis CTA on 08/06/2013  FINDINGS: CTA CHEST FINDINGS  No evidence of thoracic aortic dissection or aneurysm. No evidence of mediastinal hematoma or mass. Mild cardiomegaly noted. No lymphadenopathy identified within the thorax.  No evidence of pleural or pericardial effusion. Mild parenchymal scarring noted bilaterally. No evidence of acute infiltrate or mass. No evidence of central endobronchial obstruction. No evidence of chest wall mass or suspicious bone lesions.  Review of the MIP images confirms the above findings.  CTA ABDOMEN AND PELVIS FINDINGS  Endovascular repair of infrarenal abdominal aortic aneurysm is stable in appearance. No evidence of endoleak or retroperitoneal hemorrhage. The excluded aneurysm sac currently measures 4.8 x 5.0 cm and is further decreased in size since previous study. No evidence of abdominal aortic dissection.  The abdominal parenchymal organs are normal in appearance except for several small renal cysts. Gallbladder is unremarkable. No evidence of hydronephrosis. No soft  tissue masses or lymphadenopathy identified. Prior hysterectomy  noted. Adnexal regions are unremarkable. Diverticulosis is seen involving the sigmoid colon, however there is no evidence of diverticulitis. No other inflammatory process or abnormal fluid collections identified. No suspicious bone lesions identified. Small paraumbilical ventral hernia containing only fat again noted.  Review of the MIP images confirms the above findings.  IMPRESSION: No evidence of thoracic aortic aneurysm or dissection.  Stable endovascular repair of infrarenal abdominal aortic aneurysm. No evidence of endoleak or other complication. No evidence of dissection or other acute findings.   Electronically Signed   By: Myles Rosenthal M.D.   On: 10/17/2013 14:15       Antibiotics:  Macrobid 11/1 --> current  Code Status: DNR Family Communication: Pt at bedside, will attempt to call daughter Ashlee Reyes to update today.  daughter Ashlee Reyes (cell phone [phone]) Disposition Plan: Home when medically stable  HPI/Subjective: No events overnight.   Objective: Filed Vitals:   10/17/13 1707 10/17/13 1710 10/17/13 2100 10/18/13 0500  BP: 133/70 130/74 122/56 124/57  Pulse: 75 71 72 63  Temp:   97.8 F (36.6 C) 98.5 F (36.9 C)  TempSrc:      Resp:   16 16  SpO2: 96% 95% 97% 96%    Intake/Output Summary (Last 24 hours) at 10/18/13 0803 Last data filed at 10/18/13 0125  Gross per 24 hour  Intake      0 ml  Output    700 ml  Net   -700 ml    Exam:   General:  Elderly woman, sitting at bedside, NAD  Cardiovascular: RR, NR, S1/S2, no murmurs noted  Respiratory: Clear to auscultation bilaterally, no wheezing, rales or rhonchi  Abdomen: Soft, non tender, bowel sounds present  Extremities: No edema, warm and dry, no wounds  Neuro: Grossly nonfocal  Data Reviewed: Basic Metabolic Panel:  Recent Labs Lab 10/17/13 1134 10/18/13 0505  NA 140 141  K 4.0 4.1  CL 104 108  CO2 26 25  GLUCOSE 114* 95  BUN 18 19  CREATININE 0.81 0.83  CALCIUM 9.2 9.1   CBC:  Recent  Labs Lab 10/17/13 1134 10/18/13 0505  WBC 10.1 7.2  NEUTROABS 7.5  --   HGB 13.8 12.9  HCT 40.3 38.3  MCV 88.8 87.6  PLT 214 196   Cardiac Enzymes:  Recent Labs Lab 10/17/13 1818 10/18/13 0505  TROPONINI <0.30 <0.30     Scheduled Meds: . aspirin  81 mg Oral Daily  . enoxaparin (LOVENOX) injection  40 mg Subcutaneous Q24H  . fluticasone  1 puff Inhalation BID  . latanoprost  1 drop Both Eyes QHS  . loratadine  10 mg Oral Daily  . nitrofurantoin (macrocrystal-monohydrate)  100 mg Oral Q12H  . pantoprazole  80 mg Oral Q1200  . sodium chloride  3 mL Intravenous Q12H  . sodium chloride  3 mL Intravenous Q12H   Continuous Infusions:    Debe Coder, MD  [facility] Pager [phone]  If 7PM-7AM, please contact night-coverage [email or web address] Password [email or web address] 10/18/2013, 8:03 AM   LOS: 1 day

## 2013-10-18 NOTE — Progress Notes (Signed)
Utilization Review Completed.Ashlee Reyes T11/01/2013  

## 2013-10-19 DIAGNOSIS — R55 Syncope and collapse: Secondary | ICD-10-CM | POA: Diagnosis not present

## 2013-10-19 DIAGNOSIS — I2510 Atherosclerotic heart disease of native coronary artery without angina pectoris: Secondary | ICD-10-CM | POA: Diagnosis not present

## 2013-10-19 DIAGNOSIS — N390 Urinary tract infection, site not specified: Secondary | ICD-10-CM

## 2013-10-19 LAB — URINE CULTURE

## 2013-10-19 MED ORDER — LEVOFLOXACIN 750 MG PO TABS: 750.0000 mg | ORAL_TABLET | Freq: Every day | ORAL | Status: DC

## 2013-10-19 NOTE — Discharge Instructions (Addendum)
Urinary Tract Infection Urinary tract infections (UTIs) can develop anywhere along your urinary tract. Your urinary tract is your body's drainage system for removing wastes and extra water. Your urinary tract includes two kidneys, two ureters, a bladder, and a urethra. Your kidneys are a pair of bean-shaped organs. Each kidney is about the size of your fist. They are located below your ribs, one on each side of your spine. CAUSES Infections are caused by microbes, which are microscopic organisms, including fungi, viruses, and bacteria. These organisms are so small that they can only be seen through a microscope. Bacteria are the microbes that most commonly cause UTIs. SYMPTOMS  Symptoms of UTIs may vary by age and gender of the patient and by the location of the infection. Symptoms in young women typically include a frequent and intense urge to urinate and a painful, burning feeling in the bladder or urethra during urination. Older women and men are more likely to be tired, shaky, and weak and have muscle aches and abdominal pain. A fever may mean the infection is in your kidneys. Other symptoms of a kidney infection include pain in your back or sides below the ribs, nausea, and vomiting. DIAGNOSIS To diagnose a UTI, your caregiver will ask you about your symptoms. Your caregiver also will ask to provide a urine sample. The urine sample will be tested for bacteria and white blood cells. White blood cells are made by your body to help fight infection. TREATMENT  Typically, UTIs can be treated with medication. Because most UTIs are caused by a bacterial infection, they usually can be treated with the use of antibiotics. The choice of antibiotic and length of treatment depend on your symptoms and the type of bacteria causing your infection. HOME CARE INSTRUCTIONS  If you were prescribed antibiotics, take them exactly as your caregiver instructs you. Finish the medication even if you feel better after you  have only taken some of the medication.  Drink enough water and fluids to keep your urine clear or pale yellow.  Avoid caffeine, tea, and carbonated beverages. They tend to irritate your bladder.  Empty your bladder often. Avoid holding urine for long periods of time.  Empty your bladder before and after sexual intercourse.  After a bowel movement, women should cleanse from front to back. Use each tissue only once. SEEK MEDICAL CARE IF:   You have back pain.  You develop a fever.  Your symptoms do not begin to resolve within 3 days. SEEK IMMEDIATE MEDICAL CARE IF:   You have severe back pain or lower abdominal pain.  You develop chills.  You have nausea or vomiting.  You have continued burning or discomfort with urination. MAKE SURE YOU:   Understand these instructions.  Will watch your condition.  Will get help right away if you are not doing well or get worse. Document Released: 09/12/2005 Document Revised: 06/03/2012 Document Reviewed: 01/11/2012 [facility] Patient Information 2014 [place], [place].   Cardiac Diet This diet can help prevent heart disease and stroke. Many factors influence your heart health, including eating and exercise habits. Coronary risk rises a lot with abnormal blood fat (lipid) levels. Cardiac meal planning includes limiting unhealthy fats, increasing healthy fats, and making other small dietary changes. General guidelines are as follows:  Adjust calorie intake to reach and maintain desirable body weight.  Limit total fat intake to less than 30% of total calories. Saturated fat should be less than 7% of calories.  Saturated fats are found in animal products  and in some vegetable products. Saturated vegetable fats are found in coconut oil, cocoa butter, palm oil, and palm kernel oil. Read labels carefully to avoid these products as much as possible. Use butter in moderation. Choose tub margarines and oils that have 2 grams of fat or less. Good  cooking oils are canola and olive oils.  Practice low-fat cooking techniques. Do not fry food. Instead, broil, bake, boil, steam, grill, roast on a rack, stir-fry, or microwave it. Other fat reducing suggestions include:  Remove the skin from poultry.  Remove all visible fat from meats.  Skim the fat off stews, soups, and gravies before serving them.  Steam vegetables in water or broth instead of sauting them in fat.  Avoid foods with trans fat (or hydrogenated oils), such as commercially fried foods and commercially baked goods. Commercial shortening and deep-frying fats will contain trans fat.  Increase intake of fruits, vegetables, whole grains, and legumes to replace foods high in fat.  Increase consumption of nuts, legumes, and seeds to at least 4 servings weekly. One serving of a legume equals  cup, and 1 serving of nuts or seeds equals  cup.  Choose whole grains more often. Have 3 servings per day (a serving is 1 ounce [oz]).  Eat 4 to 5 servings of vegetables per day. A serving of vegetables is 1 cup of raw leafy vegetables;  cup of raw or cooked cut-up vegetables;  cup of vegetable juice.  Eat 4 to 5 servings of fruit per day. A serving of fruit is 1 medium whole fruit;  cup of dried fruit;  cup of fresh, frozen, or canned fruit;  cup of 100% fruit juice.  Increase your intake of dietary fiber to 20 to 30 grams per day. Insoluble fiber may help lower your risk of heart disease and may help curb your appetite.  Soluble fiber binds cholesterol to be removed from the blood. Foods high in soluble fiber are dried beans, citrus fruits, oats, apples, bananas, broccoli, Brussels sprouts, and eggplant.  Try to include foods fortified with plant sterols or stanols, such as yogurt, breads, juices, or margarines. Choose several fortified foods to achieve a daily intake of 2 to 3 grams of plant sterols or stanols.  Foods with omega-3 fats can help reduce your risk of heart disease.  Aim to have a 3.5 oz portion of fatty fish twice per week, such as salmon, mackerel, albacore tuna, sardines, lake trout, or herring. If you wish to take a fish oil supplement, choose one that contains 1 gram of both DHA and EPA.  Limit processed meats to 2 servings (3 oz portion) weekly.  Limit the sodium in your diet to 1500 milligrams (mg) per day. If you have high blood pressure, talk to a registered dietitian about a DASH (Dietary Approaches to Stop Hypertension) eating plan.  Limit sweets and beverages with added sugar, such as soda, to no more than 5 servings per week. One serving is:   1 tablespoon sugar.  1 tablespoon jelly or jam.   cup sorbet.  1 cup lemonade.   cup regular soda. CHOOSING FOODS Starches  Allowed: Breads: All kinds (wheat, rye, raisin, white, oatmeal, [place], [place], and English muffin bread). Low-fat rolls: English muffins, frankfurter and hamburger buns, bagels, pita bread, tortillas (not fried). Pancakes, waffles, biscuits, and muffins made with recommended oil.  Avoid: Products made with saturated or trans fats, oils, or whole milk products. Butter rolls, cheese breads, croissants. Commercial doughnuts, muffins, sweet rolls, biscuits,  waffles, pancakes, store-bought mixes. Crackers  Allowed: Low-fat crackers and snacks: Animal, graham, rye, saltine (with recommended oil, no lard), oyster, and matzo crackers. Bread sticks, melba toast, rusks, flatbread, pretzels, and light popcorn.  Avoid: High-fat crackers: cheese crackers, butter crackers, and those made with coconut, palm oil, or trans fat (hydrogenated oils). Buttered popcorn. Cereals  Allowed: Hot or cold whole-grain cereals.  Avoid: Cereals containing coconut, hydrogenated vegetable fat, or animal fat. Potatoes / Pasta / Rice  Allowed: All kinds of potatoes, rice, and pasta (such as macaroni, spaghetti, and noodles).  Avoid: Pasta or rice prepared with cream sauce or high-fat cheese. Chow  mein noodles, [place] fries. Vegetables  Allowed: All vegetables and vegetable juices.  Avoid: Fried vegetables. Vegetables in cream, butter, or high-fat cheese sauces. Limit coconut. Fruit in cream or custard. Protein  Allowed: Limit your intake of meat, seafood, and poultry to no more than 6 oz (cooked weight) per day. All lean, well-trimmed beef, veal, pork, and lamb. All chicken and [place] without skin. All fish and shellfish. Wild game: wild duck, rabbit, pheasant, and venison. Egg whites or low-cholesterol egg substitutes may be used as desired. Meatless dishes: recipes with dried beans, peas, lentils, and tofu (soybean curd). Seeds and nuts: all seeds and most nuts.  Avoid: Prime grade and other heavily marbled and fatty meats, such as short ribs, spare ribs, rib eye roast or steak, frankfurters, sausage, bacon, and high-fat luncheon meats, mutton. Caviar. Commercially fried fish. Domestic duck, goose, venison sausage. Organ meats: liver, gizzard, heart, chitterlings, brains, kidney, sweetbreads. Dairy  Allowed: Low-fat cheeses: nonfat or low-fat cottage cheese (1% or 2% fat), cheeses made with part skim milk, such as mozzarella, farmers, string, or ricotta. (Cheeses should be labeled no more than 2 to 6 grams fat per oz.). Skim (or 1%) milk: liquid, powdered, or evaporated. Buttermilk made with low-fat milk. Drinks made with skim or low-fat milk or cocoa. Chocolate milk or cocoa made with skim or low-fat (1%) milk. Nonfat or low-fat yogurt.  Avoid: Whole milk cheeses, including colby, cheddar, [place], [address], [place], [place], [place], [address], [place], and blue. Creamed cottage cheese, cream cheese. Whole milk and whole milk products, including buttermilk or yogurt made from whole milk, drinks made from whole milk. Condensed milk, evaporated whole milk, and 2% milk. Soups and Combination Foods  Allowed: Low-fat low-sodium soups: broth, dehydrated soups, homemade broth, soups with the  fat removed, homemade cream soups made with skim or low-fat milk. Low-fat spaghetti, lasagna, chili, and Spanish rice if low-fat ingredients and low-fat cooking techniques are used.  Avoid: Cream soups made with whole milk, cream, or high-fat cheese. All other soups. Desserts and Sweets  Allowed: Sherbet, fruit ices, gelatins, meringues, and angel food cake. Homemade desserts with recommended fats, oils, and milk products. Jam, jelly, honey, marmalade, sugars, and syrups. Pure sugar candy, such as gum drops, hard candy, jelly beans, marshmallows, mints, and small amounts of dark chocolate.  Avoid: Commercially prepared cakes, pies, cookies, frosting, pudding, or mixes for these products. Desserts containing whole milk products, chocolate, coconut, lard, palm oil, or palm kernel oil. Ice cream or ice cream drinks. Candy that contains chocolate, coconut, butter, hydrogenated fat, or unknown ingredients. Buttered syrups. Fats and Oils  Allowed: Vegetable oils: safflower, sunflower, corn, soybean, cottonseed, sesame, canola, olive, or peanut. Non-hydrogenated margarines. Salad dressing or mayonnaise: homemade or commercial, made with a recommended oil. Low or nonfat salad dressing or mayonnaise.  Limit added fats and oils to 6 to 8 tsp per  day (includes fats used in cooking, baking, salads, and spreads on bread). Remember to count the "hidden fats" in foods.  Avoid: Solid fats and shortenings: butter, lard, salt pork, bacon drippings. Gravy containing meat fat, shortening, or suet. Cocoa butter, coconut. Coconut oil, palm oil, palm kernel oil, or hydrogenated oils: these ingredients are often used in bakery products, nondairy creamers, whipped toppings, candy, and commercially fried foods. Read labels carefully. Salad dressings made of unknown oils, sour cream, or cheese, such as blue cheese and Roquefort. Cream, all kinds: half-and-half, light, heavy, or whipping. Sour cream or cream cheese (even if  "light" or low-fat). Nondairy cream substitutes: coffee creamers and sour cream substitutes made with palm, palm kernel, hydrogenated oils, or coconut oil. Beverages  Allowed: Coffee (regular or decaffeinated), tea. Diet carbonated beverages, mineral water. Alcohol: Check with your caregiver. Moderation is recommended.  Avoid: Whole milk, regular sodas, and juice drinks with added sugar. Condiments  Allowed: All seasonings and condiments. Cocoa powder. "Cream" sauces made with recommended ingredients.  Avoid: Carob powder made with hydrogenated fats. SAMPLE MENU Breakfast   cup orange juice   cup oatmeal  1 slice toast  1 tsp margarine  1 cup skim milk Lunch  [place] sandwich with 2 oz [place], 2 slices bread  Lettuce and tomato slices  Fresh fruit  Carrot sticks  Coffee or tea Snack  Fresh fruit or low-fat crackers Dinner  3 oz lean ground beef  1 baked potato  1 tsp margarine   cup asparagus  Lettuce salad  1 tbs non-creamy dressing   cup peach slices  1 cup skim milk Document Released: 09/11/2008 Document Revised: 06/03/2012 Document Reviewed: 02/26/2012 [employer] Patient Information 2014 [place], [place].

## 2013-10-19 NOTE — Progress Notes (Signed)
PROGRESS NOTE  Subjective:   Ashlee Reyes is a 79 yo with hx of CAD and AAA repair.   She has seen Dr. Mariah Milling earlier this year. She was admitted with an episode of syncope in the setting of a UTI.  Her tele has been stable.  No further episodes of syncope.    Objective:    Vital Signs:   Temp:  [97.7 F (36.5 C)-98 F (36.7 C)] 98 F (36.7 C) (11/03 0500) Pulse Rate:  [63-83] 83 (11/03 0506) Resp:  [16-18] 18 (11/03 0500) BP: (97-162)/(47-74) 97/48 mmHg (11/03 0506) SpO2:  [95 %-98 %] 95 % (11/03 0500) Weight:  [160 lb (72.576 kg)] 160 lb (72.576 kg) (11/02 0841)  Last BM Date: 10/18/13   24-hour weight change: Weight change:   Weight trends: Filed Weights   10/18/13 0841  Weight: 160 lb (72.576 kg)    Intake/Output:  11/02 0701 - 11/03 0700 In: 606 [P.O.:600; I.V.:6] Out: 700 [Urine:700]     Physical Exam: BP 97/48  Pulse 83  Temp(Src) 98 F (36.7 C) (Oral)  Resp 18  Ht 5\' 6"  (1.676 m)  Wt 160 lb (72.576 kg)  BMI 25.84 kg/m2  SpO2 95%  LMP 12/17/1968  General: Vital signs reviewed and noted.  Very healthy 79 yo woman  Head: Normocephalic, atraumatic.  Eyes: conjunctivae/corneas clear.  EOM's intact.   Throat: normal  Neck:    Lungs:    clear  Heart:  RR, normal S1, s2  Abdomen:  Soft, non-tender, non-distended    Extremities: No edema   Neurologic: A&O X3, CN II - XII are grossly intact.   Psych: Normal     Labs: BMET:  Recent Labs  10/17/13 1134 10/18/13 0505  NA 140 141  K 4.0 4.1  CL 104 108  CO2 26 25  GLUCOSE 114* 95  BUN 18 19  CREATININE 0.81 0.83  CALCIUM 9.2 9.1    Liver function tests: No results found for this basename: AST, ALT, ALKPHOS, BILITOT, PROT, ALBUMIN,  in the last 72 hours No results found for this basename: LIPASE, AMYLASE,  in the last 72 hours  CBC:  Recent Labs  10/17/13 1134 10/18/13 0505  WBC 10.1 7.2  NEUTROABS 7.5  --   HGB 13.8 12.9  HCT 40.3 38.3  MCV 88.8 87.6  PLT 214 196     Cardiac Enzymes:  Recent Labs  10/17/13 1818 10/18/13 0505  TROPONINI <0.30 <0.30    Coagulation Studies: No results found for this basename: LABPROT, INR,  in the last 72 hours  Other: No components found with this basename: POCBNP,  No results found for this basename: DDIMER,  in the last 72 hours No results found for this basename: HGBA1C,  in the last 72 hours No results found for this basename: CHOL, HDL, LDLCALC, TRIG, CHOLHDL,  in the last 72 hours  Recent Labs  10/18/13 0505  TSH 1.747   No results found for this basename: VITAMINB12, FOLATE, FERRITIN, TIBC, IRON, RETICCTPCT,  in the last 72 hours   Other results:  Tele:  NSR,  Occasional [place].  No significant arrhythmias  Medications:    Infusions:    Scheduled Medications: . aspirin EC  81 mg Oral Daily  . enoxaparin (LOVENOX) injection  40 mg Subcutaneous Q24H  . fluticasone  1 puff Inhalation BID  . latanoprost  1 drop Both Eyes QHS  . loratadine  10 mg Oral Daily  . nitrofurantoin (macrocrystal-monohydrate)  100  mg Oral Q12H  . pantoprazole  80 mg Oral Q1200  . sodium chloride  3 mL Intravenous Q12H  . sodium chloride  3 mL Intravenous Q12H    Assessment/ Plan:      Syncope - in the setting of a UTI as she was coming out of the shower.  She has some orthostasis at baseline.   At her age of 54, I would permit her to have a bit higher BP than we would otherwise would accept.    The patient has been seen in our [place] office.    We will schedule her for a 30 day event monitor ( I have contacted our [place] office)   She otherwise appears to be stable and I think she could go home today if OK from the medicine team.    Active Problems:   CAD (coronary artery disease)   Asthma   Abdominal aneurysm without mention of rupture   UTI (lower urinary tract infection)   Second degree heart block   Disposition:  Length of Stay: 2  Vesta Mixer, Montez Hageman., MD, [facility] 10/19/2013, 8:03  AM Office [phone] Pager [phone]

## 2013-10-19 NOTE — Evaluation (Signed)
Physical Therapy Evaluation Patient Details Name: Ashlee Reyes MRN: [id number] DOB: [birth year] Today's Date: 10/19/2013 Time: 0920-0930 PT Time Calculation (min): 10 min  PT Assessment / Plan / Recommendation History of Present Illness  Ashlee Reyes is a 79yo woman with PMH of CAD, AAA with repair July 2014, recurrent UTIs, GERD, hearing loss, IBS, interstitial cystits, DJD, OA who presents for syncope.  Her daughter [name] is in the room and fills in gaps in the history.  Per the patient and family, Ashlee Reyes was getting out of the shower today when she noted that she felt very tired, which is abnormal for her.  Her daughter then started to do her hair when she slumped over and was blacked out for about 90 seconds.  Her daughter held her up and she did not hit the floor.  Upon awakening from this episodes she felt nauseous and may have vomited.  They presented for further follow up. While in the ED she had another episode lasting approximately 15 - 20 seconds.  Associated symptoms have included nausea that has persisted today and urinary symptoms including painful urination a couple of times and increased urinary frequency over the past 24 hours.  She has had syncope before in the past when she had a bad ear infection.  She reports no history of heart disease or stroke, but has CAD as a problem on her PL.  She has had a CT of the head which showed no acute abnormalities but some chronic changes.  She also had a CT of her abdomen given her recent AAA surgery which was stable.    Clinical Impression  Pt appears at baseline level of functioning, mod I with gait due to slow gait speed, independent with mobility.  Pt does not need further PT services at this time.    PT Assessment  Patent does not need any further PT services    Follow Up Recommendations  No PT follow up    Does the patient have the potential to tolerate intense rehabilitation      Barriers to Discharge        Equipment  Recommendations  None recommended by PT    Recommendations for Other Services     Frequency      Precautions / Restrictions Restrictions Weight Bearing Restrictions: No   Pertinent Vitals/Pain No c/o pain.      Mobility  Bed Mobility Bed Mobility: Supine to Sit Supine to Sit: 7: Independent Transfers Transfers: Editor, commissioning Transfers: 7: Independent Ambulation/Gait Ambulation/Gait Assistance: 6: Modified independent (Device/Increase time) Ambulation Distance (Feet): 200 Feet Assistive device: None Ambulation/Gait Assistance Details: slow cadence, able to correct LOB with gait challenges    Exercises     PT Diagnosis:    PT Problem List:   PT Treatment Interventions:       PT Goals(Current goals can be found in the care plan section)    Visit Information  Last PT Received On: 10/19/13 Assistance Needed: +1 History of Present Illness: Ashlee Reyes is a 79yo woman with PMH of CAD, AAA with repair July 2014, recurrent UTIs, GERD, hearing loss, IBS, interstitial cystits, DJD, OA who presents for syncope.  Her daughter [name] is in the room and fills in gaps in the history.  Per the patient and family, [name] was getting out of the shower today when she noted that she felt very tired, which is abnormal for her.  Her daughter then started to do her hair when she  slumped over and was blacked out for about 90 seconds.  Her daughter held her up and she did not hit the floor.  Upon awakening from this episodes she felt nauseous and may have vomited.  They presented for further follow up. While in the ED she had another episode lasting approximately 15 - 20 seconds.  Associated symptoms have included nausea that has persisted today and urinary symptoms including painful urination a couple of times and increased urinary frequency over the past 24 hours.  She has had syncope before in the past when she had a bad ear infection.  She reports no history of heart disease  or stroke, but has CAD as a problem on her PL.  She has had a CT of the head which showed no acute abnormalities but some chronic changes.  She also had a CT of her abdomen given her recent AAA surgery which was stable.         Prior Functioning  Home Living Family/patient expects to be discharged to:: Private residence Living Arrangements: Children Available Help at Discharge: Family Type of Home: House Home Access: Ramped entrance Home Layout: Two level;Full bath on main level;Able to live on main level with bedroom/bathroom Home Equipment: Dan Humphreys - 2 wheels;Cane - single point Prior Function Level of Independence: Independent Comments: used AD as she felt she needed them Communication Communication: No difficulties    Cognition  Cognition Arousal/Alertness: Awake/alert Behavior During Therapy: WFL for tasks assessed/performed Overall Cognitive Status: Within Functional Limits for tasks assessed    Extremity/Trunk Assessment Lower Extremity Assessment Lower Extremity Assessment: Overall WFL for tasks assessed Cervical / Trunk Assessment Cervical / Trunk Assessment: Normal   Balance Dynamic Standing Balance Dynamic Standing - Balance Support: During functional activity Dynamic Standing - Level of Assistance: 6: Modified independent (Device/Increase time)  End of Session PT - End of Session Activity Tolerance: Patient tolerated treatment well Patient left: in bed;with call bell/phone within reach  GP     [facility] 10/19/2013, 9:36 AM

## 2013-10-19 NOTE — Discharge Summary (Signed)
Physician Discharge Summary  Ashlee Reyes NWG:[id number] DOB: 04-Dec-1921 DOA: 10/17/2013  PCP: [name], MD  Admit date: 10/17/2013 Discharge date: 10/19/2013  Time spent: >35 minutes  Recommendations for Outpatient Follow-up:  F/u with cardiology for event monitor; cardiology to arrange  F/u with PCP in 1-2 weeks  Discharge Diagnoses:  Principal Problem:   Syncope Active Problems:   CAD (coronary artery disease)   Asthma   Abdominal aneurysm without mention of rupture   UTI (lower urinary tract infection)   Second degree heart block   Discharge Condition: stable   Diet recommendation: heart healthy   Filed Weights   10/18/13 0841  Weight: 72.576 kg (160 lb)    History of present illness:  Ashlee Reyes is a 79 yo woman with PMH of CAD, AAA repair in July 2014, recurrent UTIs, GERD who presents with a new UTI and syncope X 2. EKG with prolonged PR interval.    Hospital Course:  Syncope in the setting of UTI - Review of telemetry overnight revealed dropped beats with PR interval of 250, fixed, concerning for 2nd degree AV block, mobitz type 2.  - No further episodes of syncope  - TTE: LVEF 60%;  -patient seen examined by cardiology who will be arranging event monitor   Active Problems:  2. UTI (lower urinary tract infection)  - +klebsiella; to complete levofloxacin PO; patient has no allergy to quinolone; d/w patient, confirmed with her family   Procedures:  CXR (i.e. Studies not automatically included, echos, thoracentesis, etc; not x-rays)  Consultations:  cardiology  Discharge Exam: Filed Vitals:   10/19/13 0506  BP: 97/48  Pulse: 83  Temp:   Resp:     General: alert Cardiovascular: s1,s2 rrrr Respiratory: CTA BL   Discharge Instructions  Discharge Orders   Future Appointments Provider Department Dept Phone   01/07/2014 11:00 AM [facility] [address] [place] [facility] [phone]   01/07/2014 1:30 PM Sherren Kerns, MD [facility] [phone]   Future Orders Complete By Expires   Diet - low sodium heart healthy  As directed    Discharge instructions  As directed    Comments:     Please follow up with primary care doctor in 1-2 weeks   Increase activity slowly  As directed        Medication List         acetaminophen 500 MG tablet  Commonly known as:  TYLENOL  Take 500-1,000 mg by mouth every 4 (four) hours as needed. For pain     albuterol 108 (90 BASE) MCG/ACT inhaler  Commonly known as:  PROAIR HFA  Inhale 2 puffs into the lungs every 4 (four) hours as needed for wheezing. 2 puffs up to every 4 hours as needed for wheezing     aspirin 81 MG tablet  Take 81 mg by mouth daily.     beclomethasone 80 MCG/ACT inhaler  Commonly known as:  QVAR  Inhale 1 puff into the lungs 2 (two) times daily.     cetirizine 10 MG tablet  Commonly known as:  ZYRTEC  Take 10 mg by mouth daily as needed. For allergies     esomeprazole 40 MG capsule  Commonly known as:  NEXIUM  Take 40 mg by mouth daily before breakfast.     HEALTHY COLON Caps  Take 1 capsule by mouth daily as needed.     ipratropium 0.03 % nasal spray  Commonly known as:  ATROVENT  Place 2 sprays into the nose as needed.     latanoprost 0.005 % ophthalmic solution  Commonly known as:  XALATAN  Place 1 drop into both eyes at bedtime.     levofloxacin 750 MG tablet  Commonly known as:  LEVAQUIN  Take 1 tablet (750 mg total) by mouth daily.     ondansetron 4 MG tablet  Commonly known as:  ZOFRAN  Take 1 tablet (4 mg total) by mouth every 6 (six) hours as needed for nausea.     polyethylene glycol packet  Commonly known as:  MIRALAX / GLYCOLAX  Take 17 g by mouth daily as needed. For constipation     SYSTANE OP  Apply to eye as needed.       Allergies  Allergen Reactions  . Amoxicillin-Pot Clavulanate     REACTION: sick  . Avelox [Moxifloxacin Hcl In Nacl] Nausea Only  . Ibuprofen     REACTION: GI   . Keflex [Cephalexin]     abd pain / GI upset  . Omeprazole Nausea Only  . Rosuvastatin     REACTION: foot pain  . Sertraline Hcl     REACTION: more depressed  . Tetanus Toxoids     Local redness and swelling        Follow-up Information   Follow up with [name], MD In 1 week.   Specialties:  Family Medicine, Radiology   Contact information:   [address] [place] [address] [place] [place] [address] [phone]       Follow up with Elyn Aquas., MD In 1 week.   Specialty:  Cardiology   Contact information:   [address] [place] [address] [phone]        The results of significant diagnostics from this hospitalization (including imaging, microbiology, ancillary and laboratory) are listed below for reference.    Significant Diagnostic Studies: Ct Head Wo Contrast  10/17/2013   CLINICAL DATA:  Initial encounter for syncopal episode earlier today. Patient felt tired and we after getting out of the shower earlier this morning and a and lost consciousness for approximately 90 sec. Thereafter the patient complained of nausea and headache.  EXAM: CT HEAD WITHOUT CONTRAST  TECHNIQUE: Contiguous axial images were obtained from the base of the skull through the vertex without intravenous contrast.  COMPARISON:  MRI brain 12/02/2012.  FINDINGS: Ventricular system normal in size and appearance for age. Moderate cortical atrophy, moderate deep atrophy, and severe cerebellar atrophy. Moderate changes of small vessel disease of the white matter diffusely. More focal lacunar strokes in the basal ganglia bilaterally. Slightly hyperdense caudate nuclei and lenticular nuclei, of low signal on T2 weighted imaging on the prior MRI, consistent with expected mineral deposition related to age. Physiologic calcification the left basal ganglia. No mass lesion. No midline shift. No acute hemorrhage or hematoma. No extra-axial fluid collections. No evidence of  acute infarction.  No focal osseous abnormality involving the skull. Visualized paranasal sinuses, bilateral mastoid air cells, and bilateral middle ear cavities well-aerated. Extensive bilateral carotid siphon and bilateral vertebral artery atherosclerosis. Calcified 8 mm meningioma arising from the left frontal bone near the vertex and calcified 5 mm meningioma arising from the right frontal bone near the vertex, unchanged.  IMPRESSION: 1. No acute intracranial abnormality. 2. Moderate to severe generalized atrophy and moderate chronic microvascular ischemic changes of the white matter with focal lacunar strokes in the basal ganglia bilaterally. 3. Incidental subcentimeter bilateral frontal calcified  meningiomas as noted previously.   Electronically Signed   By: Hulan Saas M.D.   On: 10/17/2013 14:21   Dg Chest Portable 1 View  10/17/2013   CLINICAL DATA:  Loss of consciousness, asthma  EXAM: PORTABLE CHEST - 1 VIEW  COMPARISON:  07/01/2013  FINDINGS: Lung volumes are at lower limits of normal, with curvilinear retrocardiac opacity, likely atelectasis. Right lung is clear. Cardiac leads obscure detail. Heart size is mildly enlarged without evidence for edema. No acute osseous finding.  IMPRESSION: Low volumes with presumed retrocardiac atelectasis or scarring. If symptoms persist, consider PA and lateral chest radiographs obtained at full inspiration when the patient is clinically able.   Electronically Signed   By: Christiana Pellant M.D.   On: 10/17/2013 11:51   Ct Angio Chest Aorta W/cm &/or Wo/cm  10/17/2013   CLINICAL DATA:  Chest and abdominal pain. Nausea. Status post endovascular repair of abdominal aortic aneurysm. Clinical suspicion for aortic dissection.  EXAM: CT ANGIOGRAPHY CHEST, ABDOMEN AND PELVIS  TECHNIQUE: Multidetector CT imaging through the chest, abdomen and pelvis was performed using the standard protocol during bolus administration of intravenous contrast. Multiplanar reconstructed  images including MIPs were obtained and reviewed to evaluate the vascular anatomy.  CONTRAST:  OMNIPAQUE IOHEXOL 350 MG/ML SOLN  COMPARISON:  Abdomen pelvis CTA on 08/06/2013  FINDINGS: CTA CHEST FINDINGS  No evidence of thoracic aortic dissection or aneurysm. No evidence of mediastinal hematoma or mass. Mild cardiomegaly noted. No lymphadenopathy identified within the thorax.  No evidence of pleural or pericardial effusion. Mild parenchymal scarring noted bilaterally. No evidence of acute infiltrate or mass. No evidence of central endobronchial obstruction. No evidence of chest wall mass or suspicious bone lesions.  Review of the MIP images confirms the above findings.  CTA ABDOMEN AND PELVIS FINDINGS  Endovascular repair of infrarenal abdominal aortic aneurysm is stable in appearance. No evidence of endoleak or retroperitoneal hemorrhage. The excluded aneurysm sac currently measures 4.8 x 5.0 cm and is further decreased in size since previous study. No evidence of abdominal aortic dissection.  The abdominal parenchymal organs are normal in appearance except for several small renal cysts. Gallbladder is unremarkable. No evidence of hydronephrosis. No soft tissue masses or lymphadenopathy identified. Prior hysterectomy noted. Adnexal regions are unremarkable. Diverticulosis is seen involving the sigmoid colon, however there is no evidence of diverticulitis. No other inflammatory process or abnormal fluid collections identified. No suspicious bone lesions identified. Small paraumbilical ventral hernia containing only fat again noted.  Review of the MIP images confirms the above findings.  IMPRESSION: No evidence of thoracic aortic aneurysm or dissection.  Stable endovascular repair of infrarenal abdominal aortic aneurysm. No evidence of endoleak or other complication. No evidence of dissection or other acute findings.   Electronically Signed   By: Myles Rosenthal M.D.   On: 10/17/2013 14:15   Ct Angio Abd/pel W/  And/or W/o  10/17/2013   CLINICAL DATA:  Chest and abdominal pain. Nausea. Status post endovascular repair of abdominal aortic aneurysm. Clinical suspicion for aortic dissection.  EXAM: CT ANGIOGRAPHY CHEST, ABDOMEN AND PELVIS  TECHNIQUE: Multidetector CT imaging through the chest, abdomen and pelvis was performed using the standard protocol during bolus administration of intravenous contrast. Multiplanar reconstructed images including MIPs were obtained and reviewed to evaluate the vascular anatomy.  CONTRAST:  OMNIPAQUE IOHEXOL 350 MG/ML SOLN  COMPARISON:  Abdomen pelvis CTA on 08/06/2013  FINDINGS: CTA CHEST FINDINGS  No evidence of thoracic aortic dissection or aneurysm. No evidence of  mediastinal hematoma or mass. Mild cardiomegaly noted. No lymphadenopathy identified within the thorax.  No evidence of pleural or pericardial effusion. Mild parenchymal scarring noted bilaterally. No evidence of acute infiltrate or mass. No evidence of central endobronchial obstruction. No evidence of chest wall mass or suspicious bone lesions.  Review of the MIP images confirms the above findings.  CTA ABDOMEN AND PELVIS FINDINGS  Endovascular repair of infrarenal abdominal aortic aneurysm is stable in appearance. No evidence of endoleak or retroperitoneal hemorrhage. The excluded aneurysm sac currently measures 4.8 x 5.0 cm and is further decreased in size since previous study. No evidence of abdominal aortic dissection.  The abdominal parenchymal organs are normal in appearance except for several small renal cysts. Gallbladder is unremarkable. No evidence of hydronephrosis. No soft tissue masses or lymphadenopathy identified. Prior hysterectomy noted. Adnexal regions are unremarkable. Diverticulosis is seen involving the sigmoid colon, however there is no evidence of diverticulitis. No other inflammatory process or abnormal fluid collections identified. No suspicious bone lesions identified. Small paraumbilical ventral  hernia containing only fat again noted.  Review of the MIP images confirms the above findings.  IMPRESSION: No evidence of thoracic aortic aneurysm or dissection.  Stable endovascular repair of infrarenal abdominal aortic aneurysm. No evidence of endoleak or other complication. No evidence of dissection or other acute findings.   Electronically Signed   By: Myles Rosenthal M.D.   On: 10/17/2013 14:15    Microbiology: Recent Results (from the past 240 hour(s))  URINE CULTURE     Status: None   Collection Time    10/17/13 12:24 PM      Result Value Range Status   Specimen Description URINE, RANDOM   Final   Special Requests NONE   Final   Culture  Setup Time     Final   Value: 10/17/2013 18:12     Performed at [facility] Count     Final   Value: >=100,000 COLONIES/ML     Performed at [facility]   Culture     Final   Value: KLEBSIELLA PNEUMONIAE     Performed at [facility]   Report Status 10/19/2013 FINAL   Final   Organism ID, Bacteria KLEBSIELLA PNEUMONIAE   Final     Labs: Basic Metabolic Panel:  Recent Labs Lab 10/17/13 1134 10/18/13 0505  NA 140 141  K 4.0 4.1  CL 104 108  CO2 26 25  GLUCOSE 114* 95  BUN 18 19  CREATININE 0.81 0.83  CALCIUM 9.2 9.1   Liver Function Tests: No results found for this basename: AST, ALT, ALKPHOS, BILITOT, PROT, ALBUMIN,  in the last 168 hours No results found for this basename: LIPASE, AMYLASE,  in the last 168 hours No results found for this basename: AMMONIA,  in the last 168 hours CBC:  Recent Labs Lab 10/17/13 1134 10/18/13 0505  WBC 10.1 7.2  NEUTROABS 7.5  --   HGB 13.8 12.9  HCT 40.3 38.3  MCV 88.8 87.6  PLT 214 196   Cardiac Enzymes:  Recent Labs Lab 10/17/13 1818 10/18/13 0505  TROPONINI <0.30 <0.30   BNP: BNP (last 3 results) No results found for this basename: PROBNP,  in the last 8760 hours CBG: No results found for this basename: GLUCAP,  in the last 168  hours     Signed:  Jonette Mate N  [facility] 10/19/2013, 11:12 AM

## 2013-10-20 ENCOUNTER — Telehealth: Payer: Self-pay

## 2013-10-20 NOTE — Telephone Encounter (Signed)
Ashlee Reyes left v/m that she was notified by [facility] [address] That Levaquin from hospital is ready for pickup. Ashlee Reyes wants to know should she pick up Levaquin or does Dr Milinda Antis want pt to take Cipro.Ashlee Reyes request cb.

## 2013-10-20 NOTE — Telephone Encounter (Signed)
Spoke with Dr. Milinda Antis and since the levaquin is ready for pick-up she wants her to start taking it, daughter [name] notified, and f/u already scheduled

## 2013-10-20 NOTE — Telephone Encounter (Signed)
[name] pts daughter left v/m that pt was discharged from hospital and was prescribed Nitrofurantoin 100 mg for UTI and pt has only had 2 doses of med but one hour after taking med causing pt to have chest pain or indigestion with nausea. Linda request med changed to Cipro for UTI. [name] said pt will not take anymore Nitrofurantoin. [facility] [address] Linda request cb when sent to pharmacy.

## 2013-10-20 NOTE — Telephone Encounter (Signed)
I looked at her discharge summary and it looks like they sent her home on levaquin (did that change to microfurantoin?) In any case I am fine with cipro Please call in cipro 250 mg 1 po bid for 3 days # 6 no ref  Let me know if not feeling better Schedule f/u if not already done

## 2013-10-24 DIAGNOSIS — R55 Syncope and collapse: Secondary | ICD-10-CM

## 2013-10-27 ENCOUNTER — Telehealth: Payer: Self-pay

## 2013-10-27 NOTE — Telephone Encounter (Signed)
Ashlee Reyes pts mother left v/m; pt recently discharged from hospital with UTI and has finished antibiotic; pt still urinates q 1-2 hours; Ashlee Reyes request med for frequency called in. Pt will be on heart monitor for 30 days; pt is having burning in stomach; not sure if from medication or UTI or not eating a lot. Ashlee Reyes will see Dr Milinda Antis this morning with Ashlee Reyes at 10:30 today for possible guidance; pt has taken Cipro in the past; Ashlee Reyes did not specify pharmacy.Please advise.

## 2013-10-27 NOTE — Telephone Encounter (Signed)
I spoke to family member who happened to be here today  She finished levaquin-it may have upset stomach Can use azo otc for urinary pain if she has any  We will re check urine tomorrow before other measures Also adv her to alert urologist as well

## 2013-10-28 ENCOUNTER — Encounter: Payer: Self-pay | Admitting: Family Medicine

## 2013-10-28 ENCOUNTER — Ambulatory Visit (INDEPENDENT_AMBULATORY_CARE_PROVIDER_SITE_OTHER): Payer: Medicare Other | Admitting: Family Medicine

## 2013-10-28 ENCOUNTER — Encounter: Payer: Self-pay | Admitting: Cardiovascular Disease

## 2013-10-28 ENCOUNTER — Ambulatory Visit (INDEPENDENT_AMBULATORY_CARE_PROVIDER_SITE_OTHER): Payer: Medicare Other | Admitting: Cardiovascular Disease

## 2013-10-28 VITALS — BP 112/70 | HR 70 | Temp 97.5°F | Ht 66.0 in | Wt 161.0 lb

## 2013-10-28 VITALS — BP 131/73 | HR 71 | Ht 66.0 in | Wt 160.0 lb

## 2013-10-28 DIAGNOSIS — Z87440 Personal history of urinary (tract) infections: Secondary | ICD-10-CM

## 2013-10-28 DIAGNOSIS — K2970 Gastritis, unspecified, without bleeding: Secondary | ICD-10-CM | POA: Insufficient documentation

## 2013-10-28 DIAGNOSIS — N390 Urinary tract infection, site not specified: Secondary | ICD-10-CM

## 2013-10-28 DIAGNOSIS — R350 Frequency of micturition: Secondary | ICD-10-CM

## 2013-10-28 DIAGNOSIS — R55 Syncope and collapse: Secondary | ICD-10-CM | POA: Diagnosis not present

## 2013-10-28 LAB — POCT URINALYSIS DIPSTICK: Spec Grav, UA: 1.01

## 2013-10-28 LAB — POCT UA - MICROSCOPIC ONLY: Casts, Ur, LPF, POC: 0

## 2013-10-28 NOTE — Assessment & Plan Note (Signed)
See assessment for uti/ also has IC  cx pending Using azo and will update if not imp

## 2013-10-28 NOTE — Assessment & Plan Note (Addendum)
Ashlee Reyes has done well but she still have lots of generalized fatigue.  She has not had any additional episodes of syncope The 30 day monitor has not shown any significant arrhythmias.   Will see her in 6 months

## 2013-10-28 NOTE — Patient Instructions (Signed)
Your physician wants you to follow-up in: 6 MONTHS WITH EKG You will receive a reminder letter in the mail two months in advance. If you don't receive a letter, please call our office to schedule the follow-up appointment.

## 2013-10-28 NOTE — Progress Notes (Signed)
Subjective:    Patient ID: Ashlee Reyes, female    DOB: 1921/02/19, 79 y.o.   MRN: [id number]  HPI Here for hosp f/u  hosp for syncope 11/1-11/3- thought to be rel to cardiac cause 2nd degree heart block was found and event recorder initiated Saw cardiol today   The visit went well with Dr Melburn Popper - will proceed with monitor  He thinks the syncope was from uti   Also had klebsiella uti pensensitive tx with levaquin -done with that since Saturday   Yesterday - had a lot of urinary symptoms  Also the abx gave her a lot of GI side effects  Had burning / pain over her bladder area and frequency- went and got AZO  Thinks she still has an infection  Hx of chronic infections   "burns" from stomach through intestines Is constipated  Stool softeners and prune juice  Is taking her nexium one per day -no missed doses   (had protonix in hospital- but after eating)  Patient Active Problem List   Diagnosis Date Noted  . Gastritis 10/28/2013  . Second degree heart block 10/18/2013  . Syncope 10/17/2013  . UTI (lower urinary tract infection) 10/17/2013  . Abdominal aneurysm without mention of rupture 06/25/2013  . Osteopenia 01/13/2013  . CAD (coronary artery disease)   . Chronic depression 06/03/2007  . GERD 06/02/2007  . Hyperlipidemia   . Asthma   . Irritable bowel syndrome   . Chronic interstitial cystitis   . Osteoarthritis   . Spinal stenosis of lumbar region   . Recurrent urinary tract infections    Past Medical History  Diagnosis Date  . Osteoarthritis     spine/spinal stenosis  . GERD (gastroesophageal reflux disease)   . Hemorrhoids   . IC (irritable colon)     DMSO in past  . Allergic rhinitis, seasonal   . IBS (irritable bowel syndrome)   . Diverticulosis   . CAD (coronary artery disease) 07/2009    mod by cath 08/10  . AAA (abdominal aortic aneurysm)     followed by Dr Darrick Penna. Last u/s07/10 stable AAA with largest measurement 3.97 x 4.02cm.  . Skin cancer    basal cell cancer - face.  . Osteopenia 2011  . Hyperlipidemia   . Depression   . Asthma   . Interstitial cystitis   . History of recurrent UTIs   . Disc disease, degenerative, lumbar or lumbosacral     SPINAL STENOSIS   Past Surgical History  Procedure Laterality Date  . Right colectomy  1997    benign  . Abdominal hysterectomy      partial not cancer  . Cystocele repair    . Rectocele repair    . Hernia repair  11/1997    umbilical hernia  . Esophagogastroduodenoscopy  09/1998    erosive esphagitis ? stricture treated 12/2002  . Refractive surgery      for film after cataract surgery.  . Skin lesion excision  05/2011    basal cell skin lesion removed.  . Breast surgery      lumpectomy  . Abdominal aortic endovascular stent graft N/A 07/01/2013    Procedure: ABDOMINAL AORTIC ENDOVASCULAR STENT GRAFT- GORE;  Surgeon: Sherren Kerns, MD;  Location: [facility] OR;  Service: Vascular;  Laterality: N/A;  Ultrasound guided   History  Substance Use Topics  . Smoking status: Never Smoker   . Smokeless tobacco: Never Used  . Alcohol Use: No   Family History  Problem Relation  Age of Onset  . Heart disease Mother     deceased age 64 heart attack.  Marland Kitchen Heart attack Mother   . Other Mother     varicose veins  . Heart disease Father     age 50 heart attack  . Cancer Father   . Stroke Brother   . Cancer Brother   . Heart disease Brother   . Heart attack Brother   . Cancer Daughter   . Diabetes Son    Allergies  Allergen Reactions  . Amoxicillin-Pot Clavulanate     REACTION: sick  . Avelox [Moxifloxacin Hcl In Nacl] Nausea Only  . Ibuprofen     REACTION: GI  . Keflex [Cephalexin]     abd pain / GI upset  . Nitrofurantoin Other (See Comments)    Chest pain or indigestion with nausea  . Omeprazole Nausea Only  . Rosuvastatin     REACTION: foot pain  . Sertraline Hcl     REACTION: more depressed  . Tetanus Toxoids     Local redness and swelling    Current Outpatient  Prescriptions on File Prior to Visit  Medication Sig Dispense Refill  . acetaminophen (TYLENOL) 500 MG tablet Take 500-1,000 mg by mouth every 4 (four) hours as needed. For pain      . albuterol (PROAIR HFA) 108 (90 BASE) MCG/ACT inhaler Inhale 2 puffs into the lungs every 4 (four) hours as needed for wheezing. 2 puffs up to every 4 hours as needed for wheezing  1 Inhaler  11  . aspirin 81 MG tablet Take 81 mg by mouth daily.      . beclomethasone (QVAR) 80 MCG/ACT inhaler Inhale 1 puff into the lungs 2 (two) times daily.  1 Inhaler  11  . cetirizine (ZYRTEC) 10 MG tablet Take 10 mg by mouth daily as needed. For allergies      . esomeprazole (NEXIUM) 40 MG capsule Take 40 mg by mouth daily before breakfast.      . ipratropium (ATROVENT) 0.03 % nasal spray Place 2 sprays into the nose as needed.      . latanoprost (XALATAN) 0.005 % ophthalmic solution Place 1 drop into both eyes at bedtime.        Marland Kitchen levofloxacin (LEVAQUIN) 750 MG tablet Take 1 tablet (750 mg total) by mouth daily.  4 tablet  0  . ondansetron (ZOFRAN) 4 MG tablet Take 1 tablet (4 mg total) by mouth every 6 (six) hours as needed for nausea.  20 tablet  0  . Polyethyl Glycol-Propyl Glycol (SYSTANE OP) Apply to eye as needed.      . polyethylene glycol (MIRALAX / GLYCOLAX) packet Take 17 g by mouth daily as needed. For constipation      . Probiotic Product (HEALTHY COLON) CAPS Take 1 capsule by mouth daily as needed.        No current facility-administered medications on file prior to visit.    Review of Systems Review of Systems  Constitutional: Negative for fever, appetite change, fatigue and unexpected weight change.  Eyes: Negative for pain and visual disturbance.  Respiratory: Negative for cough and shortness of breath.   Cardiovascular: Negative for cp or palpitations    Gastrointestinal: Negative for nausea, diarrhea or vomiting/blood in stool.  Pos for "raw feeing in stomach and intenstines" Genitourinary: Negative for  urgency and pos for frequency with dysuria improved by AZO Skin: Negative for pallor or rash   Neurological: Negative for weakness, light-headedness, numbness and headaches.  Hematological: Negative for adenopathy. Does not bruise/bleed easily.  Psychiatric/Behavioral: Negative for dysphoric mood. The patient is nervous/anxious-mild        Objective:   Physical Exam  Constitutional: She appears well-developed and well-nourished. No distress.  HENT:  Head: Normocephalic and atraumatic.  Eyes: Conjunctivae and EOM are normal. Pupils are equal, round, and reactive to light. Right eye exhibits no discharge. Left eye exhibits no discharge. No scleral icterus.  Neck: Normal range of motion. Neck supple.  Cardiovascular: Normal rate and regular rhythm.  Exam reveals no gallop.   Pulmonary/Chest: Effort normal and breath sounds normal. No respiratory distress. She has no wheezes. She has no rales.  Abdominal: Soft. Bowel sounds are normal. She exhibits no shifting dullness, no distension, no abdominal bruit, no ascites and no mass. There is no hepatosplenomegaly. There is tenderness in the epigastric area and suprapubic area. There is no rigidity, no rebound, no guarding, no CVA tenderness, no tenderness at McBurney's point and negative Murphy's sign.  Musculoskeletal: She exhibits no edema and no tenderness.  No cva tenderness   Lymphadenopathy:    She has no cervical adenopathy.  Neurological: She is alert. She has normal reflexes. No cranial nerve deficit. She exhibits normal muscle tone. Coordination normal.  Skin: Skin is warm and dry. No rash noted. No erythema. No pallor.  Psychiatric: She has a normal mood and affect.          Assessment & Plan:

## 2013-10-28 NOTE — Assessment & Plan Note (Signed)
S/p antibiotic that upset her stomach This is improving now  Will inc her nexium to bid for 1 week  If no imp consider addn testing  Doubt c diff since she is constipated  ? Candidal infx or esophagitis in differential

## 2013-10-28 NOTE — Progress Notes (Signed)
Achilles Dunk Date of Birth  1921-05-19       [facility]    [place] [address]  [address], [address] [place], [place]  [address]   [place], [place]  [address] [phone]     [phone]   Fax  [phone]    Fax [phone]  Problem List: 1. Syncope 2. AAA - s/p stent graft repair  - July 2014.  followed by Dr. Darrick Penna  History of Present Illness:  Ms. Cerveny is will having episodes of weakness.   She has had some episodes of CP.  She had a stent graft repair of her AAA this past July and has never really improved since that time.   Current Outpatient Prescriptions on File Prior to Visit  Medication Sig Dispense Refill  . acetaminophen (TYLENOL) 500 MG tablet Take 500-1,000 mg by mouth every 4 (four) hours as needed. For pain      . albuterol (PROAIR HFA) 108 (90 BASE) MCG/ACT inhaler Inhale 2 puffs into the lungs every 4 (four) hours as needed for wheezing. 2 puffs up to every 4 hours as needed for wheezing  1 Inhaler  11  . aspirin 81 MG tablet Take 81 mg by mouth daily.      . beclomethasone (QVAR) 80 MCG/ACT inhaler Inhale 1 puff into the lungs 2 (two) times daily.  1 Inhaler  11  . cetirizine (ZYRTEC) 10 MG tablet Take 10 mg by mouth daily as needed. For allergies      . esomeprazole (NEXIUM) 40 MG capsule Take 40 mg by mouth daily before breakfast.      . latanoprost (XALATAN) 0.005 % ophthalmic solution Place 1 drop into both eyes at bedtime.        . ondansetron (ZOFRAN) 4 MG tablet Take 1 tablet (4 mg total) by mouth every 6 (six) hours as needed for nausea.  20 tablet  0  . Polyethyl Glycol-Propyl Glycol (SYSTANE OP) Apply to eye as needed.      . polyethylene glycol (MIRALAX / GLYCOLAX) packet Take 17 g by mouth daily as needed. For constipation      . Probiotic Product (HEALTHY COLON) CAPS Take 1 capsule by mouth daily as needed.       Marland Kitchen ipratropium (ATROVENT) 0.03 % nasal spray Place 2 sprays into the nose as needed.      Marland Kitchen  levofloxacin (LEVAQUIN) 750 MG tablet Take 1 tablet (750 mg total) by mouth daily.  4 tablet  0   No current facility-administered medications on file prior to visit.    Allergies  Allergen Reactions  . Amoxicillin-Pot Clavulanate     REACTION: sick  . Avelox [Moxifloxacin Hcl In Nacl] Nausea Only  . Ibuprofen     REACTION: GI  . Keflex [Cephalexin]     abd pain / GI upset  . Nitrofurantoin Other (See Comments)    Chest pain or indigestion with nausea  . Omeprazole Nausea Only  . Rosuvastatin     REACTION: foot pain  . Sertraline Hcl     REACTION: more depressed  . Tetanus Toxoids     Local redness and swelling     Past Medical History  Diagnosis Date  . Osteoarthritis     spine/spinal stenosis  . GERD (gastroesophageal reflux disease)   . Hemorrhoids   . IC (irritable colon)     DMSO in past  . Allergic rhinitis, seasonal   . IBS (irritable bowel  syndrome)   . Diverticulosis   . CAD (coronary artery disease) 07/2009    mod by cath 08/10  . AAA (abdominal aortic aneurysm)     followed by Dr Darrick Penna. Last u/s07/10 stable AAA with largest measurement 3.97 x 4.02cm.  . Skin cancer     basal cell cancer - face.  . Osteopenia 2011  . Hyperlipidemia   . Depression   . Asthma   . Interstitial cystitis   . History of recurrent UTIs   . Disc disease, degenerative, lumbar or lumbosacral     SPINAL STENOSIS    Past Surgical History  Procedure Laterality Date  . Right colectomy  1997    benign  . Abdominal hysterectomy      partial not cancer  . Cystocele repair    . Rectocele repair    . Hernia repair  11/1997    umbilical hernia  . Esophagogastroduodenoscopy  09/1998    erosive esphagitis ? stricture treated 12/2002  . Refractive surgery      for film after cataract surgery.  . Skin lesion excision  05/2011    basal cell skin lesion removed.  . Breast surgery      lumpectomy  . Abdominal aortic endovascular stent graft N/A 07/01/2013    Procedure: ABDOMINAL  AORTIC ENDOVASCULAR STENT GRAFT- GORE;  Surgeon: Sherren Kerns, MD;  Location: [facility] OR;  Service: Vascular;  Laterality: N/A;  Ultrasound guided    History  Smoking status  . Never Smoker   Smokeless tobacco  . Never Used    History  Alcohol Use No    Family History  Problem Relation Age of Onset  . Heart disease Mother     deceased age 56 heart attack.  Marland Kitchen Heart attack Mother   . Other Mother     varicose veins  . Heart disease Father     age 89 heart attack  . Cancer Father   . Stroke Brother   . Cancer Brother   . Heart disease Brother   . Heart attack Brother   . Cancer Daughter   . Diabetes Son     Reviw of Systems:  Reviewed in the HPI.  All other systems are negative.  Physical Exam: Blood pressure 131/73, pulse 71, height 5\' 6"  (1.676 m), weight 160 lb (72.576 kg), last menstrual period 12/17/1968. General: Well developed, well nourished, in no acute distress.  Head: Normocephalic, atraumatic, sclera non-icteric, mucus membranes are moist,   Neck: Supple. Carotids are 2 + without bruits. No JVD   Lungs: Clear   Heart: RR, normal S1, 2  Abdomen: Soft, non-tender, non-distended with normal bowel sounds.  Msk:  Strength and tone are normal   Extremities: No clubbing or cyanosis. No edema.  Distal pedal pulses are 2+ and equal    Neuro: CN II - XII intact.  Alert and oriented X 3.   Psych:  Normal   ECG:   Assessment / Plan:

## 2013-10-28 NOTE — Progress Notes (Signed)
Pre-visit discussion using our clinic review tool. No additional management support is needed unless otherwise documented below in the visit note.  

## 2013-10-28 NOTE — Patient Instructions (Signed)
Increase your nexium to 1 pill twice daily for 1 week and then go back to one pill daily  If no improvement in stomach symptoms let me know  Try hard to drink lots of water Continue azo as directed as needed for urinary pain (if pain worsens let me know)  I will advise you further when urine culture returns

## 2013-10-28 NOTE — Assessment & Plan Note (Signed)
Re check cx  Had klebsiella pan sens-tx with levaquin (she did not tolerate well) Now voiding symptoms/ bladder pain- but ua is clear  ? If IC Will continue AZO and lots of fluids until cx returns and go from there  Will have urol f/u if needed hosp records and studies rev in detail with pt from 11/1 to 11/3

## 2013-11-02 ENCOUNTER — Telehealth: Payer: Self-pay

## 2013-11-02 NOTE — Telephone Encounter (Signed)
Ashlee Reyes pts daughter request 10/28/13 uCX. Ashlee Reyes advised as instructed in result note for my chart.Ashlee Reyes voiced understanding. Ashlee Reyes had problems using my chart this time.

## 2013-11-10 DIAGNOSIS — N3010 Interstitial cystitis (chronic) without hematuria: Secondary | ICD-10-CM | POA: Diagnosis not present

## 2013-11-10 DIAGNOSIS — N3020 Other chronic cystitis without hematuria: Secondary | ICD-10-CM | POA: Diagnosis not present

## 2013-11-17 DIAGNOSIS — H01009 Unspecified blepharitis unspecified eye, unspecified eyelid: Secondary | ICD-10-CM | POA: Diagnosis not present

## 2013-11-17 DIAGNOSIS — Z87440 Personal history of urinary (tract) infections: Secondary | ICD-10-CM

## 2013-11-17 DIAGNOSIS — N390 Urinary tract infection, site not specified: Secondary | ICD-10-CM | POA: Diagnosis not present

## 2013-11-17 DIAGNOSIS — R350 Frequency of micturition: Secondary | ICD-10-CM | POA: Diagnosis not present

## 2013-11-17 DIAGNOSIS — K2970 Gastritis, unspecified, without bleeding: Secondary | ICD-10-CM

## 2013-11-17 DIAGNOSIS — H409 Unspecified glaucoma: Secondary | ICD-10-CM | POA: Diagnosis not present

## 2013-11-17 DIAGNOSIS — H43819 Vitreous degeneration, unspecified eye: Secondary | ICD-10-CM | POA: Diagnosis not present

## 2013-11-17 DIAGNOSIS — H4011X0 Primary open-angle glaucoma, stage unspecified: Secondary | ICD-10-CM | POA: Diagnosis not present

## 2013-11-24 ENCOUNTER — Other Ambulatory Visit: Payer: Self-pay | Admitting: *Deleted

## 2013-11-24 ENCOUNTER — Telehealth: Payer: Self-pay | Admitting: Vascular Surgery

## 2013-11-24 DIAGNOSIS — R55 Syncope and collapse: Secondary | ICD-10-CM

## 2013-11-24 NOTE — Telephone Encounter (Addendum)
Message copied by Fredrich Birks on Tue Nov 24, 2013  3:42 PM ------      Message from: Sherren Kerns      Created: Tue Nov 24, 2013  1:45 PM      Regarding: RE: Repeat CTA       No repeat      ----- Message -----         From: Fredrich Birks         Sent: 11/23/2013  10:30 AM           To: Melene Plan, RN, Sherren Kerns, MD      Subject: Repeat CTA                                               Dr Adolphus Birchwood was recently in the hospital and had a CTA abd/pelvis (in EPIC). She is due back to see you on 01/07/14 with CTA for follow up of EVAR. Does she need to repeat the CTA again in January?            Thanks,      [name]       ------  11/24/13: spoke with [name] to notify that it was not necessary to keep CTA- he will just see her in the office, dpm

## 2013-11-25 ENCOUNTER — Ambulatory Visit (INDEPENDENT_AMBULATORY_CARE_PROVIDER_SITE_OTHER): Payer: Medicare Other

## 2013-11-25 DIAGNOSIS — R55 Syncope and collapse: Secondary | ICD-10-CM

## 2013-11-26 ENCOUNTER — Telehealth: Payer: Self-pay

## 2013-11-26 NOTE — Telephone Encounter (Signed)
Notified [name] patient's daughter 30 day monitor came back with PVC's, PAC's with NSR per Dr. Elease Hashimoto.

## 2013-12-11 DIAGNOSIS — R351 Nocturia: Secondary | ICD-10-CM | POA: Diagnosis not present

## 2013-12-11 DIAGNOSIS — N3010 Interstitial cystitis (chronic) without hematuria: Secondary | ICD-10-CM | POA: Diagnosis not present

## 2013-12-11 DIAGNOSIS — R3915 Urgency of urination: Secondary | ICD-10-CM | POA: Diagnosis not present

## 2013-12-11 DIAGNOSIS — R109 Unspecified abdominal pain: Secondary | ICD-10-CM | POA: Diagnosis not present

## 2013-12-21 ENCOUNTER — Telehealth: Payer: Self-pay | Admitting: Family Medicine

## 2013-12-21 NOTE — Telephone Encounter (Signed)
Patient Information:  Caller Name: [name]  Phone: [phone]  Patient: Ashlee Reyes  Gender: Female  DOB: 15-Oct-1921  Age: 80 Years  PCP: Loura Pardon [facility])  Office Follow Up:  Does the office need to follow up with this patient?: Yes  Instructions For The Office: Please call and advise   Symptoms  Reason For Call & Symptoms: Daughter is calling for advice. Pt has diarrhea. Onset Monday 12/14/13. Pt had severe diarrhea - at least 1x/hr x 2 days. Daughter states 24 stools in 24 hours day 1 and day 2.  Now it is watery with mucous and specks of blood in it. Pt has had it x 7 days now. She has not had a bm yet today but she just ate and daughter states it starts after that.  As soon as she eats solids Marlana Salvage has a loose bm. Pt is drinking chicken broth, pedialyte and water. Pt last urinated this am. Pt is afeb. No vomiting. The daughter called for an appt - pt was given the next available appt was Wed 12/23/13.  Reviewed Health History In EMR: Yes  Reviewed Medications In EMR: Yes  Reviewed Allergies In EMR: Yes  Reviewed Surgeries / Procedures: Yes  Date of Onset of Symptoms: 12/14/2013  Guideline(s) Used:  Diarrhea  Disposition Per Guideline:   See Today in Office  Reason For Disposition Reached:   Mucus or pus in stool has been present > 2 days and diarrhea is more than mild  Advice Given:  N/A  RN Overrode Recommendation:  Patient Already Has Appt, Document Patient  No appts today. Pt was given next available on Wed and then the daughter called for advice. RN needs to send back over to the office since the disposition is see today given pts age and symptoms. Does MD want the pt to go to UC? or start Immodium? Please call and advise,

## 2013-12-21 NOTE — Telephone Encounter (Signed)
Daughter advised.

## 2013-12-21 NOTE — Telephone Encounter (Signed)
Daughter said pt is having really bad stomach pain, pt can't eat anything with out her stomach hurting really bad, pt can only drink broth/Pedialyte and anything else is causing bad stomach pain/ the diarrhea and the other sxs listed in CAN note. I advise pt's daughter not to give her imodium since she is having bad stomach pain, pt hasn't had any abx since November, daughter wants to know is there anything they can give her to help (medication and food)

## 2013-12-21 NOTE — Telephone Encounter (Signed)
If her pain is that bad- I want them to go ahead to [facility] right now - that is more worrisome than diarrhea alone

## 2013-12-21 NOTE — Telephone Encounter (Signed)
She can try some immodium otc as directed if she does not have abdominal pain  Watch for s/s of dehydration and eat bland diet with small servings  If any antibiotics more recent than nov that I am not aware of -let me know please-thanks

## 2013-12-23 ENCOUNTER — Ambulatory Visit (INDEPENDENT_AMBULATORY_CARE_PROVIDER_SITE_OTHER): Payer: Medicare Other | Admitting: Family Medicine

## 2013-12-23 ENCOUNTER — Encounter: Payer: Self-pay | Admitting: Family Medicine

## 2013-12-23 VITALS — BP 100/60 | HR 78 | Temp 98.0°F | Ht 66.0 in | Wt 160.5 lb

## 2013-12-23 DIAGNOSIS — R197 Diarrhea, unspecified: Secondary | ICD-10-CM | POA: Diagnosis not present

## 2013-12-23 NOTE — Progress Notes (Signed)
Subjective:    Patient ID: Ashlee Reyes, female    DOB: Jul 20, 1921, 80 y.o.   MRN: [id number]  HPI Pt here with diarrhea/ abd pain - that is getting better  Called with abd pain - and inst to go to ED / UC and pt would not go   12/26- started with urinary frequency- went to urology and she had a clear urine  Did Ashlee that weekend- a little uncomfortable   That following Monday-started diarrhea - watery and profuse (had eaten greens for dinner)  Stools had a lot of mucous as well - for 3 days  Did notice a spec of blood (BRB)- after irritation -only once  After a few days - ate jello and broth  Better for 2 d and then worse again -- over the following weekend  Lost continence of bowels   Never had a fever or felt sick (just weak) Never got dehydrated No nausea or vomiting  No one else got sick -- and whole family ate the whole thing   She lost her husband - but that was expected and she is doing well with that   abd pain - throughout the whole process  Gassy / bloated  Not as bad today-comes and goes like a burning sensation   Now started to eat a little - boiled egg and toast/ potato/ yogurt  Now 2 days since she had a bm   Last colonoscopy- a while ago with Dr Watt Climes   Patient Active Problem List   Diagnosis Date Noted  . Gastritis 10/28/2013  . Second degree heart block 10/18/2013  . Syncope 10/17/2013  . UTI (lower urinary tract infection) 10/17/2013  . Abdominal aneurysm without mention of rupture 06/25/2013  . Osteopenia 01/13/2013  . CAD (coronary artery disease)   . Chronic depression 06/03/2007  . GERD 06/02/2007  . Hyperlipidemia   . Asthma   . Irritable bowel syndrome   . Chronic interstitial cystitis   . Osteoarthritis   . Spinal stenosis of lumbar region   . Recurrent urinary tract infections    Past Medical History  Diagnosis Date  . Osteoarthritis     spine/spinal stenosis  . GERD (gastroesophageal reflux disease)   . Hemorrhoids   . IC  (irritable colon)     DMSO in past  . Allergic rhinitis, seasonal   . IBS (irritable bowel syndrome)   . Diverticulosis   . CAD (coronary artery disease) 07/2009    mod by cath 08/10  . AAA (abdominal aortic aneurysm)     followed by Dr Oneida Alar. Last u/s07/10 stable AAA with largest measurement 3.97 x 4.02cm.  . Skin cancer     basal cell cancer - face.  . Osteopenia 2011  . Hyperlipidemia   . Depression   . Asthma   . Interstitial cystitis   . History of recurrent UTIs   . Disc disease, degenerative, lumbar or lumbosacral     SPINAL STENOSIS   Past Surgical History  Procedure Laterality Date  . Right colectomy  1997    benign  . Abdominal hysterectomy      partial not cancer  . Cystocele repair    . Rectocele repair    . Hernia repair  [id number]    umbilical hernia  . Esophagogastroduodenoscopy  09/1998    erosive esphagitis ? stricture treated 12/2002  . Refractive surgery      for film after cataract surgery.  . Skin lesion excision  05/2011  basal cell skin lesion removed.  . Breast surgery      lumpectomy  . Abdominal aortic endovascular stent graft N/A 07/01/2013    Procedure: ABDOMINAL AORTIC ENDOVASCULAR STENT GRAFT- GORE;  Surgeon: Elam Dutch, MD;  Location: [facility];  Service: Vascular;  Laterality: N/A;  Ultrasound guided   History  Substance Use Topics  . Smoking status: Never Smoker   . Smokeless tobacco: Never Used  . Alcohol Use: No   Family History  Problem Relation Age of Onset  . Heart disease Mother     deceased age 77 heart attack.  Marland Kitchen Heart attack Mother   . Other Mother     varicose veins  . Heart disease Father     age 3 heart attack  . Cancer Father   . Stroke Brother   . Cancer Brother   . Heart disease Brother   . Heart attack Brother   . Cancer Daughter   . Diabetes Son    Allergies  Allergen Reactions  . Amoxicillin-Pot Clavulanate     REACTION: sick  . Avelox [Moxifloxacin Hcl In Nacl] Nausea Only  . Ibuprofen      REACTION: GI  . Keflex [Cephalexin]     abd pain / GI upset  . Nitrofurantoin Other (See Comments)    Chest pain or indigestion with nausea  . Omeprazole Nausea Only  . Rosuvastatin     REACTION: foot pain  . Sertraline Hcl     REACTION: more depressed  . Tetanus Toxoids     Local redness and swelling    Current Outpatient Prescriptions on File Prior to Visit  Medication Sig Dispense Refill  . acetaminophen (TYLENOL) 500 MG tablet Take 500-1,000 mg by mouth every 4 (four) hours as needed. For pain      . albuterol (PROAIR HFA) 108 (90 BASE) MCG/ACT inhaler Inhale 2 puffs into the lungs every 4 (four) hours as needed for wheezing. 2 puffs up to every 4 hours as needed for wheezing  1 Inhaler  11  . beclomethasone (QVAR) 80 MCG/ACT inhaler Inhale 1 puff into the lungs 2 (two) times daily.  1 Inhaler  11  . cetirizine (ZYRTEC) 10 MG tablet Take 10 mg by mouth daily as needed. For allergies      . esomeprazole (NEXIUM) 40 MG capsule Take 40 mg by mouth daily before breakfast.      . latanoprost (XALATAN) 0.005 % ophthalmic solution Place 1 drop into both eyes at bedtime.        Marland Kitchen Phenazopyridine HCl (AZO TABS PO) Take 97.5 mg by mouth as needed.       [name] Glycol-Propyl Glycol (SYSTANE OP) Apply to eye as needed.      . polyethylene glycol (MIRALAX / GLYCOLAX) packet Take 17 g by mouth daily as needed. For constipation      . Probiotic Product (HEALTHY COLON) CAPS Take 1 capsule by mouth daily as needed.       Marland Kitchen aspirin 81 MG tablet Take 81 mg by mouth daily.      Marland Kitchen ipratropium (ATROVENT) 0.03 % nasal spray Place 2 sprays into the nose as needed.      . ondansetron (ZOFRAN) 4 MG tablet Take 1 tablet (4 mg total) by mouth every 6 (six) hours as needed for nausea.  20 tablet  0   No current facility-administered medications on file prior to visit.    Review of Systems Review of Systems  Constitutional: Negative for fever, appetite  change, fatigue and unexpected weight change.    Eyes: Negative for pain and visual disturbance.  Respiratory: Negative for cough and shortness of breath.   Cardiovascular: Negative for cp or palpitations    Gastrointestinal: Negative for nausea, blood in stool, black stool, pos for abd cramping with BM Genitourinary: Negative for urgency and frequency.  Skin: Negative for pallor or rash   Neurological: Negative for weakness, light-headedness, numbness and headaches.  Hematological: Negative for adenopathy. Does not bruise/bleed easily.  Psychiatric/Behavioral: Negative for dysphoric mood. The patient is not nervous/anxious.  pos for grief        Objective:   Physical Exam  Constitutional: She appears well-developed and well-nourished. No distress.  HENT:  Head: Normocephalic and atraumatic.  Mouth/Throat: Oropharynx is clear and moist.  Eyes: Conjunctivae and EOM are normal. Pupils are equal, round, and reactive to light. No scleral icterus.  Neck: Normal range of motion. Neck supple.  Cardiovascular: Normal rate and regular rhythm.   Pulmonary/Chest: Effort normal. She has no rales.  Abdominal: Soft. Bowel sounds are normal. She exhibits no distension and no mass. There is no hepatosplenomegaly. There is tenderness in the right lower quadrant and left lower quadrant. There is no rigidity, no rebound, no guarding, no CVA tenderness, no tenderness at McBurney's point and negative Murphy's sign.  Mild tenderness in bilat LQ  Musculoskeletal: She exhibits no edema.  Lymphadenopathy:    She has no cervical adenopathy.  Neurological: She is alert. She has normal reflexes.  Skin: Skin is warm and dry. No rash noted. No pallor.  Psychiatric: She has a normal mood and affect.  In good spirits           Assessment & Plan:

## 2013-12-23 NOTE — Patient Instructions (Signed)
Drink fluids Stick a bland diet - advance slowly  Lab today  If suddenly worse - please call and go to ER if possible

## 2013-12-23 NOTE — Progress Notes (Signed)
Pre-visit discussion using our clinic review tool. No additional management support is needed unless otherwise documented below in the visit note.  

## 2013-12-24 ENCOUNTER — Telehealth: Payer: Self-pay

## 2013-12-24 DIAGNOSIS — R197 Diarrhea, unspecified: Secondary | ICD-10-CM | POA: Diagnosis not present

## 2013-12-24 LAB — COMPREHENSIVE METABOLIC PANEL
ALT: 12 U/L (ref 0–35)
AST: 15 U/L (ref 0–37)
Albumin: 3.7 g/dL (ref 3.5–5.2)
Alkaline Phosphatase: 53 U/L (ref 39–117)
BUN: 18 mg/dL (ref 6–23)
CALCIUM: 9.1 mg/dL (ref 8.4–10.5)
CHLORIDE: 107 meq/L (ref 96–112)
CO2: 28 meq/L (ref 19–32)
Creatinine, Ser: 1 mg/dL (ref 0.4–1.2)
GFR: 54.06 mL/min — AB (ref >60.00)
GLUCOSE: 94 mg/dL (ref 70–99)
Potassium: 4 mEq/L (ref 3.5–5.1)
Sodium: 141 mEq/L (ref 135–145)
Total Bilirubin: 0.6 mg/dL (ref 0.3–1.2)
Total Protein: 6.8 g/dL (ref 6.0–8.3)

## 2013-12-24 LAB — CBC WITH DIFFERENTIAL/PLATELET
BASOS ABS: 0.1 10*3/uL (ref 0.0–0.1)
Basophils Relative: 0.9 % (ref 0.0–3.0)
EOS PCT: 1.3 % (ref 0.0–5.0)
Eosinophils Absolute: 0.1 10*3/uL (ref 0.0–0.7)
HEMATOCRIT: 36.8 % (ref 36.0–46.0)
HEMOGLOBIN: 12.4 g/dL (ref 12.0–15.0)
LYMPHS ABS: 2.3 10*3/uL (ref 0.7–4.0)
LYMPHS PCT: 28.9 % (ref 12.0–46.0)
MCHC: 33.6 g/dL (ref 30.0–36.0)
MCV: 87.9 fl (ref 78.0–100.0)
MONOS PCT: 6.8 % (ref 3.0–12.0)
Monocytes Absolute: 0.5 10*3/uL (ref 0.1–1.0)
Neutro Abs: 5 10*3/uL (ref 1.4–7.7)
Neutrophils Relative %: 62.1 % (ref 43.0–77.0)
Platelets: 237 10*3/uL (ref 150.0–400.0)
RBC: 4.18 Mil/uL (ref 3.87–5.11)
RDW: 14.5 % (ref 11.5–14.6)
WBC: 8 10*3/uL (ref 4.5–10.5)

## 2013-12-24 NOTE — Assessment & Plan Note (Signed)
Differential is wide - but given improvement today - suspect viral etiol Lab today (no s/s of dehydration) c diff test in light of frequent abx and trip to hospital  If worse- inst to update or go to ER if necessary

## 2013-12-24 NOTE — Telephone Encounter (Signed)
Linda left v/m that pt had stool specimen sent to office today and [name] cannot use mychart; [name] request cb with how long will take to get stool test results and request cb with results when available.

## 2013-12-25 LAB — CLOSTRIDIUM DIFFICILE EIA: CDIFTX: NEGATIVE

## 2013-12-28 ENCOUNTER — Encounter: Payer: Self-pay | Admitting: Family Medicine

## 2013-12-28 NOTE — Telephone Encounter (Signed)
In that case- I will refer her to GI  First avail or PA is fine  If suddenly worse or abd pain in the meantime please let me know

## 2013-12-28 NOTE — Telephone Encounter (Signed)
Linda notified. 

## 2013-12-28 NOTE — Telephone Encounter (Signed)
[name] advise of labs/ stool labs, [name] wanted you to know pt is still having diarrhea and trouble eating things, pt is taking imodium and it will help with sxs but once the imodium wears off pt is back having diarrhea and stomach issues  Pt lost mychart information and wanted call with results

## 2013-12-31 DIAGNOSIS — K529 Noninfective gastroenteritis and colitis, unspecified: Secondary | ICD-10-CM | POA: Diagnosis not present

## 2013-12-31 DIAGNOSIS — R109 Unspecified abdominal pain: Secondary | ICD-10-CM | POA: Diagnosis not present

## 2014-01-06 ENCOUNTER — Encounter: Payer: Self-pay | Admitting: Vascular Surgery

## 2014-01-06 DIAGNOSIS — R109 Unspecified abdominal pain: Secondary | ICD-10-CM | POA: Diagnosis not present

## 2014-01-06 DIAGNOSIS — R350 Frequency of micturition: Secondary | ICD-10-CM | POA: Diagnosis not present

## 2014-01-07 ENCOUNTER — Ambulatory Visit (INDEPENDENT_AMBULATORY_CARE_PROVIDER_SITE_OTHER): Payer: Medicare Other | Admitting: Vascular Surgery

## 2014-01-07 ENCOUNTER — Encounter: Payer: Self-pay | Admitting: Vascular Surgery

## 2014-01-07 ENCOUNTER — Other Ambulatory Visit: Payer: Medicare Other

## 2014-01-07 VITALS — BP 120/78 | HR 72 | Resp 16 | Ht 66.5 in | Wt 160.0 lb

## 2014-01-07 DIAGNOSIS — I714 Abdominal aortic aneurysm, without rupture, unspecified: Secondary | ICD-10-CM | POA: Diagnosis not present

## 2014-01-07 DIAGNOSIS — Z48812 Encounter for surgical aftercare following surgery on the circulatory system: Secondary | ICD-10-CM

## 2014-01-07 NOTE — Progress Notes (Signed)
Patient is a 80 year old female who underwent endovascular stent graft repair of abdominal aortic aneurysm in July 2014. Overall she is been doing well. However, she had an episode of urinary tract infections in November of 2014 which prompted an additional CT scan to evaluate her aneurysm stent graft. At that time the aneurysm was 4.8 x 5.0 cm in diameter. It was 5.3 cm preoperatively. She has slowly recovered from this. However she still has some overall deconditioning. She has also been slightly depressed as her husband passed away approximately one month ago. She denies significant abdominal or back pain.   Review of systems: She denies shortness of breath. She denies chest pain.  Physical exam:  Filed Vitals:   01/07/14 1341  BP: 120/78  Pulse: 72  Resp: 16  Height: 5' 6.5" (1.689 m)  Weight: 160 lb (72.576 kg)    Abdomen: Soft nontender nondistended no pulsatile mass  At short Ms.: 2+ femoral pulses bilaterally  Neck: No carotid bruits  Chest:  Clear to auscultation bilaterally  Cardiac: Regular rate and rhythm   Assessment: Doing well status post endovascular stent graft repair. The patient will followup in July 2014 with a CT angiogram of the abdomen and pelvis. If her CT at that time is stable we will switch to yearly ultrasound  Plan: See above  Ruta Hinds, MD [facility] Office: [phone] Pager: [phone]

## 2014-02-17 DIAGNOSIS — N3020 Other chronic cystitis without hematuria: Secondary | ICD-10-CM | POA: Diagnosis not present

## 2014-02-17 DIAGNOSIS — R350 Frequency of micturition: Secondary | ICD-10-CM | POA: Diagnosis not present

## 2014-02-24 DIAGNOSIS — Z85828 Personal history of other malignant neoplasm of skin: Secondary | ICD-10-CM | POA: Diagnosis not present

## 2014-02-24 DIAGNOSIS — L919 Hypertrophic disorder of the skin, unspecified: Secondary | ICD-10-CM | POA: Diagnosis not present

## 2014-02-24 DIAGNOSIS — L819 Disorder of pigmentation, unspecified: Secondary | ICD-10-CM | POA: Diagnosis not present

## 2014-02-24 DIAGNOSIS — L820 Inflamed seborrheic keratosis: Secondary | ICD-10-CM | POA: Diagnosis not present

## 2014-02-24 DIAGNOSIS — D239 Other benign neoplasm of skin, unspecified: Secondary | ICD-10-CM | POA: Diagnosis not present

## 2014-02-24 DIAGNOSIS — D1801 Hemangioma of skin and subcutaneous tissue: Secondary | ICD-10-CM | POA: Diagnosis not present

## 2014-02-24 DIAGNOSIS — L821 Other seborrheic keratosis: Secondary | ICD-10-CM | POA: Diagnosis not present

## 2014-02-24 DIAGNOSIS — L723 Sebaceous cyst: Secondary | ICD-10-CM | POA: Diagnosis not present

## 2014-02-24 DIAGNOSIS — L909 Atrophic disorder of skin, unspecified: Secondary | ICD-10-CM | POA: Diagnosis not present

## 2014-03-11 DIAGNOSIS — N3020 Other chronic cystitis without hematuria: Secondary | ICD-10-CM | POA: Diagnosis not present

## 2014-03-11 DIAGNOSIS — N3946 Mixed incontinence: Secondary | ICD-10-CM | POA: Diagnosis not present

## 2014-03-11 DIAGNOSIS — R351 Nocturia: Secondary | ICD-10-CM | POA: Diagnosis not present

## 2014-04-02 ENCOUNTER — Encounter: Payer: Self-pay | Admitting: Podiatrist

## 2014-04-02 ENCOUNTER — Ambulatory Visit (INDEPENDENT_AMBULATORY_CARE_PROVIDER_SITE_OTHER): Payer: Medicare Other | Admitting: Podiatrist

## 2014-04-02 VITALS — BP 150/66 | HR 70 | Resp 12

## 2014-04-02 DIAGNOSIS — L600 Ingrowing nail: Secondary | ICD-10-CM

## 2014-04-02 NOTE — Patient Instructions (Signed)

## 2014-04-02 NOTE — Progress Notes (Signed)
   Subjective:    Patient ID: Ashlee Reyes, female    DOB: [birth year], 80 y.o.   MRN: [id number]  HPI PT STATED LT FOOT GREAT TOENAIL IS SORE FOR 1 MONTH. THE TOE IS GETTING WORSE. THE TOE GET AGGRAVATED BY PRESSURE. TRIED SOAK WITH EPSON SALT BUT DID NOT HELP.    Review of Systems  All other systems reviewed and are negative.      Objective:   Physical Exam  GENERAL APPEARANCE: Alert, conversant. Appropriately groomed. No acute distress.  VASCULAR: Pedal pulses palpable at 2/4 DP and PT bilateral.  Capillary refill time is immediate to all digits,  Proximal to distal cooling it warm to warm.  Digital hair growth is present bilateral  NEUROLOGIC: sensation is intact epicritically and protectively to 5.07 monofilament at 5/5 sites bilateral.  Light touch is intact bilateral, vibratory sensation intact bilateral, achilles tendon reflex is intact bilateral.  MUSCULOSKELETAL: large bunion prominence is present bilateral. Contracture of digits is present.     DERMATOLOGIC: left hallux nail is painful on the lateral side.  No drainage or malodor is present.  Redness and swellingalong the nail border is present.  Remainder of skin color, texture, and turger are within normal limits.  No preulcerative lesions are seen, no interdigital maceration noted.      Assessment & Plan:  Ingrown toenail left first lateral side, bunion   Plan: Treatment options and alternatives discussed.  Recommended permanent phenol matrixectomy and patient agreed.  Left hallux was prepped with alcohol and a 1 to 1 mix of 0.5% marcaine plain and 2% lidocaine plain was administered in a digital block fashion.  The toe was then prepped with betadine solution and exsanguinated.  The offending nail border was then excised and matrix tissue exposed.  Phenol was then applied to the matrix tissue followed by an alcohol wash.  Antibiotic ointment and a dry sterile dressing was applied.  The patient was dispensed instructions for  aftercare.   She asked to have her bunion fixed.  I declined due to her age and chance of problems with healing.  If she would like to pursue bunion surgery, I will refer her to another physician for a 2nd opinion regarding the surgery and her being a good candidate for this elective procedure.  No xrays were taken today   Refer her out if she is still wanting it

## 2014-04-12 DIAGNOSIS — R300 Dysuria: Secondary | ICD-10-CM | POA: Diagnosis not present

## 2014-04-12 DIAGNOSIS — N3020 Other chronic cystitis without hematuria: Secondary | ICD-10-CM | POA: Diagnosis not present

## 2014-05-12 DIAGNOSIS — N3020 Other chronic cystitis without hematuria: Secondary | ICD-10-CM | POA: Diagnosis not present

## 2014-05-12 DIAGNOSIS — R300 Dysuria: Secondary | ICD-10-CM | POA: Diagnosis not present

## 2014-05-13 ENCOUNTER — Encounter: Payer: Self-pay | Admitting: Internal Medicine

## 2014-05-13 ENCOUNTER — Ambulatory Visit (INDEPENDENT_AMBULATORY_CARE_PROVIDER_SITE_OTHER): Payer: Medicare Other | Admitting: Internal Medicine

## 2014-05-13 VITALS — BP 120/70 | HR 77 | Temp 98.5°F | Wt 161.0 lb

## 2014-05-13 DIAGNOSIS — R002 Palpitations: Secondary | ICD-10-CM

## 2014-05-13 NOTE — Progress Notes (Signed)
Subjective:    Patient ID: Ashlee Reyes, female    DOB: [birth year], 80 y.o.   MRN: [id number]  HPI Here with daughter [name]  Recent visit to urologist--interstitial cystitis Having some dizziness--this has persisted Noticed fast heartbeat a week ago--skipped beat and then went faster Able to walk to bathroom with walker-- some dysuria. Thought it was related to that but urologist didn't think so. Then tried 2 dramamine  Hasn't had vertigo Felt pressure in head and chest Some difficulty breathing Still doesn't feel good but no more palpitations   Started uribel 2 days ago--not before this first started Vertigo in past   Current Outpatient Prescriptions on File Prior to Visit  Medication Sig Dispense Refill  . acetaminophen (TYLENOL) 500 MG tablet Take 500-1,000 mg by mouth every 4 (four) hours as needed. For pain      . albuterol (PROAIR HFA) 108 (90 BASE) MCG/ACT inhaler Inhale 2 puffs into the lungs every 4 (four) hours as needed for wheezing. 2 puffs up to every 4 hours as needed for wheezing  1 Inhaler  11  . beclomethasone (QVAR) 80 MCG/ACT inhaler Inhale 1 puff into the lungs 2 (two) times daily.  1 Inhaler  11  . cetirizine (ZYRTEC) 10 MG tablet Take 10 mg by mouth daily as needed. For allergies      . esomeprazole (NEXIUM) 40 MG capsule Take 40 mg by mouth daily before breakfast.      . latanoprost (XALATAN) 0.005 % ophthalmic solution Place 1 drop into both eyes at bedtime.        Marland Kitchen Phenazopyridine HCl (AZO TABS PO) Take 97.5 mg by mouth as needed.       [name] Glycol-Propyl Glycol (SYSTANE OP) Apply to eye as needed.      . polyethylene glycol (MIRALAX / GLYCOLAX) packet Take 17 g by mouth daily as needed. For constipation       No current facility-administered medications on file prior to visit.    Allergies  Allergen Reactions  . Amoxicillin-Pot Clavulanate     REACTION: sick  . Avelox [Moxifloxacin Hcl In Nacl] Nausea Only  . Ibuprofen     REACTION: GI  .  Keflex [Cephalexin]     abd pain / GI upset  . Nitrofurantoin Other (See Comments)    Chest pain or indigestion with nausea  . Omeprazole Nausea Only  . Rosuvastatin     REACTION: foot pain  . Sertraline Hcl     REACTION: more depressed  . Tetanus Toxoids     Local redness and swelling     Past Medical History  Diagnosis Date  . Osteoarthritis     spine/spinal stenosis  . GERD (gastroesophageal reflux disease)   . Hemorrhoids   . IC (irritable colon)     DMSO in past  . Allergic rhinitis, seasonal   . IBS (irritable bowel syndrome)   . Diverticulosis   . CAD (coronary artery disease) 07/2009    mod by cath 08/10  . AAA (abdominal aortic aneurysm)     followed by Dr Oneida Alar. Last u/s07/10 stable AAA with largest measurement 3.97 x 4.02cm.  . Skin cancer     basal cell cancer - face.  . Osteopenia 2011  . Hyperlipidemia   . Depression   . Asthma   . Interstitial cystitis   . History of recurrent UTIs   . Disc disease, degenerative, lumbar or lumbosacral     SPINAL STENOSIS    Past Surgical History  Procedure Laterality Date  . Right colectomy  1997    benign  . Abdominal hysterectomy      partial not cancer  . Cystocele repair    . Rectocele repair    . Hernia repair  60/1093    umbilical hernia  . Esophagogastroduodenoscopy  09/1998    erosive esphagitis ? stricture treated 12/2002  . Refractive surgery      for film after cataract surgery.  . Skin lesion excision  05/2011    basal cell skin lesion removed.  . Breast surgery      lumpectomy  . Abdominal aortic endovascular stent graft N/A 07/01/2013    Procedure: ABDOMINAL AORTIC ENDOVASCULAR STENT GRAFT- GORE;  Surgeon: Elam Dutch, MD;  Location: [facility] OR;  Service: Vascular;  Laterality: N/A;  Ultrasound guided    Family History  Problem Relation Age of Onset  . Heart disease Mother     deceased age 62 heart attack.  Marland Kitchen Heart attack Mother   . Other Mother     varicose veins  . Heart disease Father      age 74 heart attack  . Cancer Father   . Stroke Brother   . Cancer Brother   . Heart disease Brother   . Heart attack Brother   . Cancer Daughter   . Diabetes Son     History   Social History  . Marital Status: Married    Spouse Name: N/A    Number of Children: N/A  . Years of Education: N/A   Occupational History  . Not on file.   Social History Main Topics  . Smoking status: Never Smoker   . Smokeless tobacco: Never Used  . Alcohol Use: No  . Drug Use: No  . Sexual Activity: Not on file   Other Topics Concern  . Not on file   Social History Narrative  . No narrative on file   Review of Systems No fever Some breathing problems---like something in her throat Only mild AM cough to clear mucus    Objective:   Physical Exam  Constitutional: She appears well-developed and well-nourished. No distress.  Mild orthostatic dizziness  Neck: Normal range of motion. Neck supple. No thyromegaly present.  Cardiovascular: Normal rate and normal heart sounds.  Exam reveals no gallop.   No murmur heard. Slightly irregular  Pulmonary/Chest: Effort normal and breath sounds normal. No respiratory distress. She has no wheezes. She has no rales.  Abdominal: Soft. There is no tenderness.  Musculoskeletal: She exhibits no edema and no tenderness.  Lymphadenopathy:    She has no cervical adenopathy.  Psychiatric: She has a normal mood and affect. Her behavior is normal.          Assessment & Plan:

## 2014-05-13 NOTE — Progress Notes (Signed)
Pre visit review using our clinic review tool, if applicable. No additional management support is needed unless otherwise documented below in the visit note. 

## 2014-05-13 NOTE — Assessment & Plan Note (Signed)
Heart sounded irregular but EKG shows sinus without any signs of ischemia Has dizziness that doesn't sound vestibular and is not clearly orthostatic (at least not mainly) Will check blood work May want to hold the uribelle---but these symptoms started before this

## 2014-05-13 NOTE — Patient Instructions (Signed)
Please hold off on the zyrtec and uribelle for now---to see if things get better.

## 2014-05-14 LAB — COMPREHENSIVE METABOLIC PANEL
ALK PHOS: 61 U/L (ref 39–117)
ALT: 11 U/L (ref 0–35)
AST: 15 U/L (ref 0–37)
Albumin: 3.7 g/dL (ref 3.5–5.2)
BILIRUBIN TOTAL: 0.6 mg/dL (ref 0.2–1.2)
BUN: 31 mg/dL — AB (ref 6–23)
CO2: 27 mEq/L (ref 19–32)
CREATININE: 1 mg/dL (ref 0.4–1.2)
Calcium: 9.3 mg/dL (ref 8.4–10.5)
Chloride: 107 mEq/L (ref 96–112)
GFR: 56.57 mL/min — ABNORMAL LOW (ref >60.00)
Glucose, Bld: 58 mg/dL — ABNORMAL LOW (ref 70–99)
Potassium: 4 mEq/L (ref 3.5–5.1)
Sodium: 140 mEq/L (ref 135–145)
Total Protein: 6.6 g/dL (ref 6.0–8.3)

## 2014-05-14 LAB — T4, FREE: Free T4: 0.74 ng/dL (ref 0.60–1.60)

## 2014-05-14 LAB — CBC WITH DIFFERENTIAL/PLATELET
BASOS PCT: 0.3 % (ref 0.0–3.0)
Basophils Absolute: 0 10*3/uL (ref 0.0–0.1)
EOS PCT: 1.3 % (ref 0.0–5.0)
Eosinophils Absolute: 0.1 10*3/uL (ref 0.0–0.7)
HCT: 38.9 % (ref 36.0–46.0)
HEMOGLOBIN: 13 g/dL (ref 12.0–15.0)
Lymphocytes Relative: 35 % (ref 12.0–46.0)
Lymphs Abs: 2.9 10*3/uL (ref 0.7–4.0)
MCHC: 33.5 g/dL (ref 30.0–36.0)
MCV: 88.9 fl (ref 78.0–100.0)
MONO ABS: 0.6 10*3/uL (ref 0.1–1.0)
Monocytes Relative: 7.7 % (ref 3.0–12.0)
NEUTROS ABS: 4.6 10*3/uL (ref 1.4–7.7)
Neutrophils Relative %: 55.7 % (ref 43.0–77.0)
Platelets: 222 10*3/uL (ref 150.0–400.0)
RBC: 4.37 Mil/uL (ref 3.87–5.11)
RDW: 14.7 % (ref 11.5–15.5)
WBC: 8.3 10*3/uL (ref 4.0–10.5)

## 2014-05-14 LAB — TSH: TSH: 3.26 u[IU]/mL (ref 0.35–4.50)

## 2014-05-18 DIAGNOSIS — H409 Unspecified glaucoma: Secondary | ICD-10-CM | POA: Diagnosis not present

## 2014-05-18 DIAGNOSIS — H4011X0 Primary open-angle glaucoma, stage unspecified: Secondary | ICD-10-CM | POA: Diagnosis not present

## 2014-07-07 ENCOUNTER — Other Ambulatory Visit: Payer: Self-pay | Admitting: Vascular Surgery

## 2014-07-07 ENCOUNTER — Encounter: Payer: Self-pay | Admitting: Vascular Surgery

## 2014-07-07 DIAGNOSIS — I714 Abdominal aortic aneurysm, without rupture, unspecified: Secondary | ICD-10-CM | POA: Diagnosis not present

## 2014-07-07 LAB — CREATININE, SERUM: Creat: 0.82 mg/dL (ref 0.50–1.10)

## 2014-07-07 LAB — BUN: BUN: 18 mg/dL (ref 6–23)

## 2014-07-08 ENCOUNTER — Encounter: Payer: Self-pay | Admitting: Vascular Surgery

## 2014-07-08 ENCOUNTER — Ambulatory Visit
Admission: RE | Admit: 2014-07-08 | Discharge: 2014-07-08 | Disposition: A | Discharge status: Home / Self Care | Payer: Medicare Other | Source: Ambulatory Visit | Attending: Vascular Surgery | Admitting: Vascular Surgery

## 2014-07-08 ENCOUNTER — Ambulatory Visit (INDEPENDENT_AMBULATORY_CARE_PROVIDER_SITE_OTHER): Payer: Medicare Other | Admitting: Vascular Surgery

## 2014-07-08 VITALS — BP 118/61 | HR 77 | Ht 66.5 in | Wt 161.0 lb

## 2014-07-08 DIAGNOSIS — H9319 Tinnitus, unspecified ear: Secondary | ICD-10-CM | POA: Diagnosis not present

## 2014-07-08 DIAGNOSIS — H93A2 Pulsatile tinnitus, left ear: Secondary | ICD-10-CM | POA: Insufficient documentation

## 2014-07-08 DIAGNOSIS — I714 Abdominal aortic aneurysm, without rupture, unspecified: Secondary | ICD-10-CM

## 2014-07-08 DIAGNOSIS — Z48812 Encounter for surgical aftercare following surgery on the circulatory system: Secondary | ICD-10-CM | POA: Diagnosis not present

## 2014-07-08 MED ORDER — IOHEXOL 350 MG/ML SOLN
80.0000 mL | Freq: Once | INTRAVENOUS | Status: AC | PRN
  Administered 2014-07-08: 80 mL via INTRAVENOUS

## 2014-07-08 NOTE — Addendum Note (Signed)
Addended by: Mena Goes on: 07/08/2014 02:53 PM   Modules accepted: Orders

## 2014-07-08 NOTE — Progress Notes (Addendum)
Patient is a 80 year old female who underwent endovascular stent graft repair of abdominal aortic aneurysm in July 2014. Overall she is been doing well.  At her last office visit the aneurysm was 4.8 x 5.0 cm in diameter. It was 5.3 cm preoperatively. She also complains of pulsatile tinnitus. This has been present for approximately 6 months to a year. She has no history of stroke TIA or amaurosis.  Review of systems: She denies shortness of breath. She denies chest pain.  Physical exam:  Filed Vitals:   07/08/14 1337  BP: 118/61  Pulse: 77  Height: 5' 6.5" (1.689 m)  Weight: 161 lb (73.029 kg)  SpO2: 99%   Abdomen: Soft nontender nondistended no pulsatile mass  Extremities.: 2+ femoral pulses bilaterally  Neck: No right carotid bruit, soft left carotid bruit  Chest:  Clear to auscultation bilaterally  Cardiac: Regular rate and rhythm   Data: AAA decreased from 5 cm to 3.9 cm  Assessment/Plan: Doing well status post endovascular stent graft repair. The patient will have a repeat ultrasound in one year. Patient was also found to have a left carotid bruit today. She denies history of stroke but does have pulsatile tinnitus. We will arrange for her carotid duplex scan next week for further evaluation of this.  Ruta Hinds, MD [facility] Office: [phone] Pager: [phone]   Addendum: Carotid duplex <40% bilaterally.  Will repeat in one year.  Called pt daughter with results who will relay to pt.  Ruta Hinds, MD [facility] Office: [phone] Pager: [phone]

## 2014-07-12 ENCOUNTER — Ambulatory Visit (HOSPITAL_COMMUNITY)
Admission: RE | Admit: 2014-07-12 | Discharge: 2014-07-12 | Disposition: A | Discharge status: Home / Self Care | Payer: Medicare Other | Source: Ambulatory Visit | Attending: Surgery | Admitting: Surgery

## 2014-07-12 DIAGNOSIS — H9319 Tinnitus, unspecified ear: Secondary | ICD-10-CM | POA: Diagnosis not present

## 2014-07-12 DIAGNOSIS — H93A2 Pulsatile tinnitus, left ear: Secondary | ICD-10-CM

## 2014-09-28 DIAGNOSIS — J206 Acute bronchitis due to rhinovirus: Secondary | ICD-10-CM | POA: Diagnosis not present

## 2014-09-28 DIAGNOSIS — R05 Cough: Secondary | ICD-10-CM | POA: Diagnosis not present

## 2014-09-28 DIAGNOSIS — D105 Benign neoplasm of other parts of oropharynx: Secondary | ICD-10-CM | POA: Diagnosis not present

## 2014-10-01 ENCOUNTER — Other Ambulatory Visit: Payer: Self-pay

## 2014-10-19 DIAGNOSIS — J4531 Mild persistent asthma with (acute) exacerbation: Secondary | ICD-10-CM | POA: Diagnosis not present

## 2014-10-19 DIAGNOSIS — J309 Allergic rhinitis, unspecified: Secondary | ICD-10-CM | POA: Diagnosis not present

## 2014-10-28 IMAGING — CT CT CTA ABD/PEL W/CM AND/OR W/O CM
3 of 10 series · 10 of 36 positions shown, 15 images · IV contrast (80CC OMNI 350)
Comparison: Preoperative CTA of the abdomen and pelvis 06/25/2013

CLINICAL DATA: [AGE] female status post endovascular aortic
repair of infrarenal abdominal aortic aneurysm.  First
postoperative follow-up study.

CT ANGIOGRAPHY ABDOMEN AND PELVIS WITH CONTRAST AND WITHOUT
CONTRAST

[Series 5: angio 2.5 · axial · 0.78mm/px · z∈[-357,-102]mm · 4 of 170 slices shown, 9 images]
[im 34/170  soft-tissue]
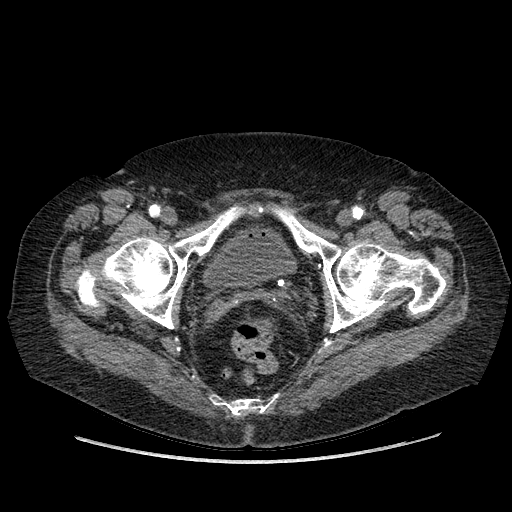
[im 34/170  lung]
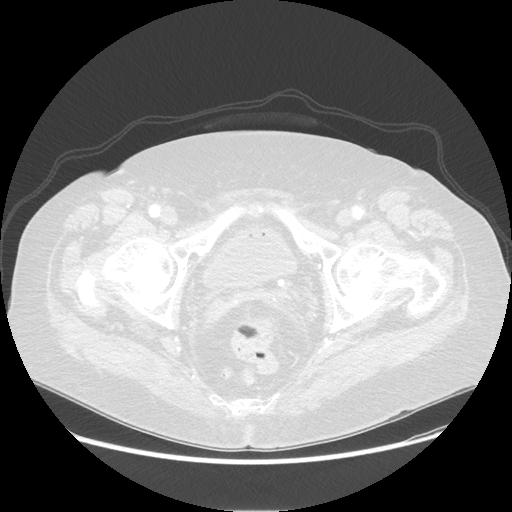
[im 34/170  bone]
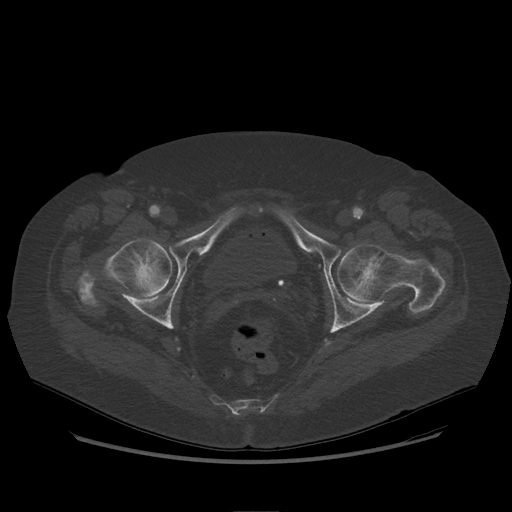
[im 68/170  soft-tissue]
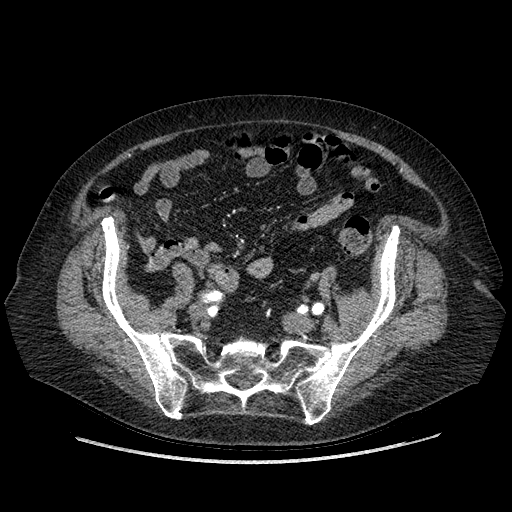
[im 68/170  lung]
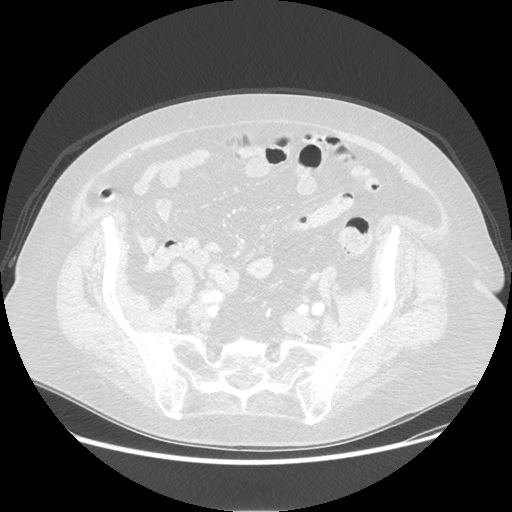
[im 102/170  soft-tissue]
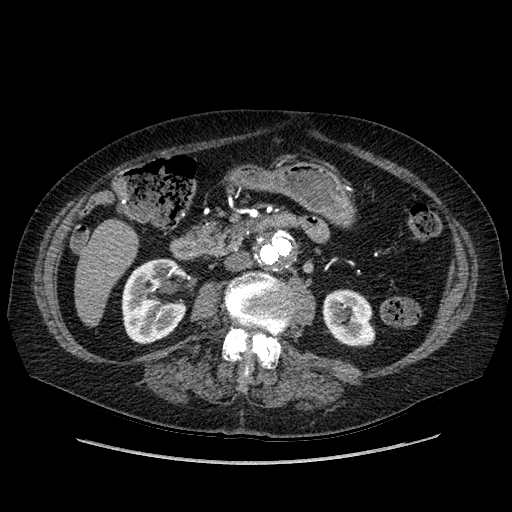
[im 102/170  lung]
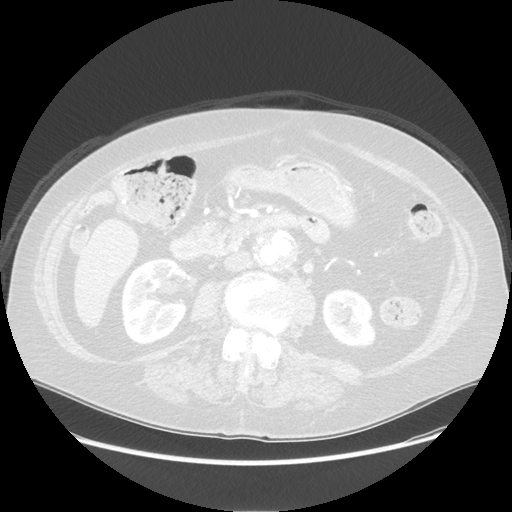
[im 136/170  soft-tissue]
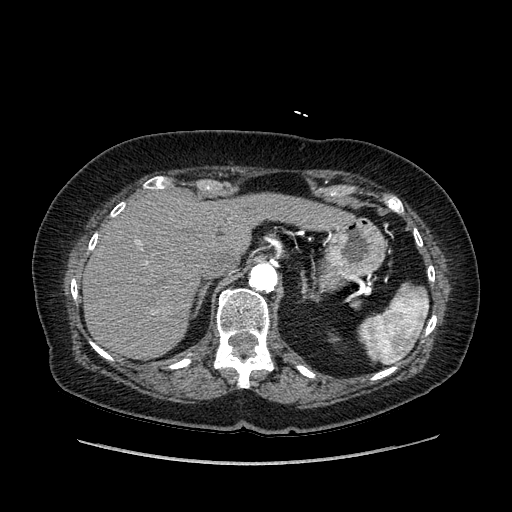
[im 136/170  lung]
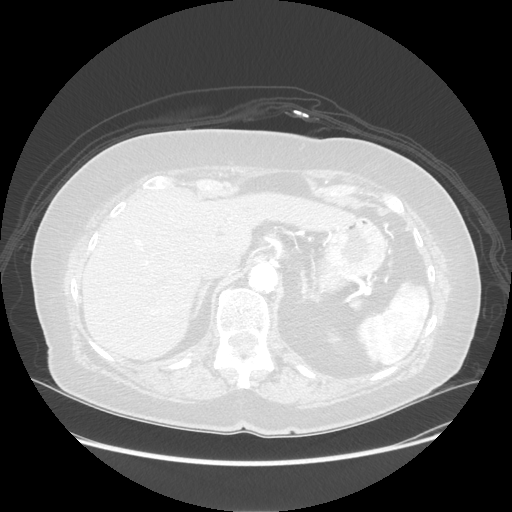

[Series 602: sagittal body · sagittal · 0.83mm/px · 3 of 161 slices shown (1 of 2)]
[im 41/161  soft-tissue]
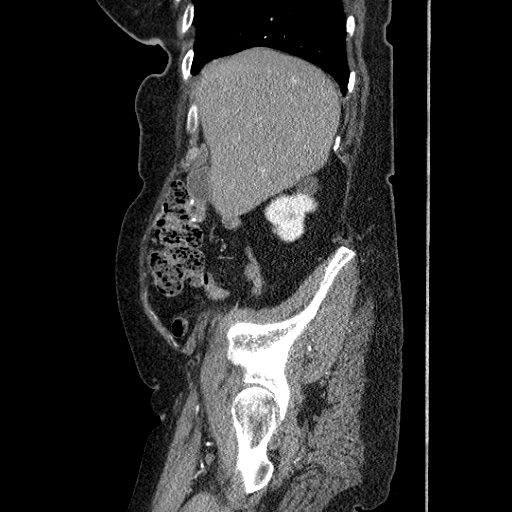
[im 81/161  soft-tissue]
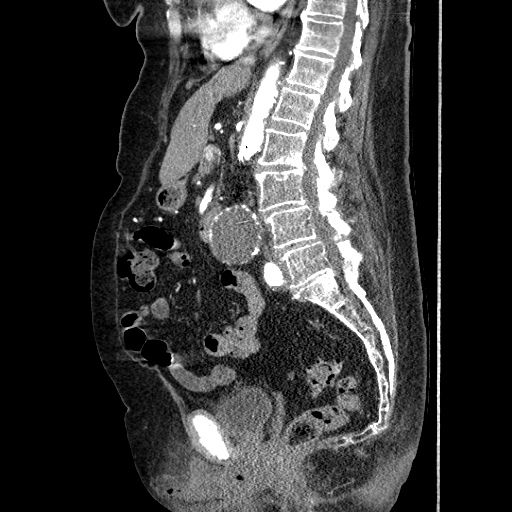
[im 121/161  soft-tissue]
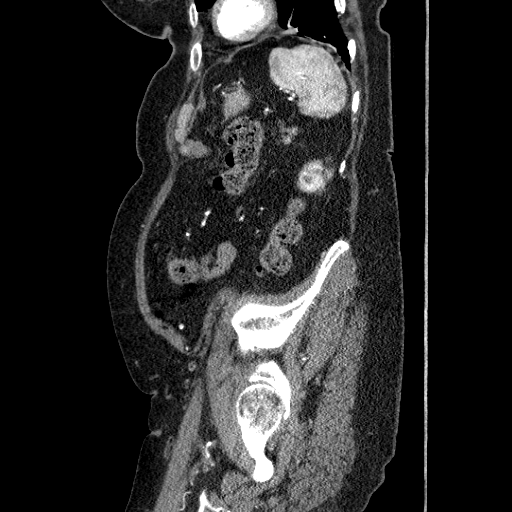

[Series 607: sagittal body · sagittal · 0.78mm/px · 3 of 158 slices shown (2 of 2)]
[im 40/158  soft-tissue]
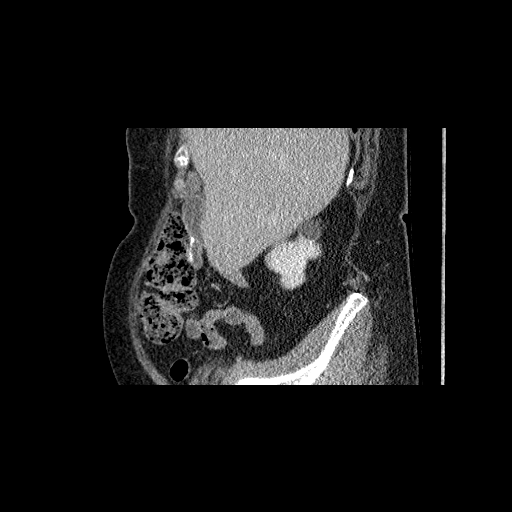
[im 79/158  soft-tissue]
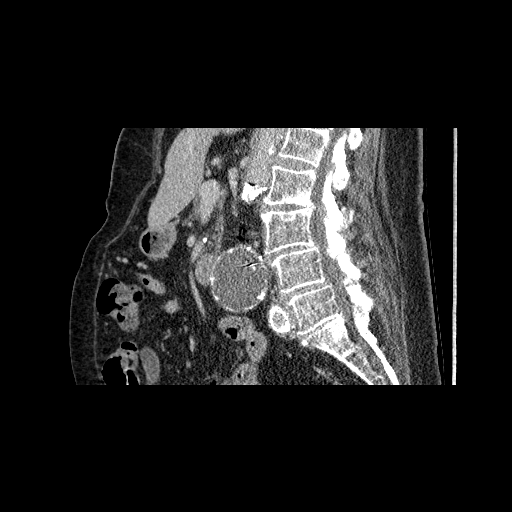
[im 118/158  soft-tissue]
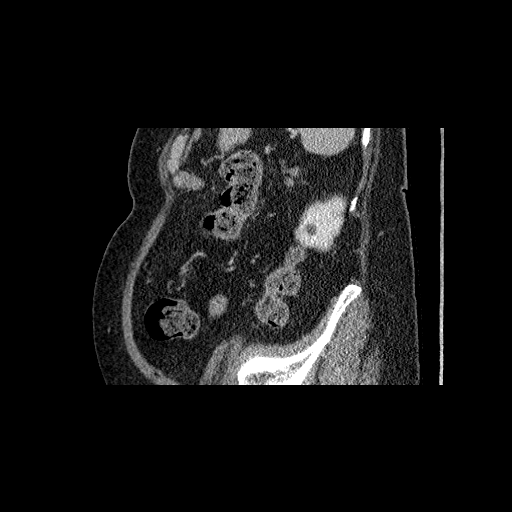

[10 of 36 positions shown; findings below may reference images not displayed]

FINDINGS: VASCULAR

Aorta: Surgical changes of endovascular aortic repair with a
bifurcated endoprosthesis.  The endoprosthesis extends from just
below the renal arteries into the bilateral common iliac arteries.
The excluded aneurysm sac is slightly decreased in size currently
measuring 5.2 x 4.9 cm compared to 5.3 x 5.2 cm.  No evidence of
endoleak, graft 98 migration or limb occlusion.

Celiac: Stable appearance of severe narrowing 1.1 cm from the
origin.  Narrowing comes from the superior aspect consistent with
median arcuate ligament compression.  Mild post another dilatation
is unchanged.  Conventional hepatic arterial anatomy.

SMA: Patent.

Renals: Single dominant renal arteries bilaterally.  Mild narrowing
secondary to noncalcified atherosclerotic plaque at the origin of
the left renal artery.

IMA: Occluded at the origin.  The vessel opacifies via retrograde
flow.

Inflow: Extensive atherosclerotic vascular disease with at least
moderate narrowing of the bilateral hypogastric artery origins.
The bilateral external iliac arteries are diseased proximally.
There is no significant appearing stenosis.

Proximal Outflow: Scattered atherosclerotic vascular calcifications
in the bilateral common femoral and visualized proximal superficial
profunda femoral arteries without focal stenosis.  The

Veins: No focal venous abnormality.

NON-VASCULAR

Lower Chest: Trace dependent atelectasis versus scarring in the
inferior lingula.  The lung bases are otherwise clear.  Stable
cardiomegaly with left ventricular dilatation.  No pericardial
effusion.  Unremarkable visualized distal thoracic esophagus.

Abdomen: Unremarkable CT appearance of the stomach.  Moderately
large periampullary duodenal diverticulum.  Unremarkable appearance
of the spleen, adrenal glands and pancreas save for diffuse fatty
atrophy.  Unchanged configuration of the liver with a relatively
hypoplastic left hepatic lobe. Gallbladder is unremarkable. No
intra or extrahepatic biliary ductal dilatation.

Stable appearance of the kidneys with mild diffuse renal cortical
atrophy bilaterally and multiple hypoattenuating lesions which are
too small to characterize but statistically highly likely cysts.  A
single 1.7 cm simple cyst exophytic from the interpolar right
kidney is unchanged.  Parapelvic cysts are also noted bilaterally
on the delayed phase imaging.  No hydronephrosis.  Small fat
containing midline ventral abdominal hernia.  Extensive sigmoid
colonic diverticular disease without evidence of active
inflammation.  Surgical changes of prior right hemicolectomy or see
colectomy.  The ileocolonic anastomosis is patent.

Pelvis: Small locules of air again noted within the urinary
bladder.  Surgical changes of prior hysterectomy.  No free fluid or
suspicious adenopathy.

Bones: No acute fracture or aggressive appearing lytic or blastic
osseous lesion.
IMPRESSION: 1.  Endovascular repair of infrarenal abdominal aortic aneurysm
without evidence of graft complication or endoleak.  The excluded
aneurysm sac has slightly decreased in size measuring 5.2 x 4.9 cm
compared to 5.3 x 5.2 cm previously.

2.  Additional ancillary findings as above without significant
interval change.

[REDACTED]

## 2014-11-02 DIAGNOSIS — J4540 Moderate persistent asthma, uncomplicated: Secondary | ICD-10-CM | POA: Diagnosis not present

## 2014-12-28 DIAGNOSIS — H4011X3 Primary open-angle glaucoma, severe stage: Secondary | ICD-10-CM | POA: Diagnosis not present

## 2014-12-28 DIAGNOSIS — Z961 Presence of intraocular lens: Secondary | ICD-10-CM | POA: Diagnosis not present

## 2014-12-28 DIAGNOSIS — H43813 Vitreous degeneration, bilateral: Secondary | ICD-10-CM | POA: Diagnosis not present

## 2014-12-28 DIAGNOSIS — H01001 Unspecified blepharitis right upper eyelid: Secondary | ICD-10-CM | POA: Diagnosis not present

## 2015-01-11 ENCOUNTER — Inpatient Hospital Stay (HOSPITAL_COMMUNITY)
Admission: EM | Admit: 2015-01-11 | Discharge: 2015-01-12 | DRG: 312 | Disposition: A | Discharge status: Home Health | Payer: Medicare Other | Attending: Internal Medicine | Admitting: Internal Medicine

## 2015-01-11 ENCOUNTER — Telehealth: Payer: Self-pay | Admitting: Family Medicine

## 2015-01-11 ENCOUNTER — Encounter (HOSPITAL_COMMUNITY): Payer: Self-pay | Admitting: *Deleted

## 2015-01-11 ENCOUNTER — Emergency Department (HOSPITAL_COMMUNITY): Payer: Medicare Other

## 2015-01-11 DIAGNOSIS — K589 Irritable bowel syndrome without diarrhea: Secondary | ICD-10-CM | POA: Diagnosis not present

## 2015-01-11 DIAGNOSIS — R0789 Other chest pain: Secondary | ICD-10-CM | POA: Diagnosis present

## 2015-01-11 DIAGNOSIS — R531 Weakness: Secondary | ICD-10-CM | POA: Diagnosis not present

## 2015-01-11 DIAGNOSIS — M5137 Other intervertebral disc degeneration, lumbosacral region: Secondary | ICD-10-CM | POA: Diagnosis not present

## 2015-01-11 DIAGNOSIS — J45909 Unspecified asthma, uncomplicated: Secondary | ICD-10-CM | POA: Diagnosis not present

## 2015-01-11 DIAGNOSIS — Z823 Family history of stroke: Secondary | ICD-10-CM

## 2015-01-11 DIAGNOSIS — I2510 Atherosclerotic heart disease of native coronary artery without angina pectoris: Secondary | ICD-10-CM | POA: Diagnosis not present

## 2015-01-11 DIAGNOSIS — Z79899 Other long term (current) drug therapy: Secondary | ICD-10-CM | POA: Diagnosis not present

## 2015-01-11 DIAGNOSIS — Z85828 Personal history of other malignant neoplasm of skin: Secondary | ICD-10-CM

## 2015-01-11 DIAGNOSIS — R0600 Dyspnea, unspecified: Secondary | ICD-10-CM | POA: Diagnosis not present

## 2015-01-11 DIAGNOSIS — Z886 Allergy status to analgesic agent status: Secondary | ICD-10-CM | POA: Diagnosis not present

## 2015-01-11 DIAGNOSIS — K219 Gastro-esophageal reflux disease without esophagitis: Secondary | ICD-10-CM | POA: Diagnosis present

## 2015-01-11 DIAGNOSIS — Z8249 Family history of ischemic heart disease and other diseases of the circulatory system: Secondary | ICD-10-CM | POA: Diagnosis not present

## 2015-01-11 DIAGNOSIS — R42 Dizziness and giddiness: Secondary | ICD-10-CM | POA: Insufficient documentation

## 2015-01-11 DIAGNOSIS — Z887 Allergy status to serum and vaccine status: Secondary | ICD-10-CM | POA: Diagnosis not present

## 2015-01-11 DIAGNOSIS — J328 Other chronic sinusitis: Secondary | ICD-10-CM | POA: Diagnosis not present

## 2015-01-11 DIAGNOSIS — R079 Chest pain, unspecified: Secondary | ICD-10-CM | POA: Diagnosis not present

## 2015-01-11 DIAGNOSIS — Z881 Allergy status to other antibiotic agents status: Secondary | ICD-10-CM

## 2015-01-11 DIAGNOSIS — I951 Orthostatic hypotension: Principal | ICD-10-CM | POA: Diagnosis present

## 2015-01-11 DIAGNOSIS — Z833 Family history of diabetes mellitus: Secondary | ICD-10-CM

## 2015-01-11 DIAGNOSIS — Z7951 Long term (current) use of inhaled steroids: Secondary | ICD-10-CM

## 2015-01-11 DIAGNOSIS — E785 Hyperlipidemia, unspecified: Secondary | ICD-10-CM | POA: Diagnosis not present

## 2015-01-11 DIAGNOSIS — Z880 Allergy status to penicillin: Secondary | ICD-10-CM | POA: Diagnosis not present

## 2015-01-11 DIAGNOSIS — Z888 Allergy status to other drugs, medicaments and biological substances status: Secondary | ICD-10-CM

## 2015-01-11 DIAGNOSIS — E7800 Pure hypercholesterolemia, unspecified: Secondary | ICD-10-CM | POA: Diagnosis present

## 2015-01-11 DIAGNOSIS — J329 Chronic sinusitis, unspecified: Secondary | ICD-10-CM | POA: Diagnosis present

## 2015-01-11 HISTORY — DX: Other complications of anesthesia, initial encounter: T88.59XA

## 2015-01-11 HISTORY — DX: Adverse effect of unspecified anesthetic, initial encounter: T41.45XA

## 2015-01-11 HISTORY — DX: Other specified postprocedural states: Z98.890

## 2015-01-11 HISTORY — DX: Nausea with vomiting, unspecified: R11.2

## 2015-01-11 LAB — URINALYSIS, ROUTINE W REFLEX MICROSCOPIC
BILIRUBIN URINE: NEGATIVE
Glucose, UA: NEGATIVE mg/dL
Ketones, ur: NEGATIVE mg/dL
Nitrite: NEGATIVE
PH: 5.5 (ref 5.0–8.0)
Protein, ur: NEGATIVE mg/dL
Specific Gravity, Urine: 1.004 — ABNORMAL LOW (ref 1.005–1.030)
UROBILINOGEN UA: 0.2 mg/dL (ref 0.0–1.0)

## 2015-01-11 LAB — CBC WITH DIFFERENTIAL/PLATELET
Basophils Absolute: 0 10*3/uL (ref 0.0–0.1)
Basophils Relative: 1 % (ref 0–1)
EOS PCT: 1 % (ref 0–5)
Eosinophils Absolute: 0.1 10*3/uL (ref 0.0–0.7)
HCT: 37.2 % (ref 36.0–46.0)
Hemoglobin: 12.3 g/dL (ref 12.0–15.0)
Lymphocytes Relative: 25 % (ref 12–46)
Lymphs Abs: 1.8 10*3/uL (ref 0.7–4.0)
MCH: 29.4 pg (ref 26.0–34.0)
MCHC: 33.1 g/dL (ref 30.0–36.0)
MCV: 89 fL (ref 78.0–100.0)
MONO ABS: 0.6 10*3/uL (ref 0.1–1.0)
Monocytes Relative: 8 % (ref 3–12)
Neutro Abs: 4.8 10*3/uL (ref 1.7–7.7)
Neutrophils Relative %: 65 % (ref 43–77)
Platelets: 206 10*3/uL (ref 150–400)
RBC: 4.18 MIL/uL (ref 3.87–5.11)
RDW: 13.9 % (ref 11.5–15.5)
WBC: 7.3 10*3/uL (ref 4.0–10.5)

## 2015-01-11 LAB — MAGNESIUM: Magnesium: 2.3 mg/dL (ref 1.5–2.5)

## 2015-01-11 LAB — COMPREHENSIVE METABOLIC PANEL
ALBUMIN: 3.4 g/dL — AB (ref 3.5–5.2)
ALT: 9 U/L (ref 0–35)
AST: 17 U/L (ref 0–37)
Alkaline Phosphatase: 57 U/L (ref 39–117)
Anion gap: 9 (ref 5–15)
BUN: 21 mg/dL (ref 6–23)
CO2: 25 mmol/L (ref 19–32)
Calcium: 9.2 mg/dL (ref 8.4–10.5)
Chloride: 108 mmol/L (ref 96–112)
Creatinine, Ser: 0.98 mg/dL (ref 0.50–1.10)
GFR calc Af Amer: 57 mL/min — ABNORMAL LOW (ref >90)
GFR, EST NON AFRICAN AMERICAN: 49 mL/min — AB (ref >90)
GLUCOSE: 93 mg/dL (ref 70–99)
Potassium: 4.1 mmol/L (ref 3.5–5.1)
SODIUM: 142 mmol/L (ref 135–145)
Total Bilirubin: 0.9 mg/dL (ref 0.3–1.2)
Total Protein: 6.6 g/dL (ref 6.0–8.3)

## 2015-01-11 LAB — TSH: TSH: 2.069 u[IU]/mL (ref 0.350–4.500)

## 2015-01-11 LAB — I-STAT TROPONIN, ED: Troponin i, poc: 0 ng/mL (ref 0.00–0.08)

## 2015-01-11 LAB — URINE MICROSCOPIC-ADD ON

## 2015-01-11 LAB — TROPONIN I

## 2015-01-11 LAB — BRAIN NATRIURETIC PEPTIDE: B Natriuretic Peptide: 269.1 pg/mL — ABNORMAL HIGH (ref 0.0–100.0)

## 2015-01-11 MED ORDER — ACETAMINOPHEN 325 MG PO TABS: 650.0000 mg | ORAL_TABLET | Freq: Four times a day (QID) | ORAL | Status: DC | PRN

## 2015-01-11 MED ORDER — BECLOMETHASONE DIPROPIONATE 80 MCG/ACT IN AERS: 1.0000 | INHALATION_SPRAY | Freq: Two times a day (BID) | RESPIRATORY_TRACT | Status: DC

## 2015-01-11 MED ORDER — ONDANSETRON HCL 4 MG PO TABS: 4.0000 mg | ORAL_TABLET | Freq: Four times a day (QID) | ORAL | Status: DC | PRN

## 2015-01-11 MED ORDER — SODIUM CHLORIDE 0.9 % IV BOLUS (SEPSIS)
500.0000 mL | Freq: Once | INTRAVENOUS | Status: AC
  Administered 2015-01-11: 500 mL via INTRAVENOUS

## 2015-01-11 MED ORDER — LATANOPROST 0.005 % OP SOLN
1.0000 [drp] | Freq: Every day | OPHTHALMIC | Status: DC
  Filled 2015-01-11: qty 2.5

## 2015-01-11 MED ORDER — ASPIRIN 81 MG PO CHEW: 324.0000 mg | CHEWABLE_TABLET | Freq: Once | ORAL | Status: DC

## 2015-01-11 MED ORDER — FLUTICASONE PROPIONATE HFA 44 MCG/ACT IN AERO
1.0000 | INHALATION_SPRAY | Freq: Two times a day (BID) | RESPIRATORY_TRACT | Status: DC
  Filled 2015-01-11 (×2): qty 10.6

## 2015-01-11 MED ORDER — SODIUM CHLORIDE 0.9 % IJ SOLN: 3.0000 mL | Freq: Two times a day (BID) | INTRAMUSCULAR | Status: DC

## 2015-01-11 MED ORDER — ENOXAPARIN SODIUM 40 MG/0.4ML ~~LOC~~ SOLN
40.0000 mg | SUBCUTANEOUS | Status: DC
  Administered 2015-01-11: 40 mg via SUBCUTANEOUS
  Filled 2015-01-11: qty 0.4

## 2015-01-11 MED ORDER — ACETAMINOPHEN 650 MG RE SUPP: 650.0000 mg | Freq: Four times a day (QID) | RECTAL | Status: DC | PRN

## 2015-01-11 MED ORDER — NON FORMULARY: 40.0000 mg | Freq: Every day | Status: DC

## 2015-01-11 MED ORDER — ONDANSETRON HCL 4 MG/2ML IJ SOLN: 4.0000 mg | Freq: Four times a day (QID) | INTRAMUSCULAR | Status: DC | PRN

## 2015-01-11 MED ORDER — SODIUM CHLORIDE 0.9 % IV SOLN
INTRAVENOUS | Status: DC
  Administered 2015-01-11 – 2015-01-12 (×2): via INTRAVENOUS
  Filled 2015-01-11 (×3): qty 1000

## 2015-01-11 MED ORDER — SODIUM CHLORIDE 0.9 % IV SOLN
Freq: Once | INTRAVENOUS | Status: AC
  Administered 2015-01-11: 13:00:00 via INTRAVENOUS

## 2015-01-11 MED ORDER — ESOMEPRAZOLE MAGNESIUM 40 MG PO CPDR
40.0000 mg | DELAYED_RELEASE_CAPSULE | Freq: Every day | ORAL | Status: DC
  Filled 2015-01-11: qty 1

## 2015-01-11 MED ORDER — ASPIRIN EC 325 MG PO TBEC: 325.0000 mg | DELAYED_RELEASE_TABLET | Freq: Every day | ORAL | Status: DC

## 2015-01-11 NOTE — ED Notes (Signed)
Pt has multiple complaints. Reports head pressure, intermittent blurred vision, intermittent left side chest pains and dizziness for several days.

## 2015-01-11 NOTE — ED Notes (Signed)
Report given to Pod C RN. Pt will be moved to C25. Pt and pt's dtr aware of plan

## 2015-01-11 NOTE — ED Provider Notes (Signed)
CSN: [id number]     Arrival date & time 01/11/15  1016 History   First MD Initiated Contact with Patient 01/11/15 1046     Chief Complaint  Patient presents with  . Dizziness  . Chest Pain     (Consider location/radiation/quality/duration/timing/severity/associated sxs/prior Treatment) HPI Ashlee Reyes is a 81 y.o. female with hx of arthritis, AAA with repair, CAD, asthma, interstitial cystitis, afib, presents to ED with complaint of chest pain and dizziness. Pt states she has been feeling "bad" for the last week. Reports generalized weakness, and not being able to do as much as normally. States yesterday had felt dizzy, describes as "light headed." worse with standing. States she does have hx of vertigo, took meclizine which helped. States chest pain started after going to bed last night, and it woke her up from sleep. Constant since then. Associated with shortness of breath. No diaphoresis. Hx of cardiac cath with moderate disease in 8/10. Pt denies any nausea, vomiting, diarrhea. No urinary symptoms.   Past Medical History  Diagnosis Date  . Osteoarthritis     spine/spinal stenosis  . GERD (gastroesophageal reflux disease)   . Hemorrhoids   . IC (irritable colon)     DMSO in past  . Allergic rhinitis, seasonal   . IBS (irritable bowel syndrome)   . Diverticulosis   . CAD (coronary artery disease) 07/2009    mod by cath 08/10  . AAA (abdominal aortic aneurysm)     followed by Dr Oneida Alar. Last u/s07/10 stable AAA with largest measurement 3.97 x 4.02cm.  . Skin cancer     basal cell cancer - face.  . Osteopenia 2011  . Hyperlipidemia   . Depression   . Asthma   . Interstitial cystitis   . History of recurrent UTIs   . Disc disease, degenerative, lumbar or lumbosacral     SPINAL STENOSIS  . Atrial fibrillation    Past Surgical History  Procedure Laterality Date  . Right colectomy  1997    benign  . Abdominal hysterectomy      partial not cancer  . Cystocele  repair    . Rectocele repair    . Hernia repair  41/2878    umbilical hernia  . Esophagogastroduodenoscopy  09/1998    erosive esphagitis ? stricture treated 12/2002  . Refractive surgery      for film after cataract surgery.  . Skin lesion excision  05/2011    basal cell skin lesion removed.  . Breast surgery      lumpectomy  . Abdominal aortic endovascular stent graft N/A 07/01/2013    Procedure: ABDOMINAL AORTIC ENDOVASCULAR STENT GRAFT- GORE;  Surgeon: Elam Dutch, MD;  Location: [facility] OR;  Service: Vascular;  Laterality: N/A;  Ultrasound guided   Family History  Problem Relation Age of Onset  . Heart disease Mother     deceased age 40 heart attack.  Marland Kitchen Heart attack Mother   . Other Mother     varicose veins  . Varicose Veins Mother   . Heart disease Father     age 2 heart attack  . Cancer Father   . Stroke Brother   . Cancer Brother   . Heart disease Brother   . Heart attack Brother   . Cancer Daughter   . Diabetes Son    History  Substance Use Topics  . Smoking status: Never Smoker   . Smokeless tobacco: Never Used  . Alcohol Use: No   OB History  No data available     Review of Systems  Constitutional: Positive for fatigue. Negative for fever and chills.  Respiratory: Negative for cough, chest tightness and shortness of breath.   Cardiovascular: Positive for chest pain. Negative for palpitations and leg swelling.  Gastrointestinal: Negative for nausea, vomiting, abdominal pain and diarrhea.  Genitourinary: Negative for dysuria and flank pain.  Musculoskeletal: Negative for myalgias, arthralgias, neck pain and neck stiffness.  Skin: Negative for rash.  Neurological: Positive for dizziness, weakness and light-headedness. Negative for headaches.  All other systems reviewed and are negative.     Allergies  Amoxicillin-pot clavulanate; Avelox; Ibuprofen; Keflex; Nitrofurantoin; Omeprazole; Rosuvastatin; Sertraline hcl; and Tetanus toxoids  Home  Medications   Prior to Admission medications   Medication Sig Start Date End Date Taking? Authorizing Provider  acetaminophen (TYLENOL) 500 MG tablet Take 500-1,000 mg by mouth every 4 (four) hours as needed. For pain    Historical Provider, MD  albuterol (PROAIR HFA) 108 (90 BASE) MCG/ACT inhaler Inhale 2 puffs into the lungs every 4 (four) hours as needed for wheezing. 2 puffs up to every 4 hours as needed for wheezing 04/16/11   Tonia Ghent, MD  beclomethasone (QVAR) 80 MCG/ACT inhaler Inhale 1 puff into the lungs 2 (two) times daily. 08/12/13   Abner Greenspan, MD  cetirizine (ZYRTEC) 10 MG tablet Take 10 mg by mouth daily as needed. For allergies    Historical Provider, MD  esomeprazole (NEXIUM) 40 MG capsule Take 40 mg by mouth daily before breakfast.    Historical Provider, MD  latanoprost (XALATAN) 0.005 % ophthalmic solution Place 1 drop into both eyes at bedtime.      Historical Provider, MD  Meth-Hyo-M Bl-Na Phos-Ph Sal (URIBEL) 118 MG CAPS Take by mouth daily.    Historical Provider, MD  Phenazopyridine HCl (AZO TABS PO) Take 97.5 mg by mouth as needed.     Historical Provider, MD  Polyethyl Glycol-Propyl Glycol (SYSTANE OP) Apply to eye as needed.    Historical Provider, MD  polyethylene glycol (MIRALAX / GLYCOLAX) packet Take 17 g by mouth daily as needed. For constipation    Historical Provider, MD   BP 137/61 mmHg  Pulse 69  Temp(Src) 97.6 F (36.4 C) (Oral)  Resp 16  SpO2 100%  LMP 12/17/1968 Physical Exam  Constitutional: She is oriented to person, place, and time. She appears well-developed and well-nourished. No distress.  HENT:  Head: Normocephalic.  Eyes: Conjunctivae and EOM are normal. Pupils are equal, round, and reactive to light.  Neck: Normal range of motion. Neck supple.  Cardiovascular: Normal rate, regular rhythm and normal heart sounds.   Pulmonary/Chest: Effort normal and breath sounds normal. No respiratory distress. She has no wheezes. She has no rales.   Abdominal: Soft. Bowel sounds are normal. She exhibits no distension. There is no tenderness. There is no rebound.  Musculoskeletal: She exhibits no edema.  Neurological: She is alert and oriented to person, place, and time. No cranial nerve deficit. Coordination normal.  5/5 and equal upper and lower extremity strength bilaterally. Equal grip strength bilaterally. Normal finger to nose and heel to shin. No pronator drift.   Skin: Skin is warm and dry.  Psychiatric: She has a normal mood and affect. Her behavior is normal.  Nursing note and vitals reviewed.   ED Course  Procedures (including critical care time) Labs Review Labs Reviewed  COMPREHENSIVE METABOLIC PANEL - Abnormal; Notable for the following:    Albumin 3.4 (*)  GFR calc non Af Amer 49 (*)    GFR calc Af Amer 57 (*)    All other components within normal limits  BRAIN NATRIURETIC PEPTIDE - Abnormal; Notable for the following:    B Natriuretic Peptide 269.1 (*)    All other components within normal limits  URINALYSIS, ROUTINE W REFLEX MICROSCOPIC - Abnormal; Notable for the following:    Specific Gravity, Urine 1.004 (*)    Hgb urine dipstick SMALL (*)    Leukocytes, UA TRACE (*)    All other components within normal limits  URINE MICROSCOPIC-ADD ON - Abnormal; Notable for the following:    Bacteria, UA FEW (*)    All other components within normal limits  CBC WITH DIFFERENTIAL/PLATELET  TROPONIN I  TROPONIN I  TROPONIN I  MAGNESIUM  TSH  I-STAT TROPOININ, ED    Imaging Review Dg Chest 2 View  01/11/2015   CLINICAL DATA:  Chest pain, indigestion  EXAM: CHEST  2 VIEW  COMPARISON:  None.  FINDINGS: There is no focal parenchymal opacity, pleural effusion, or pneumothorax. There is stable cardiomegaly.  There is mild thoracic spine spondylosis.  IMPRESSION: No active cardiopulmonary disease.   Electronically Signed   By: Kathreen Devoid   On: 01/11/2015 12:21     EKG Interpretation   Date/Time:  Tuesday January 11 2015 10:28:39 EST Ventricular Rate:  81 PR Interval:  230 QRS Duration: 80 QT Interval:  400 QTC Calculation: 464 R Axis:   5 Text Interpretation:  Sinus rhythm with 1st degree A-V block with Possible  Premature atrial complexes with Abberant conduction Cannot rule out  Anterior infarct , age undetermined , new with 1st degree A-V block  Confirmed by [facility]  MD, WHITNEY ([id number]) on 01/11/2015 10:52:08 AM      MDM   Final diagnoses:  Chest pain, unspecified chest pain type  Dizziness    Pt with weakness and dizziness. Hx of AAA repair. Moderate CAD by cath 8/10. Will get labs, pt feeling much better at this time.    Pt's labs and ecg all unremarkable. Will admit for cp rule out.   Filed Vitals:   01/11/15 1245 01/11/15 1315 01/11/15 1330 01/11/15 1400  BP: 158/65 140/61 157/70 157/75  Pulse: 73 72  70  Temp:      TempSrc:      Resp: 24 13  16   SpO2: 96% 98%  100%       Renold Genta, PA-C 01/11/15 [place], MD 01/11/15 1641

## 2015-01-11 NOTE — Telephone Encounter (Signed)
[facility] Patient Name: Ashlee Reyes DOB: 1921/03/31 Initial Comment Caller states mother was having blurred vision and discomfort in chest , dizzy, lightheaded Nurse Assessment Nurse: [name], RN, [name] Date/Time (Eastern Time): 01/11/2015 9:01:39 AM Confirm and document reason for call. If symptomatic, describe symptoms. ---Caller states her Mother has been having blurred vision off and on over the past few days ( not at this time though ) . She is complaining of chest discomfort that woke her up, and is feeling dizzy and lightheaded. Has the patient traveled out of the country within the last 30 days? ---Not Applicable Does the patient require triage? ---Yes Related visit to physician within the last 2 weeks? ---No Does the PT have any chronic conditions? (i.e. diabetes, asthma, etc.) ---Yes List chronic conditions. ---Asthma, Aortic Aneurysm repair 2014 Guidelines Guideline Title Affirmed Question Affirmed Notes Chest Pain Dizziness or lightheadedness Final Disposition User Go to ED Now [name], RN, [name] Comments Caller reports that she will call for ambulance transport since she cannot get out because of the ice.

## 2015-01-11 NOTE — Progress Notes (Signed)
  Echocardiogram 2D Echocardiogram has been performed.  Ashlee Reyes 01/11/2015, 3:54 PM

## 2015-01-11 NOTE — ED Notes (Signed)
Echo at bedside

## 2015-01-11 NOTE — ED Notes (Signed)
Meal ordered for pt 

## 2015-01-11 NOTE — H&P (Signed)
History and Physical  Ashlee Reyes IFO:[id number] DOB: 03-24-1921 DOA: 01/11/2015   PCP: Loura Pardon, MD   Chief Complaint: chest discomfort, dizziness  HPI:  81 year old female with a history of  abdominal aortic aneurysm s/p repair, irritable bowel syndrome, allergic rhinitis, and GERD presents with 1 day history of chest discomfort that woke her up from sleep. The patient went to take some Tums with some relief and she went back to sleep. She has not had a recurrence of her chest discomfort. In the morning, when she will contacted her primary care physician, she was told to come to the emergency department for further evaluation. Notably, the patient has been complaining of generalized weakness and tiredness for the past week when performing her activities of daily living. In addition, the patient has been having some dizziness and lightheadedness that occurs with change in position as well as at rest. She denies syncope or falls. She denies any recent fevers, chills, nausea, vomiting, diarrhea. She states that she has been eating well. She denies any headaches, visual disturbance, abdominal pain, dysuria, hematuria.  In November 2014, the patient had a syncopal episode. Workup at that time was unremarkable. She was discharged home with a 30 day event monitor which did not show any dysrhythmias. The patient denies any new medications. In the emergency department, the patient's orthostatic vital signs were positive. She was afebrile and hemodynamically stable. BMP and CBC were unremarkable. Hepatic enzymes were unremarkable. Urinalysis was negative for any pyuria. EKG showed sinus rhythm with first-degree AV block with nonspecific T-wave changes. Point of care troponin was negative. Chest x-ray was negative Assessment/Plan: Orthostatic hypotension -This may have been contributing to the patient's dizziness and tiredness/general weakness -Start IV fluids -Check echocardiogram Atypical  chest pain -Somewhat relieved by Tums -EKG without concerning ischemic changes -cardiology eval if echo shows significant changes or new abnormalities Generalized weakness -TSH -am cortisol -PT evaluation -Urinalysis negative for pyuria -Patient's hemoglobin is at baseline GERD -Patient's daughter is adamant that she take her home Nexium AAA -s/p repair by Dr. Oneida Alar    Past Medical History  Diagnosis Date  . Osteoarthritis     spine/spinal stenosis  . GERD (gastroesophageal reflux disease)   . Hemorrhoids   . IC (irritable colon)     DMSO in past  . Allergic rhinitis, seasonal   . IBS (irritable bowel syndrome)   . Diverticulosis   . CAD (coronary artery disease) 07/2009    mod by cath 08/10  . AAA (abdominal aortic aneurysm)     followed by Dr Oneida Alar. Last u/s07/10 stable AAA with largest measurement 3.97 x 4.02cm.  . Skin cancer     basal cell cancer - face.  . Osteopenia 2011  . Hyperlipidemia   . Depression   . Asthma   . Interstitial cystitis   . History of recurrent UTIs   . Disc disease, degenerative, lumbar or lumbosacral     SPINAL STENOSIS  . Atrial fibrillation    Past Surgical History  Procedure Laterality Date  . Right colectomy  1997    benign  . Abdominal hysterectomy      partial not cancer  . Cystocele repair    . Rectocele repair    . Hernia repair  67/6720    umbilical hernia  . Esophagogastroduodenoscopy  09/1998    erosive esphagitis ? stricture treated 12/2002  . Refractive surgery      for film after cataract surgery.  . Skin  lesion excision  05/2011    basal cell skin lesion removed.  . Breast surgery      lumpectomy  . Abdominal aortic endovascular stent graft N/A 07/01/2013    Procedure: ABDOMINAL AORTIC ENDOVASCULAR STENT GRAFT- GORE;  Surgeon: Elam Dutch, MD;  Location: [place];  Service: Vascular;  Laterality: N/A;  Ultrasound guided   Social History:  reports that she has never smoked. She has never used smokeless  tobacco. She reports that she does not drink alcohol or use illicit drugs.   Family History  Problem Relation Age of Onset  . Heart disease Mother     deceased age 9 heart attack.  Marland Kitchen Heart attack Mother   . Other Mother     varicose veins  . Varicose Veins Mother   . Heart disease Father     age 80 heart attack  . Cancer Father   . Stroke Brother   . Cancer Brother   . Heart disease Brother   . Heart attack Brother   . Cancer Daughter   . Diabetes Son      Allergies  Allergen Reactions  . Amoxicillin-Pot Clavulanate     REACTION: sick  . Avelox [Moxifloxacin Hcl In Nacl] Nausea Only  . Ibuprofen     REACTION: GI  . Keflex [Cephalexin]     abd pain / GI upset  . Nitrofurantoin Other (See Comments)    Chest pain or indigestion with nausea  . Omeprazole Nausea Only  . Rosuvastatin     REACTION: foot pain  . Sertraline Hcl     REACTION: more depressed  . Tetanus Toxoids     Local redness and swelling       Prior to Admission medications   Medication Sig Start Date End Date Taking? Authorizing Provider  acetaminophen (TYLENOL) 500 MG tablet Take 500-1,000 mg by mouth every 4 (four) hours as needed. For pain    Historical Provider, MD  albuterol (PROAIR HFA) 108 (90 BASE) MCG/ACT inhaler Inhale 2 puffs into the lungs every 4 (four) hours as needed for wheezing. 2 puffs up to every 4 hours as needed for wheezing 04/16/11   Tonia Ghent, MD  beclomethasone (QVAR) 80 MCG/ACT inhaler Inhale 1 puff into the lungs 2 (two) times daily. 08/12/13   Abner Greenspan, MD  cetirizine (ZYRTEC) 10 MG tablet Take 10 mg by mouth daily as needed. For allergies    Historical Provider, MD  esomeprazole (NEXIUM) 40 MG capsule Take 40 mg by mouth daily before breakfast.    Historical Provider, MD  latanoprost (XALATAN) 0.005 % ophthalmic solution Place 1 drop into both eyes at bedtime.      Historical Provider, MD  Meth-Hyo-M Bl-Na Phos-Ph Sal (URIBEL) 118 MG CAPS Take by mouth daily.     Historical Provider, MD  Phenazopyridine HCl (AZO TABS PO) Take 97.5 mg by mouth as needed.     Historical Provider, MD  Polyethyl Glycol-Propyl Glycol (SYSTANE OP) Apply to eye as needed.    Historical Provider, MD  polyethylene glycol (MIRALAX / GLYCOLAX) packet Take 17 g by mouth daily as needed. For constipation    Historical Provider, MD    Review of Systems:  Constitutional:  No weight loss, night sweats, Fevers, chills Head&Eyes No headache.  No vision loss.  No eye pain or scotoma ENT:  No Difficulty swallowing,Tooth/dental problems,Sore throat,  Cardio-vascular:  No  Orthopnea, PND, swelling in lower extremities,  dizziness, palpitations  GI:  No  abdominal pain,  nausea, vomiting, diarrhea, loss of appetite, hematochezia, melena, heartburn, indigestion, Resp:  No shortness of breath with exertion or at rest. No cough. No coughing up of blood .No wheezing.No chest wall deformity  Skin:  no rash or lesions.  GU:  no dysuria, change in color of urine, no urgency or frequency. No flank pain.  Musculoskeletal:  No joint pain or swelling. No decreased range of motion. No back pain.  Psych:  No change in mood or affect. No depression or anxiety. Neurologic: No headache, no dysesthesia, no focal weakness, no vision loss. No syncope  Physical Exam: Filed Vitals:   01/11/15 1216 01/11/15 1245 01/11/15 1315 01/11/15 1330  BP: 158/64 158/65 140/61 157/70  Pulse: 70 73 72   Temp:      TempSrc:      Resp: 16 24 13    SpO2: 99% 96% 98%    General:  A&O x 3, NAD, nontoxic, pleasant/cooperative Head/Eye: No conjunctival hemorrhage, no icterus, [facility]/AT, No nystagmus ENT:  No icterus,  No thrush, good dentition, no pharyngeal exudate Neck:  No masses, no lymphadenpathy, no bruits CV:  RRR, no rub, no gallop, no S3 Lung:  CTAB, good air movement, no wheeze, no rhonchi Abdomen: soft/NT, +BS, nondistended, no peritoneal signs Ext: No cyanosis, No rashes, No petechiae, No lymphangitis,  No edema Neuro: CNII-XII intact, strength 4/5 in bilateral upper and lower extremities, no dysmetria  Labs on Admission:  Basic Metabolic Panel:  Recent Labs Lab 01/11/15 1129  NA 142  K 4.1  CL 108  CO2 25  GLUCOSE 93  BUN 21  CREATININE 0.98  CALCIUM 9.2   Liver Function Tests:  Recent Labs Lab 01/11/15 1129  AST 17  ALT 9  ALKPHOS 57  BILITOT 0.9  PROT 6.6  ALBUMIN 3.4*   No results for input(s): LIPASE, AMYLASE in the last 168 hours. No results for input(s): AMMONIA in the last 168 hours. CBC:  Recent Labs Lab 01/11/15 1129  WBC 7.3  NEUTROABS 4.8  HGB 12.3  HCT 37.2  MCV 89.0  PLT 206   Cardiac Enzymes: No results for input(s): CKTOTAL, CKMB, CKMBINDEX, TROPONINI in the last 168 hours. BNP: Invalid input(s): POCBNP CBG: No results for input(s): GLUCAP in the last 168 hours.  Radiological Exams on Admission: Dg Chest 2 View  01/11/2015   CLINICAL DATA:  Chest pain, indigestion  EXAM: CHEST  2 VIEW  COMPARISON:  None.  FINDINGS: There is no focal parenchymal opacity, pleural effusion, or pneumothorax. There is stable cardiomegaly.  There is mild thoracic spine spondylosis.  IMPRESSION: No active cardiopulmonary disease.   Electronically Signed   By: Kathreen Devoid   On: 01/11/2015 12:21    EKG: Independently reviewed. Sinus with nonspecific T-wave changes    Time spent:70 minutes Code Status:   FULL Family Communication:  Daughter at bedside   Odeth Bry, DO  [facility] Pager [phone]  If 7PM-7AM, please contact night-coverage [email or web address] Password [email or web address] 01/11/2015, 1:54 PM

## 2015-01-11 NOTE — Telephone Encounter (Signed)
Agree with adv to call EMS  Will watch for ER notes

## 2015-01-11 NOTE — ED Notes (Signed)
Patient's daughter requests that medications administered will only be from home (has with her). Daughter does not want any medications given to her mother that she already has.

## 2015-01-12 DIAGNOSIS — J329 Chronic sinusitis, unspecified: Secondary | ICD-10-CM | POA: Diagnosis present

## 2015-01-12 DIAGNOSIS — R0789 Other chest pain: Secondary | ICD-10-CM | POA: Diagnosis not present

## 2015-01-12 DIAGNOSIS — K219 Gastro-esophageal reflux disease without esophagitis: Secondary | ICD-10-CM | POA: Diagnosis not present

## 2015-01-12 DIAGNOSIS — E785 Hyperlipidemia, unspecified: Secondary | ICD-10-CM | POA: Diagnosis not present

## 2015-01-12 DIAGNOSIS — R42 Dizziness and giddiness: Secondary | ICD-10-CM

## 2015-01-12 DIAGNOSIS — K589 Irritable bowel syndrome without diarrhea: Secondary | ICD-10-CM | POA: Diagnosis not present

## 2015-01-12 DIAGNOSIS — I2510 Atherosclerotic heart disease of native coronary artery without angina pectoris: Secondary | ICD-10-CM | POA: Diagnosis not present

## 2015-01-12 DIAGNOSIS — I951 Orthostatic hypotension: Secondary | ICD-10-CM | POA: Diagnosis not present

## 2015-01-12 LAB — LIPID PANEL
Cholesterol: 242 mg/dL — ABNORMAL HIGH (ref 0–200)
HDL: 39 mg/dL — ABNORMAL LOW (ref >39)
LDL CALC: 170 mg/dL — AB (ref 0–99)
TRIGLYCERIDES: 167 mg/dL — AB (ref <150)
Total CHOL/HDL Ratio: 6.2 RATIO
VLDL: 33 mg/dL (ref 0–40)

## 2015-01-12 LAB — BASIC METABOLIC PANEL
ANION GAP: 5 (ref 5–15)
BUN: 16 mg/dL (ref 6–23)
CHLORIDE: 111 mmol/L (ref 96–112)
CO2: 25 mmol/L (ref 19–32)
Calcium: 8.7 mg/dL (ref 8.4–10.5)
Creatinine, Ser: 0.92 mg/dL (ref 0.50–1.10)
GFR calc non Af Amer: 53 mL/min — ABNORMAL LOW (ref >90)
GFR, EST AFRICAN AMERICAN: 61 mL/min — AB (ref >90)
Glucose, Bld: 98 mg/dL (ref 70–99)
Potassium: 3.9 mmol/L (ref 3.5–5.1)
Sodium: 141 mmol/L (ref 135–145)

## 2015-01-12 LAB — TROPONIN I: Troponin I: <0.03 ng/mL (ref <0.031)

## 2015-01-12 MED ORDER — FLUTICASONE PROPIONATE 50 MCG/ACT NA SUSP: 2.0000 | Freq: Every day | NASAL | Status: DC

## 2015-01-12 MED ORDER — HYOSCYAMINE SULFATE 0.125 MG PO TABS: 0.1250 mg | ORAL_TABLET | ORAL | Status: DC | PRN

## 2015-01-12 NOTE — Discharge Summary (Signed)
Physician Discharge Summary  Ashlee Reyes WYO:[id number] DOB: 11-20-1921 DOA: 01/11/2015  PCP: Loura Pardon, MD  Admit date: 01/11/2015 Discharge date: 01/12/2015   Recommendations for Outpatient Follow-up:  1. Home PT arranged  Discharge Diagnoses:  Principal Problem:   Atypical chest pain Active Problems:   Hyperlipidemia   Orthostatic hypotension   Sinusitis, chronic   Orthostatic dizziness   IBS (irritable bowel syndrome)   Discharge Condition: stable  Filed Weights   01/11/15 1634 01/12/15 0439  Weight: 75.297 kg (166 lb) 72.394 kg (159 lb 9.6 oz)    History of present illness:  81 year old female with a history of abdominal aortic aneurysm s/p repair, irritable bowel syndrome, allergic rhinitis, and GERD presents with 1 day history of chest discomfort that woke her up from sleep. The patient went to take some Tums with some relief and she went back to sleep. She has not had a recurrence of her chest discomfort. In the morning, when she will contacted her primary care physician, she was told to come to the emergency department for further evaluation. Notably, the patient has been complaining of generalized weakness and tiredness for the past week when performing her activities of daily living. In addition, the patient has been having some dizziness and lightheadedness that occurs with change in position as well as at rest. She denies syncope or falls. She denies any recent fevers, chills, nausea, vomiting, diarrhea. She states that she has been eating well. She denies any headaches, visual disturbance, abdominal pain, dysuria, hematuria.  In November 2014, the patient had a syncopal episode. Workup at that time was unremarkable. She was discharged home with a 30 day event monitor which did not show any dysrhythmias. The patient denies any new medications. In the emergency department, the patient's orthostatic vital signs were positive. She was afebrile and hemodynamically  stable. BMP and CBC were unremarkable. Hepatic enzymes were unremarkable. Urinalysis was negative for any pyuria. EKG showed sinus rhythm with first-degree AV block with nonspecific T-wave changes. Point of care troponin was negative. Chest x-ray was negative  Hospital Course:  Admitted to telemetry. MI ruled out. Orthostatic vital signs positive. Started on saline infusion. Worked with PT.  Orthostatic vital signs corrected.  Echo showed normal EF.  Upon further questioning, patient complained of fatigue, post nasal drip, sinus pressure.  Recommend nasal steroid at discharge.   Procedures:none  Consultations:  none  Discharge Exam: Filed Vitals:   01/12/15 0439  BP: 113/55  Pulse: 69  Temp: 98.3 F (36.8 C)  Resp: 18    General: comfortabe HEENT: mucous drainage noted in OP Cardiovascular: RRR Respiratory: CTA  Discharge Instructions   Discharge Instructions    Diet general    Complete by:  As directed      Discharge instructions    Complete by:  As directed   Drink plenty of fluids     Increase activity slowly    Complete by:  As directed           Current Discharge Medication List    START taking these medications   Details  fluticasone (FLONASE) 50 MCG/ACT nasal spray Place 2 sprays into both nostrils daily. Qty: 16 g, Refills: 0    hyoscyamine (LEVSIN) 0.125 MG tablet Take 1 tablet (0.125 mg total) by mouth every 4 (four) hours as needed for cramping. Qty: 30 tablet, Refills: 0      CONTINUE these medications which have NOT CHANGED   Details  acetaminophen (TYLENOL) 500 MG tablet Take  500 mg by mouth every 4 (four) hours as needed for mild pain or moderate pain. For pain    albuterol (PROAIR HFA) 108 (90 BASE) MCG/ACT inhaler Inhale 2 puffs into the lungs every 4 (four) hours as needed for wheezing. 2 puffs up to every 4 hours as needed for wheezing Qty: 1 Inhaler, Refills: 11    beclomethasone (QVAR) 80 MCG/ACT inhaler Inhale 1 puff into the lungs 2  (two) times daily. Qty: 1 Inhaler, Refills: 11    cetirizine (ZYRTEC) 10 MG tablet Take 10 mg by mouth daily as needed. For allergies    esomeprazole (NEXIUM) 40 MG capsule Take 40 mg by mouth daily before breakfast.    latanoprost (XALATAN) 0.005 % ophthalmic solution Place 1 drop into both eyes at bedtime.      Meth-Hyo-M Bl-Na Phos-Ph Sal (URIBEL) 118 MG CAPS Take 1 capsule by mouth daily as needed (urination frequency).     Polyethyl Glycol-Propyl Glycol (SYSTANE OP) Place 1 drop into both eyes as needed (dry eyes).     polyethylene glycol (MIRALAX / GLYCOLAX) packet Take 17 g by mouth daily as needed. For constipation       Allergies  Allergen Reactions  . Amoxicillin-Pot Clavulanate     REACTION: sick  . Avelox [Moxifloxacin Hcl In Nacl] Nausea Only  . Ibuprofen     REACTION: GI  . Keflex [Cephalexin]     abd pain / GI upset  . Nitrofurantoin Other (See Comments)    Chest pain or indigestion with nausea  . Omeprazole Nausea Only  . Rosuvastatin     REACTION: foot pain  . Sertraline Hcl     REACTION: more depressed  . Tetanus Toxoids     Local redness and swelling    Follow-up Information    Follow up with Loura Pardon, MD In 3 weeks.   Specialties:  Family Medicine, Radiology   Contact information:   [address] [place] [address] [phone]        The results of significant diagnostics from this hospitalization (including imaging, microbiology, ancillary and laboratory) are listed below for reference.    Significant Diagnostic Studies: Dg Chest 2 View  01/11/2015   CLINICAL DATA:  Chest pain, indigestion  EXAM: CHEST  2 VIEW  COMPARISON:  None.  FINDINGS: There is no focal parenchymal opacity, pleural effusion, or pneumothorax. There is stable cardiomegaly.  There is mild thoracic spine spondylosis.  IMPRESSION: No active cardiopulmonary disease.   Electronically Signed   By: Kathreen Devoid   On: 01/11/2015 12:21    Echo - Left ventricle: The cavity size was normal. Wall thickness was increased in a pattern of mild LVH. Systolic function was normal. The estimated ejection fraction was in the range of 60% to 65%. Wall motion was normal; there were no regional wall motion abnormalities. Doppler parameters are consistent with abnormal left ventricular relaxation (grade 1 diastolic dysfunction). Doppler parameters are consistent with high ventricular filling pressure. - Aortic valve: There was trivial regurgitation. - Aortic root: The aortic root was mildly dilated. - Mitral valve: Calcified annulus. Mildly thickened leaflets . - Left atrium: The atrium was mildly dilated.  Impressions:  - Normal LV function; grade 1 diastolic dysfunction; mild LAE; trace MR and TR.  Microbiology: No results found for this or any previous visit (from the past 240 hour(s)).   Labs: Basic Metabolic Panel:  Recent Labs Lab 01/11/15 1129 01/11/15 1429 01/12/15 0154  NA 142  --  141  K 4.1  --  3.9  CL 108  --  111  CO2 25  --  25  GLUCOSE 93  --  98  BUN 21  --  16  CREATININE 0.98  --  0.92  CALCIUM 9.2  --  8.7  MG  --  2.3  --    Liver Function Tests:  Recent Labs Lab 01/11/15 1129  AST 17  ALT 9  ALKPHOS 57  BILITOT 0.9  PROT 6.6  ALBUMIN 3.4*   No results for input(s): LIPASE, AMYLASE in the last 168 hours. No results for input(s): AMMONIA in the last 168 hours. CBC:  Recent Labs Lab 01/11/15 1129  WBC 7.3  NEUTROABS 4.8  HGB 12.3  HCT 37.2  MCV 89.0  PLT 206   Cardiac Enzymes:  Recent Labs Lab 01/11/15 1429 01/11/15 1950 01/12/15 0154  TROPONINI <0.03 <0.03 <0.03   BNP: BNP (last 3 results) No results for input(s): PROBNP in the last 8760 hours. CBG: No results for input(s): GLUCAP in the last 168 hours.     SignedDelfina Redwood  [facility] 01/12/2015, 11:10 AM

## 2015-01-12 NOTE — Evaluation (Addendum)
Physical Therapy Evaluation and D/C Patient Details Name: Ashlee Reyes MRN: [id number] DOB: Mar 12, 1921 Today's Date: 01/12/2015   History of Present Illness  Pt admit with orthostatic hypotension.    Clinical Impression  Pt admitted with above diagnosis. Pt currently with functional limitations due to the deficits listed below (see PT Problem List). Pt is discharging today but would benefit from HHPT to increase her independence and safety in the home.  Pt needs to use her RW at all times at home and pt and daughter agree.      Follow Up Recommendations Home health PT;Supervision - Intermittent    Equipment Recommendations  None recommended by PT    Recommendations for Other Services       Precautions / Restrictions Precautions Precautions: Fall Restrictions Weight Bearing Restrictions: No      Mobility  Bed Mobility Overal bed mobility: Independent                Transfers Overall transfer level: Independent                  Ambulation/Gait Ambulation/Gait assistance: Min guard Ambulation Distance (Feet): 200 Feet Assistive device: None Gait Pattern/deviations: Step-through pattern;Decreased stride length   Gait velocity interpretation: Below normal speed for age/gender General Gait Details: Pt ambulated with slightly unsteady gait but did not lose balance at all with ambulation. Did not challenge pt with balance due to unsteadiness as well as pt c/o dizziness.  Recommended use of RW for safety at home and pt and daughter agree.     Stairs            Wheelchair Mobility    Modified Rankin (Stroke Patients Only)       Balance Overall balance assessment: Needs assistance;History of Falls         Standing balance support: No upper extremity supported;During functional activity Standing balance-Leahy Scale: Fair Standing balance comment: Pt can stand statically and maintain balance.  Washed hands at sink.  Pulled Depends off and on  when in bathroom.               High level balance activites: Turns;Sudden stops;Direction changes High Level Balance Comments: All of above with min guard assist for safety.             Pertinent Vitals/Pain Pain Assessment: No/denies pain  Orthostatic BPs  Supine 112/50, 70 bpm  Sitting 123/56, 74 bpm  Standing 118/41, 82 bpm  Standing after 3 min 120/53, 82 bpm  136/95 and 74bpm upon departure from room. 95% O2 on RA    Home Living Family/patient expects to be discharged to:: Private residence Living Arrangements: Children Available Help at Discharge: Family;Available PRN/intermittently Type of Home: House Home Access: Ramped entrance;Stairs to enter Entrance Stairs-Rails: Right;Left;Can reach both Entrance Stairs-Number of Steps: 4 Home Layout: One level Home Equipment: Walker - standard;Cane - quad;Bedside commode Additional Comments: Pt's house is nearby daughter's so she states she will check on her house at times.  She drives very little now.  Daughter is caregiver for her 2 brothers (one is in A living and one is in [facility]) as well as for her 2 aunts that live across the street as well as for her mother.      Prior Function Level of Independence: Independent with assistive device(s);Independent         Comments: Pt states she would use cane when she didn't feel well.  Was independent with bathing and dressing.  Hand Dominance        Extremity/Trunk Assessment   Upper Extremity Assessment: Defer to OT evaluation           Lower Extremity Assessment: Generalized weakness      Cervical / Trunk Assessment: Normal  Communication   Communication: No difficulties  Cognition Arousal/Alertness: Awake/alert Behavior During Therapy: WFL for tasks assessed/performed Overall Cognitive Status: Within Functional Limits for tasks assessed                      General Comments      Exercises        Assessment/Plan    PT  Assessment Patient needs continued PT services  PT Diagnosis Generalized weakness   PT Problem List Decreased activity tolerance;Decreased balance;Decreased mobility;Decreased knowledge of use of DME;Decreased safety awareness;Decreased knowledge of precautions  PT Treatment Interventions DME instruction;Gait training;Functional mobility training;Therapeutic activities;Therapeutic exercise;Balance training;Patient/family education   PT Goals (Current goals can be found in the Care Plan section)      Frequency     Barriers to discharge        Co-evaluation               End of Session Equipment Utilized During Treatment: Gait belt Activity Tolerance: Patient limited by fatigue Patient left: in chair;with call bell/phone within reach;with family/visitor present Nurse Communication: Mobility status    Functional Assessment Tool Used: clinical judgment Functional Limitation: Mobility: Walking and moving around Mobility: Walking and Moving Around Current Status (A8341): At least 1 percent but less than 20 percent impaired, limited or restricted Mobility: Walking and Moving Around Goal Status [phone]): At least 1 percent but less than 20 percent impaired, limited or restricted Mobility: Walking and Moving Around Discharge Status [phone]): At least 1 percent but less than 20 percent impaired, limited or restricted    Time: 1023-1055 PT Time Calculation (min) (ACUTE ONLY): 32 min   Charges:   PT Evaluation $Initial PT Evaluation Tier I: 1 Procedure PT Treatments $Gait Training: 8-22 mins   PT G Codes:   PT G-Codes **NOT FOR INPATIENT CLASS** Functional Assessment Tool Used: clinical judgment Functional Limitation: Mobility: Walking and moving around Mobility: Walking and Moving Around Current Status (Q1194): At least 1 percent but less than 20 percent impaired, limited or restricted Mobility: Walking and Moving Around Goal Status [phone]): At least 1 percent but less than 20  percent impaired, limited or restricted Mobility: Walking and Moving Around Discharge Status [phone]): At least 1 percent but less than 20 percent impaired, limited or restricted    Denice Paradise 01/12/2015, 1:02 PM Sarkis Rhines,PT Acute Rehabilitation [phone] (pager)

## 2015-01-12 NOTE — Care Management Note (Signed)
    Page 1 of 1   01/12/2015     12:00:50 PM CARE MANAGEMENT NOTE 01/12/2015  Patient:  ANNALISIA, INGBER   Account Number:  [id number]  Date Initiated:  01/12/2015  Documentation initiated by:  GRAVES-BIGELOW,Corene Resnick  Subjective/Objective Assessment:   Pt admitted for cp and hypotension. Pt has support of dauhgter.     Action/Plan:   Plan for pt to be d/c today with [facility] services. Choice offered to pt and CM did make referral with [facility] for services and SOC to begin within 24-48 hrs post d/c.   Anticipated DC Date:  01/12/2015   Anticipated DC Plan:  [place]  CM consult      [facility] Choice  HOME HEALTH   Choice offered to / List presented to:  C-1 Patient        [place] arranged  HH-1 RN  [place].   Status of service:  Completed, signed off Medicare Important Message given?  NA - LOS <3 / Initial given by admissions (If response is "NO", the following Medicare IM given date fields will be blank) Date Medicare IM given:   Medicare IM given by:   Date Additional Medicare IM given:   Additional Medicare IM given by:    Discharge Disposition:  [place]  Per UR Regulation:  Reviewed for med. necessity/level of care/duration of stay  If discussed at [place] of Stay Meetings, dates discussed:    Comments:

## 2015-01-13 LAB — CORTISOL-AM, BLOOD: Cortisol - AM: 5.9 ug/dL (ref 4.3–22.4)

## 2015-01-14 DIAGNOSIS — M4806 Spinal stenosis, lumbar region: Secondary | ICD-10-CM | POA: Diagnosis not present

## 2015-01-14 DIAGNOSIS — K5790 Diverticulosis of intestine, part unspecified, without perforation or abscess without bleeding: Secondary | ICD-10-CM | POA: Diagnosis not present

## 2015-01-14 DIAGNOSIS — F329 Major depressive disorder, single episode, unspecified: Secondary | ICD-10-CM | POA: Diagnosis not present

## 2015-01-14 DIAGNOSIS — I4891 Unspecified atrial fibrillation: Secondary | ICD-10-CM | POA: Diagnosis not present

## 2015-01-14 DIAGNOSIS — I959 Hypotension, unspecified: Secondary | ICD-10-CM | POA: Diagnosis not present

## 2015-01-14 DIAGNOSIS — I2510 Atherosclerotic heart disease of native coronary artery without angina pectoris: Secondary | ICD-10-CM | POA: Diagnosis not present

## 2015-01-14 DIAGNOSIS — K219 Gastro-esophageal reflux disease without esophagitis: Secondary | ICD-10-CM | POA: Diagnosis not present

## 2015-01-17 DIAGNOSIS — I2510 Atherosclerotic heart disease of native coronary artery without angina pectoris: Secondary | ICD-10-CM | POA: Diagnosis not present

## 2015-01-17 DIAGNOSIS — K5790 Diverticulosis of intestine, part unspecified, without perforation or abscess without bleeding: Secondary | ICD-10-CM | POA: Diagnosis not present

## 2015-01-17 DIAGNOSIS — K219 Gastro-esophageal reflux disease without esophagitis: Secondary | ICD-10-CM | POA: Diagnosis not present

## 2015-01-17 DIAGNOSIS — I959 Hypotension, unspecified: Secondary | ICD-10-CM | POA: Diagnosis not present

## 2015-01-17 DIAGNOSIS — I4891 Unspecified atrial fibrillation: Secondary | ICD-10-CM | POA: Diagnosis not present

## 2015-01-17 DIAGNOSIS — M4806 Spinal stenosis, lumbar region: Secondary | ICD-10-CM | POA: Diagnosis not present

## 2015-01-18 ENCOUNTER — Telehealth: Payer: Self-pay

## 2015-01-18 DIAGNOSIS — J370 Chronic laryngitis: Secondary | ICD-10-CM | POA: Diagnosis not present

## 2015-01-18 DIAGNOSIS — J4540 Moderate persistent asthma, uncomplicated: Secondary | ICD-10-CM | POA: Diagnosis not present

## 2015-01-18 DIAGNOSIS — J309 Allergic rhinitis, unspecified: Secondary | ICD-10-CM | POA: Diagnosis not present

## 2015-01-18 NOTE — Telephone Encounter (Signed)
Called Santiago Glad with [facility] and notified her of Dr Marliss Coots comments.

## 2015-01-18 NOTE — Telephone Encounter (Signed)
Yes I can do that 

## 2015-01-18 NOTE — Telephone Encounter (Signed)
  Santiago Glad with [facility] [facility] wants to know if Dr Glori Bickers is the attending and following pts home health [place] request cb.No orders needed at this time.

## 2015-01-20 DIAGNOSIS — M4806 Spinal stenosis, lumbar region: Secondary | ICD-10-CM | POA: Diagnosis not present

## 2015-01-20 DIAGNOSIS — K5790 Diverticulosis of intestine, part unspecified, without perforation or abscess without bleeding: Secondary | ICD-10-CM | POA: Diagnosis not present

## 2015-01-20 DIAGNOSIS — I959 Hypotension, unspecified: Secondary | ICD-10-CM | POA: Diagnosis not present

## 2015-01-20 DIAGNOSIS — I4891 Unspecified atrial fibrillation: Secondary | ICD-10-CM | POA: Diagnosis not present

## 2015-01-20 DIAGNOSIS — I2510 Atherosclerotic heart disease of native coronary artery without angina pectoris: Secondary | ICD-10-CM | POA: Diagnosis not present

## 2015-01-20 DIAGNOSIS — K219 Gastro-esophageal reflux disease without esophagitis: Secondary | ICD-10-CM | POA: Diagnosis not present

## 2015-01-21 ENCOUNTER — Ambulatory Visit (INDEPENDENT_AMBULATORY_CARE_PROVIDER_SITE_OTHER): Payer: Medicare Other | Admitting: Family Medicine

## 2015-01-21 ENCOUNTER — Encounter: Payer: Self-pay | Admitting: Family Medicine

## 2015-01-21 VITALS — BP 150/76 | HR 52 | Temp 98.0°F | Ht 66.0 in | Wt 151.8 lb

## 2015-01-21 DIAGNOSIS — R0789 Other chest pain: Secondary | ICD-10-CM

## 2015-01-21 DIAGNOSIS — R42 Dizziness and giddiness: Secondary | ICD-10-CM | POA: Diagnosis not present

## 2015-01-21 DIAGNOSIS — K2970 Gastritis, unspecified, without bleeding: Secondary | ICD-10-CM

## 2015-01-21 DIAGNOSIS — R002 Palpitations: Secondary | ICD-10-CM

## 2015-01-21 MED ORDER — ESOMEPRAZOLE MAGNESIUM 40 MG PO CPDR: 40.0000 mg | DELAYED_RELEASE_CAPSULE | Freq: Two times a day (BID) | ORAL | Status: DC

## 2015-01-21 NOTE — Progress Notes (Signed)
Pre visit review using our clinic review tool, if applicable. No additional management support is needed unless otherwise documented below in the visit note. 

## 2015-01-21 NOTE — Patient Instructions (Addendum)
Increase nexium to twice daily  Continue the zantac (ranitidine) If this does not help we need to get you back to GI  Stop at check out for referral to cardiology for palpitations   If symptoms worsen or dizziness returns or diarrhea returns please let me know   Blood pressure is higher -we will watch this

## 2015-01-21 NOTE — Progress Notes (Signed)
Subjective:    Patient ID: Ashlee Reyes, female    DOB: [birth year], 81 y.o.   MRN: [id number]  HPI Here for f/u of hosp 1/26-1/27  She is feeling some better  PT at home - ? Helps her weakness  Still tired - she does not sleep well - a bit better in the past day or 2   Per daughter -not imp very much   For atypical cp and also orthostatic dizziness  Had been dizzy and fatigued in addn to night time cp   EKG no change R/o MI , nl trop nl cxr Vitals ok except mild orthostatin changes in bp Labs ok    Chemistry      Component Value Date/Time   NA 141 01/12/2015 0154   K 3.9 01/12/2015 0154   CL 111 01/12/2015 0154   CO2 25 01/12/2015 0154   BUN 16 01/12/2015 0154   CREATININE 0.92 01/12/2015 0154   CREATININE 0.82 07/07/2014 1250      Component Value Date/Time   CALCIUM 8.7 01/12/2015 0154   ALKPHOS 57 01/11/2015 1129   AST 17 01/11/2015 1129   ALT 9 01/11/2015 1129   BILITOT 0.9 01/11/2015 1129      Lab Results  Component Value Date   WBC 7.3 01/11/2015   HGB 12.3 01/11/2015   HCT 37.2 01/11/2015   MCV 89.0 01/11/2015   PLT 206 01/11/2015    Am cortisol was nl   Had saline inf and PT 2D echo nl EF   Sent home with home PT Nasal steroid for congestion  Also med for cramping for IBS--did not fill that   Wt is down 8 lb today  BP Readings from Last 3 Encounters:  01/21/15 150/76  01/12/15 113/55  07/08/14 118/61     Today she c/o of burning and hurting - at the hospital  She has low abdominal pain and also upper abd burning  Had diarrhea on Wednesday - and she vomited also - food that had not digested from the day before  Had a bout of this 20 years ago - and she was told "she was not digesting her food"  Ongoing for a while  Diarrhea is totally better now   She notices an irregular heart rate  Has premature atrial compl- on EKG and by history  She thinks that this wakes her up and makes her 'feel bad'   Hx of IBS   Patient Active  Problem List   Diagnosis Date Noted  . Sinusitis, chronic 01/12/2015  . Orthostatic dizziness 01/12/2015  . IBS (irritable bowel syndrome) 01/12/2015  . Orthostatic hypotension 01/11/2015  . Atypical chest pain 01/11/2015  . Dizziness   . Pulsatile tinnitus of left ear 07/08/2014  . Palpitations 05/13/2014  . Diarrhea 12/23/2013  . Gastritis 10/28/2013  . Second degree heart block 10/18/2013  . Syncope 10/17/2013  . UTI (lower urinary tract infection) 10/17/2013  . Abdominal aneurysm without mention of rupture 06/25/2013  . Osteopenia 01/13/2013  . CAD (coronary artery disease)   . Chronic depression 06/03/2007  . GERD 06/02/2007  . Hyperlipidemia   . Asthma   . Irritable bowel syndrome   . Chronic interstitial cystitis   . Osteoarthritis   . Spinal stenosis of lumbar region   . Recurrent urinary tract infections    Past Medical History  Diagnosis Date  . Osteoarthritis     spine/spinal stenosis  . GERD (gastroesophageal reflux disease)   . Hemorrhoids   .  IC (irritable colon)     DMSO in past  . Allergic rhinitis, seasonal   . IBS (irritable bowel syndrome)   . Diverticulosis   . CAD (coronary artery disease) 07/2009    mod by cath 08/10  . AAA (abdominal aortic aneurysm)     followed by Dr Oneida Alar. Last u/s07/10 stable AAA with largest measurement 3.97 x 4.02cm.  . Skin cancer     basal cell cancer - face.  . Osteopenia 2011  . Hyperlipidemia   . Depression   . Asthma   . Interstitial cystitis   . History of recurrent UTIs   . Disc disease, degenerative, lumbar or lumbosacral     SPINAL STENOSIS  . Atrial fibrillation   . Complication of anesthesia   . PONV (postoperative nausea and vomiting)    Past Surgical History  Procedure Laterality Date  . Right colectomy  1997    benign  . Abdominal hysterectomy      partial not cancer  . Cystocele repair    . Rectocele repair    . Hernia repair  94/1740    umbilical hernia  . Esophagogastroduodenoscopy   09/1998    erosive esphagitis ? stricture treated 12/2002  . Refractive surgery      for film after cataract surgery.  . Skin lesion excision  05/2011    basal cell skin lesion removed.  . Breast surgery      lumpectomy  . Abdominal aortic endovascular stent graft N/A 07/01/2013    Procedure: ABDOMINAL AORTIC ENDOVASCULAR STENT GRAFT- GORE;  Surgeon: Elam Dutch, MD;  Location: [facility];  Service: Vascular;  Laterality: N/A;  Ultrasound guided   History  Substance Use Topics  . Smoking status: Never Smoker   . Smokeless tobacco: Never Used  . Alcohol Use: No   Family History  Problem Relation Age of Onset  . Heart disease Mother     deceased age 33 heart attack.  Marland Kitchen Heart attack Mother   . Other Mother     varicose veins  . Varicose Veins Mother   . Heart disease Father     age 40 heart attack  . Cancer Father   . Stroke Brother   . Cancer Brother   . Heart disease Brother   . Heart attack Brother   . Cancer Daughter   . Diabetes Son    Allergies  Allergen Reactions  . Amoxicillin-Pot Clavulanate     REACTION: sick  . Avelox [Moxifloxacin Hcl In Nacl] Nausea Only  . Ibuprofen     REACTION: GI  . Keflex [Cephalexin]     abd pain / GI upset  . Nitrofurantoin Other (See Comments)    Chest pain or indigestion with nausea  . Omeprazole Nausea Only  . Rosuvastatin     REACTION: foot pain  . Sertraline Hcl     REACTION: more depressed  . Tetanus Toxoids     Local redness and swelling    Current Outpatient Prescriptions on File Prior to Visit  Medication Sig Dispense Refill  . acetaminophen (TYLENOL) 500 MG tablet Take 500 mg by mouth every 4 (four) hours as needed for mild pain or moderate pain. For pain    . albuterol (PROAIR HFA) 108 (90 BASE) MCG/ACT inhaler Inhale 2 puffs into the lungs every 4 (four) hours as needed for wheezing. 2 puffs up to every 4 hours as needed for wheezing (Patient taking differently: Inhale 1 puff into the lungs every 4 (four) hours as  needed for wheezing. 2 puffs up to every 4 hours as needed for wheezing) 1 Inhaler 11  . beclomethasone (QVAR) 80 MCG/ACT inhaler Inhale 1 puff into the lungs 2 (two) times daily. 1 Inhaler 11  . cetirizine (ZYRTEC) 10 MG tablet Take 10 mg by mouth daily as needed. For allergies    . esomeprazole (NEXIUM) 40 MG capsule Take 40 mg by mouth daily before breakfast.    . fluticasone (FLONASE) 50 MCG/ACT nasal spray Place 2 sprays into both nostrils daily. 16 g 0  . hyoscyamine (LEVSIN) 0.125 MG tablet Take 1 tablet (0.125 mg total) by mouth every 4 (four) hours as needed for cramping. 30 tablet 0  . latanoprost (XALATAN) 0.005 % ophthalmic solution Place 1 drop into both eyes at bedtime.      . Meth-Hyo-M Bl-Na Phos-Ph Sal (URIBEL) 118 MG CAPS Take 1 capsule by mouth daily as needed (urination frequency).     [name] Glycol-Propyl Glycol (SYSTANE OP) Place 1 drop into both eyes as needed (dry eyes).     . polyethylene glycol (MIRALAX / GLYCOLAX) packet Take 17 g by mouth daily as needed. For constipation     No current facility-administered medications on file prior to visit.       Review of Systems Review of Systems  Constitutional: Negative for fever, appetite change,  and unexpected weight change. pos for fatigue and gen weakness that is getting better  Eyes: Negative for pain and visual disturbance.  Respiratory: Negative for cough and shortness of breath.   Cardiovascular: Negative for cp and pos for palpitations     Gastrointestinal: Negative for , diarrhea and constipation. pos for burning in epigastrium/heartburn  Genitourinary: Negative for urgency and frequency.  Skin: Negative for pallor or rash   Neurological: Negative for weakness, light-headedness, numbness and headaches.  Hematological: Negative for adenopathy. Does not bruise/bleed easily.  Psychiatric/Behavioral: Negative for dysphoric mood. The patient is not nervous/anxious.         Objective:   Physical Exam    Constitutional: She appears well-developed and well-nourished. No distress.  Frail app elderly female  HENT:  Head: Normocephalic and atraumatic.  Left Ear: External ear normal.  Mouth/Throat: Oropharynx is clear and moist.  Eyes: Conjunctivae and EOM are normal. Pupils are equal, round, and reactive to light. Right eye exhibits no discharge. Left eye exhibits no discharge. No scleral icterus.  Neck: Normal range of motion. Neck supple. No JVD present. Carotid bruit is not present. No thyromegaly present.  Cardiovascular: Normal rate, normal heart sounds and intact distal pulses.  Exam reveals no gallop.   Frequent PACs    Pulmonary/Chest: Effort normal and breath sounds normal. No respiratory distress. She has no wheezes. She has no rales.  Abdominal: Soft. Bowel sounds are normal. She exhibits no distension and no mass. There is no hepatosplenomegaly. There is tenderness in the epigastric area. There is no rebound, no guarding, no CVA tenderness, no tenderness at McBurney's point and negative Murphy's sign.  Tender in epigastric area without rebound or guarding   Musculoskeletal: She exhibits no edema or tenderness.  Lymphadenopathy:    She has no cervical adenopathy.  Neurological: She is alert. She has normal reflexes. No cranial nerve deficit. She exhibits normal muscle tone. Coordination normal.  Skin: Skin is warm and dry. No rash noted. No erythema. No pallor.  Psychiatric: She has a normal mood and affect.          Assessment & Plan:   Problem List Items  Addressed This Visit      Digestive   Gastritis    Gastritis and gerd symptoms are much worse Diarrhea is improved  Will inc nexium to bid and continue  zantac  Disc diet habits If not improved - plan to ref back to GI  Will update         Other   Atypical chest pain - Primary    Rev hosp notes/studies in detail  Symptoms resolved R/o for MI  Was tx for dehydration Still sympt palpitations -will get to  cardiology for that       Orthostatic dizziness    Rev hosp notes and studies and labs in detail with pt and family  Much imp after inf of saline  No longer dizzy or orthostatic  Disc imp of good fluid intake Will continue to monitor      Palpitations    Ongoing and bothersome  She wants ref to cardiology  In light of recent orthostatic hypotension - would likely not tol a rate lowering agent  Ref to cardiol      Relevant Orders   Ambulatory referral to Cardiology

## 2015-01-23 NOTE — Assessment & Plan Note (Signed)
Rev hosp notes and studies and labs in detail with pt and family  Much imp after inf of saline  No longer dizzy or orthostatic  Disc imp of good fluid intake Will continue to monitor

## 2015-01-23 NOTE — Assessment & Plan Note (Signed)
Gastritis and gerd symptoms are much worse Diarrhea is improved  Will inc nexium to bid and continue  zantac  Disc diet habits If not improved - plan to ref back to GI  Will update

## 2015-01-23 NOTE — Assessment & Plan Note (Signed)
Ongoing and bothersome  She wants ref to cardiology  In light of recent orthostatic hypotension - would likely not tol a rate lowering agent  Ref to cardiol

## 2015-01-23 NOTE — Assessment & Plan Note (Signed)
Rev hosp notes/studies in detail  Symptoms resolved R/o for MI  Was tx for dehydration Still sympt palpitations -will get to cardiology for that

## 2015-01-24 DIAGNOSIS — M4806 Spinal stenosis, lumbar region: Secondary | ICD-10-CM | POA: Diagnosis not present

## 2015-01-24 DIAGNOSIS — I4891 Unspecified atrial fibrillation: Secondary | ICD-10-CM | POA: Diagnosis not present

## 2015-01-24 DIAGNOSIS — I2510 Atherosclerotic heart disease of native coronary artery without angina pectoris: Secondary | ICD-10-CM | POA: Diagnosis not present

## 2015-01-24 DIAGNOSIS — I959 Hypotension, unspecified: Secondary | ICD-10-CM | POA: Diagnosis not present

## 2015-01-24 DIAGNOSIS — K5790 Diverticulosis of intestine, part unspecified, without perforation or abscess without bleeding: Secondary | ICD-10-CM | POA: Diagnosis not present

## 2015-01-24 DIAGNOSIS — K219 Gastro-esophageal reflux disease without esophagitis: Secondary | ICD-10-CM | POA: Diagnosis not present

## 2015-01-25 ENCOUNTER — Other Ambulatory Visit: Payer: Self-pay | Admitting: Internal Medicine

## 2015-01-25 ENCOUNTER — Encounter: Payer: Self-pay | Admitting: Internal Medicine

## 2015-01-25 ENCOUNTER — Encounter: Payer: Self-pay | Admitting: Cardiovascular Disease

## 2015-01-25 ENCOUNTER — Ambulatory Visit (INDEPENDENT_AMBULATORY_CARE_PROVIDER_SITE_OTHER): Payer: Medicare Other | Admitting: Cardiovascular Disease

## 2015-01-25 VITALS — BP 122/70 | HR 81 | Ht 66.0 in | Wt 157.0 lb

## 2015-01-25 DIAGNOSIS — R42 Dizziness and giddiness: Secondary | ICD-10-CM

## 2015-01-25 DIAGNOSIS — R0789 Other chest pain: Secondary | ICD-10-CM | POA: Diagnosis not present

## 2015-01-25 NOTE — Patient Instructions (Signed)
Your physician recommends that you continue on your current medications as directed. Please refer to the Current Medication list given to you today.  Your physician recommends that you schedule a follow-up appointment in: as needed with Dr. Nahser  

## 2015-01-25 NOTE — Progress Notes (Signed)
Cardiology Office Note   Date:  01/25/2015   ID:  Ashlee Reyes, DOB Jun 10, 1921, MRN [id number]  PCP:  Loura Pardon, MD  Cardiologist:   Acie Fredrickson Wonda Cheng, MD   No chief complaint on file.  Problem List: 1. Syncope 2. AAA - s/p stent graft repair - July 2014. followed by Dr. Oneida Alar  History of Present Illness:  [name] is will having episodes of weakness. She has had some episodes of CP. She had a stent graft repair of her AAA this past July and has never really improved since that time.    Feb. 9, 2016:  History of Present Illness: Ashlee Reyes is a 81 y.o. female who presents for follow up of her palpitations.  She was seen several months ago with palpitations and a 30 day monitor was placed.  The monitor revealed NSR with PVCs.   [name] is seen for some  Pains.  The pain started in her abdomen and radiated up to her chest and down  Both  Arms ( left > right). Better , but stomach still does not feel great. No cardiac issues   Echo showed:  - Left ventricle: The cavity size was normal. Wall thickness was increased in a pattern of mild LVH. Systolic function was normal. The estimated ejection fraction was in the range of 60% to 65%. Wall motion was normal; there were no regional wall motion abnormalities. Doppler parameters are consistent with abnormal left ventricular relaxation (grade 1 diastolic dysfunction). Doppler parameters are consistent with high ventricular filling pressure. - Aortic valve: There was trivial regurgitation. - Aortic root: The aortic root was mildly dilated. - Mitral valve: Calcified annulus. Mildly thickened leaflets . - Left atrium: The atrium was mildly dilated.   Past Medical History  Diagnosis Date  . Osteoarthritis     spine/spinal stenosis  . GERD (gastroesophageal reflux disease)   . Hemorrhoids   . IC (irritable colon)     DMSO in past  . Allergic rhinitis, seasonal   . IBS (irritable  bowel syndrome)   . Diverticulosis   . CAD (coronary artery disease) 07/2009    mod by cath 08/10  . AAA (abdominal aortic aneurysm)     followed by Dr Oneida Alar. Last u/s07/10 stable AAA with largest measurement 3.97 x 4.02cm.  . Skin cancer     basal cell cancer - face.  . Osteopenia 2011  . Hyperlipidemia   . Depression   . Asthma   . Interstitial cystitis   . History of recurrent UTIs   . Disc disease, degenerative, lumbar or lumbosacral     SPINAL STENOSIS  . Atrial fibrillation   . Complication of anesthesia   . PONV (postoperative nausea and vomiting)     Past Surgical History  Procedure Laterality Date  . Right colectomy  1997    benign  . Abdominal hysterectomy      partial not cancer  . Cystocele repair    . Rectocele repair    . Hernia repair  95/3202    umbilical hernia  . Esophagogastroduodenoscopy  09/1998    erosive esphagitis ? stricture treated 12/2002  . Refractive surgery      for film after cataract surgery.  . Skin lesion excision  05/2011    basal cell skin lesion removed.  . Breast surgery      lumpectomy  . Abdominal aortic endovascular stent graft N/A 07/01/2013    Procedure: ABDOMINAL AORTIC ENDOVASCULAR STENT GRAFT- GORE;  Surgeon: Elam Dutch, MD;  Location: [facility] OR;  Service: Vascular;  Laterality: N/A;  Ultrasound guided     Current Outpatient Prescriptions  Medication Sig Dispense Refill  . acetaminophen (TYLENOL) 500 MG tablet Take 500 mg by mouth every 4 (four) hours as needed for mild pain or moderate pain. For pain    . albuterol (PROAIR HFA) 108 (90 BASE) MCG/ACT inhaler Inhale 2 puffs into the lungs every 4 (four) hours as needed for wheezing. 2 puffs up to every 4 hours as needed for wheezing (Patient taking differently: Inhale 1 puff into the lungs every 4 (four) hours as needed for wheezing. 2 puffs up to every 4 hours as needed for wheezing) 1 Inhaler 11  . beclomethasone (QVAR) 80 MCG/ACT inhaler Inhale 1 puff into the lungs 2  (two) times daily. 1 Inhaler 11  . cetirizine (ZYRTEC) 10 MG tablet Take 10 mg by mouth daily as needed. For allergies    . esomeprazole (NEXIUM) 40 MG capsule Take 1 capsule (40 mg total) by mouth 2 (two) times daily. 60 capsule 11  . fluticasone (FLONASE) 50 MCG/ACT nasal spray Place 2 sprays into both nostrils daily. 16 g 0  . hyoscyamine (LEVSIN) 0.125 MG tablet Take 1 tablet (0.125 mg total) by mouth every 4 (four) hours as needed for cramping. 30 tablet 0  . latanoprost (XALATAN) 0.005 % ophthalmic solution Place 1 drop into both eyes at bedtime.      . Meth-Hyo-M Bl-Na Phos-Ph Sal (URIBEL) 118 MG CAPS Take 1 capsule by mouth daily as needed (urination frequency).     [name] Glycol-Propyl Glycol (SYSTANE OP) Place 1 drop into both eyes as needed (dry eyes).     . polyethylene glycol (MIRALAX / GLYCOLAX) packet Take 17 g by mouth daily as needed. For constipation     No current facility-administered medications for this visit.    Allergies:   Amoxicillin-pot clavulanate; Avelox; Ibuprofen; Keflex; Nitrofurantoin; Omeprazole; Rosuvastatin; Sertraline hcl; and Tetanus toxoids    Social History:  The patient  reports that she has never smoked. She has never used smokeless tobacco. She reports that she does not drink alcohol or use illicit drugs.   Family History:  The patient's family history includes Cancer in her brother, daughter, and father; Diabetes in her son; Heart attack in her brother and mother; Heart disease in her brother, father, and mother; Other in her mother; Stroke in her brother; Varicose Veins in her mother.    ROS:  Please see the history of present illness.    Review of Systems: Constitutional:  denies fever, chills, diaphoresis, appetite change and fatigue.  HEENT: denies photophobia, eye pain, redness, hearing loss, ear pain, congestion, sore throat, rhinorrhea, sneezing, neck pain, neck stiffness and tinnitus.  Respiratory: denies SOB, DOE, cough, chest  tightness, and wheezing.  Cardiovascular: denies chest pain, palpitations and leg swelling.  Gastrointestinal: denies nausea, vomiting, abdominal pain, diarrhea, constipation, blood in stool.  Genitourinary: denies dysuria, urgency, frequency, hematuria, flank pain and difficulty urinating.  Musculoskeletal: denies  myalgias, back pain, joint swelling, arthralgias and gait problem.   Skin: denies pallor, rash and wound.  Neurological: denies dizziness, seizures, syncope, weakness, light-headedness, numbness and headaches.   Hematological: denies adenopathy, easy bruising, personal or family bleeding history.  Psychiatric/ Behavioral: denies suicidal ideation, mood changes, confusion, nervousness, sleep disturbance and agitation.       All other systems are reviewed and negative.    PHYSICAL EXAM: VS:  LMP 12/17/1968 , BMI There  is no weight on file to calculate BMI. GEN: Well nourished, well developed, in no acute distress HEENT: normal Neck: no JVD, carotid bruits, or masses Cardiac: RRR; no murmurs, rubs, or gallops,no edema  Respiratory:  clear to auscultation bilaterally, normal work of breathing GI: soft, nontender, nondistended, + BS MS: no deformity or atrophy Skin: warm and dry, no rash Neuro:  Strength and sensation are intact Psych: normal   EKG:  EKG is not ordered today.    Recent Labs: 01/11/2015: ALT 9; B Natriuretic Peptide 269.1*; Hemoglobin 12.3; Magnesium 2.3; Platelets 206; TSH 2.069 01/12/2015: BUN 16; Creatinine 0.92; Potassium 3.9; Sodium 141    Lipid Panel    Component Value Date/Time   CHOL 242* 01/12/2015 0154   TRIG 167* 01/12/2015 0154   HDL 39* 01/12/2015 0154   CHOLHDL 6.2 01/12/2015 0154   VLDL 33 01/12/2015 0154   LDLCALC 170* 01/12/2015 0154   LDLDIRECT 150.5 01/06/2013 0813      Wt Readings from Last 3 Encounters:  01/21/15 151 lb 12.8 oz (68.856 kg)  01/12/15 159 lb 9.6 oz (72.394 kg)  07/08/14 161 lb (73.029 kg)      Other  studies Reviewed: Additional studies/ records that were reviewed today include: . Review of the above records demonstrates:    ASSESSMENT AND PLAN:  Problem List: 1. Syncope - she's not had any recent episodes of syncope. She has had some dizziness that sounds more like with prostatic hypertension. 2. AAA - s/p stent graft repair - July 2014. followed by Dr. Oneida Alar  3. Orthostatic hypotension:  - She presents with an episode of orthostatic hypotension. I suspect that this was due to a GI illness. Encouraged her to   eat and drink a regular basis.  4. Premature ventricular contractions-- she was found have premature ventricular contractions on her 30 day event monitor. Reassured her that these are benign. She has normal left ventricle systolic function. She does not need any further workup   Current medicines are reviewed at length with the patient today.  The patient does not have concerns regarding medicines.  The following changes have been made:  no change   Disposition:   FU with her primary medical doctor     Signed, Arnella Pralle, Wonda Cheng, MD  01/25/2015 5:39 AM    [address], [place], [place]  [address] Phone: [phone]; Fax: [phone]

## 2015-01-26 DIAGNOSIS — K219 Gastro-esophageal reflux disease without esophagitis: Secondary | ICD-10-CM | POA: Diagnosis not present

## 2015-01-26 DIAGNOSIS — M4806 Spinal stenosis, lumbar region: Secondary | ICD-10-CM | POA: Diagnosis not present

## 2015-01-26 DIAGNOSIS — K5790 Diverticulosis of intestine, part unspecified, without perforation or abscess without bleeding: Secondary | ICD-10-CM | POA: Diagnosis not present

## 2015-01-26 DIAGNOSIS — I2510 Atherosclerotic heart disease of native coronary artery without angina pectoris: Secondary | ICD-10-CM | POA: Diagnosis not present

## 2015-01-26 DIAGNOSIS — I4891 Unspecified atrial fibrillation: Secondary | ICD-10-CM | POA: Diagnosis not present

## 2015-01-26 DIAGNOSIS — I959 Hypotension, unspecified: Secondary | ICD-10-CM | POA: Diagnosis not present

## 2015-01-28 DIAGNOSIS — K219 Gastro-esophageal reflux disease without esophagitis: Secondary | ICD-10-CM | POA: Diagnosis not present

## 2015-01-28 DIAGNOSIS — I959 Hypotension, unspecified: Secondary | ICD-10-CM | POA: Diagnosis not present

## 2015-01-28 DIAGNOSIS — K5790 Diverticulosis of intestine, part unspecified, without perforation or abscess without bleeding: Secondary | ICD-10-CM | POA: Diagnosis not present

## 2015-01-28 DIAGNOSIS — M4806 Spinal stenosis, lumbar region: Secondary | ICD-10-CM | POA: Diagnosis not present

## 2015-02-07 ENCOUNTER — Telehealth: Payer: Self-pay | Admitting: Family Medicine

## 2015-02-07 DIAGNOSIS — E7800 Pure hypercholesterolemia, unspecified: Secondary | ICD-10-CM

## 2015-02-07 DIAGNOSIS — M8580 Other specified disorders of bone density and structure, unspecified site: Secondary | ICD-10-CM

## 2015-02-07 DIAGNOSIS — Z87440 Personal history of urinary (tract) infections: Secondary | ICD-10-CM

## 2015-02-07 NOTE — Telephone Encounter (Signed)
-----   Message from Ellamae Sia sent at 02/02/2015  5:26 PM EST ----- Regarding: Lab orders for Tuesday,2.23.16 Patient is scheduled for CPX labs, please order future labs, Thanks , Karna Christmas

## 2015-02-08 ENCOUNTER — Other Ambulatory Visit (INDEPENDENT_AMBULATORY_CARE_PROVIDER_SITE_OTHER): Payer: Medicare Other

## 2015-02-08 DIAGNOSIS — E7800 Pure hypercholesterolemia, unspecified: Secondary | ICD-10-CM

## 2015-02-08 DIAGNOSIS — M8580 Other specified disorders of bone density and structure, unspecified site: Secondary | ICD-10-CM | POA: Diagnosis not present

## 2015-02-08 DIAGNOSIS — Z87440 Personal history of urinary (tract) infections: Secondary | ICD-10-CM | POA: Diagnosis not present

## 2015-02-08 LAB — CBC WITH DIFFERENTIAL/PLATELET
Basophils Absolute: 0 10*3/uL (ref 0.0–0.1)
Basophils Relative: 0.8 % (ref 0.0–3.0)
EOS PCT: 1.6 % (ref 0.0–5.0)
Eosinophils Absolute: 0.1 10*3/uL (ref 0.0–0.7)
HCT: 39.3 % (ref 36.0–46.0)
HEMOGLOBIN: 13.2 g/dL (ref 12.0–15.0)
LYMPHS PCT: 32.8 % (ref 12.0–46.0)
Lymphs Abs: 2.1 10*3/uL (ref 0.7–4.0)
MCHC: 33.5 g/dL (ref 30.0–36.0)
MCV: 87.6 fl (ref 78.0–100.0)
MONO ABS: 0.5 10*3/uL (ref 0.1–1.0)
MONOS PCT: 7.4 % (ref 3.0–12.0)
NEUTROS PCT: 57.4 % (ref 43.0–77.0)
Neutro Abs: 3.8 10*3/uL (ref 1.4–7.7)
Platelets: 303 10*3/uL (ref 150.0–400.0)
RBC: 4.49 Mil/uL (ref 3.87–5.11)
RDW: 13.8 % (ref 11.5–15.5)
WBC: 6.5 10*3/uL (ref 4.0–10.5)

## 2015-02-08 LAB — COMPREHENSIVE METABOLIC PANEL
ALT: 8 U/L (ref 0–35)
AST: 13 U/L (ref 0–37)
Albumin: 3.8 g/dL (ref 3.5–5.2)
Alkaline Phosphatase: 61 U/L (ref 39–117)
BUN: 16 mg/dL (ref 6–23)
CO2: 29 meq/L (ref 19–32)
CREATININE: 0.97 mg/dL (ref 0.40–1.20)
Calcium: 9.4 mg/dL (ref 8.4–10.5)
Chloride: 107 mEq/L (ref 96–112)
GFR: 57.15 mL/min — AB (ref >60.00)
Glucose, Bld: 90 mg/dL (ref 70–99)
Potassium: 3.9 mEq/L (ref 3.5–5.1)
SODIUM: 140 meq/L (ref 135–145)
TOTAL PROTEIN: 7.1 g/dL (ref 6.0–8.3)
Total Bilirubin: 0.6 mg/dL (ref 0.2–1.2)

## 2015-02-08 LAB — VITAMIN D 25 HYDROXY (VIT D DEFICIENCY, FRACTURES): VITD: 22.17 ng/mL — ABNORMAL LOW (ref 30.00–100.00)

## 2015-02-08 LAB — LIPID PANEL
CHOL/HDL RATIO: 6
Cholesterol: 247 mg/dL — ABNORMAL HIGH (ref 0–200)
HDL: 42.9 mg/dL (ref >39.00)
LDL Cholesterol: 179 mg/dL — ABNORMAL HIGH (ref 0–99)
NONHDL: 204.1
Triglycerides: 127 mg/dL (ref 0.0–149.0)
VLDL: 25.4 mg/dL (ref 0.0–40.0)

## 2015-02-08 LAB — TSH: TSH: 3.72 u[IU]/mL (ref 0.35–4.50)

## 2015-02-15 ENCOUNTER — Encounter: Payer: Self-pay | Admitting: Family Medicine

## 2015-02-15 ENCOUNTER — Ambulatory Visit (INDEPENDENT_AMBULATORY_CARE_PROVIDER_SITE_OTHER): Payer: Medicare Other | Admitting: Family Medicine

## 2015-02-15 VITALS — BP 126/64 | HR 73 | Temp 97.9°F | Ht 66.0 in | Wt 159.4 lb

## 2015-02-15 DIAGNOSIS — E7800 Pure hypercholesterolemia, unspecified: Secondary | ICD-10-CM

## 2015-02-15 DIAGNOSIS — E2839 Other primary ovarian failure: Secondary | ICD-10-CM | POA: Diagnosis not present

## 2015-02-15 DIAGNOSIS — M8580 Other specified disorders of bone density and structure, unspecified site: Secondary | ICD-10-CM | POA: Diagnosis not present

## 2015-02-15 DIAGNOSIS — Z0000 Encounter for general adult medical examination without abnormal findings: Secondary | ICD-10-CM | POA: Insufficient documentation

## 2015-02-15 DIAGNOSIS — Z23 Encounter for immunization: Secondary | ICD-10-CM

## 2015-02-15 MED ORDER — ESOMEPRAZOLE MAGNESIUM 40 MG PO CPDR: 40.0000 mg | DELAYED_RELEASE_CAPSULE | Freq: Two times a day (BID) | ORAL | Status: DC

## 2015-02-15 NOTE — Progress Notes (Signed)
Subjective:    Patient ID: Ashlee Reyes, female    DOB: 23-Apr-1921, 81 y.o.   MRN: [id number]  HPI Here for annual medicare wellness visit as well as chronic/acute medical problems   Wt is up 2 lb with bmi of 25  I have personally reviewed the Medicare Annual Wellness questionnaire and have noted 1. The patient's medical and social history 2. Their use of alcohol, tobacco or illicit drugs 3. Their current medications and supplements 4. The patient's functional ability including ADL's, fall risks, home safety risks and hearing or visual             impairment. 5. Diet and physical activities 6. Evidence for depression or mood disorders  The patients weight, height, BMI have been recorded in the chart and visual acuity is per eye clinic.  I have made referrals, counseling and provided education to the patient based review of the above and I have provided the pt with a written personalized care plan for preventive services.  Doing well overall -no complaints Did have a red spot on her L side- ? Bug bite    See scanned forms.  Routine anticipatory guidance given to patient.  See health maintenance. Colon cancer screening 2/04 - IBS is stable  Breast cancer screening mammogram 11/03 - wants to start doing those again she will start  Self breast exam no lumps or changes  Flu vaccine did not get a flu shot this season -will do that today  Tetanus vaccine 8/13 Pneumovax 1/11 - wants prevnar  Zoster vaccine - may want if it is covered   Advance directive -has that written up  Cognitive function addressed- see scanned forms- and if abnormal then additional documentation follows.  Thinks it is generally ok - but has noticed a change in the last month when she was sick -getting much better now  No major concerns   PMH and SH reviewed  Meds, vitals, and allergies reviewed.   ROS: See HPI.  Otherwise negative.     osteopenia No fractures  dexa 2011- wants to go ahead and  schedule that  D level is 22  hyperlipidemia Lab Results  Component Value Date   CHOL 247* 02/08/2015   CHOL 242* 01/12/2015   CHOL 233* 01/06/2013   Lab Results  Component Value Date   HDL 42.90 02/08/2015   HDL 39* 01/12/2015   HDL 41.10 01/06/2013   Lab Results  Component Value Date   LDLCALC 179* 02/08/2015   LDLCALC 170* 01/12/2015   LDLCALC * 07/15/2009    129        Total Cholesterol/HDL:CHD Risk Coronary Heart Disease Risk Table                     Men   Women  1/2 Average Risk   3.4   3.3  Average Risk       5.0   4.4  2 X Average Risk   9.6   7.1  3 X Average Risk  23.4   11.0        Use the calculated Patient Ratio above and the CHD Risk Table to determine the patient's CHD Risk.        ATP III CLASSIFICATION (LDL):  <100     mg/dL   Optimal  100-129  mg/dL   Near or Above                    Optimal  130-159  mg/dL   Borderline  160-189  mg/dL   High  >190     mg/dL   Very High   Lab Results  Component Value Date   TRIG 127.0 02/08/2015   TRIG 167* 01/12/2015   TRIG 178.0* 01/06/2013   Lab Results  Component Value Date   CHOLHDL 6 02/08/2015   CHOLHDL 6.2 01/12/2015   CHOLHDL 6 01/06/2013   Lab Results  Component Value Date   LDLDIRECT 150.5 01/06/2013   LDLDIRECT 173.9 12/05/2010   has not been able to tolerate any cholesterol medicines  Has changed her diet in the past mo - has cut out bacon and sausage and bread and butter  Her stomach does not tolerate fats  Will get more exercise in spring and summer    Lab Results  Component Value Date   WBC 6.5 02/08/2015   HGB 13.2 02/08/2015   HCT 39.3 02/08/2015   MCV 87.6 02/08/2015   PLT 303.0 02/08/2015      Chemistry      Component Value Date/Time   NA 140 02/08/2015 0820   K 3.9 02/08/2015 0820   CL 107 02/08/2015 0820   CO2 29 02/08/2015 0820   BUN 16 02/08/2015 0820   CREATININE 0.97 02/08/2015 0820   CREATININE 0.82 07/07/2014 1250      Component Value Date/Time   CALCIUM  9.4 02/08/2015 0820   ALKPHOS 61 02/08/2015 0820   AST 13 02/08/2015 0820   ALT 8 02/08/2015 0820   BILITOT 0.6 02/08/2015 0820      Lab Results  Component Value Date   TSH 3.72 02/08/2015      Patient Active Problem List   Diagnosis Date Noted  . Encounter for Medicare annual wellness exam 02/15/2015  . Estrogen deficiency 02/15/2015  . Sinusitis, chronic 01/12/2015  . Orthostatic dizziness 01/12/2015  . IBS (irritable bowel syndrome) 01/12/2015  . Orthostatic hypotension 01/11/2015  . Atypical chest pain 01/11/2015  . Dizziness   . Pulsatile tinnitus of left ear 07/08/2014  . Palpitations 05/13/2014  . Diarrhea 12/23/2013  . Gastritis 10/28/2013  . Second degree heart block 10/18/2013  . Syncope 10/17/2013  . UTI (lower urinary tract infection) 10/17/2013  . Abdominal aneurysm without mention of rupture 06/25/2013  . Osteopenia 01/13/2013  . CAD (coronary artery disease)   . Chronic depression 06/03/2007  . GERD 06/02/2007  . Hyperlipidemia   . Asthma   . Irritable bowel syndrome   . Chronic interstitial cystitis   . Osteoarthritis   . Spinal stenosis of lumbar region   . History of urinary tract infection    Past Medical History  Diagnosis Date  . Osteoarthritis     spine/spinal stenosis  . GERD (gastroesophageal reflux disease)   . Hemorrhoids   . IC (irritable colon)     DMSO in past  . Allergic rhinitis, seasonal   . IBS (irritable bowel syndrome)   . Diverticulosis   . CAD (coronary artery disease) 07/2009    mod by cath 08/10  . AAA (abdominal aortic aneurysm)     followed by Dr Oneida Alar. Last u/s07/10 stable AAA with largest measurement 3.97 x 4.02cm.  . Skin cancer     basal cell cancer - face.  . Osteopenia 2011  . Hyperlipidemia   . Depression   . Asthma   . Interstitial cystitis   . History of recurrent UTIs   . Disc disease, degenerative, lumbar or lumbosacral     SPINAL STENOSIS  .  Atrial fibrillation   . Complication of anesthesia     . PONV (postoperative nausea and vomiting)    Past Surgical History  Procedure Laterality Date  . Right colectomy  1997    benign  . Abdominal hysterectomy      partial not cancer  . Cystocele repair    . Rectocele repair    . Hernia repair  88/4166    umbilical hernia  . Esophagogastroduodenoscopy  09/1998    erosive esphagitis ? stricture treated 12/2002  . Refractive surgery      for film after cataract surgery.  . Skin lesion excision  05/2011    basal cell skin lesion removed.  . Breast surgery      lumpectomy  . Abdominal aortic endovascular stent graft N/A 07/01/2013    Procedure: ABDOMINAL AORTIC ENDOVASCULAR STENT GRAFT- GORE;  Surgeon: Elam Dutch, MD;  Location: [facility];  Service: Vascular;  Laterality: N/A;  Ultrasound guided   History  Substance Use Topics  . Smoking status: Never Smoker   . Smokeless tobacco: Never Used  . Alcohol Use: No   Family History  Problem Relation Age of Onset  . Heart disease Mother     deceased age 79 heart attack.  Marland Kitchen Heart attack Mother   . Other Mother     varicose veins  . Varicose Veins Mother   . Heart disease Father     age 81 heart attack  . Cancer Father   . Stroke Brother   . Cancer Brother   . Heart disease Brother   . Heart attack Brother   . Cancer Daughter   . Diabetes Son    Allergies  Allergen Reactions  . Amoxicillin-Pot Clavulanate     REACTION: sick  . Avelox [Moxifloxacin Hcl In Nacl] Nausea Only  . Ibuprofen     REACTION: GI  . Keflex [Cephalexin]     abd pain / GI upset  . Nitrofurantoin Other (See Comments)    Chest pain or indigestion with nausea  . Omeprazole Nausea Only  . Rosuvastatin     REACTION: foot pain  . Sertraline Hcl     REACTION: more depressed  . Tetanus Toxoids     Local redness and swelling    Current Outpatient Prescriptions on File Prior to Visit  Medication Sig Dispense Refill  . acetaminophen (TYLENOL) 500 MG tablet Take 500 mg by mouth every 4 (four) hours as  needed for mild pain or moderate pain. For pain    . albuterol (PROAIR HFA) 108 (90 BASE) MCG/ACT inhaler Inhale 2 puffs into the lungs every 4 (four) hours as needed for wheezing. 2 puffs up to every 4 hours as needed for wheezing (Patient taking differently: Inhale 1 puff into the lungs every 4 (four) hours as needed for wheezing. 2 puffs up to every 4 hours as needed for wheezing) 1 Inhaler 11  . beclomethasone (QVAR) 80 MCG/ACT inhaler Inhale 1 puff into the lungs 2 (two) times daily. 1 Inhaler 11  . cetirizine (ZYRTEC) 10 MG tablet Take 10 mg by mouth daily as needed. For allergies    . fluticasone (FLONASE) 50 MCG/ACT nasal spray Place 2 sprays into both nostrils daily. 16 g 0  . latanoprost (XALATAN) 0.005 % ophthalmic solution Place 1 drop into both eyes at bedtime.      . Meth-Hyo-M Bl-Na Phos-Ph Sal (URIBEL) 118 MG CAPS Take 1 capsule by mouth daily as needed (urination frequency).     [name] Glycol-Propyl  Glycol (SYSTANE OP) Place 1 drop into both eyes as needed (dry eyes).     . polyethylene glycol (MIRALAX / GLYCOLAX) packet Take 17 g by mouth daily as needed. For constipation     No current facility-administered medications on file prior to visit.    Review of Systems Review of Systems  Constitutional: Negative for fever, appetite change, fatigue and unexpected weight change.  Eyes: Negative for pain and visual disturbance.  Respiratory: Negative for cough and shortness of breath.   Cardiovascular: Negative for cp or palpitations    Gastrointestinal: Negative for nausea, diarrhea and constipation.  Genitourinary: Negative for urgency and frequency.  Skin: Negative for pallor or rash   Neurological: Negative for weakness, light-headedness, numbness and headaches.  Hematological: Negative for adenopathy. Does not bruise/bleed easily.  Psychiatric/Behavioral: Negative for dysphoric mood. The patient is not nervous/anxious.         Objective:   Physical Exam    Constitutional: She appears well-developed and well-nourished. No distress.  HENT:  Head: Normocephalic and atraumatic.  Right Ear: External ear normal.  Left Ear: External ear normal.  Mouth/Throat: Oropharynx is clear and moist.  Eyes: Conjunctivae and EOM are normal. Pupils are equal, round, and reactive to light. No scleral icterus.  Neck: Normal range of motion. Neck supple. No JVD present. Carotid bruit is not present. No thyromegaly present.  Cardiovascular: Normal rate, regular rhythm, normal heart sounds and intact distal pulses.  Exam reveals no gallop.   Pulmonary/Chest: Effort normal and breath sounds normal. No respiratory distress. She has no wheezes. She exhibits no tenderness.  Abdominal: Soft. Bowel sounds are normal. She exhibits no distension, no abdominal bruit and no mass. There is no tenderness.  Genitourinary: No breast swelling, tenderness, discharge or bleeding.  Breast exam: No mass, nodules, thickening, tenderness, bulging, retraction, inflamation, nipple discharge or skin changes noted.  No axillary or clavicular LA.      Musculoskeletal: She exhibits no edema or tenderness.  Limited rom of spine   Lymphadenopathy:    She has no cervical adenopathy.  Neurological: She is alert. She has normal reflexes. No cranial nerve deficit. She exhibits normal muscle tone. Coordination normal.  Skin: Skin is warm and dry. No rash noted. No erythema. No pallor.  Small healing papule on L side   Psychiatric: She has a normal mood and affect.          Assessment & Plan:   Problem List Items Addressed This Visit      Musculoskeletal and Integument   Osteopenia    Disc need for calcium/ vitamin D/ wt bearing exercise and bone density test every 2 y to monitor Disc safety/ fracture risk in detail   Will schedule dexa  Also inc vit D intake - for low level          Other   Encounter for Medicare annual wellness exam - Primary    Reviewed health habits including  diet and exercise and skin cancer prevention Reviewed appropriate screening tests for age  Also reviewed health mt list, fam hx and immunization status , as well as social and family history   See HPI Labs reviewed  Flu shot and prevnar shot today  She will check on coverage of shingles vaccine  Also start 2000 iu vit D daily       Estrogen deficiency    Ref for dexa       Relevant Orders   DG Bone Density   Hyperlipidemia (Chronic)  Disc goals for lipids and reasons to control them Rev labs with pt Rev low sat fat diet in detail Pt does not tolerate any statins        Other Visit Diagnoses    Need for pneumococcal vaccination        Relevant Orders    Pneumococcal conjugate vaccine 13-valent IM (Completed)    Flu vaccine need        Relevant Orders    Flu Vaccine QUAD 36+ mos IM (Completed)

## 2015-02-15 NOTE — Patient Instructions (Signed)
Flu shot and prevnar shot today  If you are interested in a shingles/zoster vaccine - call your insurance to check on coverage,( you should not get it within 1 month of other vaccines) , then call us for a prescription  for it to take to a pharmacy that gives the shot , or make a nurse visit to get it here depending on your coverage   Get vitamin D3 2000 iu and take 1 daily  Stop at check out to schedule bone density test   Avoid red meat/ fried foods/ egg yolks/ fatty breakfast meats/ butter, cheese and high fat dairy/ and shellfish  For cholesterol

## 2015-02-15 NOTE — Progress Notes (Signed)
Pre visit review using our clinic review tool, if applicable. No additional management support is needed unless otherwise documented below in the visit note. 

## 2015-02-17 NOTE — Assessment & Plan Note (Signed)
Disc need for calcium/ vitamin D/ wt bearing exercise and bone density test every 2 y to monitor Disc safety/ fracture risk in detail   Will schedule dexa  Also inc vit D intake - for low level

## 2015-02-17 NOTE — Assessment & Plan Note (Signed)
Disc goals for lipids and reasons to control them Rev labs with pt Rev low sat fat diet in detail Pt does not tolerate any statins

## 2015-02-17 NOTE — Assessment & Plan Note (Signed)
Ref for dexa 

## 2015-02-17 NOTE — Assessment & Plan Note (Signed)
Reviewed health habits including diet and exercise and skin cancer prevention Reviewed appropriate screening tests for age  Also reviewed health mt list, fam hx and immunization status , as well as social and family history   See HPI Labs reviewed  Flu shot and prevnar shot today  She will check on coverage of shingles vaccine  Also start 2000 iu vit D daily

## 2015-02-21 ENCOUNTER — Encounter: Payer: Self-pay | Admitting: Family Medicine

## 2015-02-21 ENCOUNTER — Ambulatory Visit (INDEPENDENT_AMBULATORY_CARE_PROVIDER_SITE_OTHER): Payer: Medicare Other | Admitting: Family Medicine

## 2015-02-21 VITALS — BP 126/68 | HR 77 | Temp 98.0°F | Ht 68.0 in | Wt 159.8 lb

## 2015-02-21 DIAGNOSIS — T881XXA Other complications following immunization, not elsewhere classified, initial encounter: Secondary | ICD-10-CM

## 2015-02-21 NOTE — Patient Instructions (Signed)
Use intermittent ice and heat (10 minutes at at time) on arm  Continue benadryl if it helps If worse let me know   If any shortness of breath or wheezing please let me know

## 2015-02-21 NOTE — Progress Notes (Signed)
Pre visit review using our clinic review tool, if applicable. No additional management support is needed unless otherwise documented below in the visit note. 

## 2015-02-21 NOTE — Assessment & Plan Note (Signed)
Redness and itching and swelling at imm site  No symptoms of anaphylaxis Interestingly she had a similar rxn to Td but not pneumovax 23  This makes her nervous about any more shots in the future (she is done with pneumonia vaccines now) Flu vaccine site is ok Will continue to watch -if worse/update Recommend intermittent ice and heat and elevation  Benadryl prn for itch

## 2015-02-21 NOTE — Progress Notes (Signed)
   Subjective:    Patient ID: Ashlee Reyes, female    DOB: [birth year], 80 y.o.   MRN: [id number]  HPI Here with local reaction to prevnar  In L arm  Very red and itchy   She has had   Did not have a severe reaction to pneumovax   No sob or wheeze Is hot and red and itchy Has taken benadryl        Review of Systems Review of Systems  Constitutional: Negative for fever, appetite change, fatigue and unexpected weight change.  Eyes: Negative for pain and visual disturbance.  Respiratory: Negative for cough and shortness of breath.   Cardiovascular: Negative for cp or palpitations    Gastrointestinal: Negative for nausea, diarrhea and constipation.  Genitourinary: Negative for urgency and frequency.  Skin: Negative for pallor and pos for redness/rash of L arm  Neurological: Negative for weakness, light-headedness, numbness and headaches.  Hematological: Negative for adenopathy. Does not bruise/bleed easily.  Psychiatric/Behavioral: Negative for dysphoric mood. The patient is not nervous/anxious.         Objective:   Physical Exam  Constitutional: She appears well-developed and well-nourished.  Neck: Normal range of motion. Neck supple.  Cardiovascular: Normal rate and regular rhythm.   Pulmonary/Chest: Effort normal and breath sounds normal. No respiratory distress. She has no wheezes.  Lymphadenopathy:    She has no cervical adenopathy.  Skin: Skin is warm and dry. There is erythema.  Large area of erythema and induration of L arm covering about 1/2 of upper arm at imm site  No excoriations   Psychiatric: She has a normal mood and affect.          Assessment & Plan:   Problem List Items Addressed This Visit      Other   Local reaction to immunization - Primary    Redness and itching and swelling at imm site  No symptoms of anaphylaxis Interestingly she had a similar rxn to Td but not pneumovax 23  This makes her nervous about any more shots in the  future (she is done with pneumonia vaccines now) Flu vaccine site is ok Will continue to watch -if worse/update Recommend intermittent ice and heat and elevation  Benadryl prn for itch

## 2015-03-07 ENCOUNTER — Encounter: Payer: Self-pay | Admitting: Cardiology

## 2015-03-23 ENCOUNTER — Encounter: Payer: Self-pay | Admitting: Podiatrist

## 2015-03-23 ENCOUNTER — Ambulatory Visit (INDEPENDENT_AMBULATORY_CARE_PROVIDER_SITE_OTHER): Payer: Medicare Other | Admitting: Podiatrist

## 2015-03-23 ENCOUNTER — Ambulatory Visit (INDEPENDENT_AMBULATORY_CARE_PROVIDER_SITE_OTHER): Payer: Medicare Other

## 2015-03-23 VITALS — BP 123/65 | HR 75 | Resp 15

## 2015-03-23 DIAGNOSIS — M79672 Pain in left foot: Secondary | ICD-10-CM | POA: Diagnosis not present

## 2015-03-23 DIAGNOSIS — M2010 Hallux valgus (acquired), unspecified foot: Secondary | ICD-10-CM

## 2015-03-23 DIAGNOSIS — M79671 Pain in right foot: Secondary | ICD-10-CM

## 2015-03-23 DIAGNOSIS — M2040 Other hammer toe(s) (acquired), unspecified foot: Secondary | ICD-10-CM | POA: Diagnosis not present

## 2015-03-23 NOTE — Progress Notes (Signed)
Chief Complaint  Patient presents with  . Foot Pain    bilateral foot pain     HPI: Patient is 81 y.o. female who presents today for bilateral foot pain notably bunion pain and hammertoe pain especially on the left foot. She's tried changing shoes with no relief in symptoms. She presents today with her daughter.   Allergies  Allergen Reactions  . Amoxicillin-Pot Clavulanate     REACTION: sick  . Avelox [Moxifloxacin Hcl In Nacl] Nausea Only  . Ibuprofen     REACTION: GI  . Keflex [Cephalexin]     abd pain / GI upset  . Nitrofurantoin Other (See Comments)    Chest pain or indigestion with nausea  . Omeprazole Nausea Only  . Prevnar [Pneumococcal 13-Val Conj Vacc] Itching    Severe local reaction with redness and itching   . Rosuvastatin     REACTION: foot pain  . Sertraline Hcl     REACTION: more depressed  . Tetanus Toxoids     Local redness and swelling     Physical Exam  Patient is awake, alert, and oriented x 3.  In no acute distress.  Vascular status is intact with palpable pedal pulses at 2/4 DP and PT bilateral and capillary refill time within normal limits. Neurological sensation is also intact bilaterally via Semmes Weinstein monofilament at 5/5 sites.Dermatological exam reveals skin color, turger and texture as normal. No open lesions present.  Musculature intact with dorsiflexion, plantarflexion, inversion, eversion. Notable bunion deformity which is severe in nature as is noted on the right foot with a second digit hammertoe noted as well. Right foot has a moderate bunion deformity is also present in fracture of right second digit is also present. X-ray show a severe bunion deformity on the right foot with metatarsus adductus deformity noted as well. Lateral deviation of all digits is also noted. Bunion is also present on the left foot with a hammer toe deformity present second digit..  Assessment: Severe bunion deformity right foot with contracture deformity noted. Mild  bunion deformity left with contracture deformity noted.  Plan: Discussed that because her deformities are structural in nature surgery is the only option at repairing them. Discussed that due to her age and other medical conditions I would not recommend this as a first line of treatment. Recommended shoe gear changes and also recommended that she try the shoe market to try and accommodate her feet to the shoes. Also discussed going to a shoe repair shop to have the shoes stretched in order to accommodate for the large bunion deformity and hammertoe prominence. Discussed that an accommodative shoe would likely be her best option with mesh to accommodate for the painful prominences. Did show her the x-rays and discussed it would be a difficult fix surgically due to her metatarsus adductus on this large bunion deformity and deviated toes. She'll try conservative treatments first.

## 2015-05-26 ENCOUNTER — Ambulatory Visit (INDEPENDENT_AMBULATORY_CARE_PROVIDER_SITE_OTHER)
Admission: RE | Admit: 2015-05-26 | Discharge: 2015-05-26 | Disposition: A | Discharge status: Home / Self Care | Payer: Medicare Other | Source: Ambulatory Visit | Attending: Primary Care | Admitting: Primary Care

## 2015-05-26 ENCOUNTER — Ambulatory Visit (INDEPENDENT_AMBULATORY_CARE_PROVIDER_SITE_OTHER): Payer: Medicare Other | Admitting: Primary Care

## 2015-05-26 ENCOUNTER — Encounter: Payer: Self-pay | Admitting: Primary Care

## 2015-05-26 VITALS — BP 120/68 | HR 57 | Temp 97.6°F | Ht 68.0 in | Wt 159.1 lb

## 2015-05-26 DIAGNOSIS — D225 Melanocytic nevi of trunk: Secondary | ICD-10-CM | POA: Diagnosis not present

## 2015-05-26 DIAGNOSIS — D485 Neoplasm of uncertain behavior of skin: Secondary | ICD-10-CM | POA: Diagnosis not present

## 2015-05-26 DIAGNOSIS — L821 Other seborrheic keratosis: Secondary | ICD-10-CM | POA: Diagnosis not present

## 2015-05-26 DIAGNOSIS — R4781 Slurred speech: Secondary | ICD-10-CM

## 2015-05-26 DIAGNOSIS — R0989 Other specified symptoms and signs involving the circulatory and respiratory systems: Secondary | ICD-10-CM | POA: Diagnosis not present

## 2015-05-26 DIAGNOSIS — L918 Other hypertrophic disorders of the skin: Secondary | ICD-10-CM | POA: Diagnosis not present

## 2015-05-26 DIAGNOSIS — Z85828 Personal history of other malignant neoplasm of skin: Secondary | ICD-10-CM | POA: Diagnosis not present

## 2015-05-26 DIAGNOSIS — L720 Epidermal cyst: Secondary | ICD-10-CM | POA: Diagnosis not present

## 2015-05-26 DIAGNOSIS — L814 Other melanin hyperpigmentation: Secondary | ICD-10-CM | POA: Diagnosis not present

## 2015-05-26 DIAGNOSIS — R51 Headache: Secondary | ICD-10-CM | POA: Diagnosis not present

## 2015-05-26 DIAGNOSIS — D045 Carcinoma in situ of skin of trunk: Secondary | ICD-10-CM | POA: Diagnosis not present

## 2015-05-26 DIAGNOSIS — D0461 Carcinoma in situ of skin of right upper limb, including shoulder: Secondary | ICD-10-CM | POA: Diagnosis not present

## 2015-05-26 DIAGNOSIS — D1801 Hemangioma of skin and subcutaneous tissue: Secondary | ICD-10-CM | POA: Diagnosis not present

## 2015-05-26 DIAGNOSIS — R200 Anesthesia of skin: Secondary | ICD-10-CM | POA: Diagnosis not present

## 2015-05-26 DIAGNOSIS — L738 Other specified follicular disorders: Secondary | ICD-10-CM | POA: Diagnosis not present

## 2015-05-26 NOTE — Progress Notes (Signed)
Subjective:    Patient ID: Ashlee Reyes, female    DOB: 1921-04-05, 81 y.o.   MRN: [id number]  HPI  [name] is a 81 year old female who presents today with a chief complaint of numbness to her tongue and slurred speech. Her slurred speech began 2 days ago and has been present intermittently since, including today. She presented to her dermatologist this morning with these symptoms and was sent to her PCP for further evaluation.  She took an excedrin several days ago when her symptoms developed and felt better. Her daughter manages her health and is measuring blood sugars which are (fasting) running in the 80's. Her blood pressures have been 120's/60's over the past several days. She completed a CT head in 2014 which showed:  IMPRESSION: 1. No acute intracranial abnormality. 2. Moderate to severe generalized atrophy and moderate chronic microvascular ischemic changes of the white matter with focal lacunar strokes in the basal ganglia bilaterally. 3. Incidental subcentimeter bilateral frontal calcified meningiomas as noted previously.  She cannot tolerate statins and tries to manage her cholesterol with diet. She cannot tolerate aspirin due to GI upset.  Review of Systems  Constitutional: Positive for fatigue. Negative for fever and chills.  Respiratory: Negative for cough and shortness of breath.   Cardiovascular: Negative for chest pain.  Gastrointestinal: Negative for nausea and vomiting.  Genitourinary: Negative for dysuria, urgency and frequency.  Neurological: Positive for light-headedness, numbness and headaches.       Past Medical History  Diagnosis Date  . Osteoarthritis     spine/spinal stenosis  . GERD (gastroesophageal reflux disease)   . Hemorrhoids   . IC (irritable colon)     DMSO in past  . Allergic rhinitis, seasonal   . IBS (irritable bowel syndrome)   . Diverticulosis   . CAD (coronary artery disease) 07/2009    mod by cath 08/10  . AAA  (abdominal aortic aneurysm)     followed by Dr Oneida Alar. Last u/s07/10 stable AAA with largest measurement 3.97 x 4.02cm.  . Skin cancer     basal cell cancer - face.  . Osteopenia 2011  . Hyperlipidemia   . Depression   . Asthma   . Interstitial cystitis   . History of recurrent UTIs   . Disc disease, degenerative, lumbar or lumbosacral     SPINAL STENOSIS  . Atrial fibrillation   . Complication of anesthesia   . PONV (postoperative nausea and vomiting)     History   Social History  . Marital Status: Married    Spouse Name: N/A  . Number of Children: N/A  . Years of Education: N/A   Occupational History  . Not on file.   Social History Main Topics  . Smoking status: Never Smoker   . Smokeless tobacco: Never Used  . Alcohol Use: No  . Drug Use: No  . Sexual Activity: Not on file   Other Topics Concern  . Not on file   Social History Narrative    Past Surgical History  Procedure Laterality Date  . Right colectomy  1997    benign  . Abdominal hysterectomy      partial not cancer  . Cystocele repair    . Rectocele repair    . Hernia repair  [id number]/8546    umbilical hernia  . Esophagogastroduodenoscopy  09/1998    erosive esphagitis ? stricture treated 12/2002  . Refractive surgery      for film after cataract surgery.  Marland Kitchen  Skin lesion excision  05/2011    basal cell skin lesion removed.  . Breast surgery      lumpectomy  . Abdominal aortic endovascular stent graft N/A 07/01/2013    Procedure: ABDOMINAL AORTIC ENDOVASCULAR STENT GRAFT- GORE;  Surgeon: Elam Dutch, MD;  Location: [facility] OR;  Service: Vascular;  Laterality: N/A;  Ultrasound guided    Family History  Problem Relation Age of Onset  . Heart disease Mother     deceased age 56 heart attack.  Marland Kitchen Heart attack Mother   . Other Mother     varicose veins  . Varicose Veins Mother   . Heart disease Father     age 19 heart attack  . Cancer Father   . Stroke Brother   . Cancer Brother   . Heart disease  Brother   . Heart attack Brother   . Cancer Daughter   . Diabetes Son     Allergies  Allergen Reactions  . Amoxicillin-Pot Clavulanate     REACTION: sick  . Avelox [Moxifloxacin Hcl In Nacl] Nausea Only  . Ibuprofen     REACTION: GI  . Keflex [Cephalexin]     abd pain / GI upset  . Nitrofurantoin Other (See Comments)    Chest pain or indigestion with nausea  . Omeprazole Nausea Only  . Prevnar [Pneumococcal 13-Val Conj Vacc] Itching    Severe local reaction with redness and itching   . Rosuvastatin     REACTION: foot pain  . Sertraline Hcl     REACTION: more depressed  . Tetanus Toxoids     Local redness and swelling     Current Outpatient Prescriptions on File Prior to Visit  Medication Sig Dispense Refill  . acetaminophen (TYLENOL) 500 MG tablet Take 500 mg by mouth every 4 (four) hours as needed for mild pain or moderate pain. For pain    . albuterol (PROAIR HFA) 108 (90 BASE) MCG/ACT inhaler Inhale 2 puffs into the lungs every 4 (four) hours as needed for wheezing. 2 puffs up to every 4 hours as needed for wheezing (Patient taking differently: Inhale 1 puff into the lungs every 4 (four) hours as needed for wheezing. 2 puffs up to every 4 hours as needed for wheezing) 1 Inhaler 11  . beclomethasone (QVAR) 80 MCG/ACT inhaler Inhale 1 puff into the lungs 2 (two) times daily. 1 Inhaler 11  . cetirizine (ZYRTEC) 10 MG tablet Take 10 mg by mouth daily as needed. For allergies    . esomeprazole (NEXIUM) 40 MG capsule Take 1 capsule (40 mg total) by mouth 2 (two) times daily. 60 capsule 11  . fluticasone (FLONASE) 50 MCG/ACT nasal spray Place 2 sprays into both nostrils daily. 16 g 0  . latanoprost (XALATAN) 0.005 % ophthalmic solution Place 1 drop into both eyes at bedtime.      . Meth-Hyo-M Bl-Na Phos-Ph Sal (URIBEL) 118 MG CAPS Take 1 capsule by mouth daily as needed (urination frequency).     [name] Glycol-Propyl Glycol (SYSTANE OP) Place 1 drop into both eyes as needed  (dry eyes).     . polyethylene glycol (MIRALAX / GLYCOLAX) packet Take 17 g by mouth daily as needed. For constipation     No current facility-administered medications on file prior to visit.    BP 120/68 mmHg  Pulse 57  Temp(Src) 97.6 F (36.4 C) (Oral)  Ht 5\' 8"  (1.727 m)  Wt 159 lb 1.9 oz (72.176 kg)  BMI 24.20 kg/m2  SpO2  95%  LMP 12/17/1968    Objective:   Physical Exam  Constitutional: She is oriented to person, place, and time. She appears well-nourished. She does not appear ill.  Cardiovascular: Normal rate and regular rhythm.   Bruit noted to left carotid.  Pulmonary/Chest: Effort normal and breath sounds normal.  Neurological: She is alert and oriented to person, place, and time. She has normal strength. GCS eye subscore is 4. GCS verbal subscore is 5. GCS motor subscore is 6.  No facial drooping, good grips bilaterally, no arm drift. Mild slurred speech present during exam.  Skin: Skin is warm and dry.  Psychiatric: She has a normal mood and affect.          Assessment & Plan:  Slurred speech:  Present on exam. Grips equal bilaterally, no arm drift or facial drooping. Alert and oriented x3. Bruit noted to left carotid on exam. Stat CT head without contrast. Stat referral to neurology. Carotid dopplers bilaterally ordered. She cannot tolerate statins or aspirin. Instructions provided regarding when to present to emergency department. Will copy PCP.

## 2015-05-26 NOTE — Patient Instructions (Addendum)
Stop by Marion's office to get scheduled for your CT and carotid dopplers. She will also get you scheduled for appointment with neurology.  If you start to develop facial drooping, weakness to one or both sides, increased slurred speech, very bad headache then go straight to the emergency department.  It was nice meeting you!

## 2015-05-26 NOTE — Progress Notes (Signed)
Pre visit review using our clinic review tool, if applicable. No additional management support is needed unless otherwise documented below in the visit note. 

## 2015-05-31 ENCOUNTER — Ambulatory Visit (HOSPITAL_COMMUNITY): Payer: Medicare Other | Attending: Primary Care

## 2015-05-31 DIAGNOSIS — I6523 Occlusion and stenosis of bilateral carotid arteries: Secondary | ICD-10-CM | POA: Diagnosis not present

## 2015-05-31 DIAGNOSIS — R0989 Other specified symptoms and signs involving the circulatory and respiratory systems: Secondary | ICD-10-CM | POA: Diagnosis not present

## 2015-06-01 ENCOUNTER — Ambulatory Visit (INDEPENDENT_AMBULATORY_CARE_PROVIDER_SITE_OTHER): Payer: Medicare Other | Admitting: Neurology

## 2015-06-01 ENCOUNTER — Encounter: Payer: Self-pay | Admitting: Neurology

## 2015-06-01 VITALS — BP 136/64 | HR 68 | Resp 14 | Ht 68.0 in | Wt 161.8 lb

## 2015-06-01 DIAGNOSIS — R4781 Slurred speech: Secondary | ICD-10-CM | POA: Diagnosis not present

## 2015-06-01 DIAGNOSIS — R200 Anesthesia of skin: Secondary | ICD-10-CM

## 2015-06-01 DIAGNOSIS — I6523 Occlusion and stenosis of bilateral carotid arteries: Secondary | ICD-10-CM

## 2015-06-01 DIAGNOSIS — I6529 Occlusion and stenosis of unspecified carotid artery: Secondary | ICD-10-CM | POA: Insufficient documentation

## 2015-06-01 MED ORDER — CLOPIDOGREL BISULFATE 75 MG PO TABS: 75.0000 mg | ORAL_TABLET | Freq: Every day | ORAL | Status: DC

## 2015-06-01 NOTE — Progress Notes (Signed)
[facility]  PATIENT: Ashlee Reyes DOB: 1921-08-31  REFERRING DOCTOR OR PCP:  Alma Friendly, NP  SOURCE: patient, records from EMR and CT images on PACS.  _________________________________   HISTORICAL  CHIEF COMPLAINT:  Chief Complaint  Patient presents with  . Headache    Sts. on 05-24-15 she had h/a that was worse than her usual h/a's, numbness to tongue, slurred speech, fatigue.  Sts. she took Excedrin for h/a, did not seek tx. at that time.  Sts. sx. persisted for 2 days--she saw her pcp, [facility] on 05-26-15, sts. CT head and carotid duplex done--but is not aware of results. Sts. she occasionally hears heartbeat in her ears.[place]  . Aphasia    HISTORY OF PRESENT ILLNESS:  I had the pleasure seeing you patient, Ashlee Reyes, at [facility] for neurologic consultation regarding her episode of slurred speech and tongue numbness associated with a headache. As you know, she is a 81 year old woman who had the onset of headache on 05/24/2015. As a headache worsened she began to notice that there was also slurred speech. Besides a slurred speech she had difficulty coming up with the right words but was able to get her expressed. She also felt very tired. She took an over-the-counter Excedrin Migraine medication and after couple doses, her headache went away. The headache was located on both sides of the head near the vertex the quality of the pain was achy and it did not change when she moves her head. The numbness that she experiences in her tongue was on both sides.  Compared to last week, she feels she is doing better but is not back at her baseline. Specifically, she still is noting some slurring of her speech and she still has a strange sensation in her mouth.   She saw Alma Friendly, NP on 05/26/2015 and she ordered a CT scan of the head and a carotid Doppler study. I personally reviewed the CT scan of the head images. It shows  minimal small vessel ischemic changes with tiny chronic lacunar infarction near the head of the left lateral ventricle. The extent of atrophy is actually better than average for age. The carotid Doppler results are not available. She had a carotid ultrasound on 07/12/2014 that showed about 40% stenosis in both arteries.  She also has history of CAD and peripheral vascular disease.  She is on both Nexium and Zantac for her stomach and esophageal issues. Zantac monotherapy was insufficient to help her in the past.  She has a poor gait and LBP due to spinal stenosis of the lumbar spine. She has had procedures in the past to help with her back pain with some benefit in the past.  REVIEW OF SYSTEMS: Constitutional: No fevers, chills, sweats, or change in appetite Eyes: No visual changes, double vision, eye pain Ear, nose and throat: No hearing loss, ear pain, nasal congestion, sore throat Cardiovascular: No chest pain, palpitations Respiratory: No shortness of breath at rest or with exertion.   No wheezes GastrointestinaI: No nausea, vomiting.   She has GERD Genitourinary: No dysuria, urinary retention or frequency.  No nocturia. Musculoskeletal: She has back pain due to spinal stenosis Integumentary: No rash, pruritus, skin lesions Neurological: as above Psychiatric: No depression at this time.  No anxiety Endocrine: No palpitations, diaphoresis, change in appetite, change in weigh or increased thirst Hematologic/Lymphatic: No anemia, purpura, petechiae. Allergic/Immunologic: No itchy/runny eyes, nasal congestion, recent allergic reactions, rashes  ALLERGIES: Allergies  Allergen Reactions  .  Amoxicillin-Pot Clavulanate     REACTION: sick  . Avelox [Moxifloxacin Hcl In Nacl] Nausea Only  . Ibuprofen     REACTION: GI  . Keflex [Cephalexin]     abd pain / GI upset  . Nitrofurantoin Other (See Comments)    Chest pain or indigestion with nausea  . Omeprazole Nausea Only  . Prevnar  [Pneumococcal 13-Val Conj Vacc] Itching    Severe local reaction with redness and itching   . Rosuvastatin     REACTION: foot pain  . Sertraline Hcl     REACTION: more depressed  . Tetanus Toxoids     Local redness and swelling     HOME MEDICATIONS:  Current outpatient prescriptions:  .  acetaminophen (TYLENOL) 500 MG tablet, Take 500 mg by mouth every 4 (four) hours as needed for mild pain or moderate pain. For pain, Disp: , Rfl:  .  albuterol (PROAIR HFA) 108 (90 BASE) MCG/ACT inhaler, Inhale 2 puffs into the lungs every 4 (four) hours as needed for wheezing. 2 puffs up to every 4 hours as needed for wheezing (Patient taking differently: Inhale 1 puff into the lungs every 4 (four) hours as needed for wheezing. 2 puffs up to every 4 hours as needed for wheezing), Disp: 1 Inhaler, Rfl: 11 .  beclomethasone (QVAR) 80 MCG/ACT inhaler, Inhale 1 puff into the lungs 2 (two) times daily., Disp: 1 Inhaler, Rfl: 11 .  cetirizine (ZYRTEC) 10 MG tablet, Take 10 mg by mouth daily as needed. For allergies, Disp: , Rfl:  .  esomeprazole (NEXIUM) 40 MG capsule, Take 1 capsule (40 mg total) by mouth 2 (two) times daily., Disp: 60 capsule, Rfl: 11 .  fluticasone (FLONASE) 50 MCG/ACT nasal spray, Place 2 sprays into both nostrils daily., Disp: 16 g, Rfl: 0 .  latanoprost (XALATAN) 0.005 % ophthalmic solution, Place 1 drop into both eyes at bedtime.  , Disp: , Rfl:  .  Meth-Hyo-M Bl-Na Phos-Ph Sal (URIBEL) 118 MG CAPS, Take 1 capsule by mouth daily as needed (urination frequency). , Disp: , Rfl:  .  Polyethyl Glycol-Propyl Glycol (SYSTANE OP), Place 1 drop into both eyes as needed (dry eyes). , Disp: , Rfl:  .  polyethylene glycol (MIRALAX / GLYCOLAX) packet, Take 17 g by mouth daily as needed. For constipation, Disp: , Rfl:  .  Probiotic Product (PROBIOTIC DAILY PO), Take 1 capsule by mouth., Disp: , Rfl:   PAST MEDICAL HISTORY: Past Medical History  Diagnosis Date  . Osteoarthritis     spine/spinal  stenosis  . GERD (gastroesophageal reflux disease)   . Hemorrhoids   . IC (irritable colon)     DMSO in past  . Allergic rhinitis, seasonal   . IBS (irritable bowel syndrome)   . Diverticulosis   . CAD (coronary artery disease) 07/2009    mod by cath 08/10  . AAA (abdominal aortic aneurysm)     followed by Dr Oneida Alar. Last u/s07/10 stable AAA with largest measurement 3.97 x 4.02cm.  . Skin cancer     basal cell cancer - face.  . Osteopenia 2011  . Hyperlipidemia   . Depression   . Asthma   . Interstitial cystitis   . History of recurrent UTIs   . Disc disease, degenerative, lumbar or lumbosacral     SPINAL STENOSIS  . Atrial fibrillation   . Complication of anesthesia   . PONV (postoperative nausea and vomiting)     PAST SURGICAL HISTORY: Past Surgical History  Procedure Laterality Date  .  Right colectomy  1997    benign  . Abdominal hysterectomy      partial not cancer  . Cystocele repair    . Rectocele repair    . Hernia repair  16/1096    umbilical hernia  . Esophagogastroduodenoscopy  09/1998    erosive esphagitis ? stricture treated 12/2002  . Refractive surgery      for film after cataract surgery.  . Skin lesion excision  05/2011    basal cell skin lesion removed.  . Breast surgery      lumpectomy  . Abdominal aortic endovascular stent graft N/A 07/01/2013    Procedure: ABDOMINAL AORTIC ENDOVASCULAR STENT GRAFT- GORE;  Surgeon: Elam Dutch, MD;  Location: [facility] OR;  Service: Vascular;  Laterality: N/A;  Ultrasound guided    FAMILY HISTORY: Family History  Problem Relation Age of Onset  . Heart disease Mother     deceased age 21 heart attack.  Marland Kitchen Heart attack Mother   . Other Mother     varicose veins  . Varicose Veins Mother   . Heart disease Father     age 48 heart attack  . Cancer Father   . Stroke Brother   . Cancer Brother   . Heart disease Brother   . Heart attack Brother   . Cancer Daughter   . Diabetes Son     SOCIAL  HISTORY:  History   Social History  . Marital Status: Married    Spouse Name: N/A  . Number of Children: N/A  . Years of Education: N/A   Occupational History  . Not on file.   Social History Main Topics  . Smoking status: Never Smoker   . Smokeless tobacco: Never Used  . Alcohol Use: No  . Drug Use: No  . Sexual Activity: Not on file   Other Topics Concern  . Not on file   Social History Narrative     PHYSICAL EXAM  Filed Vitals:   06/01/15 1317  BP: 136/64  Pulse: 68  Resp: 14  Height: 5\' 8"  (1.727 m)  Weight: 161 lb 12.8 oz (73.392 kg)    Body mass index is 24.61 kg/(m^2).   General: The patient is well-developed and well-nourished and in no acute distress  Neck: The neck is supple, no carotid bruits are noted.  The neck is nontender.  Cardiovascular: The heart has a regular rate and rhythm with a normal S1 and S2. There were no murmurs, gallops or rubs.   Skin: Extremities are without significant edema.  Neurologic Exam  Mental status: The patient is alert and oriented x 3 at the time of the examination. The patient has apparent normal recent and remote memory, with an apparently normal attention span and concentration ability.   Speech is normal.  Cranial nerves: Extraocular movements are full. Pupils are equal, round, and reactive to light and accomodation.  Visual fields are full.  Facial symmetry is present. There is good facial sensation to soft touch bilaterally.Facial strength is normal.  Trapezius and sternocleidomastoid strength is normal. Minimal  dysarthria is noted.  The tongue is midline, and the patient has symmetric elevation of the soft palate. She has mild reduced hearing bilaterally.  Motor:  Muscle bulk is normal.   Tone is normal. Strength is  5 / 5 in all 4 extremities.   Sensory: Sensory testing is intact to soft touch and vibration sensation in all 4 extremities.  Coordination: Cerebellar testing reveals good finger-nose-finger and  bilaterally.  Gait  and station: Station is normal.   Gait is normal for age.   She leans forward as she walks. Tandem gait is poor. Romberg is negative.   Reflexes: Deep tendon reflexes are symmetric and 1+ bilaterally.       DIAGNOSTIC DATA (LABS, IMAGING, TESTING) - I reviewed patient records, labs, notes, testing and imaging myself where available.  Lab Results  Component Value Date   WBC 6.5 02/08/2015   HGB 13.2 02/08/2015   HCT 39.3 02/08/2015   MCV 87.6 02/08/2015   PLT 303.0 02/08/2015      Component Value Date/Time   NA 140 02/08/2015 0820   K 3.9 02/08/2015 0820   CL 107 02/08/2015 0820   CO2 29 02/08/2015 0820   GLUCOSE 90 02/08/2015 0820   BUN 16 02/08/2015 0820   CREATININE 0.97 02/08/2015 0820   CREATININE 0.82 07/07/2014 1250   CALCIUM 9.4 02/08/2015 0820   PROT 7.1 02/08/2015 0820   ALBUMIN 3.8 02/08/2015 0820   AST 13 02/08/2015 0820   ALT 8 02/08/2015 0820   ALKPHOS 61 02/08/2015 0820   BILITOT 0.6 02/08/2015 0820   GFRNONAA 53* 01/12/2015 0154   GFRAA 61* 01/12/2015 0154   Lab Results  Component Value Date   CHOL 247* 02/08/2015   HDL 42.90 02/08/2015   LDLCALC 179* 02/08/2015   LDLDIRECT 150.5 01/06/2013   TRIG 127.0 02/08/2015   CHOLHDL 6 02/08/2015   No results found for: HGBA1C No results found for: VITAMINB12 Lab Results  Component Value Date   TSH 3.72 02/08/2015       ASSESSMENT AND PLAN  Slurred speech  Numbness  Carotid stenosis, bilateral   In summary, Kassadee Carawan is a 81 year old woman with the onset of slurred speech and tongue numbness last week. Her CT scan showed some mild chronic ischemic changes but nothing that appeared acute however, I believe she did have a small stroke based on the persistence of the symptoms. I would expect her to continue to improve over the next couple weeks without any special therapy. She has known carotid artery stenosis and CAD and PVD.  She cannot take aspirin due to severe GERD.    Her GI symptoms require that she continue to take Nexium.   I discussed with them that there is an interaction between Nexium and Plavix but that the effect actually lowers the effectiveness of Plavix so the combination would be safe.  She had her daughters understand that her spinal injections that she would need to come off for about 5 days.  I did not schedule a follow-up at this time but will be happy to see her back if any new or worsening neurologic symptoms occur. I asked her daughters to call if they note any major changes.  Shruti Arrey A. Felecia Shelling, MD, PhD 2/80/0349, 1:79 PM Certified in Neurology, Clinical Neurophysiology, Sleep Medicine, Pain Medicine and Neuroimaging  [facility] [address], [place], [place] [address] [phone]

## 2015-06-07 ENCOUNTER — Telehealth: Payer: Self-pay | Admitting: Family Medicine

## 2015-06-07 NOTE — Telephone Encounter (Signed)
Left pt vm to c/b to schedule bone density

## 2015-07-11 ENCOUNTER — Encounter: Payer: Self-pay | Admitting: Vascular Surgery

## 2015-07-14 ENCOUNTER — Ambulatory Visit (INDEPENDENT_AMBULATORY_CARE_PROVIDER_SITE_OTHER): Payer: Medicare Other | Admitting: Vascular Surgery

## 2015-07-14 ENCOUNTER — Encounter: Payer: Self-pay | Admitting: Vascular Surgery

## 2015-07-14 ENCOUNTER — Ambulatory Visit (HOSPITAL_COMMUNITY)
Admission: RE | Admit: 2015-07-14 | Discharge: 2015-07-14 | Disposition: A | Discharge status: Home / Self Care | Payer: Medicare Other | Source: Ambulatory Visit | Attending: Vascular Surgery | Admitting: Vascular Surgery

## 2015-07-14 VITALS — BP 157/52 | HR 79 | Temp 97.6°F | Resp 16 | Ht 66.5 in | Wt 160.0 lb

## 2015-07-14 DIAGNOSIS — I6523 Occlusion and stenosis of bilateral carotid arteries: Secondary | ICD-10-CM

## 2015-07-14 DIAGNOSIS — I714 Abdominal aortic aneurysm, without rupture, unspecified: Secondary | ICD-10-CM

## 2015-07-14 DIAGNOSIS — Z48812 Encounter for surgical aftercare following surgery on the circulatory system: Secondary | ICD-10-CM | POA: Diagnosis not present

## 2015-07-14 NOTE — Progress Notes (Signed)
Filed Vitals:   07/14/15 0947 07/14/15 0953  BP: 152/49 157/52  Pulse: 76 79  Temp: 97.6 F (36.4 C)   Resp: 16   Height: 5' 6.5" (1.689 m)   Weight: 160 lb (72.576 kg)   SpO2: 100%

## 2015-07-14 NOTE — Progress Notes (Signed)
Patient is a 81 year old female who underwent endovascular stent graft repair of abdominal aortic aneurysm in July 2014. Overall she has been doing well.  She denies abdominal or back pain. Her aneurysm was 5.3 cm preoperatively. She has no history of stroke TIA or amaurosis.  Review of systems: She denies shortness of breath. She denies chest pain.  Physical exam:    Filed Vitals:   07/14/15 0947 07/14/15 0953  BP: 152/49 157/52  Pulse: 76 79  Temp: 97.6 F (36.4 C)   Resp: 16   Height: 5' 6.5" (1.689 m)   Weight: 160 lb (72.576 kg)   SpO2: 100%    Abdomen: Soft nontender nondistended no pulsatile mass  Extremities.: 2+ femoral pulses bilaterally  Neck: No right carotid bruit, soft left carotid bruit  Chest:  Clear to auscultation bilaterally  Cardiac: Regular rate and rhythm   Data: AAA decreased from 5.3 cm to 3.7 cm.  Patient also had a recent carotid duplex exam which showed bilateral less than 40% stenosis. This is similar to her carotid duplex one year ago.  Assessment/Plan: Doing well status post endovascular stent graft repair. The patient will have a repeat aortic ultrasound in one year.    Ruta Hinds, MD [facility] Office: [phone] Pager: [phone]

## 2015-07-19 DIAGNOSIS — H4011X3 Primary open-angle glaucoma, severe stage: Secondary | ICD-10-CM | POA: Diagnosis not present

## 2015-07-19 DIAGNOSIS — H01001 Unspecified blepharitis right upper eyelid: Secondary | ICD-10-CM | POA: Diagnosis not present

## 2015-07-19 DIAGNOSIS — H01004 Unspecified blepharitis left upper eyelid: Secondary | ICD-10-CM | POA: Diagnosis not present

## 2015-07-19 DIAGNOSIS — Z0100 Encounter for examination of eyes and vision without abnormal findings: Secondary | ICD-10-CM | POA: Diagnosis not present

## 2015-07-22 ENCOUNTER — Telehealth: Payer: Self-pay

## 2015-07-22 NOTE — Telephone Encounter (Signed)
phone call from pt's daughter.  Reported pt. has had a lot of discomfort since her abdominal ultrasound last week.   Reported that the abdominal ultrasound caused her mother a lot of discomfort last year, also.  Reported pt. had some nausea and diarrhea after the ultrasound of the abdomen.  Denied fever/ chills.  Stated "she doesn't tolerate anything that creates pressure to her stomach area."  Reported she has IBS, and Interstitial Cystitis.  Questioned if a CT scan can be done prior to her annual office exam next year, instead of the ultrasound?  Advised will discuss with Dr. Oneida Alar, and return call next week when he is in the office.  Verb. Understanding.

## 2015-07-25 ENCOUNTER — Ambulatory Visit (INDEPENDENT_AMBULATORY_CARE_PROVIDER_SITE_OTHER): Payer: Medicare Other | Admitting: Primary Care

## 2015-07-25 ENCOUNTER — Encounter: Payer: Self-pay | Admitting: Primary Care

## 2015-07-25 VITALS — BP 126/70 | HR 80 | Temp 97.4°F | Ht 66.5 in | Wt 153.8 lb

## 2015-07-25 DIAGNOSIS — R319 Hematuria, unspecified: Principal | ICD-10-CM

## 2015-07-25 DIAGNOSIS — N390 Urinary tract infection, site not specified: Secondary | ICD-10-CM | POA: Diagnosis not present

## 2015-07-25 LAB — POCT URINALYSIS DIPSTICK
Bilirubin, UA: NEGATIVE
Glucose, UA: NEGATIVE
Ketones, UA: NEGATIVE
Nitrite, UA: NEGATIVE
PROTEIN UA: NEGATIVE
Spec Grav, UA: 1.01
Urobilinogen, UA: NEGATIVE
pH, UA: 6

## 2015-07-25 MED ORDER — SULFAMETHOXAZOLE-TRIMETHOPRIM 800-160 MG PO TABS: 1.0000 | ORAL_TABLET | Freq: Two times a day (BID) | ORAL | Status: DC

## 2015-07-25 MED ORDER — PHENAZOPYRIDINE HCL 200 MG PO TABS: 200.0000 mg | ORAL_TABLET | Freq: Three times a day (TID) | ORAL | Status: DC | PRN

## 2015-07-25 NOTE — Addendum Note (Signed)
Addended by: Jacqualin Combes on: 07/25/2015 11:29 AM   Modules accepted: Orders

## 2015-07-25 NOTE — Patient Instructions (Signed)
Start Bactrim DS tablets. Take 1 tablet by mouth twice daily for 6 days.  You may take the pyridium tablets as needed for burning/pain with urination. Take 1 tablet by mouth three times daily as needed. This will make your urine change to an orange color.  Follow up if no improvement in symptoms.  It was a pleasure to see you today!  Urinary Tract Infection Urinary tract infections (UTIs) can develop anywhere along your urinary tract. Your urinary tract is your body's drainage system for removing wastes and extra water. Your urinary tract includes two kidneys, two ureters, a bladder, and a urethra. Your kidneys are a pair of bean-shaped organs. Each kidney is about the size of your fist. They are located below your ribs, one on each side of your spine. CAUSES Infections are caused by microbes, which are microscopic organisms, including fungi, viruses, and bacteria. These organisms are so small that they can only be seen through a microscope. Bacteria are the microbes that most commonly cause UTIs. SYMPTOMS  Symptoms of UTIs may vary by age and gender of the patient and by the location of the infection. Symptoms in young women typically include a frequent and intense urge to urinate and a painful, burning feeling in the bladder or urethra during urination. Older women and men are more likely to be tired, shaky, and weak and have muscle aches and abdominal pain. A fever may mean the infection is in your kidneys. Other symptoms of a kidney infection include pain in your back or sides below the ribs, nausea, and vomiting. DIAGNOSIS To diagnose a UTI, your caregiver will ask you about your symptoms. Your caregiver also will ask to provide a urine sample. The urine sample will be tested for bacteria and white blood cells. White blood cells are made by your body to help fight infection. TREATMENT  Typically, UTIs can be treated with medication. Because most UTIs are caused by a bacterial infection, they  usually can be treated with the use of antibiotics. The choice of antibiotic and length of treatment depend on your symptoms and the type of bacteria causing your infection. HOME CARE INSTRUCTIONS  If you were prescribed antibiotics, take them exactly as your caregiver instructs you. Finish the medication even if you feel better after you have only taken some of the medication.  Drink enough water and fluids to keep your urine clear or pale yellow.  Avoid caffeine, tea, and carbonated beverages. They tend to irritate your bladder.  Empty your bladder often. Avoid holding urine for long periods of time.  Empty your bladder before and after sexual intercourse.  After a bowel movement, women should cleanse from front to back. Use each tissue only once. SEEK MEDICAL CARE IF:   You have back pain.  You develop a fever.  Your symptoms do not begin to resolve within 3 days. SEEK IMMEDIATE MEDICAL CARE IF:   You have severe back pain or lower abdominal pain.  You develop chills.  You have nausea or vomiting.  You have continued burning or discomfort with urination. MAKE SURE YOU:   Understand these instructions.  Will watch your condition.  Will get help right away if you are not doing well or get worse. Document Released: 09/12/2005 Document Revised: 06/03/2012 Document Reviewed: 01/11/2012 [facility] Patient Information 2015 [place], [place]. This information is not intended to replace advice given to you by your health care provider. Make sure you discuss any questions you have with your health care provider.

## 2015-07-25 NOTE — Progress Notes (Signed)
Subjective:    Patient ID: Ashlee Reyes, female    DOB: May 30, 1921, 81 y.o.   MRN: [id number]  HPI  Ashlee Reyes is a 81 year old female who presents today with a chief complaint of urinary frequency and dysuria. Her symptoms have been present for the past 2+ weeks with worsening symptoms over the last 6 days. She also reports some difficulty initiating her urine stream. Denies fevers, vaginal discharge, hematuria.   Review of Systems  Constitutional: Negative for fever and chills.  Gastrointestinal: Positive for nausea. Negative for abdominal pain.  Genitourinary: Positive for dysuria, frequency and difficulty urinating.       Past Medical History  Diagnosis Date  . Osteoarthritis     spine/spinal stenosis  . GERD (gastroesophageal reflux disease)   . Hemorrhoids   . IC (irritable colon)     DMSO in past  . Allergic rhinitis, seasonal   . IBS (irritable bowel syndrome)   . Diverticulosis   . CAD (coronary artery disease) 07/2009    mod by cath 08/10  . AAA (abdominal aortic aneurysm)     followed by Dr Oneida Alar. Last u/s07/10 stable AAA with largest measurement 3.97 x 4.02cm.  . Skin cancer     basal cell cancer - face.  . Osteopenia 2011  . Hyperlipidemia   . Depression   . Asthma   . Interstitial cystitis   . History of recurrent UTIs   . Disc disease, degenerative, lumbar or lumbosacral     SPINAL STENOSIS  . Atrial fibrillation   . Complication of anesthesia   . PONV (postoperative nausea and vomiting)     History   Social History  . Marital Status: Married    Spouse Name: N/A  . Number of Children: N/A  . Years of Education: N/A   Occupational History  . Not on file.   Social History Main Topics  . Smoking status: Never Smoker   . Smokeless tobacco: Never Used  . Alcohol Use: No  . Drug Use: No  . Sexual Activity: Not on file   Other Topics Concern  . Not on file   Social History Narrative    Past Surgical History  Procedure  Laterality Date  . Right colectomy  1997    benign  . Abdominal hysterectomy      partial not cancer  . Cystocele repair    . Rectocele repair    . Hernia repair  [id number]    umbilical hernia  . Esophagogastroduodenoscopy  09/1998    erosive esphagitis ? stricture treated 12/2002  . Refractive surgery      for film after cataract surgery.  . Skin lesion excision  05/2011    basal cell skin lesion removed.  . Breast surgery      lumpectomy  . Abdominal aortic endovascular stent graft N/A 07/01/2013    Procedure: ABDOMINAL AORTIC ENDOVASCULAR STENT GRAFT- GORE;  Surgeon: Elam Dutch, MD;  Location: [facility] OR;  Service: Vascular;  Laterality: N/A;  Ultrasound guided    Family History  Problem Relation Age of Onset  . Heart disease Mother     deceased age 3 heart attack.  Marland Kitchen Heart attack Mother   . Other Mother     varicose veins  . Varicose Veins Mother   . Heart disease Father     age 21 heart attack  . Cancer Father   . Stroke Brother   . Cancer Brother   . Heart disease Brother   .  Heart attack Brother   . Cancer Daughter   . Diabetes Son     Allergies  Allergen Reactions  . Amoxicillin-Pot Clavulanate     REACTION: sick  . Avelox [Moxifloxacin Hcl In Nacl] Nausea Only  . Ibuprofen     REACTION: GI  . Keflex [Cephalexin]     abd pain / GI upset  . Nitrofurantoin Other (See Comments)    Chest pain or indigestion with nausea  . Omeprazole Nausea Only  . Prevnar [Pneumococcal 13-Val Conj Vacc] Itching    Severe local reaction with redness and itching   . Rosuvastatin     REACTION: foot pain  . Sertraline Hcl     REACTION: more depressed  . Tetanus Toxoids     Local redness and swelling     Current Outpatient Prescriptions on File Prior to Visit  Medication Sig Dispense Refill  . acetaminophen (TYLENOL) 500 MG tablet Take 500 mg by mouth every 4 (four) hours as needed for mild pain or moderate pain. For pain    . albuterol (PROAIR HFA) 108 (90 BASE) MCG/ACT  inhaler Inhale 2 puffs into the lungs every 4 (four) hours as needed for wheezing. 2 puffs up to every 4 hours as needed for wheezing (Patient taking differently: Inhale 1 puff into the lungs every 4 (four) hours as needed for wheezing. 2 puffs up to every 4 hours as needed for wheezing) 1 Inhaler 11  . beclomethasone (QVAR) 80 MCG/ACT inhaler Inhale 1 puff into the lungs 2 (two) times daily. 1 Inhaler 11  . cetirizine (ZYRTEC) 10 MG tablet Take 10 mg by mouth daily as needed. For allergies    . clopidogrel (PLAVIX) 75 MG tablet Take 1 tablet (75 mg total) by mouth daily. 30 tablet 11  . esomeprazole (NEXIUM) 40 MG capsule Take 1 capsule (40 mg total) by mouth 2 (two) times daily. 60 capsule 11  . latanoprost (XALATAN) 0.005 % ophthalmic solution Place 1 drop into both eyes at bedtime.      . Meth-Hyo-M Bl-Na Phos-Ph Sal (URIBEL) 118 MG CAPS Take 1 capsule by mouth daily as needed (urination frequency).     [name] Glycol-Propyl Glycol (SYSTANE OP) Place 1 drop into both eyes as needed (dry eyes).     . polyethylene glycol (MIRALAX / GLYCOLAX) packet Take 17 g by mouth daily as needed. For constipation    . Probiotic Product (PROBIOTIC DAILY PO) Take 1 capsule by mouth.    . fluticasone (FLONASE) 50 MCG/ACT nasal spray Place 2 sprays into both nostrils daily. (Patient not taking: Reported on 07/25/2015) 16 g 0   No current facility-administered medications on file prior to visit.    BP 126/70 mmHg  Pulse 80  Temp(Src) 97.4 F (36.3 C) (Oral)  Ht 5' 6.5" (1.689 m)  Wt 153 lb 12.8 oz (69.763 kg)  BMI 24.45 kg/m2  SpO2 98%  LMP 12/17/1968    Objective:   Physical Exam  Constitutional: She is oriented to person, place, and time. She appears well-nourished. She does not appear ill.  Cardiovascular: Normal rate and regular rhythm.   Pulmonary/Chest: Effort normal and breath sounds normal.  Abdominal: Soft. Normal appearance and bowel sounds are normal. There is no tenderness. There is no  CVA tenderness.  Neurological: She is alert and oriented to person, place, and time.  Skin: Skin is warm and dry.          Assessment & Plan:  Urinary tract infection:  Dysuria and increased urinary  frequency over last 6 days. UA: Positive for leuks and blood. Negative for nitrites and glucose. RX for Bactrim DS BID x 6 days with pyridium PRN. Culture sent.  Follow up if no improvement in symptoms.

## 2015-07-25 NOTE — Progress Notes (Signed)
Pre visit review using our clinic review tool, if applicable. No additional management support is needed unless otherwise documented below in the visit note. 

## 2015-07-25 NOTE — Addendum Note (Signed)
Addended by: Jacqualin Combes on: 07/25/2015 10:29 AM   Modules accepted: Orders

## 2015-07-28 LAB — URINE CULTURE: Colony Count: >=100000

## 2015-08-11 ENCOUNTER — Encounter: Payer: Self-pay | Admitting: Internal Medicine

## 2015-08-11 ENCOUNTER — Ambulatory Visit (INDEPENDENT_AMBULATORY_CARE_PROVIDER_SITE_OTHER): Payer: Medicare Other | Admitting: Internal Medicine

## 2015-08-11 VITALS — BP 124/72 | HR 54 | Temp 97.4°F | Wt 161.0 lb

## 2015-08-11 DIAGNOSIS — R300 Dysuria: Secondary | ICD-10-CM

## 2015-08-11 DIAGNOSIS — N3090 Cystitis, unspecified without hematuria: Secondary | ICD-10-CM | POA: Diagnosis not present

## 2015-08-11 DIAGNOSIS — R350 Frequency of micturition: Secondary | ICD-10-CM | POA: Diagnosis not present

## 2015-08-11 DIAGNOSIS — I6523 Occlusion and stenosis of bilateral carotid arteries: Secondary | ICD-10-CM

## 2015-08-11 LAB — POCT URINALYSIS DIPSTICK
Bilirubin, UA: NEGATIVE
GLUCOSE UA: NEGATIVE
KETONES UA: NEGATIVE
Leukocytes, UA: NEGATIVE
Nitrite, UA: NEGATIVE
Protein, UA: NEGATIVE
RBC UA: NEGATIVE
SPEC GRAV UA: 1.015
UROBILINOGEN UA: NEGATIVE
pH, UA: 5.5

## 2015-08-11 NOTE — Progress Notes (Signed)
Subjective:    Patient ID: Ashlee Reyes, female    DOB: 1921-03-24, 81 y.o.   MRN: [id number]  HPI Ashlee Reyes is a 81 year old female who presents today with chief complaint of urinary frequency and dysuria.  She was treated for a UTI on 07/25/15 with Bactrim DS.  Today her urine is negative for UTI.  She denies fever, chills and malaise.     Review of Systems  Constitutional: Negative for fever, chills and fatigue.  HENT: Negative.   Respiratory: Negative for cough, shortness of breath and wheezing.   Cardiovascular: Negative for chest pain, palpitations and leg swelling.  Gastrointestinal: Negative.   Genitourinary: Positive for dysuria, urgency and frequency.  Musculoskeletal: Negative for myalgias and back pain.  Skin: Negative for color change, pallor, rash and wound.  Neurological: Negative for dizziness, light-headedness and headaches.  Psychiatric/Behavioral: Negative.    Family History  Problem Relation Age of Onset  . Heart disease Mother     deceased age 47 heart attack.  Marland Kitchen Heart attack Mother   . Other Mother     varicose veins  . Varicose Veins Mother   . Heart disease Father     age 96 heart attack  . Cancer Father   . Stroke Brother   . Cancer Brother   . Heart disease Brother   . Heart attack Brother   . Cancer Daughter   . Diabetes Son    Current Outpatient Prescriptions on File Prior to Visit  Medication Sig Dispense Refill  . acetaminophen (TYLENOL) 500 MG tablet Take 500 mg by mouth every 4 (four) hours as needed for mild pain or moderate pain. For pain    . albuterol (PROAIR HFA) 108 (90 BASE) MCG/ACT inhaler Inhale 2 puffs into the lungs every 4 (four) hours as needed for wheezing. 2 puffs up to every 4 hours as needed for wheezing (Patient taking differently: Inhale 1 puff into the lungs every 4 (four) hours as needed for wheezing. 2 puffs up to every 4 hours as needed for wheezing) 1 Inhaler 11  . beclomethasone (QVAR) 80 MCG/ACT inhaler  Inhale 1 puff into the lungs 2 (two) times daily. 1 Inhaler 11  . cetirizine (ZYRTEC) 10 MG tablet Take 10 mg by mouth daily as needed. For allergies    . clopidogrel (PLAVIX) 75 MG tablet Take 1 tablet (75 mg total) by mouth daily. 30 tablet 11  . esomeprazole (NEXIUM) 40 MG capsule Take 1 capsule (40 mg total) by mouth 2 (two) times daily. 60 capsule 11  . fluticasone (FLONASE) 50 MCG/ACT nasal spray Place 2 sprays into both nostrils daily. 16 g 0  . latanoprost (XALATAN) 0.005 % ophthalmic solution Place 1 drop into both eyes at bedtime.      . Meth-Hyo-M Bl-Na Phos-Ph Sal (URIBEL) 118 MG CAPS Take 1 capsule by mouth daily as needed (urination frequency).     . phenazopyridine (PYRIDIUM) 200 MG tablet Take 1 tablet (200 mg total) by mouth 3 (three) times daily as needed for pain. 10 tablet 0  . Polyethyl Glycol-Propyl Glycol (SYSTANE OP) Place 1 drop into both eyes as needed (dry eyes).     . polyethylene glycol (MIRALAX / GLYCOLAX) packet Take 17 g by mouth daily as needed. For constipation    . Probiotic Product (PROBIOTIC DAILY PO) Take 1 capsule by mouth.    . sulfamethoxazole-trimethoprim (BACTRIM DS,SEPTRA DS) 800-160 MG per tablet Take 1 tablet by mouth 2 (two) times daily. 12  tablet 0   No current facility-administered medications on file prior to visit.        Objective:   Physical Exam  Constitutional: She is oriented to person, place, and time. She appears well-developed and well-nourished.  HENT:  Head: Normocephalic and atraumatic.  Eyes: Pupils are equal, round, and reactive to light.  Neck: Normal range of motion. Neck supple.  Cardiovascular: Normal rate, regular rhythm and normal heart sounds.   No murmur heard. Pulmonary/Chest: Effort normal and breath sounds normal.  Abdominal: Soft. Bowel sounds are normal.  Musculoskeletal: Normal range of motion.  Lymphadenopathy:    She has no cervical adenopathy.  Neurological: She is alert and oriented to person, place, and  time.  Skin: Skin is warm and dry.  Psychiatric: She has a normal mood and affect.          Assessment & Plan:  1. Urinary frequency  - Urine is negative.  Encouraged adequate hydration. Continue to take Uribel.  Instructed patient to call office if symptoms worsen.  She is followed by urology and advised patient to follow up with  urology if symptoms persist.

## 2015-08-11 NOTE — Patient Instructions (Addendum)

## 2015-08-11 NOTE — Progress Notes (Signed)
Pre visit review using our clinic review tool, if applicable. No additional management support is needed unless otherwise documented below in the visit note. 

## 2015-08-11 NOTE — Progress Notes (Signed)
HPI  Pt presents to the clinic today with c/o urinary frequency and dysuria. This started 2-3 days ago. She has had some associated nausea but denies fever, chills or body aches. She has taken Uribel OTC.She was seen 8/8 for the same, and treated with 5 days of Septra. Her culture at that time grew out Klebsiella Pneumoniae. She has finished the antibiotic and reports her symptoms improved but never completely went away. She has a history of interstitial cystitis. She denies vaginal complaints. She does have a urologist, Dr. Earleen Newport.   Review of Systems  Past Medical History  Diagnosis Date  . Osteoarthritis     spine/spinal stenosis  . GERD (gastroesophageal reflux disease)   . Hemorrhoids   . IC (irritable colon)     DMSO in past  . Allergic rhinitis, seasonal   . IBS (irritable bowel syndrome)   . Diverticulosis   . CAD (coronary artery disease) 07/2009    mod by cath 08/10  . AAA (abdominal aortic aneurysm)     followed by Dr Oneida Alar. Last u/s07/10 stable AAA with largest measurement 3.97 x 4.02cm.  . Skin cancer     basal cell cancer - face.  . Osteopenia 2011  . Hyperlipidemia   . Depression   . Asthma   . Interstitial cystitis   . History of recurrent UTIs   . Disc disease, degenerative, lumbar or lumbosacral     SPINAL STENOSIS  . Atrial fibrillation   . Complication of anesthesia   . PONV (postoperative nausea and vomiting)     Family History  Problem Relation Age of Onset  . Heart disease Mother     deceased age 35 heart attack.  Marland Kitchen Heart attack Mother   . Other Mother     varicose veins  . Varicose Veins Mother   . Heart disease Father     age 48 heart attack  . Cancer Father   . Stroke Brother   . Cancer Brother   . Heart disease Brother   . Heart attack Brother   . Cancer Daughter   . Diabetes Son     Social History   Social History  . Marital Status: Married    Spouse Name: N/A  . Number of Children: N/A  . Years of Education: N/A    Occupational History  . Not on file.   Social History Main Topics  . Smoking status: Never Smoker   . Smokeless tobacco: Never Used  . Alcohol Use: No  . Drug Use: No  . Sexual Activity: Not on file   Other Topics Concern  . Not on file   Social History Narrative    Allergies  Allergen Reactions  . Amoxicillin-Pot Clavulanate     REACTION: sick  . Avelox [Moxifloxacin Hcl In Nacl] Nausea Only  . Ibuprofen     REACTION: GI  . Keflex [Cephalexin]     abd pain / GI upset  . Nitrofurantoin Other (See Comments)    Chest pain or indigestion with nausea  . Omeprazole Nausea Only  . Prevnar [Pneumococcal 13-Val Conj Vacc] Itching    Severe local reaction with redness and itching   . Rosuvastatin     REACTION: foot pain  . Sertraline Hcl     REACTION: more depressed  . Tetanus Toxoids     Local redness and swelling     Constitutional: Denies fever, malaise, fatigue, headache or abrupt weight changes.   GU: Pt reports frequency and pain with urination. Denies  urgency, burning sensation, blood in urine, odor or discharge. Skin: Denies redness, rashes, lesions or ulcercations.   No other specific complaints in a complete review of systems (except as listed in HPI above).    Objective:   Physical Exam  BP 124/72 mmHg  Pulse 54  Temp(Src) 97.4 F (36.3 C) (Oral)  Wt 161 lb (73.029 kg)  SpO2 98%  LMP 12/17/1968  Wt Readings from Last 3 Encounters:  07/25/15 153 lb 12.8 oz (69.763 kg)  07/14/15 160 lb (72.576 kg)  06/01/15 161 lb 12.8 oz (73.392 kg)    General: Appears her stated age,  in NAD. Cardiovascular: Normal rate and rhythm. S1,S2 noted.  No murmur, rubs or gallops noted.  Pulmonary/Chest: Normal effort and positive vesicular breath sounds. No respiratory distress. No wheezes, rales or ronchi noted.  Abdomen: Soft and nontender. Normal bowel sounds, no bruits noted. No distention or masses noted. Liver, spleen and kidneys non palpable. No CVA  tenderness.      Assessment & Plan:  Frequency, Dysuria secondary to Cystitis:  Urinalysis: normal Will send urine culture to double check Continue Uribel as needed Drink plenty of fluids  RTC as needed or if symptoms persist.

## 2015-08-11 NOTE — Addendum Note (Signed)
Addended by: Lurlean Nanny on: 08/11/2015 04:49 PM   Modules accepted: Orders

## 2015-08-12 LAB — URINE CULTURE

## 2015-11-16 ENCOUNTER — Other Ambulatory Visit: Payer: Self-pay | Admitting: Neurology

## 2015-11-16 MED ORDER — RANITIDINE HCL 300 MG PO TABS: 300.0000 mg | ORAL_TABLET | Freq: Every day | ORAL | Status: DC

## 2015-11-22 DIAGNOSIS — M47816 Spondylosis without myelopathy or radiculopathy, lumbar region: Secondary | ICD-10-CM | POA: Diagnosis not present

## 2015-11-22 DIAGNOSIS — M4806 Spinal stenosis, lumbar region: Secondary | ICD-10-CM | POA: Diagnosis not present

## 2015-11-29 DIAGNOSIS — M47816 Spondylosis without myelopathy or radiculopathy, lumbar region: Secondary | ICD-10-CM | POA: Diagnosis not present

## 2015-12-28 ENCOUNTER — Telehealth: Payer: Self-pay

## 2015-12-28 ENCOUNTER — Telehealth: Payer: Self-pay | Admitting: Neurology

## 2015-12-28 MED ORDER — ESOMEPRAZOLE MAGNESIUM 40 MG PO CPDR: 40.0000 mg | DELAYED_RELEASE_CAPSULE | Freq: Every day | ORAL | Status: DC

## 2015-12-28 MED ORDER — CLOPIDOGREL BISULFATE 75 MG PO TABS: 75.0000 mg | ORAL_TABLET | Freq: Every day | ORAL | Status: DC

## 2015-12-28 NOTE — Telephone Encounter (Signed)
Ashlee Reyes pts daughter left v/m; pt has ins change and needs new rx nexium 40 mg taking one capsule by mouth daily to [facility]. Is it OK to change instructions. Linda request cb when refilled. 03/*12/2014 last annual exam.

## 2015-12-28 NOTE — Telephone Encounter (Signed)
Rx has been sent.  Receipt confirmed by pharmacy.   

## 2015-12-28 NOTE — Telephone Encounter (Signed)
Rx sent and daughter [name] notified and advise to update [place] if sxs worsen with new dose of med

## 2015-12-28 NOTE — Telephone Encounter (Signed)
That is fine  Refill for a year If just taking it once daily is not enough to keep symptoms away please let me know

## 2015-12-28 NOTE — Telephone Encounter (Signed)
Pt needs refill on clopidogrel (PLAVIX) 75 MG tablet. Pt has new insurance and needs to change pharmacies - Please send to [facility]. Phone: [phone]

## 2015-12-29 DIAGNOSIS — M1712 Unilateral primary osteoarthritis, left knee: Secondary | ICD-10-CM | POA: Diagnosis not present

## 2015-12-29 DIAGNOSIS — M7062 Trochanteric bursitis, left hip: Secondary | ICD-10-CM | POA: Diagnosis not present

## 2015-12-29 NOTE — Telephone Encounter (Signed)
[name] said Nexium was not at [facility]; spoke with Bea at [facility] and pt has 2 profiles; did received the Nexium DAW and cost to pt is $250.00. [name] said pt has $400.00 deductible and [name] [place] to [place].

## 2016-01-06 ENCOUNTER — Other Ambulatory Visit (INDEPENDENT_AMBULATORY_CARE_PROVIDER_SITE_OTHER): Payer: Medicare Other

## 2016-01-06 ENCOUNTER — Telehealth: Payer: Self-pay | Admitting: *Deleted

## 2016-01-06 DIAGNOSIS — R300 Dysuria: Secondary | ICD-10-CM | POA: Diagnosis not present

## 2016-01-06 DIAGNOSIS — R8290 Unspecified abnormal findings in urine: Secondary | ICD-10-CM

## 2016-01-06 DIAGNOSIS — R8299 Other abnormal findings in urine: Secondary | ICD-10-CM | POA: Diagnosis not present

## 2016-01-06 LAB — POC URINALSYSI DIPSTICK (AUTOMATED)
Bilirubin, UA: NEGATIVE
Blood, UA: 10
Glucose, UA: NEGATIVE
Ketones, UA: NEGATIVE
Nitrite, UA: NEGATIVE
PROTEIN UA: NEGATIVE
SPEC GRAV UA: 1.01
UROBILINOGEN UA: 0.2
pH, UA: 6

## 2016-01-06 MED ORDER — SULFAMETHOXAZOLE-TRIMETHOPRIM 800-160 MG PO TABS: 1.0000 | ORAL_TABLET | Freq: Two times a day (BID) | ORAL | Status: DC

## 2016-01-06 NOTE — Telephone Encounter (Signed)
Since her symptoms are so severe-please call in bactrim

## 2016-01-06 NOTE — Telephone Encounter (Signed)
I advise [name] ([facility]) of UA results and Dr. Marliss Coots comments about waiting until we get urine cx back before starting abx, [name] said pt is going to the bathroom very frequently sometimes every 15-20 min and she wants to know what we can do over the weekend for her until she gets the culture back to give pt some relief

## 2016-01-08 LAB — URINE CULTURE

## 2016-01-09 ENCOUNTER — Ambulatory Visit (INDEPENDENT_AMBULATORY_CARE_PROVIDER_SITE_OTHER): Payer: Medicare Other | Admitting: Family Medicine

## 2016-01-09 ENCOUNTER — Encounter: Payer: Self-pay | Admitting: Family Medicine

## 2016-01-09 VITALS — BP 128/76 | HR 86 | Temp 97.6°F | Ht 66.5 in | Wt 158.0 lb

## 2016-01-09 DIAGNOSIS — N3010 Interstitial cystitis (chronic) without hematuria: Secondary | ICD-10-CM | POA: Diagnosis not present

## 2016-01-09 DIAGNOSIS — N390 Urinary tract infection, site not specified: Secondary | ICD-10-CM

## 2016-01-09 DIAGNOSIS — I959 Hypotension, unspecified: Secondary | ICD-10-CM

## 2016-01-09 MED ORDER — SULFAMETHOXAZOLE-TRIMETHOPRIM 800-160 MG PO TABS: 1.0000 | ORAL_TABLET | Freq: Two times a day (BID) | ORAL | Status: DC

## 2016-01-09 MED ORDER — URIBEL 118 MG PO CAPS: 1.0000 | ORAL_CAPSULE | Freq: Three times a day (TID) | ORAL | Status: DC | PRN

## 2016-01-09 NOTE — Progress Notes (Signed)
Subjective:    Patient ID: Ashlee Reyes, female    DOB: 06-Jun-1921, 82 y.o.   MRN: [id number]  HPI 82 yo female with past hx of CAD/ repaired AAA, carotid stenosis and 1 deg heart block presents with low bp   Has had episodes of orthostatic hypotension in the past  Saw Dr Cathie Olden last a year ago  BP Readings from Last 3 Encounters:  01/09/16 128/76  08/11/15 124/72  07/25/15 126/70   At home -sometimes as low as 82/66 Usually feels tired/sleepy and generally weak and light headed  Family takes her pulse - thinks it is regular and normal rate   She does eat a fair amt of salt and does not limit it    Wt is down 3 lb with bmi of 25  Has uri= enterobacter  Px bactrim-cx returned today A lot of urgency and frequency  Has IC also - used to see urology- and she needs refill on uribrel  Makes an effort to drink water  2 glasses am  Several during the day  2 at dinner  Some at bedtime Some milk and some decaf coffee   Patient Active Problem List   Diagnosis Date Noted  . Slurred speech 06/01/2015  . Numbness 06/01/2015  . Carotid stenosis 06/01/2015  . Local reaction to immunization 02/21/2015  . Encounter for Medicare annual wellness exam 02/15/2015  . Estrogen deficiency 02/15/2015  . Sinusitis, chronic 01/12/2015  . Orthostatic dizziness 01/12/2015  . IBS (irritable bowel syndrome) 01/12/2015  . Orthostatic hypotension 01/11/2015  . Atypical chest pain 01/11/2015  . Dizziness   . Pulsatile tinnitus of left ear 07/08/2014  . Palpitations 05/13/2014  . Diarrhea 12/23/2013  . Gastritis 10/28/2013  . Second degree heart block 10/18/2013  . Syncope 10/17/2013  . UTI (lower urinary tract infection) 10/17/2013  . Abdominal aneurysm without mention of rupture 06/25/2013  . Osteopenia 01/13/2013  . CAD (coronary artery disease)   . Chronic depression 06/03/2007  . GERD 06/02/2007  . Hyperlipidemia   . Asthma   . Irritable bowel syndrome   . Chronic  interstitial cystitis   . Osteoarthritis   . Spinal stenosis of lumbar region   . History of urinary tract infection    Past Medical History  Diagnosis Date  . Osteoarthritis     spine/spinal stenosis  . GERD (gastroesophageal reflux disease)   . Hemorrhoids   . IC (irritable colon)     DMSO in past  . Allergic rhinitis, seasonal   . IBS (irritable bowel syndrome)   . Diverticulosis   . CAD (coronary artery disease) 07/2009    mod by cath 08/10  . AAA (abdominal aortic aneurysm) ([place])     followed by Dr Oneida Alar. Last u/s07/10 stable AAA with largest measurement 3.97 x 4.02cm.  . Skin cancer     basal cell cancer - face.  . Osteopenia 2011  . Hyperlipidemia   . Depression   . Asthma   . Interstitial cystitis   . History of recurrent UTIs   . Disc disease, degenerative, lumbar or lumbosacral     SPINAL STENOSIS  . Atrial fibrillation ([place])   . Complication of anesthesia   . PONV (postoperative nausea and vomiting)    Past Surgical History  Procedure Laterality Date  . Right colectomy  1997    benign  . Abdominal hysterectomy      partial not cancer  . Cystocele repair    . Rectocele repair    .  Hernia repair  [id number]    umbilical hernia  . Esophagogastroduodenoscopy  09/1998    erosive esphagitis ? stricture treated 12/2002  . Refractive surgery      for film after cataract surgery.  . Skin lesion excision  05/2011    basal cell skin lesion removed.  . Breast surgery      lumpectomy  . Abdominal aortic endovascular stent graft N/A 07/01/2013    Procedure: ABDOMINAL AORTIC ENDOVASCULAR STENT GRAFT- GORE;  Surgeon: Elam Dutch, MD;  Location: [facility];  Service: Vascular;  Laterality: N/A;  Ultrasound guided   Social History  Substance Use Topics  . Smoking status: Never Smoker   . Smokeless tobacco: Never Used  . Alcohol Use: No   Family History  Problem Relation Age of Onset  . Heart disease Mother     deceased age 59 heart attack.  Marland Kitchen Heart attack Mother    . Other Mother     varicose veins  . Varicose Veins Mother   . Heart disease Father     age 31 heart attack  . Cancer Father   . Stroke Brother   . Cancer Brother   . Heart disease Brother   . Heart attack Brother   . Cancer Daughter   . Diabetes Son    Allergies  Allergen Reactions  . Amoxicillin-Pot Clavulanate     REACTION: sick  . Avelox [Moxifloxacin Hcl In Nacl] Nausea Only  . Ibuprofen     REACTION: GI  . Keflex [Cephalexin]     abd pain / GI upset  . Nitrofurantoin Other (See Comments)    Chest pain or indigestion with nausea  . Omeprazole Nausea Only  . Prevnar [Pneumococcal 13-Val Conj Vacc] Itching    Severe local reaction with redness and itching   . Rosuvastatin     REACTION: foot pain  . Sertraline Hcl     REACTION: more depressed  . Tetanus Toxoids     Local redness and swelling    Current Outpatient Prescriptions on File Prior to Visit  Medication Sig Dispense Refill  . acetaminophen (TYLENOL) 500 MG tablet Take 500 mg by mouth every 4 (four) hours as needed for mild pain or moderate pain. For pain    . albuterol (PROAIR HFA) 108 (90 BASE) MCG/ACT inhaler Inhale 2 puffs into the lungs every 4 (four) hours as needed for wheezing. 2 puffs up to every 4 hours as needed for wheezing (Patient taking differently: Inhale 1 puff into the lungs every 4 (four) hours as needed for wheezing. 2 puffs up to every 4 hours as needed for wheezing) 1 Inhaler 11  . beclomethasone (QVAR) 80 MCG/ACT inhaler Inhale 1 puff into the lungs 2 (two) times daily. 1 Inhaler 11  . cetirizine (ZYRTEC) 10 MG tablet Take 10 mg by mouth daily as needed. For allergies    . clopidogrel (PLAVIX) 75 MG tablet Take 1 tablet (75 mg total) by mouth daily. 30 tablet 5  . esomeprazole (NEXIUM) 40 MG capsule Take 1 capsule (40 mg total) by mouth daily. 30 capsule 11  . fluticasone (FLONASE) 50 MCG/ACT nasal spray Place 2 sprays into both nostrils daily. 16 g 0  . latanoprost (XALATAN) 0.005 %  ophthalmic solution Place 1 drop into both eyes at bedtime.      . Meth-Hyo-M Bl-Na Phos-Ph Sal (URIBEL) 118 MG CAPS Take 1 capsule by mouth daily as needed (urination frequency).     [name] Glycol-Propyl Glycol (SYSTANE OP) Place 1  drop into both eyes as needed (dry eyes).     . polyethylene glycol (MIRALAX / GLYCOLAX) packet Take 17 g by mouth daily as needed. For constipation    . Probiotic Product (PROBIOTIC DAILY PO) Take 1 capsule by mouth.    . ranitidine (ZANTAC) 300 MG tablet Take 1 tablet (300 mg total) by mouth at bedtime. 30 tablet 2  . sulfamethoxazole-trimethoprim (BACTRIM DS,SEPTRA DS) 800-160 MG tablet Take 1 tablet by mouth 2 (two) times daily. 10 tablet 0   No current facility-administered medications on file prior to visit.      Review of Systems Review of Systems  Constitutional: Negative for fever, appetite change, fatigue and unexpected weight change.  Eyes: Negative for pain and visual disturbance.  Respiratory: Negative for cough and shortness of breath.   Cardiovascular: Negative for cp or palpitations    Gastrointestinal: Negative for nausea, diarrhea and constipation.  Genitourinary: pos for urgency and frequency. neg for hematuria or flank pain  Skin: Negative for pallor or rash   Neurological: Negative for weakness, light-headedness, numbness and headaches.  Hematological: Negative for adenopathy. Does not bruise/bleed easily.  Psychiatric/Behavioral: Negative for dysphoric mood. The patient is not nervous/anxious.         Objective:   Physical Exam  Constitutional: She appears well-developed and well-nourished. No distress.  Frail appearing elderly female   HENT:  Head: Normocephalic and atraumatic.  Mouth/Throat: Oropharynx is clear and moist.  Eyes: Conjunctivae and EOM are normal. Pupils are equal, round, and reactive to light.  Neck: Normal range of motion. Neck supple. No JVD present. Carotid bruit is not present. No thyromegaly present.    Cardiovascular: Normal rate, regular rhythm, normal heart sounds and intact distal pulses.  Exam reveals no gallop.   Pulmonary/Chest: Effort normal and breath sounds normal. No respiratory distress. She has no wheezes. She has no rales.  No crackles  Abdominal: Soft. Bowel sounds are normal. She exhibits no distension, no abdominal bruit and no mass. There is tenderness. There is no rebound.  No cva tenderness  Mild suprapubic tenderness  Musculoskeletal: She exhibits no edema.  Lymphadenopathy:    She has no cervical adenopathy.  Neurological: She is alert. She has normal reflexes.  Skin: Skin is warm and dry. No rash noted.  Psychiatric: She has a normal mood and affect.          Assessment & Plan:   Problem List Items Addressed This Visit      Cardiovascular and Mediastinum   Hypotension - Primary    In pt with cardiac hx  At home-not here today - has been orthostatic in the more remote past  Told her to be liberal with salt  Change position slowly-pt and family aware/ fall precautions Will tx her uti as well Recommend f/u with cardiology Not currently on bp med        Genitourinary   Chronic interstitial cystitis (Chronic)    I think today pt has genuine uti-sent for cx Disc poss of being colonized as well  Refilled uribrel  Enc urology f/u      UTI (lower urinary tract infection)    Pos UA Pending culture Sent in bactrim  Has IC - hard to tell when she has UTI ordinarily  May also be colonized Will also f/u with urology      Relevant Medications   Meth-Hyo-M Bl-Na Phos-Ph Sal (URIBEL) 118 MG CAPS

## 2016-01-09 NOTE — Progress Notes (Signed)
Pre visit review using our clinic review tool, if applicable. No additional management support is needed unless otherwise documented below in the visit note. 

## 2016-01-09 NOTE — Patient Instructions (Addendum)
I think you have a uti because urinalysis is positive- there is still a chance it is form interstitial cystitis and and bladder is colonized with bacteria normally Drink your fluids Salt is ok since bp is running low  Change position very slowly  Take the bactrim as directed for urine infection  I will refill uribrel If blood pressure problems do not stop after treating uti then you need to get back to cardiology

## 2016-01-09 NOTE — Telephone Encounter (Signed)
Rx sent and pt's daughter notified

## 2016-01-11 NOTE — Assessment & Plan Note (Signed)
Pos UA Pending culture Sent in bactrim  Has IC - hard to tell when she has UTI ordinarily  May also be colonized Will also f/u with urology

## 2016-01-11 NOTE — Assessment & Plan Note (Signed)
In pt with cardiac hx  At home-not here today - has been orthostatic in the more remote past  Told her to be liberal with salt  Change position slowly-pt and family aware/ fall precautions Will tx her uti as well Recommend f/u with cardiology Not currently on bp med

## 2016-01-11 NOTE — Assessment & Plan Note (Signed)
I think today pt has genuine uti-sent for cx Disc poss of being colonized as well  Refilled uribrel  Enc urology f/u

## 2016-01-24 DIAGNOSIS — H401123 Primary open-angle glaucoma, left eye, severe stage: Secondary | ICD-10-CM | POA: Diagnosis not present

## 2016-01-24 DIAGNOSIS — H401113 Primary open-angle glaucoma, right eye, severe stage: Secondary | ICD-10-CM | POA: Diagnosis not present

## 2016-01-24 DIAGNOSIS — H43813 Vitreous degeneration, bilateral: Secondary | ICD-10-CM | POA: Diagnosis not present

## 2016-01-24 DIAGNOSIS — Z0100 Encounter for examination of eyes and vision without abnormal findings: Secondary | ICD-10-CM | POA: Diagnosis not present

## 2016-02-03 ENCOUNTER — Telehealth: Payer: Self-pay | Admitting: Family Medicine

## 2016-02-03 NOTE — Telephone Encounter (Signed)
Pt has appt with Dr Glori Bickers 02/06/16 at 9 AM.

## 2016-02-03 NOTE — Telephone Encounter (Signed)
[facility]     Patient Name: Ashlee Reyes Client [facility]    Client Site [place] [facility]    [facility] [place], [place] Type Call    Who Is Calling Patient / Member / Family / Caregiver    Call Type Triage / Clinical    Caller Name [name]    Relationship To Patient Daughter  Gender: Female Return Phone Number [phone] (Primary)  DOB: 12-14-1921  Chief Complaint BLOOD PRESSURE LOW - Systolic (top number) 90 or less with dizzy or weak symptoms  Age: 10 Y 6 M 3 D Reason for Call Symptomatic / Request for Health Information  Return Phone Number: [phone] (Primary), [phone] (Secondary) Initial Comment Caller states mother is feeling dizziness when she moves around and BP is (left arm) 72/57. Right arm 99/66  Address:  PreDisposition Go to ED  City/State/Zip: [employer]  Translation No    Nurse Assessment  Nurse: Amalia Hailey, RN, Melissa Date/Time (Eastern Time): 02/03/2016 4:36:57 PM  Confirm and document reason for call. If symptomatic, describe symptoms. You must click the next button to save text entered. ---Caller states mother is feeling dizziness when she moves around and BP is (left arm) 72/57. Right arm 99/66  Has the patient traveled out of the country within the last 30 days? ---Not Applicable  Does the patient have any new or worsening symptoms? ---Yes  Will a triage be completed? ---Yes  Related visit to physician within the last 2 weeks? ---No  Does the PT have any chronic conditions? (i.e. diabetes, asthma, etc.) ---Yes  List chronic conditions. ---Nexium-Reflux, Glaucoma, Plavix-generic for hx of stroke, Heart blockage, Asthma, interstitial cystitis, hx of frequent UTI's,  Is this a behavioral health or substance abuse call? ---No    Guidelines      Guideline Title Affirmed Question Affirmed Notes  Nurse Date/Time (Eastern Time)  Low Blood Pressure Brief (now gone) dizziness or lightheadedness after standing up or eating  Amalia Hailey, RN, Melissa 02/03/2016 4:41:01 PM  Disp. Time Eilene Ghazi Time) Disposition Final User   02/03/2016 4:31:51 PM Send to Urgent Queue  Honor Loh   02/03/2016 4:49:08 PM See PCP When Office is Open (within 3 days) Yes Amalia Hailey, RN, Lenna Sciara         Caller Understands: Yes   Disagree/Comply: Comply      Care Advice Given Per Guideline         SEE PCP WITHIN 3 DAYS: WHERE CAN YOU GO TO GET A BLOOD PRESSURE CHECK?: CALL BACK IF: * You become worse. * Systolic BP under 90 * Lightheadedness or dizziness worsens           Comments  User: Colin Ina, RN Date/Time (Eastern Time): 02/03/2016 4:51:05 PM  Caller reports BP on standing is 149/98.  Referrals   REFERRED TO PCP OFFICE

## 2016-02-03 NOTE — Telephone Encounter (Signed)
PLEASE NOTE: All timestamps contained within this report are represented as [place] Standard Time. CONFIDENTIALTY NOTICE: This fax transmission is intended only for the addressee. It contains information that is legally privileged, confidential or otherwise protected from use or disclosure. If you are not the intended recipient, you are strictly prohibited from reviewing, disclosing, copying using or disseminating any of this information or taking any action in reliance on or regarding this information. If you have received this fax in error, please notify us immediately by telephone so that we can arrange for its return to [place]. Phone: [phone], Toll-Free: [phone], Fax: [phone] Page: 1 of 2 Call Id: [id number] [place] Patient Name: Ashlee Reyes Gender: Female DOB: 09-Apr-1921 Age: 82 Y 51 M 3 D Return Phone Number: [phone] (Primary), [phone] (Secondary) Address: City/State/Zip: [place] [facility] - Client Client Site [facility], [place] Contact Type Call Who Is Calling Patient / Member / Family / Caregiver Call Type Triage / Clinical Caller Name [name] Relationship To Patient Daughter Return Phone Number [phone] (Primary) Chief Complaint BLOOD PRESSURE LOW - Systolic (top number) 90 or less with dizzy or weak symptoms Reason for Call Symptomatic / Request for Health Information Initial Comment Caller states mother is feeling dizziness when she moves around and BP is (left arm) 72/57. Right arm 99/66 Appointment Disposition EMR Appointment Scheduled Info pasted into Epic Yes PreDisposition Go to ED Translation No Nurse Assessment Nurse: Amalia Hailey, RN, Melissa Date/Time (Eastern Time): 02/03/2016 4:36:57 PM Confirm and document reason for call. If symptomatic, describe symptoms. You must  click the next button to save text entered. ---Caller states mother is feeling dizziness when she moves around and BP is (left arm) 72/57. Right arm 99/66 Has the patient traveled out of the country within the last 30 days? ---Not Applicable Does the patient have any new or worsening symptoms? ---Yes Will a triage be completed? ---Yes Related visit to physician within the last 2 weeks? ---No Does the PT have any chronic conditions? (i.e. diabetes, asthma, etc.) ---Yes List chronic conditions. ---Nexium-Reflux, Glaucoma, Plavix-generic for hx of stroke, Heart blockage, Asthma, interstitial cystitis, hx of frequent UTI's, Is this a behavioral health or substance abuse call? ---No Guidelines Guideline Title Affirmed Question Affirmed Notes Nurse Date/Time (Eastern Time) Low Blood Pressure Brief (now gone) dizziness or Evans, RN, Melissa 02/03/2016 4:41:01 PM PLEASE NOTE: All timestamps contained within this report are represented as [place] Standard Time. CONFIDENTIALTY NOTICE: This fax transmission is intended only for the addressee. It contains information that is legally privileged, confidential or otherwise protected from use or disclosure. If you are not the intended recipient, you are strictly prohibited from reviewing, disclosing, copying using or disseminating any of this information or taking any action in reliance on or regarding this information. If you have received this fax in error, please notify us immediately by telephone so that we can arrange for its return to [place]. Phone: [phone], Toll-Free: [phone], Fax: [phone] Page: 2 of 2 Call Id: [id number] Guidelines Guideline Title Affirmed Question Affirmed Notes Nurse Date/Time Eilene Ghazi Time) lightheadedness after standing up or eating Disp. Time Eilene Ghazi Time) Disposition Final User 02/03/2016 4:31:51 PM Send to Urgent Queue Honor Loh 02/03/2016 4:49:08 PM See PCP When Office is Open (within 3 days) Yes  Amalia Hailey, RN, Lenna Sciara Caller Understands: Yes Disagree/Comply: Comply Care Advice Given Per Guideline SEE PCP WITHIN 3 DAYS:  WHERE CAN YOU GO TO GET A BLOOD PRESSURE CHECK?: CALL BACK IF: * You become worse. * Systolic BP under 90 * Lightheadedness or dizziness worsens Comments User: Colin Ina, RN Date/Time (Eastern Time): 02/03/2016 4:51:05 PM Caller reports BP on standing is 149/98. Referrals REFERRED TO PCP OFFICE

## 2016-02-05 NOTE — Telephone Encounter (Signed)
I will see her then  

## 2016-02-06 ENCOUNTER — Ambulatory Visit (INDEPENDENT_AMBULATORY_CARE_PROVIDER_SITE_OTHER): Payer: Medicare Other | Admitting: Family Medicine

## 2016-02-06 ENCOUNTER — Encounter: Payer: Self-pay | Admitting: Family Medicine

## 2016-02-06 VITALS — BP 122/68 | HR 76 | Temp 97.4°F | Ht 66.5 in | Wt 161.5 lb

## 2016-02-06 DIAGNOSIS — N3010 Interstitial cystitis (chronic) without hematuria: Secondary | ICD-10-CM | POA: Diagnosis not present

## 2016-02-06 DIAGNOSIS — N390 Urinary tract infection, site not specified: Secondary | ICD-10-CM

## 2016-02-06 DIAGNOSIS — M48061 Spinal stenosis, lumbar region without neurogenic claudication: Secondary | ICD-10-CM

## 2016-02-06 DIAGNOSIS — R002 Palpitations: Secondary | ICD-10-CM | POA: Diagnosis not present

## 2016-02-06 DIAGNOSIS — E7800 Pure hypercholesterolemia, unspecified: Secondary | ICD-10-CM

## 2016-02-06 DIAGNOSIS — I959 Hypotension, unspecified: Secondary | ICD-10-CM

## 2016-02-06 DIAGNOSIS — R300 Dysuria: Secondary | ICD-10-CM | POA: Diagnosis not present

## 2016-02-06 DIAGNOSIS — M4806 Spinal stenosis, lumbar region: Secondary | ICD-10-CM

## 2016-02-06 LAB — COMPREHENSIVE METABOLIC PANEL
ALBUMIN: 3.7 g/dL (ref 3.5–5.2)
ALT: 8 U/L (ref 0–35)
AST: 13 U/L (ref 0–37)
Alkaline Phosphatase: 53 U/L (ref 39–117)
BUN: 18 mg/dL (ref 6–23)
CHLORIDE: 107 meq/L (ref 96–112)
CO2: 29 mEq/L (ref 19–32)
CREATININE: 0.74 mg/dL (ref 0.40–1.20)
Calcium: 9.3 mg/dL (ref 8.4–10.5)
GFR: 77.92 mL/min (ref >60.00)
Glucose, Bld: 91 mg/dL (ref 70–99)
Potassium: 4.4 mEq/L (ref 3.5–5.1)
SODIUM: 142 meq/L (ref 135–145)
Total Bilirubin: 0.4 mg/dL (ref 0.2–1.2)
Total Protein: 6.6 g/dL (ref 6.0–8.3)

## 2016-02-06 LAB — CBC WITH DIFFERENTIAL/PLATELET
BASOS PCT: 0.6 % (ref 0.0–3.0)
Basophils Absolute: 0 10*3/uL (ref 0.0–0.1)
EOS ABS: 0.1 10*3/uL (ref 0.0–0.7)
Eosinophils Relative: 0.8 % (ref 0.0–5.0)
HCT: 39.8 % (ref 36.0–46.0)
HEMOGLOBIN: 13.4 g/dL (ref 12.0–15.0)
Lymphocytes Relative: 26.1 % (ref 12.0–46.0)
Lymphs Abs: 1.8 10*3/uL (ref 0.7–4.0)
MCHC: 33.6 g/dL (ref 30.0–36.0)
MCV: 88.6 fl (ref 78.0–100.0)
MONO ABS: 0.6 10*3/uL (ref 0.1–1.0)
Monocytes Relative: 8.7 % (ref 3.0–12.0)
Neutro Abs: 4.5 10*3/uL (ref 1.4–7.7)
Neutrophils Relative %: 63.8 % (ref 43.0–77.0)
PLATELETS: 254 10*3/uL (ref 150.0–400.0)
RBC: 4.49 Mil/uL (ref 3.87–5.11)
RDW: 14.9 % (ref 11.5–15.5)
WBC: 7.1 10*3/uL (ref 4.0–10.5)

## 2016-02-06 LAB — POC URINALSYSI DIPSTICK (AUTOMATED)
Bilirubin, UA: NEGATIVE
GLUCOSE UA: NEGATIVE
Ketones, UA: NEGATIVE
Nitrite, UA: NEGATIVE
PH UA: 6
Protein, UA: NEGATIVE
SPEC GRAV UA: 1.01
UROBILINOGEN UA: 0.2

## 2016-02-06 LAB — LIPID PANEL
CHOL/HDL RATIO: 6
Cholesterol: 245 mg/dL — ABNORMAL HIGH (ref 0–200)
HDL: 43.4 mg/dL (ref >39.00)
NONHDL: 201.71
Triglycerides: 221 mg/dL — ABNORMAL HIGH (ref 0.0–149.0)
VLDL: 44.2 mg/dL — AB (ref 0.0–40.0)

## 2016-02-06 LAB — TSH: TSH: 3.49 u[IU]/mL (ref 0.35–4.50)

## 2016-02-06 LAB — LDL CHOLESTEROL, DIRECT: LDL DIRECT: 161 mg/dL

## 2016-02-06 NOTE — Assessment & Plan Note (Signed)
ua is borderline Some dysuria-hard to tell if uti or IC  Will ref her back to urology for periodic f/u of frequent uti  Rev last cx cx sent-pending result will tx if positive  Also ? If she could be colonized

## 2016-02-06 NOTE — Patient Instructions (Signed)
Continue a good fluid intake Also be liberal with salt and salty foods  I will sent your urine for a culture  Please stop at check out for referrals (cardiology and urology)

## 2016-02-06 NOTE — Assessment & Plan Note (Signed)
Due for labs  Diet controlled currently  Rev low sat fat diet  Intol of statins

## 2016-02-06 NOTE — Progress Notes (Signed)
Pre visit review using our clinic review tool, if applicable. No additional management support is needed unless otherwise documented below in the visit note. 

## 2016-02-06 NOTE — Assessment & Plan Note (Signed)
This has caused her to be mobility impaired  Signed handicapped form for parking

## 2016-02-06 NOTE — Progress Notes (Signed)
Subjective:    Patient ID: Ashlee Reyes, female    DOB: [birth year], 82 y.o.   MRN: [id number]  HPI Here for hypotension / symptomatic Not on any bp medications at this time    Also some burning on urination - having symptoms / burning on urination Frequency and urgency  Due for f/u with urology  Hx of utis Hx of IC Results for orders placed or performed in visit on 02/06/16  POCT Urinalysis Dipstick (Automated)  Result Value Ref Range   Color, UA Yellow    Clarity, UA Clear    Glucose, UA Neg.    Bilirubin, UA Neg.    Ketones, UA Neg.    Spec Grav, UA 1.010    Blood, UA 10 Ery/uL    pH, UA 6.0    Protein, UA Neg.    Urobilinogen, UA 0.2    Nitrite, UA Neg.    Leukocytes, UA small (1+) (A) Negative     Needs handicapped form filled out    bp at home gets as low as 90s/60s She gets dizzy when this happens- not always positional-happened once in the middle of the night  As high ast 140s/90s  Usually closer to one teens/70s -most of the time  Last visit to cardiology was 2/16- thought the orthostatic hypotension was due to recent GI illness   She eats regular meals  Wt is up 3 lb with bmi of 25 Family says she is eating well  Drinking a lot of fluids   Also a hx of irreg heart beat   She did address this with Dr Felecia Shelling (neuro) in the past - over a year ago , did initiate some home therapy for balance   BP Readings from Last 3 Encounters:  02/06/16 122/68  01/09/16 128/76  08/11/15 124/72    Due for labs for cholesterol   Patient Active Problem List   Diagnosis Date Noted  . Frequent UTI 02/06/2016  . Hypotension 01/09/2016  . Slurred speech 06/01/2015  . Numbness 06/01/2015  . Carotid stenosis 06/01/2015  . Local reaction to immunization 02/21/2015  . Encounter for Medicare annual wellness exam 02/15/2015  . Estrogen deficiency 02/15/2015  . Sinusitis, chronic 01/12/2015  . Orthostatic dizziness 01/12/2015  . IBS (irritable bowel syndrome)  01/12/2015  . Orthostatic hypotension 01/11/2015  . Atypical chest pain 01/11/2015  . Dizziness   . Pulsatile tinnitus of left ear 07/08/2014  . Palpitations 05/13/2014  . Diarrhea 12/23/2013  . Gastritis 10/28/2013  . Second degree heart block 10/18/2013  . Syncope 10/17/2013  . UTI (lower urinary tract infection) 10/17/2013  . Abdominal aneurysm without mention of rupture 06/25/2013  . Osteopenia 01/13/2013  . CAD (coronary artery disease)   . Chronic depression 06/03/2007  . GERD 06/02/2007  . Hyperlipidemia   . Asthma   . Irritable bowel syndrome   . Chronic interstitial cystitis   . Osteoarthritis   . Spinal stenosis of lumbar region   . History of urinary tract infection    Past Medical History  Diagnosis Date  . Osteoarthritis     spine/spinal stenosis  . GERD (gastroesophageal reflux disease)   . Hemorrhoids   . IC (irritable colon)     DMSO in past  . Allergic rhinitis, seasonal   . IBS (irritable bowel syndrome)   . Diverticulosis   . CAD (coronary artery disease) 07/2009    mod by cath 08/10  . AAA (abdominal aortic aneurysm) ([place])     followed  by Dr Oneida Alar. Last u/s07/10 stable AAA with largest measurement 3.97 x 4.02cm.  . Skin cancer     basal cell cancer - face.  . Osteopenia 2011  . Hyperlipidemia   . Depression   . Asthma   . Interstitial cystitis   . History of recurrent UTIs   . Disc disease, degenerative, lumbar or lumbosacral     SPINAL STENOSIS  . Atrial fibrillation ([place])   . Complication of anesthesia   . PONV (postoperative nausea and vomiting)    Past Surgical History  Procedure Laterality Date  . Right colectomy  1997    benign  . Abdominal hysterectomy      partial not cancer  . Cystocele repair    . Rectocele repair    . Hernia repair  [id number]    umbilical hernia  . Esophagogastroduodenoscopy  09/1998    erosive esphagitis ? stricture treated 12/2002  . Refractive surgery      for film after cataract surgery.  . Skin  lesion excision  05/2011    basal cell skin lesion removed.  . Breast surgery      lumpectomy  . Abdominal aortic endovascular stent graft N/A 07/01/2013    Procedure: ABDOMINAL AORTIC ENDOVASCULAR STENT GRAFT- GORE;  Surgeon: Elam Dutch, MD;  Location: [facility];  Service: Vascular;  Laterality: N/A;  Ultrasound guided   Social History  Substance Use Topics  . Smoking status: Never Smoker   . Smokeless tobacco: Never Used  . Alcohol Use: No   Family History  Problem Relation Age of Onset  . Heart disease Mother     deceased age 76 heart attack.  Marland Kitchen Heart attack Mother   . Other Mother     varicose veins  . Varicose Veins Mother   . Heart disease Father     age 74 heart attack  . Cancer Father   . Stroke Brother   . Cancer Brother   . Heart disease Brother   . Heart attack Brother   . Cancer Daughter   . Diabetes Son    Allergies  Allergen Reactions  . Amoxicillin-Pot Clavulanate     REACTION: sick  . Avelox [Moxifloxacin Hcl In Nacl] Nausea Only  . Ibuprofen     REACTION: GI  . Keflex [Cephalexin]     abd pain / GI upset  . Nitrofurantoin Other (See Comments)    Chest pain or indigestion with nausea  . Omeprazole Nausea Only  . Prevnar [Pneumococcal 13-Val Conj Vacc] Itching    Severe local reaction with redness and itching   . Rosuvastatin     REACTION: foot pain  . Sertraline Hcl     REACTION: more depressed  . Tetanus Toxoids     Local redness and swelling    Current Outpatient Prescriptions on File Prior to Visit  Medication Sig Dispense Refill  . acetaminophen (TYLENOL) 500 MG tablet Take 500 mg by mouth every 4 (four) hours as needed for mild pain or moderate pain. For pain    . albuterol (PROAIR HFA) 108 (90 BASE) MCG/ACT inhaler Inhale 2 puffs into the lungs every 4 (four) hours as needed for wheezing. 2 puffs up to every 4 hours as needed for wheezing (Patient taking differently: Inhale 1 puff into the lungs every 4 (four) hours as needed for wheezing.  2 puffs up to every 4 hours as needed for wheezing) 1 Inhaler 11  . beclomethasone (QVAR) 80 MCG/ACT inhaler Inhale 1 puff into the lungs 2 (  two) times daily. 1 Inhaler 11  . cetirizine (ZYRTEC) 10 MG tablet Take 10 mg by mouth daily as needed. For allergies    . clopidogrel (PLAVIX) 75 MG tablet Take 1 tablet (75 mg total) by mouth daily. 30 tablet 5  . esomeprazole (NEXIUM) 40 MG capsule Take 1 capsule (40 mg total) by mouth daily. 30 capsule 11  . fluticasone (FLONASE) 50 MCG/ACT nasal spray Place 2 sprays into both nostrils daily. 16 g 0  . latanoprost (XALATAN) 0.005 % ophthalmic solution Place 1 drop into both eyes at bedtime.      . Meth-Hyo-M Bl-Na Phos-Ph Sal (URIBEL) 118 MG CAPS Take 1 capsule (118 mg total) by mouth 3 (three) times daily as needed (urination frequency). 90 capsule 5  . Polyethyl Glycol-Propyl Glycol (SYSTANE OP) Place 1 drop into both eyes as needed (dry eyes).     . polyethylene glycol (MIRALAX / GLYCOLAX) packet Take 17 g by mouth daily as needed. For constipation    . Probiotic Product (PROBIOTIC DAILY PO) Take 1 capsule by mouth.    . ranitidine (ZANTAC) 300 MG tablet Take 1 tablet (300 mg total) by mouth at bedtime. 30 tablet 2   No current facility-administered medications on file prior to visit.     Review of Systems Review of Systems  Constitutional: Negative for fever, appetite change, fatigue and unexpected weight change.  Eyes: Negative for pain and visual disturbance.  Respiratory: Negative for cough and shortness of breath.   Cardiovascular: Negative for cp or palpitations   pos for hypotension  Gastrointestinal: Negative for nausea, diarrhea and constipation.  Genitourinary: pos for urgency and frequency. neg for hematuria  MSK pos for chronic back pain causing difficulty walking  Skin: Negative for pallor or rash   Neurological: Negative for weakness, , numbness and headaches. pos for lightheadedness  Hematological: Negative for adenopathy. Does  not bruise/bleed easily.  Psychiatric/Behavioral: Negative for dysphoric mood. The patient is occ  nervous/anxious.         Objective:   Physical Exam  Constitutional: She appears well-developed and well-nourished. No distress.  Well appearing   HENT:  Head: Normocephalic and atraumatic.  Mouth/Throat: Oropharynx is clear and moist.  Eyes: Conjunctivae and EOM are normal. Pupils are equal, round, and reactive to light.  Neck: Normal range of motion. Neck supple. No JVD present. Carotid bruit is not present. No thyromegaly present.  Cardiovascular: Normal rate, regular rhythm, normal heart sounds and intact distal pulses.  Exam reveals no gallop.   Pulmonary/Chest: Effort normal and breath sounds normal. No respiratory distress. She has no wheezes. She has no rales.  No crackles  Abdominal: Soft. Bowel sounds are normal. She exhibits no distension, no abdominal bruit and no mass. There is tenderness. There is no rebound.  No cva tenderness  Mild suprapubic tenderness  Musculoskeletal: She exhibits no edema.  Poor rom of LS   Lymphadenopathy:    She has no cervical adenopathy.  Neurological: She is alert. She has normal reflexes.  Skin: Skin is warm and dry. No rash noted.  Psychiatric: She has a normal mood and affect.          Assessment & Plan:   Problem List Items Addressed This Visit      Cardiovascular and Mediastinum   Hypotension - Primary    This is ongoing with orthostatic symptoms but no recent syncope Unsure of cause  Enc her to take in more fluids/ be liberal with salt Also change position slowly  Ref to cardiology for this and palpitations  Will continue to chart her bp at home       Relevant Orders   Ambulatory referral to Cardiology   CBC with Differential/Platelet (Completed)   Comprehensive metabolic panel (Completed)   TSH (Completed)     Genitourinary   Chronic interstitial cystitis (Chronic)    Dysuria with borderline ua  Ref back to urol  for f/u U cx pending       Relevant Orders   Ambulatory referral to Urology   Frequent UTI    ua is borderline Some dysuria-hard to tell if uti or IC  Will ref her back to urology for periodic f/u of frequent uti  Rev last cx cx sent-pending result will tx if positive  Also ? If she could be colonized       Relevant Orders   Ambulatory referral to Urology   Urine culture     Other   Hyperlipidemia (Chronic)    Due for labs  Diet controlled currently  Rev low sat fat diet  Intol of statins       Relevant Orders   Comprehensive metabolic panel (Completed)   Lipid panel (Completed)   Palpitations   Relevant Orders   TSH (Completed)   Spinal stenosis of lumbar region (Chronic)    This has caused her to be mobility impaired  Signed handicapped form for parking       Other Visit Diagnoses    Dysuria        Relevant Orders    POCT Urinalysis Dipstick (Automated) (Completed)

## 2016-02-06 NOTE — Assessment & Plan Note (Signed)
This is ongoing with orthostatic symptoms but no recent syncope Unsure of cause  Enc her to take in more fluids/ be liberal with salt Also change position slowly  Ref to cardiology for this and palpitations  Will continue to chart her bp at home

## 2016-02-06 NOTE — Assessment & Plan Note (Signed)
Dysuria with borderline ua  Ref back to urol for f/u U cx pending

## 2016-02-08 LAB — URINE CULTURE: Colony Count: >=100000

## 2016-02-09 ENCOUNTER — Telehealth: Payer: Self-pay | Admitting: Family Medicine

## 2016-02-09 DIAGNOSIS — M4806 Spinal stenosis, lumbar region: Secondary | ICD-10-CM | POA: Diagnosis not present

## 2016-02-09 DIAGNOSIS — M1712 Unilateral primary osteoarthritis, left knee: Secondary | ICD-10-CM | POA: Diagnosis not present

## 2016-02-09 MED ORDER — SULFAMETHOXAZOLE-TRIMETHOPRIM 800-160 MG PO TABS: 1.0000 | ORAL_TABLET | Freq: Two times a day (BID) | ORAL | Status: DC

## 2016-02-09 NOTE — Telephone Encounter (Signed)
Positive urine culture  Will tx with bactrim - it is sensitive to that -please call it in  Copy to her urologist also please

## 2016-02-09 NOTE — Telephone Encounter (Signed)
Daughter notified Rx sent to pharmacy and copy of urine cx sent to urologist

## 2016-02-14 ENCOUNTER — Telehealth: Payer: Self-pay | Admitting: Family Medicine

## 2016-02-14 NOTE — Telephone Encounter (Signed)
Labs were stable but cholesterol was up  Avoid red meat/ fried foods/ egg yolks/ fatty breakfast meats/ butter, cheese and high fat dairy/ and shellfish  - do the best you can with diet

## 2016-02-14 NOTE — Telephone Encounter (Signed)
Ashlee Reyes called and said she received the results of patient's urine culture, but didn't get the results of patient's lab work.  Ashlee Reyes said you can leave a detailed message on her cell phone or home number, if she's not available.

## 2016-02-14 NOTE — Telephone Encounter (Signed)
Pt's daughter notified of pt's labs and Dr. Marliss Coots comments and verbalized understanding

## 2016-02-23 ENCOUNTER — Ambulatory Visit (INDEPENDENT_AMBULATORY_CARE_PROVIDER_SITE_OTHER): Payer: Medicare Other | Admitting: Cardiovascular Disease

## 2016-02-23 ENCOUNTER — Encounter: Payer: Self-pay | Admitting: Cardiovascular Disease

## 2016-02-23 VITALS — BP 138/72 | HR 80 | Ht 66.5 in | Wt 163.8 lb

## 2016-02-23 DIAGNOSIS — I959 Hypotension, unspecified: Secondary | ICD-10-CM

## 2016-02-23 NOTE — Patient Instructions (Signed)
Medication Instructions:  None  Labwork: None  Testing/Procedures: None  Follow-Up: Your physician recommends that you schedule a follow-up appointment as needed with Dr. Nahser.    Any Other Special Instructions Will Be Listed Below (If Applicable).     If you need a refill on your cardiac medications before your next appointment, please call your pharmacy.   

## 2016-02-23 NOTE — Progress Notes (Signed)
Cardiology Office Note   Date:  02/23/2016   ID:  Ashlee Reyes, DOB 1921-11-23, MRN [id number]  PCP:  Loura Pardon, MD  Cardiologist:   Acie Fredrickson Wonda Cheng, MD   Chief Complaint  Patient presents with  . Follow-up   Problem List: 1. Syncope 2. AAA - s/p stent graft repair - July 2014. followed by Ashlee Reyes  History of Present Illness:  Ashlee Reyes is will having episodes of weakness. She has had some episodes of CP. She had a stent graft repair of her AAA this past July and has never really improved since that time.    Feb. 9, 2016:  History of Present Illness: Ashlee Reyes is a 82 y.o. female who presents for follow up of her palpitations.  She was seen several months ago with palpitations and a 30 day monitor was placed.  The monitor revealed NSR with PVCs.   Ashlee Reyes is seen for some  Pains.  The pain started in her abdomen and radiated up to her chest and down  Both  Arms ( left > right). Better , but stomach still does not feel great. No cardiac issues   Echo showed:  - Left ventricle: The cavity size was normal. Wall thickness was increased in a pattern of mild LVH. Systolic function was normal. The estimated ejection fraction was in the range of 60% to 65%. Wall motion was normal; there were no regional wall motion abnormalities. Doppler parameters are consistent with abnormal left ventricular relaxation (grade 1 diastolic dysfunction). Doppler parameters are consistent with high ventricular filling pressure. - Aortic valve: There was trivial regurgitation. - Aortic root: The aortic root was mildly dilated. - Mitral valve: Calcified annulus. Mildly thickened leaflets . - Left atrium: The atrium was mildly dilated.  February 23, 2016:  Still has some dizziness.   Was seen for orthostatic hypotension  Several months  We encouraged her to eat and drink regularly . As she thinks back, she had a UTI with both episodes of  hypotension previously .  Complains of pain under her left breast .   Comes and goes. Not associated with exertion. Wakes up with it occasionally .    Seems to feel better if she turns over    Past Medical History  Diagnosis Date  . Osteoarthritis     spine/spinal stenosis  . GERD (gastroesophageal reflux disease)   . Hemorrhoids   . IC (irritable colon)     DMSO in past  . Allergic rhinitis, seasonal   . IBS (irritable bowel syndrome)   . Diverticulosis   . CAD (coronary artery disease) 07/2009    mod by cath 08/10  . AAA (abdominal aortic aneurysm) ([place])     followed by Dr Oneida Reyes. Last u/s07/10 stable AAA with largest measurement 3.97 x 4.02cm.  . Skin cancer     basal cell cancer - face.  . Osteopenia 2011  . Hyperlipidemia   . Depression   . Asthma   . Interstitial cystitis   . History of recurrent UTIs   . Disc disease, degenerative, lumbar or lumbosacral     SPINAL STENOSIS  . Atrial fibrillation ([place])   . Complication of anesthesia   . PONV (postoperative nausea and vomiting)     Past Surgical History  Procedure Laterality Date  . Right colectomy  1997    benign  . Abdominal hysterectomy      partial not cancer  . Cystocele repair    .  Rectocele repair    . Hernia repair  [id number]    umbilical hernia  . Esophagogastroduodenoscopy  09/1998    erosive esphagitis ? stricture treated 12/2002  . Refractive surgery      for film after cataract surgery.  . Skin lesion excision  05/2011    basal cell skin lesion removed.  . Breast surgery      lumpectomy  . Abdominal aortic endovascular stent graft N/A 07/01/2013    Procedure: ABDOMINAL AORTIC ENDOVASCULAR STENT GRAFT- GORE;  Surgeon: Elam Dutch, MD;  Location: [facility];  Service: Vascular;  Laterality: N/A;  Ultrasound guided     Current Outpatient Prescriptions  Medication Sig Dispense Refill  . acetaminophen (TYLENOL) 500 MG tablet Take 500 mg by mouth every 4 (four) hours as needed for mild pain or  moderate pain. For pain    . albuterol (PROAIR HFA) 108 (90 BASE) MCG/ACT inhaler Inhale 2 puffs into the lungs every 4 (four) hours as needed for wheezing. 2 puffs up to every 4 hours as needed for wheezing (Patient taking differently: Inhale 1 puff into the lungs every 4 (four) hours as needed for wheezing. 2 puffs up to every 4 hours as needed for wheezing) 1 Inhaler 11  . beclomethasone (QVAR) 80 MCG/ACT inhaler Inhale 1 puff into the lungs 2 (two) times daily. 1 Inhaler 11  . cetirizine (ZYRTEC) 10 MG tablet Take 10 mg by mouth daily as needed. For allergies    . clopidogrel (PLAVIX) 75 MG tablet Take 1 tablet (75 mg total) by mouth daily. 30 tablet 5  . esomeprazole (NEXIUM) 40 MG capsule Take 1 capsule (40 mg total) by mouth daily. 30 capsule 11  . fluticasone (FLONASE) 50 MCG/ACT nasal spray Place 2 sprays into both nostrils daily. 16 g 0  . latanoprost (XALATAN) 0.005 % ophthalmic solution Place 1 drop into both eyes at bedtime.      . Meth-Hyo-M Bl-Na Phos-Ph Sal (URIBEL) 118 MG CAPS Take 1 capsule (118 mg total) by mouth 3 (three) times daily as needed (urination frequency). 90 capsule 5  . Polyethyl Glycol-Propyl Glycol (SYSTANE OP) Place 1 drop into both eyes as needed (dry eyes).     . polyethylene glycol (MIRALAX / GLYCOLAX) packet Take 17 g by mouth daily as needed. For constipation    . Probiotic Product (PROBIOTIC DAILY PO) Take 1 capsule by mouth.    . ranitidine (ZANTAC) 300 MG tablet Take 1 tablet (300 mg total) by mouth at bedtime. 30 tablet 2   No current facility-administered medications for this visit.    Allergies:   Amoxicillin-pot clavulanate; Avelox; Ibuprofen; Keflex; Nitrofurantoin; Omeprazole; Prevnar; Rosuvastatin; Sertraline hcl; and Tetanus toxoids    Social History:  The patient  reports that she has never smoked. She has never used smokeless tobacco. She reports that she does not drink alcohol or use illicit drugs.   Family History:  The patient's family  history includes Cancer in her brother, daughter, and father; Diabetes in her son; Heart attack in her brother and mother; Heart disease in her brother, father, and mother; Other in her mother; Stroke in her brother; Varicose Veins in her mother.    ROS:  Please see the history of present illness.    Review of Systems: Constitutional:  denies fever, chills, diaphoresis, appetite change and fatigue.  HEENT: denies photophobia, eye pain, redness, hearing loss, ear pain, congestion, sore throat, rhinorrhea, sneezing, neck pain, neck stiffness and tinnitus.  Respiratory: denies SOB, DOE, cough, chest  tightness, and wheezing.  Cardiovascular: denies chest pain, palpitations and leg swelling.  Gastrointestinal: denies nausea, vomiting, abdominal pain, diarrhea, constipation, blood in stool.  Genitourinary: denies dysuria, urgency, frequency, hematuria, flank pain and difficulty urinating.  Musculoskeletal: denies  myalgias, back pain, joint swelling, arthralgias and gait problem.   Skin: denies pallor, rash and wound.  Neurological: denies dizziness, seizures, syncope, weakness, light-headedness, numbness and headaches.   Hematological: denies adenopathy, easy bruising, personal or family bleeding history.  Psychiatric/ Behavioral: denies suicidal ideation, mood changes, confusion, nervousness, sleep disturbance and agitation.       All other systems are reviewed and negative.    PHYSICAL EXAM: VS:  BP 138/72 mmHg  Pulse 80  Ht 5' 6.5" (1.689 m)  Wt 163 lb 12.8 oz (74.299 kg)  BMI 26.04 kg/m2  LMP 12/17/1968 , BMI Body mass index is 26.04 kg/(m^2). GEN: Well nourished, well developed, in no acute distress HEENT: normal Neck: no JVD, carotid bruits, or masses Cardiac: RRR; no murmurs, rubs, or gallops,no edema  Respiratory:  clear to auscultation bilaterally, normal work of breathing GI: soft, nontender, nondistended, + BS MS: no deformity or atrophy Skin: warm and dry, no  rash Neuro:  Strength and sensation are intact Psych: normal   EKG:  EKG is not ordered today.    Recent Labs: 02/06/2016: ALT 8; BUN 18; Creatinine, Ser 0.74; Hemoglobin 13.4; Platelets 254.0; Potassium 4.4; Sodium 142; TSH 3.49    Lipid Panel    Component Value Date/Time   CHOL 245* 02/06/2016 1023   TRIG 221.0* 02/06/2016 1023   HDL 43.40 02/06/2016 1023   CHOLHDL 6 02/06/2016 1023   VLDL 44.2* 02/06/2016 1023   LDLCALC 179* 02/08/2015 0820   LDLDIRECT 161.0 02/06/2016 1023      Wt Readings from Last 3 Encounters:  02/23/16 163 lb 12.8 oz (74.299 kg)  02/06/16 161 lb 8 oz (73.256 kg)  01/09/16 158 lb (71.668 kg)      Other studies Reviewed: Additional studies/ records that were reviewed today include: . Review of the above records demonstrates:    ASSESSMENT AND PLAN:  Problem List: 1. Syncope - she's not had any recent episodes of syncope. She has had some dizziness that sounds more like with orthostatic hypertension. 2. AAA - s/p stent graft repair - July 2014. followed by Ashlee Reyes  3. Orthostatic hypotension:  - She presents with an episode of orthostatic hypotension. I suspect that this was due to a GI illness. Encouraged her to   eat and drink a regular basis.  4. Premature ventricular contractions-- she was found have premature ventricular contractions on her 30 day event monitor. Reassured her that these are benign. She has normal left ventricle systolic function. She does not need any further workup  This point she seems to be very stable. I'll see her on an as-needed basis.  Current medicines are reviewed at length with the patient today.  The patient does not have concerns regarding medicines.  The following changes have been made:  no change   Disposition:   FU with her primary medical doctor     Signed, Nahser, Wonda Cheng, MD  02/23/2016 4:28 PM    [address], [place], [place]  [address] Phone: [phone]; Fax: [phone]

## 2016-02-27 DIAGNOSIS — Z0000 Encounter for general adult medical examination without abnormal findings: Secondary | ICD-10-CM | POA: Diagnosis not present

## 2016-02-27 DIAGNOSIS — N3020 Other chronic cystitis without hematuria: Secondary | ICD-10-CM | POA: Diagnosis not present

## 2016-02-27 DIAGNOSIS — N3010 Interstitial cystitis (chronic) without hematuria: Secondary | ICD-10-CM | POA: Diagnosis not present

## 2016-02-27 DIAGNOSIS — R350 Frequency of micturition: Secondary | ICD-10-CM | POA: Diagnosis not present

## 2016-03-05 ENCOUNTER — Ambulatory Visit (INDEPENDENT_AMBULATORY_CARE_PROVIDER_SITE_OTHER): Payer: Medicare Other

## 2016-03-05 VITALS — BP 120/70 | HR 87 | Temp 97.8°F | Ht 67.0 in | Wt 165.5 lb

## 2016-03-05 DIAGNOSIS — Z0000 Encounter for general adult medical examination without abnormal findings: Secondary | ICD-10-CM

## 2016-03-05 DIAGNOSIS — Z23 Encounter for immunization: Secondary | ICD-10-CM

## 2016-03-05 NOTE — Progress Notes (Signed)
   Subjective:    Patient ID: Ashlee Reyes, female    DOB: 07/07/1921, 82 y.o.   MRN: [id number]  HPI    Review of Systems     Objective:   Physical Exam        Assessment & Plan:  I reviewed health advisor's note, was available for consultation, and agree with documentation and plan.

## 2016-03-05 NOTE — Progress Notes (Signed)
Pre visit review using our clinic review tool, if applicable. No additional management support is needed unless otherwise documented below in the visit note. 

## 2016-03-05 NOTE — Patient Instructions (Addendum)
Ashlee Reyes , Thank you for taking time to come for your Medicare Wellness Visit. I appreciate your ongoing commitment to your health goals. Please review the following plan we discussed and let me know if I can assist you in the future.   These are the goals we discussed: Goals    When weather permits, I will exercise for an additional 10 min daily.       This is a list of the screening recommended for you and due dates:  Health Maintenance  Topic Date Due  . Pneumonia vaccines (2 of 2 - PPSV23) Declined - allergic reaction  . Mammogram  03/05/2017*  . Shingles Vaccine  03/05/2017*  . Flu Shot  07/17/2016  . Tetanus Vaccine  07/29/2022  . DEXA scan (bone density measurement)  Completed  *Topic was postponed. The date shown is not the original due date.   Preventive Care for Adults  A healthy lifestyle and preventive care can promote health and wellness. Preventive health guidelines for adults include the following key practices.  . A routine yearly physical is a good way to check with your health care provider about your health and preventive screening. It is a chance to share any concerns and updates on your health and to receive a thorough exam.  . Visit your dentist for a routine exam and preventive care every 6 months. Brush your teeth twice a day and floss once a day. Good oral hygiene prevents tooth decay and gum disease.  . The frequency of eye exams is based on your age, health, family medical history, use  of contact lenses, and other factors. Follow your health care provider's ecommendations for frequency of eye exams.  . Eat a healthy diet. Foods like vegetables, fruits, whole grains, low-fat dairy products, and lean protein foods contain the nutrients you need without too many calories. Decrease your intake of foods high in solid fats, added sugars, and salt. Eat the right amount of calories for you. Get information about a proper diet from your health care provider, if  necessary.  . Regular physical exercise is one of the most important things you can do for your health. Most adults should get at least 150 minutes of moderate-intensity exercise (any activity that increases your heart rate and causes you to sweat) each week. In addition, most adults need muscle-strengthening exercises on 2 or more days a week.  Silver Sneakers may be a benefit available to you. To determine eligibility, you may visit the website: www.silversneakers.com or contact program at 302-773-7873 Mon-Fri between 8AM-8PM.   . Maintain a healthy weight. The body mass index (BMI) is a screening tool to identify possible weight problems. It provides an estimate of body fat based on height and weight. Your health care provider can find your BMI and can help you achieve or maintain a healthy weight.   For adults 20 years and older: ? A BMI below 18.5 is considered underweight. ? A BMI of 18.5 to 24.9 is normal. ? A BMI of 25 to 29.9 is considered overweight. ? A BMI of 30 and above is considered obese.   . Maintain normal blood lipids and cholesterol levels by exercising and minimizing your intake of saturated fat. Eat a balanced diet with plenty of fruit and vegetables. Blood tests for lipids and cholesterol should begin at age 62 and be repeated every 5 years. If your lipid or cholesterol levels are high, you are over 50, or you are at high risk for heart  disease, you may need your cholesterol levels checked more frequently. Ongoing high lipid and cholesterol levels should be treated with medicines if diet and exercise are not working.  . If you smoke, find out from your health care provider how to quit. If you do not use tobacco, please do not start.  . If you choose to drink alcohol, please do not consume more than 2 drinks per day. One drink is considered to be 12 ounces (355 mL) of beer, 5 ounces (148 mL) of wine, or 1.5 ounces (44 mL) of liquor.  . If you are 70-36 years old, ask your  health care provider if you should take aspirin to prevent strokes.  . Use sunscreen. Apply sunscreen liberally and repeatedly throughout the day. You should seek shade when your shadow is shorter than you. Protect yourself by wearing long sleeves, pants, a wide-brimmed hat, and sunglasses year round, whenever you are outdoors.  . Once a month, do a whole body skin exam, using a mirror to look at the skin on your back. Tell your health care provider of new moles, moles that have irregular borders, moles that are larger than a pencil eraser, or moles that have changed in shape or color.  **Please review information on shingles vaccination.

## 2016-03-05 NOTE — Progress Notes (Signed)
Subjective:   Ashlee Reyes is a 82 y.o. female who presents for Medicare Annual (Subsequent) preventive examination.   Cardiac Risk Factors include: advanced age (>33men, >39 women);dyslipidemia     Objective:     Vitals: BP 120/70 mmHg  Pulse 87  Temp(Src) 97.8 F (36.6 C) (Oral)  Ht 5\' 7"  (1.702 m)  Wt 165 lb 8 oz (75.07 kg)  BMI 25.91 kg/m2  SpO2 97%  LMP 12/17/1968  Body mass index is 25.91 kg/(m^2).   Tobacco History  Smoking status  . Never Smoker   Smokeless tobacco  . Never Used     Counseling given: No   Past Medical History  Diagnosis Date  . Osteoarthritis     spine/spinal stenosis  . GERD (gastroesophageal reflux disease)   . Hemorrhoids   . IC (irritable colon)     DMSO in past  . Allergic rhinitis, seasonal   . IBS (irritable bowel syndrome)   . Diverticulosis   . CAD (coronary artery disease) 07/2009    mod by cath 08/10  . AAA (abdominal aortic aneurysm) ([place])     followed by Dr Oneida Alar. Last u/s07/10 stable AAA with largest measurement 3.97 x 4.02cm.  . Skin cancer     basal cell cancer - face.  . Osteopenia 2011  . Hyperlipidemia   . Depression   . Asthma   . Interstitial cystitis   . History of recurrent UTIs   . Disc disease, degenerative, lumbar or lumbosacral     SPINAL STENOSIS  . Atrial fibrillation ([place])   . Complication of anesthesia   . PONV (postoperative nausea and vomiting)    Past Surgical History  Procedure Laterality Date  . Right colectomy  1997    benign  . Abdominal hysterectomy      partial not cancer  . Cystocele repair    . Rectocele repair    . Hernia repair  [id number]    umbilical hernia  . Esophagogastroduodenoscopy  09/1998    erosive esphagitis ? stricture treated 12/2002  . Refractive surgery      for film after cataract surgery.  . Skin lesion excision  05/2011    basal cell skin lesion removed.  . Breast surgery      lumpectomy  . Abdominal aortic endovascular stent graft N/A  07/01/2013    Procedure: ABDOMINAL AORTIC ENDOVASCULAR STENT GRAFT- GORE;  Surgeon: Elam Dutch, MD;  Location: [facility];  Service: Vascular;  Laterality: N/A;  Ultrasound guided   Family History  Problem Relation Age of Onset  . Heart disease Mother     deceased age 58 heart attack.  Marland Kitchen Heart attack Mother   . Other Mother     varicose veins  . Varicose Veins Mother   . Heart disease Father     age 20 heart attack  . Cancer Father   . Stroke Brother   . Cancer Brother   . Heart disease Brother   . Heart attack Brother   . Cancer Daughter   . Diabetes Son    History  Sexual Activity  . Sexual Activity: No    Outpatient Encounter Prescriptions as of 03/05/2016  Medication Sig  . acetaminophen (TYLENOL) 500 MG tablet Take 500 mg by mouth every 4 (four) hours as needed for mild pain or moderate pain. For pain  . albuterol (PROAIR HFA) 108 (90 BASE) MCG/ACT inhaler Inhale 2 puffs into the lungs every 4 (four) hours as needed for wheezing. 2 puffs  up to every 4 hours as needed for wheezing (Patient taking differently: Inhale 1 puff into the lungs every 4 (four) hours as needed for wheezing. 2 puffs up to every 4 hours as needed for wheezing)  . beclomethasone (QVAR) 80 MCG/ACT inhaler Inhale 1 puff into the lungs 2 (two) times daily.  . cetirizine (ZYRTEC) 10 MG tablet Take 10 mg by mouth daily as needed. For allergies  . clopidogrel (PLAVIX) 75 MG tablet Take 1 tablet (75 mg total) by mouth daily.  Marland Kitchen esomeprazole (NEXIUM) 40 MG capsule Take 1 capsule (40 mg total) by mouth daily.  . fluticasone (FLONASE) 50 MCG/ACT nasal spray Place 2 sprays into both nostrils daily.  Marland Kitchen latanoprost (XALATAN) 0.005 % ophthalmic solution Place 1 drop into both eyes at bedtime.    . Meth-Hyo-M Bl-Na Phos-Ph Sal (URIBEL) 118 MG CAPS Take 1 capsule (118 mg total) by mouth 3 (three) times daily as needed (urination frequency).  [name] Glycol-Propyl Glycol (SYSTANE OP) Place 1 drop into both eyes as  needed (dry eyes).   . polyethylene glycol (MIRALAX / GLYCOLAX) packet Take 17 g by mouth daily as needed. For constipation  . Probiotic Product (PROBIOTIC DAILY PO) Take 1 capsule by mouth.  . ranitidine (ZANTAC) 300 MG tablet Take 1 tablet (300 mg total) by mouth at bedtime.   No facility-administered encounter medications on file as of 03/05/2016.    Activities of Daily Living In your present state of health, do you have any difficulty performing the following activities: 03/05/2016  Hearing? Y  Vision? N  Difficulty concentrating or making decisions? Y  Walking or climbing stairs? N  Dressing or bathing? N  Preparing Food and eating ? N  Using the Toilet? N  In the past six months, have you accidently leaked urine? Y  Do you have problems with loss of bowel control? N  Managing your Medications? N  Managing your Finances? N  Housekeeping or managing your Housekeeping? N    Patient Care Team: Abner Greenspan, MD as PCP - General  Provider list updated.    Assessment:     Hearing Screening Comments: Wears bilateral hearing aids Vision Screening Comments: Last eye exam in 01/24/2016   Exercise Activities and Dietary recommendations Current Exercise Habits: Home exercise routine, Type of exercise: stretching, Time (Minutes): 10, Frequency (Times/Week): 7, Weekly Exercise (Minutes/Week): 70, Intensity: Mild  Goals    . Increase physical activity     When weather permits, I will exercise for an additional 10 min daily.      Fall Risk Fall Risk  03/05/2016 02/15/2015 01/13/2013  Falls in the past year? No No No   Depression Screen PHQ 2/9 Scores 03/05/2016 02/15/2015 01/13/2013  PHQ - 2 Score 0 0 0     Cognitive Testing MMSE - Mini Mental State Exam 03/05/2016  Orientation to time 5  Orientation to Place 5  Registration 3  Attention/ Calculation 5  Recall 3  Language- name 2 objects 0  Language- repeat 0  Language- follow 3 step command 3  Language- read & follow direction  1  Write a sentence 0  Copy design 0  Total score 25    Immunization History  Administered Date(s) Administered  . Influenza Split 09/17/2012  . Influenza Whole 10/02/2005, 09/18/2007, 09/14/2008, 09/14/2009, 12/13/2010  . Influenza,inj,Quad PF,36+ Mos 09/30/2013, 02/15/2015, 03/05/2016  . Pneumococcal Conjugate-13 02/15/2015  . Td 07/29/2012   Screening Tests Health Maintenance  Topic Date Due  . MAMMOGRAM  02/14/2021 (Originally  10/23/2003)  . ZOSTAVAX  02/14/2021 (Originally 07/31/1983)  . PNA vac Low Risk Adult (2 of 2 - PPSV23) 02/14/2021 (Originally 02/15/2016)  . INFLUENZA VACCINE  07/17/2016  . TETANUS/TDAP  07/29/2022  . DEXA SCAN  Completed      Plan:     I have personally reviewed the Medicare Annual Wellness questionnaire and have noted the following in the patient's chart:  A. Medical and social history B. Use of alcohol, tobacco or illicit drugs  C. Current medications and supplements D. Functional ability and status E.  Nutritional status F.  Physical activity G. Advance directives H. List of other physicians I.  Hospitalizations, surgeries, and ER visits in previous 12 months J.  [place] to include hearing, vision, cognitive, depression L. Referrals and appointments - none  In addition, I reviewed preventive protocols, quality metrics, and best practice recommendations specific to patient. A written personalized care plan for preventive services as well as general preventive health recommendations were provided to patient. Patient was given educational literature on shingles and flu vaccines.   See attached scanned questionnaire for additional information.   Signed,   Lindell Noe, MHA, BS, LPN Health Advisor [phone]

## 2016-03-08 DIAGNOSIS — Z0000 Encounter for general adult medical examination without abnormal findings: Secondary | ICD-10-CM | POA: Diagnosis not present

## 2016-03-08 DIAGNOSIS — N3020 Other chronic cystitis without hematuria: Secondary | ICD-10-CM | POA: Diagnosis not present

## 2016-03-08 DIAGNOSIS — N1330 Unspecified hydronephrosis: Secondary | ICD-10-CM | POA: Diagnosis not present

## 2016-04-03 DIAGNOSIS — H401113 Primary open-angle glaucoma, right eye, severe stage: Secondary | ICD-10-CM | POA: Diagnosis not present

## 2016-04-03 DIAGNOSIS — H401123 Primary open-angle glaucoma, left eye, severe stage: Secondary | ICD-10-CM | POA: Diagnosis not present

## 2016-04-16 DIAGNOSIS — R3129 Other microscopic hematuria: Secondary | ICD-10-CM | POA: Diagnosis not present

## 2016-04-16 DIAGNOSIS — N1330 Unspecified hydronephrosis: Secondary | ICD-10-CM | POA: Diagnosis not present

## 2016-04-16 DIAGNOSIS — Z0000 Encounter for general adult medical examination without abnormal findings: Secondary | ICD-10-CM | POA: Diagnosis not present

## 2016-04-20 ENCOUNTER — Other Ambulatory Visit: Payer: Self-pay

## 2016-04-20 MED ORDER — ALBUTEROL SULFATE HFA 108 (90 BASE) MCG/ACT IN AERS: 2.0000 | INHALATION_SPRAY | RESPIRATORY_TRACT | Status: DC | PRN

## 2016-04-20 NOTE — Telephone Encounter (Signed)
Done and Megan Salon. notified

## 2016-04-20 NOTE — Telephone Encounter (Signed)
Ashlee Reyes (DPR signed) left v/m requesting refill for proair; last refilled # 1 x 11 on 04/16/11. Last seen 02/06/16. Please advise. Linda request cb. [place].

## 2016-04-20 NOTE — Telephone Encounter (Signed)
Please refill times 5 

## 2016-05-29 ENCOUNTER — Telehealth: Payer: Self-pay

## 2016-05-29 NOTE — Telephone Encounter (Signed)
Spoke with pt's daughter.  Reported that after the last abdominal ultrasound, a year ago, the pt. had pain/ discomfort in the abdomen, for approx. 1 week.  Stated that "due to her diverticulosis and Irritable Bowel Syndrome, her abdominal area is very sensitive", and she doesn't want her to suffer like she did last year.  Reported she has not had any problems with her stomach/ abdomen since then.  Advised will discuss with Dr. Oneida Alar, and return call to daughter tomorrow.  Agreed.

## 2016-05-29 NOTE — Telephone Encounter (Signed)
-----   Message from Rufina Falco sent at 05/28/2016  3:47 PM EDT ----- Regarding: Follow up abdominal ultrasound Contact: [phone] I spoke with daughter to reschedule a follow-up [facility] appointment on 8/2.  The daughter stated her mother cannot go thru another ultrasound again.  Her abdomen is tender and any pressure applied will cause pain for days where she is unable to eat.  She placed a call a year ago asking if other options were available.    Maybe a CT scan?       Daughter is Mrs. Rich Reining.

## 2016-06-01 NOTE — Telephone Encounter (Signed)
rec'd call from pt's daughter.  Stated that the pt. Has changed her mind, and is now requesting to schedule the Abd. U/S and f/u appt.  Reported the pt. stated she is willing to undergo the abdominal ultrasound.  Informed pt's daughter that a Scheduler will contact her with appt. Info.  Agreed.

## 2016-07-12 DIAGNOSIS — N1330 Unspecified hydronephrosis: Secondary | ICD-10-CM | POA: Diagnosis not present

## 2016-07-19 ENCOUNTER — Other Ambulatory Visit (HOSPITAL_COMMUNITY): Payer: Medicare Other

## 2016-07-19 ENCOUNTER — Ambulatory Visit: Payer: Medicare Other | Admitting: Vascular Surgery

## 2016-07-23 ENCOUNTER — Other Ambulatory Visit: Payer: Self-pay | Admitting: Neurology

## 2016-08-03 DIAGNOSIS — D225 Melanocytic nevi of trunk: Secondary | ICD-10-CM | POA: Diagnosis not present

## 2016-08-03 DIAGNOSIS — D224 Melanocytic nevi of scalp and neck: Secondary | ICD-10-CM | POA: Diagnosis not present

## 2016-08-03 DIAGNOSIS — L821 Other seborrheic keratosis: Secondary | ICD-10-CM | POA: Diagnosis not present

## 2016-08-03 DIAGNOSIS — D692 Other nonthrombocytopenic purpura: Secondary | ICD-10-CM | POA: Diagnosis not present

## 2016-08-03 DIAGNOSIS — L738 Other specified follicular disorders: Secondary | ICD-10-CM | POA: Diagnosis not present

## 2016-08-03 DIAGNOSIS — L720 Epidermal cyst: Secondary | ICD-10-CM | POA: Diagnosis not present

## 2016-08-03 DIAGNOSIS — Z85828 Personal history of other malignant neoplasm of skin: Secondary | ICD-10-CM | POA: Diagnosis not present

## 2016-08-03 DIAGNOSIS — D1801 Hemangioma of skin and subcutaneous tissue: Secondary | ICD-10-CM | POA: Diagnosis not present

## 2016-08-06 ENCOUNTER — Encounter: Payer: Self-pay | Admitting: Vascular Surgery

## 2016-08-08 ENCOUNTER — Other Ambulatory Visit: Payer: Self-pay | Admitting: *Deleted

## 2016-08-08 DIAGNOSIS — Z48812 Encounter for surgical aftercare following surgery on the circulatory system: Secondary | ICD-10-CM

## 2016-08-08 DIAGNOSIS — I714 Abdominal aortic aneurysm, without rupture, unspecified: Secondary | ICD-10-CM

## 2016-08-09 ENCOUNTER — Encounter: Payer: Self-pay | Admitting: Vascular Surgery

## 2016-08-09 ENCOUNTER — Ambulatory Visit (HOSPITAL_COMMUNITY)
Admission: RE | Admit: 2016-08-09 | Discharge: 2016-08-09 | Disposition: A | Discharge status: Home / Self Care | Payer: Medicare Other | Source: Ambulatory Visit | Attending: Vascular Surgery | Admitting: Vascular Surgery

## 2016-08-09 ENCOUNTER — Ambulatory Visit (INDEPENDENT_AMBULATORY_CARE_PROVIDER_SITE_OTHER): Payer: Medicare Other | Admitting: Vascular Surgery

## 2016-08-09 VITALS — BP 140/80 | HR 77 | Temp 97.6°F | Resp 16 | Ht 67.0 in | Wt 168.0 lb

## 2016-08-09 DIAGNOSIS — Z48812 Encounter for surgical aftercare following surgery on the circulatory system: Secondary | ICD-10-CM | POA: Diagnosis not present

## 2016-08-09 DIAGNOSIS — I714 Abdominal aortic aneurysm, without rupture, unspecified: Secondary | ICD-10-CM

## 2016-08-09 NOTE — Progress Notes (Signed)
Patient is a 82 year old female who underwent endovascular stent graft repair of abdominal aortic aneurysm in July 2014. Overall she has been doing well.  She denies abdominal or back pain. Her aneurysm was 5.3 cm preoperatively. She has no history of stroke TIA or amaurosis. She is currently living with her daughter but continues to perform most of her own activities of daily living.   Review of systems: She denies shortness of breath. She denies chest pain.   Physical exam:    Vitals:   08/09/16 1015  BP: 140/80  Pulse: 77  Resp: 16  Temp: 97.6 F (36.4 C)  TempSrc: Oral  SpO2: 98%  Weight: 168 lb (76.2 kg)  Height: 5\' 7"  (1.702 m)   Abdomen: Soft nontender nondistended no pulsatile mass   Extremities.: 2+ femoral pulses bilaterally   Neck: No right carotid bruit, soft left carotid bruit   Chest:  Clear to auscultation bilaterally   Cardiac: Regular rate and rhythm    Data: AAA 4.7 cm compared to to 3.7 cm 1 year ago.  Patient had a recent carotid duplex exam which showed bilateral less than 40% stenosis in 2016.   Assessment/Plan: 1 cm growth of her aorta diameter compared to 1 year ago by ultrasound. No evidence of endoleak so not sure if this represents discrepancy in technique. However in light of the patient's age and she is certainly less than 5 cm in diameter would favor observation for now with repeat ultrasound in 6 months time.    Ruta Hinds, MD [facility] Office: [phone] Pager: [phone]

## 2016-08-13 ENCOUNTER — Other Ambulatory Visit: Payer: Self-pay | Admitting: Vascular Surgery

## 2016-08-13 DIAGNOSIS — I714 Abdominal aortic aneurysm, without rupture, unspecified: Secondary | ICD-10-CM

## 2016-08-22 ENCOUNTER — Other Ambulatory Visit: Payer: Self-pay | Admitting: Neurology

## 2016-08-28 ENCOUNTER — Telehealth: Payer: Self-pay | Admitting: Neurology

## 2016-08-28 NOTE — Telephone Encounter (Signed)
Error

## 2016-09-04 ENCOUNTER — Encounter: Payer: Self-pay | Admitting: Neurology

## 2016-09-04 ENCOUNTER — Ambulatory Visit (INDEPENDENT_AMBULATORY_CARE_PROVIDER_SITE_OTHER): Payer: Medicare Other | Admitting: Neurology

## 2016-09-04 VITALS — BP 114/54 | HR 90 | Resp 18 | Ht 67.0 in | Wt 168.5 lb

## 2016-09-04 DIAGNOSIS — M48061 Spinal stenosis, lumbar region without neurogenic claudication: Secondary | ICD-10-CM

## 2016-09-04 DIAGNOSIS — M4806 Spinal stenosis, lumbar region: Secondary | ICD-10-CM

## 2016-09-04 DIAGNOSIS — R4781 Slurred speech: Secondary | ICD-10-CM

## 2016-09-04 DIAGNOSIS — I6523 Occlusion and stenosis of bilateral carotid arteries: Secondary | ICD-10-CM

## 2016-09-04 MED ORDER — CLOPIDOGREL BISULFATE 75 MG PO TABS: ORAL_TABLET | ORAL | 11 refills | Status: DC

## 2016-09-04 NOTE — Progress Notes (Signed)
[facility]  PATIENT: Ashlee Reyes DOB: [birth year]  REFERRING DOCTOR OR PCP:  Alma Friendly, NP  SOURCE: patient, records from EMR and CT images on PACS.  _________________________________   HISTORICAL  CHIEF COMPLAINT:  Chief Complaint  Patient presents with  . History of CVA    Dtr. sts. pt. has not had any further episodes of slurred speech.  Pt. sts. about a week ago, she felt like she was not pronouncing her words right, like her tongue was thick, but she was with her dtr. at the time, and dtr. sts. her speech was fine. Sts. she continues Plavix  as rx'd.  She also takes Nexium for gi issues/fim  . Aphasia    HISTORY OF PRESENT ILLNESS:  Ashlee Reyes is a 81 year old woman who had the onset of headache on 05/24/2015 followed by slurred speech and aphasia.  Symptoms took several weeks to completely recover.    CT scan of the head and a carotid Doppler study were performed 05/2016.  CT scan of the head shows minimal small vessel ischemic changes with tiny chronic lacunar infarction near the head of the left lateral ventricle. The extent of atrophy is actually better than average for age. The carotid Doppler showed <39% stenosis in both arteries.  She also has history of CAD and peripheral vascular disease.  She had another episode of headache and slurred speech but no facial weakness.    She also felt off baalced.   She improved over 2 days.     She is on both Nexium and Zantac for her stomach and esophageal issues. Zantac monotherapy was insufficient to help her in the past.   We discussed interactions with Plavix.   She is unable to tolerate aspirin.     She has a poor gait and LBP due to L4L5 spinal stenosis of the lumbar spine. She has had procedures in the past to help with her back pain with some benefit in the past.   Her left knee sometimes give out.    REVIEW OF SYSTEMS: Constitutional: No fevers, chills, sweats, or change in  appetite Eyes: No visual changes, double vision, eye pain Ear, nose and throat: No hearing loss, ear pain, nasal congestion, sore throat Cardiovascular: No chest pain, palpitations Respiratory: No shortness of breath at rest or with exertion.   No wheezes GastrointestinaI: No nausea, vomiting.   She has GERD Genitourinary: No dysuria, urinary retention or frequency.  No nocturia. Musculoskeletal: She has back pain due to spinal stenosis Integumentary: No rash, pruritus, skin lesions Neurological: as above Psychiatric: No depression at this time.  No anxiety Endocrine: No palpitations, diaphoresis, change in appetite, change in weigh or increased thirst Hematologic/Lymphatic: No anemia, purpura, petechiae. Allergic/Immunologic: No itchy/runny eyes, nasal congestion, recent allergic reactions, rashes  ALLERGIES: Allergies  Allergen Reactions  . Amoxicillin-Pot Clavulanate     REACTION: sick  . Avelox [Moxifloxacin Hcl In Nacl] Nausea Only  . Ibuprofen     REACTION: GI  . Keflex [Cephalexin]     abd pain / GI upset  . Nitrofurantoin Other (See Comments)    Chest pain or indigestion with nausea  . Omeprazole Nausea Only  . Prevnar [Pneumococcal 13-Val Conj Vacc] Itching    Severe local reaction with redness and itching   . Rosuvastatin     REACTION: foot pain  . Sertraline Hcl     REACTION: more depressed  . Tetanus Toxoids     Local redness and swelling  HOME MEDICATIONS:  Current Outpatient Prescriptions:  .  acetaminophen (TYLENOL) 500 MG tablet, Take 500 mg by mouth every 4 (four) hours as needed for mild pain or moderate pain. For pain, Disp: , Rfl:  .  albuterol (PROAIR HFA) 108 (90 Base) MCG/ACT inhaler, Inhale 2 puffs into the lungs every 4 (four) hours as needed for wheezing., Disp: 1 Inhaler, Rfl: 5 .  cetirizine (ZYRTEC) 10 MG tablet, Take 10 mg by mouth daily as needed. For allergies, Disp: , Rfl:  .  clopidogrel (PLAVIX) 75 MG tablet, Take one tablet by  mouth daily. MUST BE SEEN PRIOR TO FUTURE REFILLS.  PLEASE CALL [phone] FOR AN APPOINTMENT., Disp: 30 tablet, Rfl: 0 .  esomeprazole (NEXIUM) 40 MG capsule, Take 1 capsule (40 mg total) by mouth daily., Disp: 30 capsule, Rfl: 11 .  fluticasone (FLONASE) 50 MCG/ACT nasal spray, Place 2 sprays into both nostrils daily., Disp: 16 g, Rfl: 0 .  latanoprost (XALATAN) 0.005 % ophthalmic solution, Place 1 drop into both eyes at bedtime.  , Disp: , Rfl:  .  Meth-Hyo-M Bl-Na Phos-Ph Sal (URIBEL) 118 MG CAPS, Take 1 capsule (118 mg total) by mouth 3 (three) times daily as needed (urination frequency)., Disp: 90 capsule, Rfl: 5 .  Polyethyl Glycol-Propyl Glycol (SYSTANE OP), Place 1 drop into both eyes as needed (dry eyes). , Disp: , Rfl:  .  polyethylene glycol (MIRALAX / GLYCOLAX) packet, Take 17 g by mouth daily as needed. For constipation, Disp: , Rfl:  .  Probiotic Product (PROBIOTIC DAILY PO), Take 1 capsule by mouth., Disp: , Rfl:  .  ranitidine (ZANTAC) 300 MG tablet, Take 1 tablet (300 mg total) by mouth at bedtime., Disp: 30 tablet, Rfl: 2 .  trimethoprim (TRIMPEX) 100 MG tablet, Take 100 mg by mouth 2 (two) times daily., Disp: , Rfl:  .  beclomethasone (QVAR) 80 MCG/ACT inhaler, Inhale 1 puff into the lungs 2 (two) times daily. (Patient not taking: Reported on 09/04/2016), Disp: 1 Inhaler, Rfl: 11  PAST MEDICAL HISTORY: Past Medical History:  Diagnosis Date  . AAA (abdominal aortic aneurysm) ([place])    followed by Dr Oneida Alar. Last u/s07/10 stable AAA with largest measurement 3.97 x 4.02cm.  . Allergic rhinitis, seasonal   . Asthma   . Atrial fibrillation ([place])   . CAD (coronary artery disease) 07/2009   mod by cath 08/10  . Complication of anesthesia   . Depression   . Disc disease, degenerative, lumbar or lumbosacral    SPINAL STENOSIS  . Diverticulosis   . GERD (gastroesophageal reflux disease)   . Hemorrhoids   . History of recurrent UTIs   . Hyperlipidemia   . IBS (irritable bowel  syndrome)   . IC (irritable colon)    DMSO in past  . Interstitial cystitis   . Osteoarthritis    spine/spinal stenosis  . Osteopenia 2011  . PONV (postoperative nausea and vomiting)   . Skin cancer    basal cell cancer - face.    PAST SURGICAL HISTORY: Past Surgical History:  Procedure Laterality Date  . ABDOMINAL AORTIC ENDOVASCULAR STENT GRAFT N/A 07/01/2013   Procedure: ABDOMINAL AORTIC ENDOVASCULAR STENT GRAFT- GORE;  Surgeon: Elam Dutch, MD;  Location: [facility];  Service: Vascular;  Laterality: N/A;  Ultrasound guided  . ABDOMINAL HYSTERECTOMY     partial not cancer  . BREAST SURGERY     lumpectomy  . CYSTOCELE REPAIR    . ESOPHAGOGASTRODUODENOSCOPY  09/1998   erosive esphagitis ? stricture treated  12/2002  . HERNIA REPAIR  [id number]   umbilical hernia  . RECTOCELE REPAIR    . REFRACTIVE SURGERY     for film after cataract surgery.  Marland Kitchen RIGHT COLECTOMY  1997   benign  . SKIN LESION EXCISION  05/2011   basal cell skin lesion removed.    FAMILY HISTORY: Family History  Problem Relation Age of Onset  . Heart disease Mother     deceased age 16 heart attack.  Marland Kitchen Heart attack Mother   . Other Mother     varicose veins  . Varicose Veins Mother   . Heart disease Father     age 69 heart attack  . Cancer Father   . Stroke Brother   . Cancer Brother   . Heart disease Brother   . Heart attack Brother   . Cancer Daughter   . Diabetes Son     SOCIAL HISTORY:  Social History   Social History  . Marital status: Married    Spouse name: N/A  . Number of children: N/A  . Years of education: N/A   Occupational History  . Not on file.   Social History Main Topics  . Smoking status: Never Smoker  . Smokeless tobacco: Never Used  . Alcohol use No  . Drug use: No  . Sexual activity: No   Other Topics Concern  . Not on file   Social History Narrative  . No narrative on file     PHYSICAL EXAM  Vitals:   09/04/16 1403  BP: (!) 114/54  Pulse: 90  Resp:  18  Weight: 168 lb 8 oz (76.4 kg)  Height: 5\' 7"  (1.702 m)    Body mass index is 26.39 kg/m.   General: The patient is well-developed and well-nourished and in no acute distress  Neck: The neck is supple, no carotid bruits are noted.  The neck is nontender.  Cardiovascular: The heart has a regular rate and rhythm with a normal S1 and S2. There were no murmurs, gallops or rubs.     Neurologic Exam  Mental status: The patient is alert and oriented x 3 at the time of the examination. The patient has apparent normal recent and remote memory, with an apparently normal attention span and concentration ability.   Speech is normal.  Cranial nerves: Extraocular movements are full.  There is good facial sensation to soft touch bilaterally.Facial strength is normal.  Trapezius and sternocleidomastoid strength is normal. Minimal  dysarthria is noted.  The tongue is midline, and the patient has symmetric elevation of the soft palate. She has mild reduced hearing bilaterally.  Motor:  Muscle bulk is normal.   Tone is normal. Strength is  5 / 5 in all 4 extremities.   Sensory: Sensory testing is intact to soft touch and vibration sensation in all 4 extremities.  Coordination: Cerebellar testing reveals good finger-nose-finger and bilaterally.  Gait and station: Station is normal.   Gait is normal for age.   She leans forward as she walks. Tandem gait is poor. Romberg is negative.   Reflexes: Deep tendon reflexes are symmetric and 1+ bilaterally.       DIAGNOSTIC DATA (LABS, IMAGING, TESTING) - I reviewed patient records, labs, notes, testing and imaging myself where available.  Lab Results  Component Value Date   WBC 7.1 02/06/2016   HGB 13.4 02/06/2016   HCT 39.8 02/06/2016   MCV 88.6 02/06/2016   PLT 254.0 02/06/2016      Component Value Date/Time  NA 142 02/06/2016 1023   K 4.4 02/06/2016 1023   CL 107 02/06/2016 1023   CO2 29 02/06/2016 1023   GLUCOSE 91 02/06/2016 1023    BUN 18 02/06/2016 1023   CREATININE 0.74 02/06/2016 1023   CREATININE 0.82 07/07/2014 1250   CALCIUM 9.3 02/06/2016 1023   PROT 6.6 02/06/2016 1023   ALBUMIN 3.7 02/06/2016 1023   AST 13 02/06/2016 1023   ALT 8 02/06/2016 1023   ALKPHOS 53 02/06/2016 1023   BILITOT 0.4 02/06/2016 1023   GFRNONAA 53 (L) 01/12/2015 0154   GFRAA 61 (L) 01/12/2015 0154   Lab Results  Component Value Date   CHOL 245 (H) 02/06/2016   HDL 43.40 02/06/2016   LDLCALC 179 (H) 02/08/2015   LDLDIRECT 161.0 02/06/2016   TRIG 221.0 (H) 02/06/2016   CHOLHDL 6 02/06/2016   No results found for: HGBA1C No results found for: VITAMINB12 Lab Results  Component Value Date   TSH 3.49 02/06/2016       ASSESSMENT AND PLAN  Slurred speech  Carotid stenosis, bilateral  Spinal stenosis of lumbar region    1.   Continue Plavix 2.   Try to be active 3.   I did not schedule a follow-up at this time but will be happy to see her back if any new or worsening neurologic symptoms occur. I asked her daughters to call if they note any major changes.  Richard A. Felecia Shelling, MD, PhD [id number], [id number] PM Certified in Neurology, Clinical Neurophysiology, Sleep Medicine, Pain Medicine and Neuroimaging  [facility] [address] [place], [place] [address] [phone]

## 2016-09-13 ENCOUNTER — Other Ambulatory Visit: Payer: Self-pay | Admitting: Physician Assistant

## 2016-09-13 ENCOUNTER — Ambulatory Visit
Admission: RE | Admit: 2016-09-13 | Discharge: 2016-09-13 | Disposition: A | Discharge status: Home / Self Care | Payer: Medicare Other | Source: Ambulatory Visit | Attending: Physician Assistant | Admitting: Physician Assistant

## 2016-09-13 DIAGNOSIS — K5730 Diverticulosis of large intestine without perforation or abscess without bleeding: Secondary | ICD-10-CM

## 2016-09-13 DIAGNOSIS — R1900 Intra-abdominal and pelvic swelling, mass and lump, unspecified site: Secondary | ICD-10-CM | POA: Diagnosis not present

## 2016-09-13 DIAGNOSIS — K5900 Constipation, unspecified: Secondary | ICD-10-CM

## 2016-09-13 DIAGNOSIS — K921 Melena: Secondary | ICD-10-CM | POA: Diagnosis not present

## 2016-09-13 DIAGNOSIS — R1084 Generalized abdominal pain: Secondary | ICD-10-CM | POA: Diagnosis not present

## 2016-09-13 DIAGNOSIS — K5909 Other constipation: Secondary | ICD-10-CM

## 2016-09-13 DIAGNOSIS — R110 Nausea: Secondary | ICD-10-CM | POA: Diagnosis not present

## 2016-09-14 ENCOUNTER — Telehealth: Payer: Self-pay

## 2016-09-14 NOTE — Telephone Encounter (Signed)
Ashlee Reyes left v/m requesting new rx for Nexium taking one capsule twice a day; pt was seen by PA at Dr Perley Jain office due to stomach bothering her and Ashlee Reyes wanted to get nexium rx thru Dr Glori Bickers instead of PA.Ashlee Reyes said next week would be OK.Please advise. Pt last seen 01/2016. [place]. Have not changed instructions until approved by Dr Glori Bickers.

## 2016-09-16 MED ORDER — ESOMEPRAZOLE MAGNESIUM 40 MG PO CPDR: 40.0000 mg | DELAYED_RELEASE_CAPSULE | Freq: Two times a day (BID) | ORAL | 11 refills | Status: DC

## 2016-09-16 NOTE — Telephone Encounter (Signed)
I did sent it with the new directions Please let her know that the nexium does reduct effectiveness of plavix somewhat-I don't know if she discussed this with Dr Perley Jain office at all

## 2016-09-17 NOTE — Telephone Encounter (Signed)
Linda notified of Dr. Marliss Coots comments and that Rx was sent to pharmacy, [name] said that Dr. Perley Jain office did advise her of the Plavix interaction and they said that the BID dosing on med will just be a temporary thing so hopefully it wont affect it to much

## 2016-09-19 ENCOUNTER — Ambulatory Visit
Admission: RE | Admit: 2016-09-19 | Discharge: 2016-09-19 | Disposition: A | Discharge status: Home / Self Care | Payer: Medicare Other | Source: Ambulatory Visit | Attending: Physician Assistant | Admitting: Physician Assistant

## 2016-09-19 DIAGNOSIS — K5730 Diverticulosis of large intestine without perforation or abscess without bleeding: Secondary | ICD-10-CM | POA: Diagnosis not present

## 2016-09-19 DIAGNOSIS — K5909 Other constipation: Secondary | ICD-10-CM

## 2016-09-19 DIAGNOSIS — R1084 Generalized abdominal pain: Secondary | ICD-10-CM

## 2016-09-19 MED ORDER — IOPAMIDOL (ISOVUE-300) INJECTION 61%
100.0000 mL | Freq: Once | INTRAVENOUS | Status: AC | PRN
  Administered 2016-09-19: 100 mL via INTRAVENOUS

## 2016-09-28 DIAGNOSIS — K581 Irritable bowel syndrome with constipation: Secondary | ICD-10-CM | POA: Diagnosis not present

## 2016-10-09 ENCOUNTER — Other Ambulatory Visit: Payer: Self-pay | Admitting: Allergy and Immunology

## 2016-10-09 DIAGNOSIS — H401113 Primary open-angle glaucoma, right eye, severe stage: Secondary | ICD-10-CM | POA: Diagnosis not present

## 2016-10-09 DIAGNOSIS — H43813 Vitreous degeneration, bilateral: Secondary | ICD-10-CM | POA: Diagnosis not present

## 2016-10-09 DIAGNOSIS — H401123 Primary open-angle glaucoma, left eye, severe stage: Secondary | ICD-10-CM | POA: Diagnosis not present

## 2016-10-09 DIAGNOSIS — H01001 Unspecified blepharitis right upper eyelid: Secondary | ICD-10-CM | POA: Diagnosis not present

## 2016-11-02 ENCOUNTER — Ambulatory Visit (INDEPENDENT_AMBULATORY_CARE_PROVIDER_SITE_OTHER): Payer: Medicare Other

## 2016-11-02 DIAGNOSIS — Z23 Encounter for immunization: Secondary | ICD-10-CM

## 2017-02-08 ENCOUNTER — Encounter: Payer: Self-pay | Admitting: Vascular Surgery

## 2017-02-14 ENCOUNTER — Ambulatory Visit (INDEPENDENT_AMBULATORY_CARE_PROVIDER_SITE_OTHER): Payer: Medicare Other | Admitting: Vascular Surgery

## 2017-02-14 ENCOUNTER — Encounter: Payer: Self-pay | Admitting: Vascular Surgery

## 2017-02-14 ENCOUNTER — Ambulatory Visit (HOSPITAL_COMMUNITY)
Admission: RE | Admit: 2017-02-14 | Discharge: 2017-02-14 | Disposition: A | Discharge status: Home / Self Care | Payer: Medicare Other | Source: Ambulatory Visit | Attending: Vascular Surgery | Admitting: Vascular Surgery

## 2017-02-14 VITALS — BP 156/83 | HR 70 | Temp 98.0°F | Resp 20 | Ht 67.0 in | Wt 167.8 lb

## 2017-02-14 DIAGNOSIS — I714 Abdominal aortic aneurysm, without rupture, unspecified: Secondary | ICD-10-CM

## 2017-02-14 NOTE — Progress Notes (Signed)
Patient is a 83 year old female who underwent endovascular stent graft repair of abdominal aortic aneurysm in July 2014. Overall she has been doing well.  She denies abdominal or back pain. Her aneurysm was 5.3 cm preoperatively. She has no history of stroke TIA or amaurosis. She is currently living with her daughter but continues to perform most of her own activities of daily living.   Review of systems: She denies shortness of breath. She denies chest pain.   Physical exam:    Vitals:   02/14/17 0937  BP: (!) 156/83  Pulse: 70  Resp: 20  Temp: 98 F (36.7 C)  TempSrc: Oral  SpO2: 98%  Weight: 167 lb 12.8 oz (76.1 kg)  Height: 5\' 7"  (1.702 m)   Abdomen: Soft nontender nondistended no pulsatile mass   Extremities.: 2+ femoral pulses bilaterally   Neck: No right carotid bruit, soft left carotid bruit (Duplex one year ago showed no significant stenosis)   Chest:  Clear to auscultation bilaterally   Cardiac: Regular rate and rhythm    Data: AAA 4.1 cm compared to to 4.7 cm 1 year ago. 3.7 2 years ago.    Assessment/Plan: Aortic diameter has varied from 3.7 cm to as high as 4.7 cm and today 4.1 cm in diameter. No evidence of endoleak so not sure if this represents discrepancy in technique. However in light of the patient's age and she is certainly less than 5 cm in diameter would favor observation for now with repeat ultrasound in 6 months time.    Ruta Hinds, MD [facility] Office: [phone] Pager: [phone]

## 2017-02-22 NOTE — Addendum Note (Signed)
Addended by: Lianne Cure A on: 02/22/2017 09:16 AM   Modules accepted: Orders

## 2017-03-06 ENCOUNTER — Telehealth: Payer: Self-pay | Admitting: Family Medicine

## 2017-03-06 DIAGNOSIS — I2510 Atherosclerotic heart disease of native coronary artery without angina pectoris: Secondary | ICD-10-CM

## 2017-03-06 DIAGNOSIS — E7800 Pure hypercholesterolemia, unspecified: Secondary | ICD-10-CM

## 2017-03-06 NOTE — Telephone Encounter (Signed)
Labs ordered.

## 2017-03-07 ENCOUNTER — Ambulatory Visit (INDEPENDENT_AMBULATORY_CARE_PROVIDER_SITE_OTHER): Payer: Medicare Other

## 2017-03-07 ENCOUNTER — Other Ambulatory Visit: Payer: Self-pay

## 2017-03-07 VITALS — BP 132/80 | HR 67 | Temp 97.8°F | Ht 65.5 in | Wt 169.8 lb

## 2017-03-07 DIAGNOSIS — Z0000 Encounter for general adult medical examination without abnormal findings: Secondary | ICD-10-CM | POA: Diagnosis not present

## 2017-03-07 DIAGNOSIS — E7800 Pure hypercholesterolemia, unspecified: Secondary | ICD-10-CM

## 2017-03-07 DIAGNOSIS — I2510 Atherosclerotic heart disease of native coronary artery without angina pectoris: Secondary | ICD-10-CM | POA: Diagnosis not present

## 2017-03-07 LAB — COMPREHENSIVE METABOLIC PANEL
ALT: 12 U/L (ref 0–35)
AST: 13 U/L (ref 0–37)
Albumin: 3.9 g/dL (ref 3.5–5.2)
Alkaline Phosphatase: 60 U/L (ref 39–117)
BILIRUBIN TOTAL: 0.7 mg/dL (ref 0.2–1.2)
BUN: 18 mg/dL (ref 6–23)
CO2: 28 meq/L (ref 19–32)
Calcium: 9.8 mg/dL (ref 8.4–10.5)
Chloride: 106 mEq/L (ref 96–112)
Creatinine, Ser: 0.93 mg/dL (ref 0.40–1.20)
GFR: 59.72 mL/min — AB (ref >60.00)
GLUCOSE: 95 mg/dL (ref 70–99)
Potassium: 4.4 mEq/L (ref 3.5–5.1)
SODIUM: 141 meq/L (ref 135–145)
TOTAL PROTEIN: 7.1 g/dL (ref 6.0–8.3)

## 2017-03-07 LAB — LIPID PANEL
Cholesterol: 256 mg/dL — ABNORMAL HIGH (ref 0–200)
HDL: 53.8 mg/dL (ref >39.00)
LDL Cholesterol: 173 mg/dL — ABNORMAL HIGH (ref 0–99)
NONHDL: 201.86
Total CHOL/HDL Ratio: 5
Triglycerides: 145 mg/dL (ref 0.0–149.0)
VLDL: 29 mg/dL (ref 0.0–40.0)

## 2017-03-07 LAB — CBC WITH DIFFERENTIAL/PLATELET
BASOS PCT: 0.9 % (ref 0.0–3.0)
Basophils Absolute: 0.1 10*3/uL (ref 0.0–0.1)
EOS PCT: 1.2 % (ref 0.0–5.0)
Eosinophils Absolute: 0.1 10*3/uL (ref 0.0–0.7)
HCT: 40.6 % (ref 36.0–46.0)
Hemoglobin: 13.6 g/dL (ref 12.0–15.0)
LYMPHS ABS: 2 10*3/uL (ref 0.7–4.0)
Lymphocytes Relative: 27.8 % (ref 12.0–46.0)
MCHC: 33.6 g/dL (ref 30.0–36.0)
MCV: 89.1 fl (ref 78.0–100.0)
MONO ABS: 0.5 10*3/uL (ref 0.1–1.0)
Monocytes Relative: 7.4 % (ref 3.0–12.0)
NEUTROS PCT: 62.7 % (ref 43.0–77.0)
Neutro Abs: 4.4 10*3/uL (ref 1.4–7.7)
Platelets: 225 10*3/uL (ref 150.0–400.0)
RBC: 4.55 Mil/uL (ref 3.87–5.11)
RDW: 13.9 % (ref 11.5–15.5)
WBC: 7 10*3/uL (ref 4.0–10.5)

## 2017-03-07 LAB — TSH: TSH: 4.03 u[IU]/mL (ref 0.35–4.50)

## 2017-03-07 MED ORDER — ALBUTEROL SULFATE HFA 108 (90 BASE) MCG/ACT IN AERS: 2.0000 | INHALATION_SPRAY | RESPIRATORY_TRACT | 5 refills | Status: DC | PRN

## 2017-03-07 MED ORDER — FLUTICASONE PROPIONATE 50 MCG/ACT NA SUSP: 2.0000 | Freq: Every day | NASAL | 5 refills | Status: DC

## 2017-03-07 MED ORDER — TRIMETHOPRIM 100 MG PO TABS: 100.0000 mg | ORAL_TABLET | Freq: Two times a day (BID) | ORAL | 5 refills | Status: DC

## 2017-03-07 NOTE — Telephone Encounter (Signed)
Please refill all for 6 mo 

## 2017-03-07 NOTE — Progress Notes (Signed)
PCP notes:   Health maintenance:  No gaps identified.  Abnormal screenings:   Mini-Cog score: 16    Patient concerns:   Pt reports intermittent pain in back, knees, and feet.   Pt reports urinary incontinence that occurs 2-3 times per week before she can get to toilet. Small amount of urine. Does not impact everyday life.   Daughter [name] requested medication refills. Refill request sent to PCP.  Nurse concerns:  None  Next PCP appt:   03/12/17 @ 0900

## 2017-03-07 NOTE — Progress Notes (Signed)
Subjective:   Ashlee Reyes is a 83 y.o. female who presents for Medicare Annual (Subsequent) preventive examination.  Review of Systems:  N/A Cardiac Risk Factors include: advanced age (>53men, >63 women);dyslipidemia     Objective:     Vitals: BP 132/80 (BP Location: Right Arm, Patient Position: Sitting, Cuff Size: Normal)   Pulse 67   Temp 97.8 F (36.6 C) (Oral)   Ht 5' 5.5" (1.664 m) Comment: shoes  Wt 169 lb 12 oz (77 kg)   LMP 12/17/1968   SpO2 97%   BMI 27.82 kg/m   Body mass index is 27.82 kg/m.   Tobacco History  Smoking Status  . Never Smoker  Smokeless Tobacco  . Never Used     Counseling given: No   Past Medical History:  Diagnosis Date  . AAA (abdominal aortic aneurysm) ([place])    followed by Dr Oneida Alar. Last u/s07/10 stable AAA with largest measurement 3.97 x 4.02cm.  . Allergic rhinitis, seasonal   . Asthma   . Atrial fibrillation ([place])   . CAD (coronary artery disease) 07/2009   mod by cath 08/10  . Complication of anesthesia   . Depression   . Disc disease, degenerative, lumbar or lumbosacral    SPINAL STENOSIS  . Diverticulosis   . GERD (gastroesophageal reflux disease)   . Hemorrhoids   . History of recurrent UTIs   . Hyperlipidemia   . IBS (irritable bowel syndrome)   . IC (irritable colon)    DMSO in past  . Interstitial cystitis   . Osteoarthritis    spine/spinal stenosis  . Osteopenia 2011  . PONV (postoperative nausea and vomiting)   . Skin cancer    basal cell cancer - face.   Past Surgical History:  Procedure Laterality Date  . ABDOMINAL AORTIC ENDOVASCULAR STENT GRAFT N/A 07/01/2013   Procedure: ABDOMINAL AORTIC ENDOVASCULAR STENT GRAFT- GORE;  Surgeon: Elam Dutch, MD;  Location: [facility];  Service: Vascular;  Laterality: N/A;  Ultrasound guided  . ABDOMINAL HYSTERECTOMY     partial not cancer  . BREAST SURGERY     lumpectomy  . CYSTOCELE REPAIR    . ESOPHAGOGASTRODUODENOSCOPY  09/1998   erosive esphagitis  ? stricture treated 12/2002  . HERNIA REPAIR  66/2947   umbilical hernia  . RECTOCELE REPAIR    . REFRACTIVE SURGERY     for film after cataract surgery.  Marland Kitchen RIGHT COLECTOMY  1997   benign  . SKIN LESION EXCISION  05/2011   basal cell skin lesion removed.   Family History  Problem Relation Age of Onset  . Heart disease Mother     deceased age 92 heart attack.  Marland Kitchen Heart attack Mother   . Other Mother     varicose veins  . Varicose Veins Mother   . Heart disease Father     age 82 heart attack  . Cancer Father   . Stroke Brother   . Cancer Brother   . Heart disease Brother   . Heart attack Brother   . Cancer Daughter   . Diabetes Son    History  Sexual Activity  . Sexual activity: No    Outpatient Encounter Prescriptions as of 03/07/2017  Medication Sig  . acetaminophen (TYLENOL) 500 MG tablet Take 500 mg by mouth every 4 (four) hours as needed for mild pain or moderate pain. For pain  . albuterol (PROAIR HFA) 108 (90 Base) MCG/ACT inhaler Inhale 2 puffs into the lungs every 4 (four)  hours as needed for wheezing.  . cetirizine (ZYRTEC) 10 MG tablet Take 10 mg by mouth daily as needed. For allergies  . clopidogrel (PLAVIX) 75 MG tablet Take one tablet by mouth daily.  Marland Kitchen esomeprazole (NEXIUM) 40 MG capsule Take 1 capsule (40 mg total) by mouth 2 (two) times daily before a meal.  . latanoprost (XALATAN) 0.005 % ophthalmic solution Place 1 drop into both eyes at bedtime.    . Meth-Hyo-M Bl-Na Phos-Ph Sal (URIBEL) 118 MG CAPS Take 1 capsule (118 mg total) by mouth 3 (three) times daily as needed (urination frequency).  [name] Glycol-Propyl Glycol (SYSTANE OP) Place 1 drop into both eyes as needed (dry eyes).   . polyethylene glycol (MIRALAX / GLYCOLAX) packet Take 17 g by mouth daily as needed. For constipation  . Probiotic Product (PROBIOTIC DAILY PO) Take 1 capsule by mouth.  . ranitidine (ZANTAC) 300 MG tablet Take 1 tablet (300 mg total) by mouth at bedtime.  Marland Kitchen  trimethoprim (TRIMPEX) 100 MG tablet Take 100 mg by mouth 2 (two) times daily.  . beclomethasone (QVAR) 80 MCG/ACT inhaler Inhale 1 puff into the lungs 2 (two) times daily. (Patient not taking: Reported on 09/04/2016)  . fluticasone (FLONASE) 50 MCG/ACT nasal spray Place 2 sprays into both nostrils daily. (Patient not taking: Reported on 02/14/2017)   No facility-administered encounter medications on file as of 03/07/2017.     Activities of Daily Living In your present state of health, do you have any difficulty performing the following activities: 03/07/2017  Hearing? Y  Vision? N  Difficulty concentrating or making decisions? N  Walking or climbing stairs? Y  Dressing or bathing? N  Doing errands, shopping? Y  Preparing Food and eating ? N  Using the Toilet? N  In the past six months, have you accidently leaked urine? Y  Do you have problems with loss of bowel control? N  Managing your Medications? N  Managing your Finances? N  Housekeeping or managing your Housekeeping? N  Some recent data might be hidden    Patient Care Team: Abner Greenspan, MD as PCP - General Jiles Prows, MD as Consulting Physician (Allergy and Immunology) Bjorn Loser, MD as Consulting Physician (Urology) Rolm Bookbinder, MD as Consulting Physician (Dermatology) Elam Dutch, MD as Consulting Physician (Vascular Surgery) Britt Bottom, MD as Consulting Physician (Neurology) Clarene Essex, MD as Consulting Physician (Gastroenterology) Amy Kalman Drape, MD as Referring Physician (Audiology) Marygrace Drought, MD as Consulting Physician (Ophthalmology)    Assessment:    Hearing Screening Comments: Hearing aids - both ears Vision Screening Comments: Last vision exam in April 2017 with Dr. Satira Sark  Exercise Activities and Dietary recommendations Current Exercise Habits: Home exercise routine, Type of exercise: walking, Time (Minutes): 15, Frequency (Times/Week): 2, Weekly Exercise (Minutes/Week): 30, Intensity:  Mild, Exercise limited by: None identified  Goals    . Increase physical activity          When weather permits, I will continue to walk 10-15 minutes 2-3 days per week.       Fall Risk Fall Risk  03/07/2017 03/05/2016 02/15/2015 01/13/2013  Falls in the past year? No No No No   Depression Screen PHQ 2/9 Scores 03/07/2017 03/05/2016 02/15/2015 01/13/2013  PHQ - 2 Score 1 0 0 0     Cognitive Function MMSE - Mini Mental State Exam 03/07/2017 03/05/2016  Orientation to time 5 5  Orientation to Place 5 5  Registration 3 3  Attention/ Calculation 0 5  Recall 0 pt was unable to recall 3 of 3 words 3  Language- name 2 objects 0 0  Language- repeat 1 0  Language- follow 3 step command 2 pt was unable to follow 1 step of 3 step command 3  Language- read & follow direction 0 1  Write a sentence 0 0  Copy design 0 0  Total score 16 25     PLEASE NOTE: A Mini-Cog screen was completed. Maximum score is 20. A value of 0 denotes this part of Folstein MMSE was not completed or the patient failed this part of the Mini-Cog screening.   Mini-Cog Screening Orientation to Time - Max 5 pts Orientation to Place - Max 5 pts Registration - Max 3 pts Recall - Max 3 pts Language Repeat - Max 1 pts Language Follow 3 Step Command - Max 3 pts     Immunization History  Administered Date(s) Administered  . Influenza Split 09/17/2012  . Influenza Whole 10/02/2005, 09/18/2007, 09/14/2008, 09/14/2009, 12/13/2010  . Influenza,inj,Quad PF,36+ Mos 09/30/2013, 02/15/2015, 03/05/2016, 11/02/2016  . Pneumococcal Conjugate-13 02/15/2015  . Td 07/29/2012   Screening Tests Health Maintenance  Topic Date Due  . MAMMOGRAM  02/14/2021 (Originally 10/23/2003)  . PNA vac Low Risk Adult (2 of 2 - PPSV23) 02/14/2021 (Originally 02/15/2016)  . TETANUS/TDAP  07/29/2022  . INFLUENZA VACCINE  Completed  . DEXA SCAN  Completed      Plan:     I have personally reviewed and addressed the Medicare Annual Wellness  questionnaire and have noted the following in the patient's chart:  A. Medical and social history B. Use of alcohol, tobacco or illicit drugs  C. Current medications and supplements D. Functional ability and status E.  Nutritional status F.  Physical activity G. Advance directives H. List of other physicians I.  Hospitalizations, surgeries, and ER visits in previous 12 months J.  [place] to include hearing, vision, cognitive, depression L. Referrals and appointments - none  In addition, I have reviewed and discussed with patient certain preventive protocols, quality metrics, and best practice recommendations. A written personalized care plan for preventive services as well as general preventive health recommendations were provided to patient.  See attached scanned questionnaire for additional information.   Signed,   Lindell Noe, MHA, BS, LPN Health Coach

## 2017-03-07 NOTE — Telephone Encounter (Signed)
Patient was in today for AWV. Daughter, [name], requested refills for Albuterol Inhaler, Flonase, and Trimethoprim.   Per [name], Dr. Matilde Sprang had prescribed the Trimethoprim for patient due to recurrent UTIs. He advised her to ask PCP to take over prescribing this medication.

## 2017-03-07 NOTE — Progress Notes (Signed)
Pre visit review using our clinic review tool, if applicable. No additional management support is needed unless otherwise documented below in the visit note. 

## 2017-03-07 NOTE — Progress Notes (Signed)
I reviewed health advisor's note, was available for consultation, and agree with documentation and plan.  

## 2017-03-07 NOTE — Patient Instructions (Signed)
Ashlee Reyes , Thank you for taking time to come for your Medicare Wellness Visit. I appreciate your ongoing commitment to your health goals. Please review the following plan we discussed and let me know if I can assist you in the future.   These are the goals we discussed: Goals    . Increase physical activity          When weather permits, I will continue to walk 10-15 minutes 2-3 days per week.        This is a list of the screening recommended for you and due dates:  Health Maintenance  Topic Date Due  . Mammogram  02/14/2021*  . Pneumonia vaccines (2 of 2 - PPSV23) 02/14/2021*  . Tetanus Vaccine  07/29/2022  . Flu Shot  Completed  . DEXA scan (bone density measurement)  Completed  *Topic was postponed. The date shown is not the original due date.   Preventive Care for Adults  A healthy lifestyle and preventive care can promote health and wellness. Preventive health guidelines for adults include the following key practices.  . A routine yearly physical is a good way to check with your health care provider about your health and preventive screening. It is a chance to share any concerns and updates on your health and to receive a thorough exam.  . Visit your dentist for a routine exam and preventive care every 6 months. Brush your teeth twice a day and floss once a day. Good oral hygiene prevents tooth decay and gum disease.  . The frequency of eye exams is based on your age, health, family medical history, use  of contact lenses, and other factors. Follow your health care provider's ecommendations for frequency of eye exams.  . Eat a healthy diet. Foods like vegetables, fruits, whole grains, low-fat dairy products, and lean protein foods contain the nutrients you need without too many calories. Decrease your intake of foods high in solid fats, added sugars, and salt. Eat the right amount of calories for you. Get information about a proper diet from your health care provider, if  necessary.  . Regular physical exercise is one of the most important things you can do for your health. Most adults should get at least 150 minutes of moderate-intensity exercise (any activity that increases your heart rate and causes you to sweat) each week. In addition, most adults need muscle-strengthening exercises on 2 or more days a week.  Silver Sneakers may be a benefit available to you. To determine eligibility, you may visit the website: www.silversneakers.com or contact program at 513-783-8597 Mon-Fri between 8AM-8PM.   . Maintain a healthy weight. The body mass index (BMI) is a screening tool to identify possible weight problems. It provides an estimate of body fat based on height and weight. Your health care provider can find your BMI and can help you achieve or maintain a healthy weight.   For adults 20 years and older: ? A BMI below 18.5 is considered underweight. ? A BMI of 18.5 to 24.9 is normal. ? A BMI of 25 to 29.9 is considered overweight. ? A BMI of 30 and above is considered obese.   . Maintain normal blood lipids and cholesterol levels by exercising and minimizing your intake of saturated fat. Eat a balanced diet with plenty of fruit and vegetables. Blood tests for lipids and cholesterol should begin at age 80 and be repeated every 5 years. If your lipid or cholesterol levels are high, you are over 50, or  you are at high risk for heart disease, you may need your cholesterol levels checked more frequently. Ongoing high lipid and cholesterol levels should be treated with medicines if diet and exercise are not working.  . If you smoke, find out from your health care provider how to quit. If you do not use tobacco, please do not start.  . If you choose to drink alcohol, please do not consume more than 2 drinks per day. One drink is considered to be 12 ounces (355 mL) of beer, 5 ounces (148 mL) of wine, or 1.5 ounces (44 mL) of liquor.  . If you are 75-19 years old, ask your  health care provider if you should take aspirin to prevent strokes.  . Use sunscreen. Apply sunscreen liberally and repeatedly throughout the day. You should seek shade when your shadow is shorter than you. Protect yourself by wearing long sleeves, pants, a wide-brimmed hat, and sunglasses year round, whenever you are outdoors.  . Once a month, do a whole body skin exam, using a mirror to look at the skin on your back. Tell your health care provider of new moles, moles that have irregular borders, moles that are larger than a pencil eraser, or moles that have changed in shape or color.

## 2017-03-12 ENCOUNTER — Encounter: Payer: Self-pay | Admitting: Family Medicine

## 2017-03-12 ENCOUNTER — Ambulatory Visit (INDEPENDENT_AMBULATORY_CARE_PROVIDER_SITE_OTHER): Payer: Medicare Other | Admitting: Family Medicine

## 2017-03-12 VITALS — BP 122/62 | HR 77 | Temp 98.1°F | Ht 65.5 in | Wt 171.0 lb

## 2017-03-12 DIAGNOSIS — I6523 Occlusion and stenosis of bilateral carotid arteries: Secondary | ICD-10-CM | POA: Diagnosis not present

## 2017-03-12 DIAGNOSIS — M8589 Other specified disorders of bone density and structure, multiple sites: Secondary | ICD-10-CM

## 2017-03-12 DIAGNOSIS — N390 Urinary tract infection, site not specified: Secondary | ICD-10-CM

## 2017-03-12 DIAGNOSIS — E7800 Pure hypercholesterolemia, unspecified: Secondary | ICD-10-CM | POA: Diagnosis not present

## 2017-03-12 DIAGNOSIS — I2510 Atherosclerotic heart disease of native coronary artery without angina pectoris: Secondary | ICD-10-CM

## 2017-03-12 DIAGNOSIS — E785 Hyperlipidemia, unspecified: Secondary | ICD-10-CM | POA: Insufficient documentation

## 2017-03-12 NOTE — Progress Notes (Signed)
Pre visit review using our clinic review tool, if applicable. No additional management support is needed unless otherwise documented below in the visit note. 

## 2017-03-12 NOTE — Progress Notes (Signed)
Subjective:    Patient ID: Ashlee Reyes, female    DOB: June 22, 1921, 83 y.o.   MRN: [id number]  HPI Here for f/u of chronic medical problems  Spinal stenosis is her worst problem Seeing Dr Ernestina Patches for this   Wt Readings from Last 3 Encounters:  03/12/17 171 lb (77.6 kg)  03/07/17 169 lb 12 oz (77 kg)  02/14/17 167 lb 12.8 oz (76.1 kg)  good appetite- eating "too much"  Gained wt over the winter  Is more active when the weather gets better    Had AMW 3/22 Mini cog score 16  (missed all 3 word recall and unable to follow one step of 3 step command)  Pt thinks her memory is fair - has good and bad days Very high functioning  She got nervous during the exam -thinks that why she missed some things on the screen  Hearing interferes with her responses  Family does not see any issues     dexa 10/11 osteopenia  Not interested in another dexa  Not falling but is a fall risk (needs to use walker and cane)  No fractures    utd imms  Zoster vaccine - doubts she is interested at this time- perhaps later  Is worried about a rxn May check on coverage later  Wants to get back injection   Partial hysterectomy in the past   Hx of carotid stenosis  Hx of CAD On plavix   Lab Results  Component Value Date   CHOL 256 (H) 03/07/2017   CHOL 245 (H) 02/06/2016   CHOL 247 (H) 02/08/2015   Lab Results  Component Value Date   HDL 53.80 03/07/2017   HDL 43.40 02/06/2016   HDL 42.90 02/08/2015   Lab Results  Component Value Date   LDLCALC 173 (H) 03/07/2017   LDLCALC 179 (H) 02/08/2015   LDLCALC 170 (H) 01/12/2015   Lab Results  Component Value Date   TRIG 145.0 03/07/2017   TRIG 221.0 (H) 02/06/2016   TRIG 127.0 02/08/2015   Lab Results  Component Value Date   CHOLHDL 5 03/07/2017   CHOLHDL 6 02/06/2016   CHOLHDL 6 02/08/2015   Lab Results  Component Value Date   LDLDIRECT 161.0 02/06/2016   LDLDIRECT 150.5 01/06/2013   LDLDIRECT 173.9 12/05/2010  cannot  tolerate rovastatin   Results for orders placed or performed in visit on 03/07/17  CBC with Differential/Platelet  Result Value Ref Range   WBC 7.0 4.0 - 10.5 K/uL   RBC 4.55 3.87 - 5.11 Mil/uL   Hemoglobin 13.6 12.0 - 15.0 g/dL   HCT 40.6 36.0 - 46.0 %   MCV 89.1 78.0 - 100.0 fl   MCHC 33.6 30.0 - 36.0 g/dL   RDW 13.9 11.5 - 15.5 %   Platelets 225.0 150.0 - 400.0 K/uL   Neutrophils Relative % 62.7 43.0 - 77.0 %   Lymphocytes Relative 27.8 12.0 - 46.0 %   Monocytes Relative 7.4 3.0 - 12.0 %   Eosinophils Relative 1.2 0.0 - 5.0 %   Basophils Relative 0.9 0.0 - 3.0 %   Neutro Abs 4.4 1.4 - 7.7 K/uL   Lymphs Abs 2.0 0.7 - 4.0 K/uL   Monocytes Absolute 0.5 0.1 - 1.0 K/uL   Eosinophils Absolute 0.1 0.0 - 0.7 K/uL   Basophils Absolute 0.1 0.0 - 0.1 K/uL  Comprehensive metabolic panel  Result Value Ref Range   Sodium 141 135 - 145 mEq/L   Potassium 4.4 3.5 -  5.1 mEq/L   Chloride 106 96 - 112 mEq/L   CO2 28 19 - 32 mEq/L   Glucose, Bld 95 70 - 99 mg/dL   BUN 18 6 - 23 mg/dL   Creatinine, Ser 0.93 0.40 - 1.20 mg/dL   Total Bilirubin 0.7 0.2 - 1.2 mg/dL   Alkaline Phosphatase 60 39 - 117 U/L   AST 13 0 - 37 U/L   ALT 12 0 - 35 U/L   Total Protein 7.1 6.0 - 8.3 g/dL   Albumin 3.9 3.5 - 5.2 g/dL   Calcium 9.8 8.4 - 10.5 mg/dL   GFR 59.72 (L) >60.00 mL/min  Lipid panel  Result Value Ref Range   Cholesterol 256 (H) 0 - 200 mg/dL   Triglycerides 145.0 0.0 - 149.0 mg/dL   HDL 53.80 >39.00 mg/dL   VLDL 29.0 0.0 - 40.0 mg/dL   LDL Cholesterol 173 (H) 0 - 99 mg/dL   Total CHOL/HDL Ratio 5    NonHDL 201.86   TSH  Result Value Ref Range   TSH 4.03 0.35 - 4.50 uIU/mL      Has gerd Takes nexium - no heartburn but does get irritated throat (she thinks it is from allergies)  Allergies- uses flonase  Lots of drainage  Breathing Uses proair prn -has not needed - uses in season for allergic   Zyrtec daily  Used to see Dr Carmelina Peal   Patient Active Problem List   Diagnosis Date  Noted  . Hyperlipidemia 03/12/2017  . Frequent UTI 02/06/2016  . Hypotension 01/09/2016  . Slurred speech 06/01/2015  . Carotid stenosis 06/01/2015  . Local reaction to immunization 02/21/2015  . Encounter for Medicare annual wellness exam 02/15/2015  . Estrogen deficiency 02/15/2015  . Sinusitis, chronic 01/12/2015  . IBS (irritable bowel syndrome) 01/12/2015  . Atypical chest pain 01/11/2015  . Dizziness   . Pulsatile tinnitus of left ear 07/08/2014  . Palpitations 05/13/2014  . Diarrhea 12/23/2013  . Gastritis 10/28/2013  . Second degree heart block 10/18/2013  . Syncope 10/17/2013  . Abdominal aneurysm without mention of rupture 06/25/2013  . Osteopenia 01/13/2013  . CAD (coronary artery disease)   . Chronic depression 06/03/2007  . GERD 06/02/2007  . Asthma   . Chronic interstitial cystitis   . Osteoarthritis   . Spinal stenosis of lumbar region   . History of urinary tract infection    Past Medical History:  Diagnosis Date  . AAA (abdominal aortic aneurysm) ([place])    followed by Dr Oneida Alar. Last u/s07/10 stable AAA with largest measurement 3.97 x 4.02cm.  . Allergic rhinitis, seasonal   . Asthma   . Atrial fibrillation ([place])   . CAD (coronary artery disease) 07/2009   mod by cath 08/10  . Complication of anesthesia   . Depression   . Disc disease, degenerative, lumbar or lumbosacral    SPINAL STENOSIS  . Diverticulosis   . GERD (gastroesophageal reflux disease)   . Hemorrhoids   . History of recurrent UTIs   . Hyperlipidemia   . IBS (irritable bowel syndrome)   . IC (irritable colon)    DMSO in past  . Interstitial cystitis   . Osteoarthritis    spine/spinal stenosis  . Osteopenia 2011  . PONV (postoperative nausea and vomiting)   . Skin cancer    basal cell cancer - face.   Past Surgical History:  Procedure Laterality Date  . ABDOMINAL AORTIC ENDOVASCULAR STENT GRAFT N/A 07/01/2013   Procedure: ABDOMINAL AORTIC ENDOVASCULAR STENT GRAFT-  GORE;   Surgeon: Elam Dutch, MD;  Location: [facility];  Service: Vascular;  Laterality: N/A;  Ultrasound guided  . ABDOMINAL HYSTERECTOMY     partial not cancer  . BREAST SURGERY     lumpectomy  . CYSTOCELE REPAIR    . ESOPHAGOGASTRODUODENOSCOPY  09/1998   erosive esphagitis ? stricture treated 12/2002  . HERNIA REPAIR  41/9622   umbilical hernia  . RECTOCELE REPAIR    . REFRACTIVE SURGERY     for film after cataract surgery.  Marland Kitchen RIGHT COLECTOMY  1997   benign  . SKIN LESION EXCISION  05/2011   basal cell skin lesion removed.   Social History  Substance Use Topics  . Smoking status: Never Smoker  . Smokeless tobacco: Never Used  . Alcohol use No   Family History  Problem Relation Age of Onset  . Heart disease Mother     deceased age 43 heart attack.  Marland Kitchen Heart attack Mother   . Other Mother     varicose veins  . Varicose Veins Mother   . Heart disease Father     age 44 heart attack  . Cancer Father   . Stroke Brother   . Cancer Brother   . Heart disease Brother   . Heart attack Brother   . Cancer Daughter   . Diabetes Son    Allergies  Allergen Reactions  . Amoxicillin-Pot Clavulanate     REACTION: sick  . Avelox [Moxifloxacin Hcl In Nacl] Nausea Only  . Ibuprofen     REACTION: GI  . Keflex [Cephalexin]     abd pain / GI upset  . Nitrofurantoin Other (See Comments)    Chest pain or indigestion with nausea  . Omeprazole Nausea Only  . Prevnar [Pneumococcal 13-Val Conj Vacc] Itching    Severe local reaction with redness and itching   . Rosuvastatin     REACTION: foot pain  . Sertraline Hcl     REACTION: more depressed  . Tetanus Toxoids     Local redness and swelling    Current Outpatient Prescriptions on File Prior to Visit  Medication Sig Dispense Refill  . acetaminophen (TYLENOL) 500 MG tablet Take 500 mg by mouth every 4 (four) hours as needed for mild pain or moderate pain. For pain    . albuterol (PROAIR HFA) 108 (90 Base) MCG/ACT inhaler Inhale 2 puffs  into the lungs every 4 (four) hours as needed for wheezing. 1 Inhaler 5  . beclomethasone (QVAR) 80 MCG/ACT inhaler Inhale 1 puff into the lungs 2 (two) times daily. 1 Inhaler 11  . cetirizine (ZYRTEC) 10 MG tablet Take 10 mg by mouth daily as needed. For allergies    . clopidogrel (PLAVIX) 75 MG tablet Take one tablet by mouth daily. 30 tablet 11  . esomeprazole (NEXIUM) 40 MG capsule Take 1 capsule (40 mg total) by mouth 2 (two) times daily before a meal. 60 capsule 11  . fluticasone (FLONASE) 50 MCG/ACT nasal spray Place 2 sprays into both nostrils daily. 16 g 5  . latanoprost (XALATAN) 0.005 % ophthalmic solution Place 1 drop into both eyes at bedtime.      . Meth-Hyo-M Bl-Na Phos-Ph Sal (URIBEL) 118 MG CAPS Take 1 capsule (118 mg total) by mouth 3 (three) times daily as needed (urination frequency). 90 capsule 5  . Polyethyl Glycol-Propyl Glycol (SYSTANE OP) Place 1 drop into both eyes as needed (dry eyes).     . polyethylene glycol (MIRALAX / GLYCOLAX) packet Take  17 g by mouth daily as needed. For constipation    . Probiotic Product (PROBIOTIC DAILY PO) Take 1 capsule by mouth.    . ranitidine (ZANTAC) 300 MG tablet Take 1 tablet (300 mg total) by mouth at bedtime. 30 tablet 2  . trimethoprim (TRIMPEX) 100 MG tablet Take 1 tablet (100 mg total) by mouth 2 (two) times daily. 60 tablet 5   No current facility-administered medications on file prior to visit.      Review of Systems Review of Systems  Constitutional: Negative for fever, appetite change, fatigue and unexpected weight change.  Eyes: Negative for pain and visual disturbance. ENT pos for rhinitis and congestion   Respiratory: Negative for cough and shortness of breath.   Cardiovascular: Negative for cp or palpitations    Gastrointestinal: Negative for nausea, diarrhea and constipation.  Genitourinary: Negative for urgency and frequency.  Skin: Negative for pallor or rash   MSK pos for chronic back pain from spinal stenosis    Neurological: Negative for weakness, light-headedness, numbness and headaches.  Hematological: Negative for adenopathy. Does not bruise/bleed easily.  Psychiatric/Behavioral: Negative for dysphoric mood. The patient is not nervous/anxious.         Objective:   Physical Exam  Constitutional: She appears well-developed and well-nourished. No distress.  Well appearing elderly female  HENT:  Head: Normocephalic and atraumatic.  Right Ear: External ear normal.  Left Ear: External ear normal.  Nose: Nose normal.  Mouth/Throat: Oropharynx is clear and moist.  Eyes: Conjunctivae and EOM are normal. Pupils are equal, round, and reactive to light. Right eye exhibits no discharge. Left eye exhibits no discharge. No scleral icterus.  Neck: Normal range of motion. Neck supple. No JVD present. Carotid bruit is present. No thyromegaly present.  Cardiovascular: Normal rate, regular rhythm, normal heart sounds and intact distal pulses.  Exam reveals no gallop.   Pulmonary/Chest: Effort normal and breath sounds normal. No respiratory distress. She has no wheezes. She has no rales. She exhibits no tenderness.  Abdominal: Soft. Bowel sounds are normal. She exhibits no distension and no mass. There is no tenderness.  No suprapubic tenderness or fullness    Genitourinary:  Genitourinary Comments: Declines breast screening due to age  Musculoskeletal: She exhibits no edema or tenderness.  Mild kyphosis  Poor rom of TS and LS  Lymphadenopathy:    She has no cervical adenopathy.  Neurological: She is alert. She has normal reflexes. No cranial nerve deficit. She exhibits normal muscle tone. Coordination normal.  Skin: Skin is warm and dry. No rash noted. No erythema. No pallor.  Lentigines and sks diffusely  Psychiatric: She has a normal mood and affect.  Quite mentally sharp for her age           Assessment & Plan:   Problem List Items Addressed This Visit      Cardiovascular and Mediastinum    CAD (coronary artery disease) (Chronic)    On plavix Intolerant of statins  No new developments       Carotid stenosis    On plavix  Cannot tolerate statins        Musculoskeletal and Integument   Osteopenia - Primary    dexa 2011 Not interested in another  No falls or fractures  On ca and D  Disc fall prev in detail        Genitourinary   Frequent UTI    Trimethoprim continues to work well for prophylaxis  Other   Hyperlipidemia    Disc goals for lipids and reasons to control them Rev labs with pt Rev low sat fat diet in detail She is intolerant of statins  Disc diet

## 2017-03-12 NOTE — Patient Instructions (Addendum)
If you are interested in a shingles/zoster vaccine - call your insurance to check on coverage,( you should not get it within 1 month of other vaccines) , then call us for a prescription  for it to take to a pharmacy that gives the shot , or make a nurse visit to get it here depending on your coverage  Start back on flonase for allergies and zyrtec   Take care  Stay as active as you can  Socialize to help memory and cognition

## 2017-03-14 NOTE — Assessment & Plan Note (Signed)
On plavix  Cannot tolerate statins

## 2017-03-14 NOTE — Assessment & Plan Note (Signed)
Trimethoprim continues to work well for prophylaxis

## 2017-03-14 NOTE — Assessment & Plan Note (Signed)
dexa 2011 Not interested in another  No falls or fractures  On ca and D  Disc fall prev in detail

## 2017-03-14 NOTE — Assessment & Plan Note (Signed)
Disc goals for lipids and reasons to control them Rev labs with pt Rev low sat fat diet in detail She is intolerant of statins  Disc diet

## 2017-03-14 NOTE — Assessment & Plan Note (Signed)
On plavix Intolerant of statins  No new developments

## 2017-03-19 ENCOUNTER — Ambulatory Visit (INDEPENDENT_AMBULATORY_CARE_PROVIDER_SITE_OTHER): Payer: Self-pay | Admitting: Physical Medicine and Rehabilitation

## 2017-04-04 ENCOUNTER — Telehealth: Payer: Self-pay | Admitting: Family Medicine

## 2017-04-04 NOTE — Telephone Encounter (Signed)
Pts daughter [name] called on 04/03/18 to discuss a bill that the pt received for labs.  She is being billed $12 for the venous puncture for the lab draw.  Pt was told by medicare that this should have been included in the bill for the lab work.  I spoke with [name] to reviewed and confirmed that the wrong code was used.  She will correct and refile the claim.    I called and left a message for [name] today to let her know the claim will be corrected and refiled.

## 2017-04-08 DIAGNOSIS — H401133 Primary open-angle glaucoma, bilateral, severe stage: Secondary | ICD-10-CM | POA: Diagnosis not present

## 2017-04-08 DIAGNOSIS — H353222 Exudative age-related macular degeneration, left eye, with inactive choroidal neovascularization: Secondary | ICD-10-CM | POA: Diagnosis not present

## 2017-04-11 DIAGNOSIS — H353131 Nonexudative age-related macular degeneration, bilateral, early dry stage: Secondary | ICD-10-CM | POA: Diagnosis not present

## 2017-04-29 ENCOUNTER — Ambulatory Visit (INDEPENDENT_AMBULATORY_CARE_PROVIDER_SITE_OTHER): Payer: Medicare Other | Admitting: Primary Care

## 2017-04-29 ENCOUNTER — Encounter: Payer: Self-pay | Admitting: Primary Care

## 2017-04-29 VITALS — BP 124/78 | HR 84 | Temp 97.9°F | Ht 67.0 in | Wt 168.4 lb

## 2017-04-29 DIAGNOSIS — I6523 Occlusion and stenosis of bilateral carotid arteries: Secondary | ICD-10-CM

## 2017-04-29 DIAGNOSIS — R350 Frequency of micturition: Secondary | ICD-10-CM | POA: Diagnosis not present

## 2017-04-29 LAB — CBC WITH DIFFERENTIAL/PLATELET
Basophils Absolute: 0.1 10*3/uL (ref 0.0–0.1)
Basophils Relative: 0.7 % (ref 0.0–3.0)
EOS ABS: 0.1 10*3/uL (ref 0.0–0.7)
EOS PCT: 1.7 % (ref 0.0–5.0)
HCT: 37.9 % (ref 36.0–46.0)
Hemoglobin: 12.9 g/dL (ref 12.0–15.0)
LYMPHS ABS: 2.4 10*3/uL (ref 0.7–4.0)
LYMPHS PCT: 31 % (ref 12.0–46.0)
MCHC: 34.1 g/dL (ref 30.0–36.0)
MCV: 88.7 fl (ref 78.0–100.0)
MONOS PCT: 8.6 % (ref 3.0–12.0)
Monocytes Absolute: 0.7 10*3/uL (ref 0.1–1.0)
NEUTROS ABS: 4.4 10*3/uL (ref 1.4–7.7)
NEUTROS PCT: 58 % (ref 43.0–77.0)
PLATELETS: 222 10*3/uL (ref 150.0–400.0)
RBC: 4.27 Mil/uL (ref 3.87–5.11)
RDW: 14.3 % (ref 11.5–15.5)
WBC: 7.7 10*3/uL (ref 4.0–10.5)

## 2017-04-29 LAB — POC URINALSYSI DIPSTICK (AUTOMATED)
BILIRUBIN UA: NEGATIVE
Glucose, UA: NEGATIVE
Ketones, UA: NEGATIVE
LEUKOCYTES UA: NEGATIVE
NITRITE UA: NEGATIVE
PH UA: 5.5 (ref 5.0–8.0)
Spec Grav, UA: 1.015 (ref 1.010–1.025)
Urobilinogen, UA: 0.2 E.U./dL

## 2017-04-29 NOTE — Progress Notes (Signed)
Subjective:    Patient ID: Ashlee Reyes, female    DOB: December 08, 1921, 83 y.o.   MRN: [id number]  HPI  Ashlee Reyes is a 83 year old female with a history of chronic recurrent UTI, IBS who presents today with a chief complaint of urinary frequency. She also reports dysuria. Her daughter reports intermittent confusion that began Friday last week. Her symptoms wake her from sleep in the evening. She denies abdominal pain, nausea, vomiting, hematuria. She is compliant to her trimethoprim for which she takes daily for chronic UTI prevention. She's been been managed on trimentoprim for the past 2-3 years. She does see a Dealer annually and is due for an appointment in July 2018. She's not taken anything else for symptoms.  Review of Systems  Constitutional: Negative for chills, fatigue and fever.  Respiratory: Negative for cough.   Gastrointestinal: Negative for abdominal pain, constipation, diarrhea, nausea and vomiting.  Genitourinary: Positive for frequency. Negative for flank pain, hematuria, pelvic pain and urgency.  Neurological: Negative for weakness.  Psychiatric/Behavioral: Positive for confusion.       Past Medical History:  Diagnosis Date  . AAA (abdominal aortic aneurysm) ([place])    followed by Dr Oneida Alar. Last u/s07/10 stable AAA with largest measurement 3.97 x 4.02cm.  . Allergic rhinitis, seasonal   . Asthma   . Atrial fibrillation ([place])   . CAD (coronary artery disease) 07/2009   mod by cath 08/10  . Complication of anesthesia   . Depression   . Disc disease, degenerative, lumbar or lumbosacral    SPINAL STENOSIS  . Diverticulosis   . GERD (gastroesophageal reflux disease)   . Hemorrhoids   . History of recurrent UTIs   . Hyperlipidemia   . IBS (irritable bowel syndrome)   . IC (irritable colon)    DMSO in past  . Interstitial cystitis   . Osteoarthritis    spine/spinal stenosis  . Osteopenia 2011  . PONV (postoperative nausea and vomiting)   . Skin cancer     basal cell cancer - face.     Social History   Social History  . Marital status: Married    Spouse name: N/A  . Number of children: N/A  . Years of education: N/A   Occupational History  . Not on file.   Social History Main Topics  . Smoking status: Never Smoker  . Smokeless tobacco: Never Used  . Alcohol use No  . Drug use: No  . Sexual activity: No   Other Topics Concern  . Not on file   Social History Narrative  . No narrative on file    Past Surgical History:  Procedure Laterality Date  . ABDOMINAL AORTIC ENDOVASCULAR STENT GRAFT N/A 07/01/2013   Procedure: ABDOMINAL AORTIC ENDOVASCULAR STENT GRAFT- GORE;  Surgeon: Elam Dutch, MD;  Location: [facility];  Service: Vascular;  Laterality: N/A;  Ultrasound guided  . ABDOMINAL HYSTERECTOMY     partial not cancer  . BREAST SURGERY     lumpectomy  . CYSTOCELE REPAIR    . ESOPHAGOGASTRODUODENOSCOPY  09/1998   erosive esphagitis ? stricture treated 12/2002  . HERNIA REPAIR  29/5188   umbilical hernia  . RECTOCELE REPAIR    . REFRACTIVE SURGERY     for film after cataract surgery.  Marland Kitchen RIGHT COLECTOMY  1997   benign  . SKIN LESION EXCISION  05/2011   basal cell skin lesion removed.    Family History  Problem Relation Age of Onset  .  Heart disease Mother        deceased age 88 heart attack.  Marland Kitchen Heart attack Mother   . Other Mother        varicose veins  . Varicose Veins Mother   . Heart disease Father        age 64 heart attack  . Cancer Father   . Stroke Brother   . Cancer Brother   . Heart disease Brother   . Heart attack Brother   . Cancer Daughter   . Diabetes Son     Allergies  Allergen Reactions  . Amoxicillin-Pot Clavulanate     REACTION: sick  . Avelox [Moxifloxacin Hcl In Nacl] Nausea Only  . Ibuprofen     REACTION: GI  . Keflex [Cephalexin]     abd pain / GI upset  . Nitrofurantoin Other (See Comments)    Chest pain or indigestion with nausea  . Omeprazole Nausea Only  . Prevnar  [Pneumococcal 13-Val Conj Vacc] Itching    Severe local reaction with redness and itching   . Rosuvastatin     REACTION: foot pain  . Sertraline Hcl     REACTION: more depressed  . Tetanus Toxoids     Local redness and swelling     Current Outpatient Prescriptions on File Prior to Visit  Medication Sig Dispense Refill  . acetaminophen (TYLENOL) 500 MG tablet Take 500 mg by mouth every 4 (four) hours as needed for mild pain or moderate pain. For pain    . albuterol (PROAIR HFA) 108 (90 Base) MCG/ACT inhaler Inhale 2 puffs into the lungs every 4 (four) hours as needed for wheezing. 1 Inhaler 5  . beclomethasone (QVAR) 80 MCG/ACT inhaler Inhale 1 puff into the lungs 2 (two) times daily. 1 Inhaler 11  . cetirizine (ZYRTEC) 10 MG tablet Take 10 mg by mouth daily as needed. For allergies    . clopidogrel (PLAVIX) 75 MG tablet Take one tablet by mouth daily. 30 tablet 11  . esomeprazole (NEXIUM) 40 MG capsule Take 1 capsule (40 mg total) by mouth 2 (two) times daily before a meal. 60 capsule 11  . fluticasone (FLONASE) 50 MCG/ACT nasal spray Place 2 sprays into both nostrils daily. 16 g 5  . latanoprost (XALATAN) 0.005 % ophthalmic solution Place 1 drop into both eyes at bedtime.      . Meth-Hyo-M Bl-Na Phos-Ph Sal (URIBEL) 118 MG CAPS Take 1 capsule (118 mg total) by mouth 3 (three) times daily as needed (urination frequency). 90 capsule 5  . Polyethyl Glycol-Propyl Glycol (SYSTANE OP) Place 1 drop into both eyes as needed (dry eyes).     . polyethylene glycol (MIRALAX / GLYCOLAX) packet Take 17 g by mouth daily as needed. For constipation    . Probiotic Product (PROBIOTIC DAILY PO) Take 1 capsule by mouth.    . ranitidine (ZANTAC) 300 MG tablet Take 1 tablet (300 mg total) by mouth at bedtime. 30 tablet 2  . trimethoprim (TRIMPEX) 100 MG tablet Take 1 tablet (100 mg total) by mouth 2 (two) times daily. 60 tablet 5   No current facility-administered medications on file prior to visit.     BP  124/78   Pulse 84   Temp 97.9 F (36.6 C) (Oral)   Ht 5\' 7"  (1.702 m)   Wt 168 lb 6.4 oz (76.4 kg)   LMP 12/17/1968   SpO2 97%   BMI 26.38 kg/m    Objective:   Physical Exam  Constitutional: She is  oriented to person, place, and time. She appears well-nourished.  Neck: Neck supple.  Cardiovascular: Normal rate and regular rhythm.   Pulmonary/Chest: Effort normal and breath sounds normal. She has no rales.  Abdominal: There is no CVA tenderness.  Neurological: She is alert and oriented to person, place, and time.  Skin: Skin is warm and dry.  Psychiatric: She has a normal mood and affect.          Assessment & Plan:  Urinary Frequency:  Also with dysuria and some confusion per daughter. Exam today unremarkable. Alert and oriented, does not appear ill. UA: trace blood, negative leukocytes and nitrites. Culture sent given history. Will have her notify her Urologist of symptoms and her visit today. Check CBC to ensure no infection given chronic antibiotic use. Push intake of water. Will await culture results.  Sheral Flow, NP

## 2017-04-29 NOTE — Patient Instructions (Addendum)
Complete lab work prior to leaving today. I will notify you of your results once received.   Please notify your Urologist regarding your visit today. We will notify you once the Culture results return.  Ensure you are staying hydrated with water.  You may try AZO which is a medication that is purchased over the counter for your symptoms.  It was a pleasure to see you today!

## 2017-04-30 ENCOUNTER — Telehealth: Payer: Self-pay | Admitting: Family Medicine

## 2017-04-30 LAB — URINE CULTURE: Organism ID, Bacteria: NO GROWTH

## 2017-04-30 NOTE — Telephone Encounter (Signed)
Notified patient's daughter of Kate's comments and lab results.

## 2017-04-30 NOTE — Telephone Encounter (Signed)
Patient's daughter,Linda,returned Chan's call.

## 2017-06-03 DIAGNOSIS — H00015 Hordeolum externum left lower eyelid: Secondary | ICD-10-CM | POA: Diagnosis not present

## 2017-06-11 ENCOUNTER — Encounter (INDEPENDENT_AMBULATORY_CARE_PROVIDER_SITE_OTHER): Payer: Self-pay | Admitting: Physical Medicine and Rehabilitation

## 2017-06-11 ENCOUNTER — Ambulatory Visit (INDEPENDENT_AMBULATORY_CARE_PROVIDER_SITE_OTHER): Payer: Medicare Other | Admitting: Physical Medicine and Rehabilitation

## 2017-06-11 VITALS — BP 148/67 | HR 73

## 2017-06-11 DIAGNOSIS — G8929 Other chronic pain: Secondary | ICD-10-CM | POA: Diagnosis not present

## 2017-06-11 DIAGNOSIS — M48062 Spinal stenosis, lumbar region with neurogenic claudication: Secondary | ICD-10-CM

## 2017-06-11 DIAGNOSIS — M5442 Lumbago with sciatica, left side: Secondary | ICD-10-CM | POA: Diagnosis not present

## 2017-06-11 DIAGNOSIS — M5416 Radiculopathy, lumbar region: Secondary | ICD-10-CM | POA: Diagnosis not present

## 2017-06-11 DIAGNOSIS — I6523 Occlusion and stenosis of bilateral carotid arteries: Secondary | ICD-10-CM

## 2017-06-11 NOTE — Progress Notes (Deleted)
Lower back, buttock, left leg, and left knee pain. Walking and standing cause pain to increase. Not much pain with sitting.

## 2017-06-13 ENCOUNTER — Encounter (INDEPENDENT_AMBULATORY_CARE_PROVIDER_SITE_OTHER): Payer: Self-pay | Admitting: Physical Medicine and Rehabilitation

## 2017-06-13 NOTE — Progress Notes (Signed)
Ashlee Reyes - 83 y.o. female MRN [id number]  Date of birth: [birth year]  Office Visit Note: Visit Date: 06/11/2017 PCP: Ashlee Greenspan, MD Referred by: Reyes, Ashlee Fanny, MD  Subjective: Chief Complaint  Patient presents with  . Lower Back - Pain   HPI: Ashlee Reyes is a 83 year old female who was followed by Dr. Louanne Reyes in the past and I've seen on various occasions over the years. She has known significant stenosis at L3-4 and L4-5. Last injection we performed was a transforaminal injection sometime ago approximately 1 year ago. It did give her relief of her symptoms. She has been determined not to be a great surgical candidate due to her age and just her desire not to do that. She does fairly well ambulating with a cane she's had no recent falls or injuries or trauma. Her symptoms have started to recur over the last several months and is progressively gotten worse. She does not note any focal weakness. She is present with her daughter who provided some of the history. She does have pain in the left hip and leg down to about the knee. She has a lot of lower back pain as well and buttock pain. It's worse with walking and standing. It's better at rest. This all consistent with neurogenic claudication and stenosis. She currently does take Tylenol for pain relief and is really not tolerating stronger medications. She's had therapy in the past he continues to stay active with trying to walk and get around as well as she can. Her case is complicated by history of stroke and family history of stroke. She is on Plavix.    Review of Systems  Constitutional: Negative for chills, fever, malaise/fatigue and weight loss.  HENT: Negative for hearing loss and sinus pain.   Eyes: Negative for blurred vision, double vision and photophobia.  Respiratory: Negative for cough and shortness of breath.   Cardiovascular: Negative for chest pain, palpitations and leg swelling.  Gastrointestinal: Negative for  abdominal pain, nausea and vomiting.  Genitourinary: Negative for flank pain.  Musculoskeletal: Positive for back pain and joint pain. Negative for myalgias.  Skin: Negative for itching and rash.  Neurological: Negative for tremors, focal weakness and weakness.  Endo/Heme/Allergies: Negative.   Psychiatric/Behavioral: Negative for depression.  All other systems reviewed and are negative.  Otherwise per HPI.  Assessment & Plan: Visit Diagnoses:  1. Spinal stenosis of lumbar region with neurogenic claudication   2. Lumbar radiculopathy   3. Chronic bilateral low back pain with left-sided sciatica     Plan: Findings:  Chronic worsening low back and left hip and leg pain consistent more with an L3 or L4 radicular pattern. This does fit with her level of stenosis that she has had on imaging. She's had no new red flag symptoms I don't think any other imaging is required. Exam is nonfocal in terms of her strength. She has no pain with hip rotation. I do think this is still related to the lumbar spine just a flareup of the stenosis of the nerve root irritation. She did well with last injection approximately a year ago so we'll repeat an L3 and L4 transforaminal injection with fluoroscopic imaging from a diagnostic of therapeutic standpoint. Should continue with the Tylenol may take that on a more scheduled basis. We will try to get approval for taking her off the Plavix. There is a new movement to complete transforaminal injections while on the blood thinner we just discussed the wrist to  benefit of this. We are going to try to get her off the Plavix if possible if not we can readdress the risk of doing the procedure on the blood thinner.    Meds & Orders: No orders of the defined types were placed in this encounter.  No orders of the defined types were placed in this encounter.   Follow-up: Return for Left L3 and L4 transforaminal epidural steroid injection..   Procedures: No procedures performed    No notes on file   Clinical History: No specialty comments available.  She reports that she has never smoked. She has never used smokeless tobacco. No results for input(s): HGBA1C, LABURIC in the last 8760 hours.  Objective:  VS:  HT:    WT:   BMI:     BP:(!) 148/67  HR:73bpm  TEMP: ( )  RESP:  Physical Exam  Constitutional: She is oriented to person, place, and time. She appears well-developed and well-nourished.  HENT:  Head: Normocephalic and atraumatic.  Mouth/Throat: Oropharynx is clear and moist.  Eyes: Conjunctivae and EOM are normal. Pupils are equal, round, and reactive to light.  Cardiovascular: Normal rate and intact distal pulses.   Pulmonary/Chest: Effort normal.  Musculoskeletal:  Patient ambulates with a cane. She is slow to rise from a seated position she does have pain with extension of the lumbar spine. She has no pain with hip rotation. No pain over the greater trochanters. She has good distal strength without clonus.  Neurological: She is alert and oriented to person, place, and time. She exhibits normal muscle tone.  Skin: Skin is warm and dry. No rash noted. No erythema.  Psychiatric: She has a normal mood and affect. Her behavior is normal.  Nursing note and vitals reviewed.   Ortho Exam Imaging: No results found.  Past Medical/Family/Surgical/Social History: Medications & Allergies reviewed per EMR Patient Active Problem List   Diagnosis Date Noted  . Hyperlipidemia 03/12/2017  . Frequent UTI 02/06/2016  . Hypotension 01/09/2016  . Slurred speech 06/01/2015  . Carotid stenosis 06/01/2015  . Local reaction to immunization 02/21/2015  . Encounter for Medicare annual wellness exam 02/15/2015  . Estrogen deficiency 02/15/2015  . Sinusitis, chronic 01/12/2015  . IBS (irritable bowel syndrome) 01/12/2015  . Atypical chest pain 01/11/2015  . Dizziness   . Pulsatile tinnitus of left ear 07/08/2014  . Palpitations 05/13/2014  . Diarrhea 12/23/2013   . Gastritis 10/28/2013  . Second degree heart block 10/18/2013  . Syncope 10/17/2013  . Abdominal aneurysm without mention of rupture 06/25/2013  . Osteopenia 01/13/2013  . CAD (coronary artery disease)   . Chronic depression 06/03/2007  . GERD 06/02/2007  . Asthma   . Chronic interstitial cystitis   . Osteoarthritis   . Spinal stenosis of lumbar region   . History of urinary tract infection    Past Medical History:  Diagnosis Date  . AAA (abdominal aortic aneurysm) ([place])    followed by Dr Oneida Alar. Last u/s07/10 stable AAA with largest measurement 3.97 x 4.02cm.  . Allergic rhinitis, seasonal   . Asthma   . Atrial fibrillation ([place])   . CAD (coronary artery disease) 07/2009   mod by cath 08/10  . Complication of anesthesia   . Depression   . Disc disease, degenerative, lumbar or lumbosacral    SPINAL STENOSIS  . Diverticulosis   . GERD (gastroesophageal reflux disease)   . Hemorrhoids   . History of recurrent UTIs   . Hyperlipidemia   . IBS (irritable bowel  syndrome)   . IC (irritable colon)    DMSO in past  . Interstitial cystitis   . Osteoarthritis    spine/spinal stenosis  . Osteopenia 2011  . PONV (postoperative nausea and vomiting)   . Skin cancer    basal cell cancer - face.   Family History  Problem Relation Age of Onset  . Heart disease Mother        deceased age 4 heart attack.  Marland Kitchen Heart attack Mother   . Other Mother        varicose veins  . Varicose Veins Mother   . Heart disease Father        age 29 heart attack  . Cancer Father   . Stroke Brother   . Cancer Brother   . Heart disease Brother   . Heart attack Brother   . Cancer Daughter   . Diabetes Son    Past Surgical History:  Procedure Laterality Date  . ABDOMINAL AORTIC ENDOVASCULAR STENT GRAFT N/A 07/01/2013   Procedure: ABDOMINAL AORTIC ENDOVASCULAR STENT GRAFT- GORE;  Surgeon: Elam Dutch, MD;  Location: [facility];  Service: Vascular;  Laterality: N/A;  Ultrasound guided  .  ABDOMINAL HYSTERECTOMY     partial not cancer  . BREAST SURGERY     lumpectomy  . CYSTOCELE REPAIR    . ESOPHAGOGASTRODUODENOSCOPY  09/1998   erosive esphagitis ? stricture treated 12/2002  . HERNIA REPAIR  24/4010   umbilical hernia  . RECTOCELE REPAIR    . REFRACTIVE SURGERY     for film after cataract surgery.  Marland Kitchen RIGHT COLECTOMY  1997   benign  . SKIN LESION EXCISION  05/2011   basal cell skin lesion removed.   Social History   Occupational History  . Not on file.   Social History Main Topics  . Smoking status: Never Smoker  . Smokeless tobacco: Never Used  . Alcohol use No  . Drug use: No  . Sexual activity: No

## 2017-06-17 ENCOUNTER — Telehealth (INDEPENDENT_AMBULATORY_CARE_PROVIDER_SITE_OTHER): Payer: Self-pay | Admitting: Physical Medicine and Rehabilitation

## 2017-06-17 NOTE — Telephone Encounter (Signed)
-----   Message from Britt Bottom, MD sent at 06/12/2017  5:59 PM EDT ----- Regarding: RE: Plavix Yes it is okay to hold the Plavix. She needs to restart treatment with Plavix as soon as possible afterwards.  Arlice Colt, MD, PhD ----- Message ----- From: Sherre Scarlet, RT Sent: 06/11/2017  10:42 AM To: Britt Bottom, MD Subject: Plavix                                         Patient needs to schedule an epidural steroid injection with Dr. Ernestina Patches at [facility]. She will need to hold Plavix for 7 days prior if possible. Will this be ok?

## 2017-06-27 ENCOUNTER — Encounter (INDEPENDENT_AMBULATORY_CARE_PROVIDER_SITE_OTHER): Payer: Self-pay | Admitting: Physical Medicine and Rehabilitation

## 2017-06-27 ENCOUNTER — Ambulatory Visit (INDEPENDENT_AMBULATORY_CARE_PROVIDER_SITE_OTHER): Payer: Medicare Other

## 2017-06-27 ENCOUNTER — Ambulatory Visit (INDEPENDENT_AMBULATORY_CARE_PROVIDER_SITE_OTHER): Payer: Medicare Other | Admitting: Physical Medicine and Rehabilitation

## 2017-06-27 VITALS — BP 142/79 | HR 88 | Temp 98.2°F

## 2017-06-27 DIAGNOSIS — M48062 Spinal stenosis, lumbar region with neurogenic claudication: Secondary | ICD-10-CM | POA: Diagnosis not present

## 2017-06-27 DIAGNOSIS — M5416 Radiculopathy, lumbar region: Secondary | ICD-10-CM

## 2017-06-27 MED ORDER — METHYLPREDNISOLONE ACETATE 80 MG/ML IJ SUSP
80.0000 mg | Freq: Once | INTRAMUSCULAR | Status: AC
  Administered 2017-06-27: 80 mg

## 2017-06-27 MED ORDER — LIDOCAINE HCL (PF) 1 % IJ SOLN
2.0000 mL | Freq: Once | INTRAMUSCULAR | Status: AC
  Administered 2017-06-27: 2 mL

## 2017-06-27 NOTE — Progress Notes (Deleted)
Patient is here today for planned injection on the left. No change in symptoms.

## 2017-06-27 NOTE — Patient Instructions (Signed)

## 2017-07-01 NOTE — Procedures (Signed)
Lumbosacral Transforaminal Epidural Steroid Injection - Infraneural Approach with Fluoroscopic Guidance  Patient: Ashlee Reyes      Date of Birth: 07-Nov-1921 MRN: [id number] PCP: Abner Greenspan, MD      Visit Date: 06/27/2017   Mrs. Bigbee is a 83 year old female with left radicular leg pain and severe stenosis at L3-4. We seen her recently for evaluation. Please see our prior evaluation and management note for further details and justification. She's here for planned left L3 and L4 transforaminal epidural steroid injection for her radicular leg pain.  Universal Protocol:     Consent Given By: the patient  Position: PRONE   Additional Comments: Vital signs were monitored before and after the procedure. Patient was prepped and draped in the usual sterile fashion. The correct patient, procedure, and site was verified.   Injection Procedure Details:  Procedure Site One Meds Administered:  Meds ordered this encounter  Medications  . lidocaine (PF) (XYLOCAINE) 1 % injection 2 mL  . methylPREDNISolone acetate (DEPO-MEDROL) injection 80 mg      Laterality: Left  Location/Site:  L3-L4 L4-L5  Needle size: 22 G  Needle type: Spinal  Needle Placement: Transforaminal  Findings:  -Contrast Used: 0.5 mL iohexol 180 mg iodine/mL   -Comments: Excellent flow of contrast along the nerve and into the epidural space.  Procedure Details: After squaring off the end-plates of the desired vertebral level to get a true AP view, the C-arm was obliqued to the painful side so that the superior articulating process is positioned about 1/3 the length of the inferior endplate.  The needle was aimed toward the junction of the superior articular process and the transverse process of the inferior vertebrae. The needle's initial entry is in the lower third of the foramen through Kambin's triangle. The soft tissues overlying this target were infiltrated with 2-3 ml. of 1% Lidocaine without  Epinephrine.  The spinal needle was then inserted and advanced toward the target using a "trajectory" view along the fluoroscope beam.  Under AP and lateral visualization, the needle was advanced so it did not puncture dura and did not traverse medially beyond the 6 o'clock position of the pedicle. Bi-planar projections were used to confirm position. Aspiration was confirmed to be negative for CSF and/or blood. A 1-2 ml. volume of Isovue-250 was injected and flow of contrast was noted at each level. Radiographs were obtained for documentation purposes.   After attaining the desired flow of contrast documented above, a 0.5 to 1.0 ml test dose of 0.25% Marcaine was injected into each respective transforaminal space.  The patient was observed for 90 seconds post injection.  After no sensory deficits were reported, and normal lower extremity motor function was noted,   the above injectate was administered so that equal amounts of the injectate were placed at each foramen (level) into the transforaminal epidural space.   Additional Comments:  The patient tolerated the procedure well Dressing: Band-Aid    Post-procedure details: Patient was observed during the procedure. Post-procedure instructions were reviewed.  Patient left the clinic in stable condition.

## 2017-07-11 ENCOUNTER — Ambulatory Visit (INDEPENDENT_AMBULATORY_CARE_PROVIDER_SITE_OTHER): Payer: Medicare Other | Admitting: Specialist

## 2017-07-11 ENCOUNTER — Encounter (INDEPENDENT_AMBULATORY_CARE_PROVIDER_SITE_OTHER): Payer: Self-pay | Admitting: Specialist

## 2017-07-11 ENCOUNTER — Ambulatory Visit (INDEPENDENT_AMBULATORY_CARE_PROVIDER_SITE_OTHER): Payer: Medicare Other

## 2017-07-11 VITALS — BP 153/74 | HR 72 | Ht 66.5 in | Wt 165.0 lb

## 2017-07-11 DIAGNOSIS — S8002XA Contusion of left knee, initial encounter: Secondary | ICD-10-CM

## 2017-07-11 DIAGNOSIS — M25562 Pain in left knee: Secondary | ICD-10-CM

## 2017-07-11 DIAGNOSIS — S338XXA Sprain of other parts of lumbar spine and pelvis, initial encounter: Secondary | ICD-10-CM

## 2017-07-11 DIAGNOSIS — M25552 Pain in left hip: Secondary | ICD-10-CM

## 2017-07-11 DIAGNOSIS — M1712 Unilateral primary osteoarthritis, left knee: Secondary | ICD-10-CM | POA: Insufficient documentation

## 2017-07-11 MED ORDER — ACETAMINOPHEN 500 MG PO TABS: 500.0000 mg | ORAL_TABLET | Freq: Four times a day (QID) | ORAL | 0 refills | Status: DC | PRN

## 2017-07-11 NOTE — Progress Notes (Addendum)
Office Visit Note   Patient: Ashlee Reyes           Date of Birth: 1921-11-21           MRN: [id number] Visit Date: 07/11/2017              Requested by: Tower, Wynelle Fanny, MD [place], [place] [address] PCP: Abner Greenspan, MD   Assessment & Plan: Visit Diagnoses:  1. Pain in left hip   2. Acute pain of left knee    Use a walker or cane for assistance with walker. Use tylenol for pain one Extra strength tylenol 500 mg every 6 hours no more than 4 per day. Use ice to the area that is painful 15-20 min on and 3-0 min off. If the pain persists please let us know if it remains and we would consider imaging of the lumbar spine. Fall Prevention and Home Safety Falls cause injuries and can affect all age groups. It is possible to use preventive measures to significantly decrease the likelihood of falls. There are many simple measures which can make your home safer and prevent falls. OUTDOORS  Repair cracks and edges of walkways and driveways.  Remove high doorway thresholds.  Trim shrubbery on the main path into your home.  Have good outside lighting.  Clear walkways of tools, rocks, debris, and clutter.  Check that handrails are not broken and are securely fastened. Both sides of steps should have handrails.  Have leaves, snow, and ice cleared regularly.  Use sand or salt on walkways during winter months.  In the garage, clean up grease or oil spills. BATHROOM  Install night lights.  Install grab bars by the toilet and in the tub and shower.  Use non-skid mats or decals in the tub or shower.  Place a plastic non-slip stool in the shower to sit on, if needed.  Keep floors dry and clean up all water on the floor immediately.  Remove soap buildup in the tub or shower on a regular basis.  Secure bath mats with non-slip, double-sided rug tape.  Remove throw rugs and tripping hazards from the floors. BEDROOMS  Install night lights.  Make  sure a bedside light is easy to reach.  Do not use oversized bedding.  Keep a telephone by your bedside.  Have a firm chair with side arms to use for getting dressed.  Remove throw rugs and tripping hazards from the floor. KITCHEN  Keep handles on pots and pans turned toward the center of the stove. Use back burners when possible.  Clean up spills quickly and allow time for drying.  Avoid walking on wet floors.  Avoid hot utensils and knives.  Position shelves so they are not too high or low.  Place commonly used objects within easy reach.  If necessary, use a sturdy step stool with a grab bar when reaching.  Keep electrical cables out of the way.  Do not use floor polish or wax that makes floors slippery. If you must use wax, use non-skid floor wax.  Remove throw rugs and tripping hazards from the floor. STAIRWAYS  Never leave objects on stairs.  Place handrails on both sides of stairways and use them. Fix any loose handrails. Make sure handrails on both sides of the stairways are as long as the stairs.  Check carpeting to make sure it is firmly attached along stairs. Make repairs to worn or loose carpet promptly.  Avoid placing  throw rugs at the top or bottom of stairways, or properly secure the rug with carpet tape to prevent slippage. Get rid of throw rugs, if possible.  Have an electrician put in a light switch at the top and bottom of the stairs. OTHER FALL PREVENTION TIPS  Wear low-heel or rubber-soled shoes that are supportive and fit well. Wear closed toe shoes.  When using a stepladder, make sure it is fully opened and both spreaders are firmly locked. Do not climb a closed stepladder.  Add color or contrast paint or tape to grab bars and handrails in your home. Place contrasting color strips on first and last steps.  Learn and use mobility aids as needed. Install an electrical emergency response system.  Turn on lights to avoid dark areas. Replace light  bulbs that burn out immediately. Get light switches that glow.  Arrange furniture to create clear pathways. Keep furniture in the same place.  Firmly attach carpet with non-skid or double-sided tape.  Eliminate uneven floor surfaces.  Select a carpet pattern that does not visually hide the edge of steps.  Be aware of all pets. OTHER HOME SAFETY TIPS  Set the water temperature for 120 F (48.8 C).  Keep emergency numbers on or near the telephone.  Keep smoke detectors on every level of the home and near sleeping areas. Document Released: 11/23/2002 Document Revised: 06/03/2012 Document Reviewed: 02/22/2012 [facility] Patient Information 2014 [place]. Follow-Up Instructions: No Follow-up on file.   Orders:  Orders Placed This Encounter  Procedures  . XR Knee 1-2 Views Left  . XR HIP UNILAT W OR W/O PELVIS 2-3 VIEWS LEFT   No orders of the defined types were placed in this encounter.     Procedures: No procedures performed   Clinical Data: No additional findings.   Subjective: Chief Complaint  Patient presents with  . Left Knee - Pain    Had a fall on 07/05/17  . Left Hip - Pain    83 year old female was pulling weeds in the yard and she lost her balance and twisted her left knee and left hip. She fell to the ground. Her son came by and helped her up and she got up and walked to the porch. She is able to bear weight on the left leg, has to pull her left leg up at the hip to get to her left foot.  Does experience numbness in both legs and feet.     Review of Systems  Constitutional: Negative.   HENT: Positive for rhinorrhea, sinus pain and sinus pressure.   Eyes: Positive for pain, redness and itching.  Respiratory: Positive for wheezing.   Cardiovascular: Negative.   Gastrointestinal: Negative.   Endocrine: Negative.   Genitourinary: Negative.   Musculoskeletal: Negative.   Skin: Negative.   Allergic/Immunologic: Negative.   Neurological: Negative.    Hematological: Negative.   Psychiatric/Behavioral: Negative.      Objective: Vital Signs: BP (!) 153/74 (BP Location: Left Arm, Patient Position: Sitting)   Pulse 72   Ht 5' 6.5" (1.689 m)   Wt 165 lb (74.8 kg)   LMP 12/17/1968   BMI 26.23 kg/m   Physical Exam  Constitutional: She is oriented to person, place, and time. She appears well-developed and well-nourished.  HENT:  Head: Normocephalic and atraumatic.  Eyes: Pupils are equal, round, and reactive to light. EOM are normal.  Neck: Normal range of motion. Neck supple.  Pulmonary/Chest: Effort normal and breath sounds normal.  Abdominal: Soft.  Bowel sounds are normal.  Musculoskeletal: Normal range of motion.  Neurological: She is alert and oriented to person, place, and time.  Skin: Skin is warm and dry.  Psychiatric: She has a normal mood and affect. Her behavior is normal. Judgment and thought content normal.    Ortho Exam  Specialty Comments:  No specialty comments available.  Imaging: No results found.   PMFS History: Patient Active Problem List   Diagnosis Date Noted  . Hyperlipidemia 03/12/2017  . Frequent UTI 02/06/2016  . Hypotension 01/09/2016  . Slurred speech 06/01/2015  . Carotid stenosis 06/01/2015  . Local reaction to immunization 02/21/2015  . Encounter for Medicare annual wellness exam 02/15/2015  . Estrogen deficiency 02/15/2015  . Sinusitis, chronic 01/12/2015  . IBS (irritable bowel syndrome) 01/12/2015  . Atypical chest pain 01/11/2015  . Dizziness   . Pulsatile tinnitus of left ear 07/08/2014  . Palpitations 05/13/2014  . Diarrhea 12/23/2013  . Gastritis 10/28/2013  . Second degree heart block 10/18/2013  . Syncope 10/17/2013  . Abdominal aneurysm without mention of rupture 06/25/2013  . Osteopenia 01/13/2013  . CAD (coronary artery disease)   . Chronic depression 06/03/2007  . GERD 06/02/2007  . Asthma   . Chronic interstitial cystitis   . Osteoarthritis   . Spinal  stenosis of lumbar region   . History of urinary tract infection    Past Medical History:  Diagnosis Date  . AAA (abdominal aortic aneurysm) ([place])    followed by Dr Oneida Alar. Last u/s07/10 stable AAA with largest measurement 3.97 x 4.02cm.  . Allergic rhinitis, seasonal   . Asthma   . Atrial fibrillation ([place])   . CAD (coronary artery disease) 07/2009   mod by cath 08/10  . Complication of anesthesia   . Depression   . Disc disease, degenerative, lumbar or lumbosacral    SPINAL STENOSIS  . Diverticulosis   . GERD (gastroesophageal reflux disease)   . Hemorrhoids   . History of recurrent UTIs   . Hyperlipidemia   . IBS (irritable bowel syndrome)   . IC (irritable colon)    DMSO in past  . Interstitial cystitis   . Osteoarthritis    spine/spinal stenosis  . Osteopenia 2011  . PONV (postoperative nausea and vomiting)   . Skin cancer    basal cell cancer - face.    Family History  Problem Relation Age of Onset  . Heart disease Mother        deceased age 65 heart attack.  Marland Kitchen Heart attack Mother   . Other Mother        varicose veins  . Varicose Veins Mother   . Heart disease Father        age 68 heart attack  . Cancer Father   . Stroke Brother   . Cancer Brother   . Heart disease Brother   . Heart attack Brother   . Cancer Daughter   . Diabetes Son     Past Surgical History:  Procedure Laterality Date  . ABDOMINAL AORTIC ENDOVASCULAR STENT GRAFT N/A 07/01/2013   Procedure: ABDOMINAL AORTIC ENDOVASCULAR STENT GRAFT- GORE;  Surgeon: Elam Dutch, MD;  Location: [facility];  Service: Vascular;  Laterality: N/A;  Ultrasound guided  . ABDOMINAL HYSTERECTOMY     partial not cancer  . BREAST SURGERY     lumpectomy  . CYSTOCELE REPAIR    . ESOPHAGOGASTRODUODENOSCOPY  09/1998   erosive esphagitis ? stricture treated 12/2002  . HERNIA REPAIR  11/1997  umbilical hernia  . RECTOCELE REPAIR    . REFRACTIVE SURGERY     for film after cataract surgery.  Marland Kitchen RIGHT COLECTOMY   1997   benign  . SKIN LESION EXCISION  05/2011   basal cell skin lesion removed.   Social History   Occupational History  . Not on file.   Social History Main Topics  . Smoking status: Never Smoker  . Smokeless tobacco: Never Used  . Alcohol use No  . Drug use: No  . Sexual activity: No

## 2017-07-11 NOTE — Patient Instructions (Addendum)
Use a walker or cane for assistance with walker. Use tylenol for pain one Extra strength tylenol 500 mg every 6 hours no more than 4 per day. Use ice to the area that is painful 15-20 min on and 3-0 min off. If the pain persists please let us know if it remains and we would consider imaging of the lumbar spine. Fall Prevention and Home Safety Falls cause injuries and can affect all age groups. It is possible to use preventive measures to significantly decrease the likelihood of falls. There are many simple measures which can make your home safer and prevent falls. OUTDOORS  Repair cracks and edges of walkways and driveways.  Remove high doorway thresholds.  Trim shrubbery on the main path into your home.  Have good outside lighting.  Clear walkways of tools, rocks, debris, and clutter.  Check that handrails are not broken and are securely fastened. Both sides of steps should have handrails.  Have leaves, snow, and ice cleared regularly.  Use sand or salt on walkways during winter months.  In the garage, clean up grease or oil spills. BATHROOM  Install night lights.  Install grab bars by the toilet and in the tub and shower.  Use non-skid mats or decals in the tub or shower.  Place a plastic non-slip stool in the shower to sit on, if needed.  Keep floors dry and clean up all water on the floor immediately.  Remove soap buildup in the tub or shower on a regular basis.  Secure bath mats with non-slip, double-sided rug tape.  Remove throw rugs and tripping hazards from the floors. BEDROOMS  Install night lights.  Make sure a bedside light is easy to reach.  Do not use oversized bedding.  Keep a telephone by your bedside.  Have a firm chair with side arms to use for getting dressed.  Remove throw rugs and tripping hazards from the floor. KITCHEN  Keep handles on pots and pans turned toward the center of the stove. Use back burners when possible.  Clean up spills  quickly and allow time for drying.  Avoid walking on wet floors.  Avoid hot utensils and knives.  Position shelves so they are not too high or low.  Place commonly used objects within easy reach.  If necessary, use a sturdy step stool with a grab bar when reaching.  Keep electrical cables out of the way.  Do not use floor polish or wax that makes floors slippery. If you must use wax, use non-skid floor wax.  Remove throw rugs and tripping hazards from the floor. STAIRWAYS  Never leave objects on stairs.  Place handrails on both sides of stairways and use them. Fix any loose handrails. Make sure handrails on both sides of the stairways are as long as the stairs.  Check carpeting to make sure it is firmly attached along stairs. Make repairs to worn or loose carpet promptly.  Avoid placing throw rugs at the top or bottom of stairways, or properly secure the rug with carpet tape to prevent slippage. Get rid of throw rugs, if possible.  Have an electrician put in a light switch at the top and bottom of the stairs. OTHER FALL PREVENTION TIPS  Wear low-heel or rubber-soled shoes that are supportive and fit well. Wear closed toe shoes.  When using a stepladder, make sure it is fully opened and both spreaders are firmly locked. Do not climb a closed stepladder.  Add color or contrast paint or tape to  grab bars and handrails in your home. Place contrasting color strips on first and last steps.  Learn and use mobility aids as needed. Install an electrical emergency response system.  Turn on lights to avoid dark areas. Replace light bulbs that burn out immediately. Get light switches that glow.  Arrange furniture to create clear pathways. Keep furniture in the same place.  Firmly attach carpet with non-skid or double-sided tape.  Eliminate uneven floor surfaces.  Select a carpet pattern that does not visually hide the edge of steps.  Be aware of all pets. OTHER HOME SAFETY  TIPS  Set the water temperature for 120 F (48.8 C).  Keep emergency numbers on or near the telephone.  Keep smoke detectors on every level of the home and near sleeping areas. Document Released: 11/23/2002 Document Revised: 06/03/2012 Document Reviewed: 02/22/2012 [facility] Patient Information 2014 [place].

## 2017-08-14 ENCOUNTER — Encounter: Payer: Self-pay | Admitting: Vascular Surgery

## 2017-08-22 ENCOUNTER — Ambulatory Visit (INDEPENDENT_AMBULATORY_CARE_PROVIDER_SITE_OTHER): Payer: Medicare Other | Admitting: Vascular Surgery

## 2017-08-22 ENCOUNTER — Encounter: Payer: Self-pay | Admitting: Vascular Surgery

## 2017-08-22 ENCOUNTER — Ambulatory Visit (HOSPITAL_COMMUNITY)
Admission: RE | Admit: 2017-08-22 | Discharge: 2017-08-22 | Disposition: A | Discharge status: Home / Self Care | Payer: Medicare Other | Source: Ambulatory Visit | Attending: Vascular Surgery | Admitting: Vascular Surgery

## 2017-08-22 VITALS — BP 135/71 | HR 98 | Temp 97.1°F | Ht 66.5 in | Wt 168.0 lb

## 2017-08-22 DIAGNOSIS — Z9889 Other specified postprocedural states: Secondary | ICD-10-CM | POA: Insufficient documentation

## 2017-08-22 DIAGNOSIS — I714 Abdominal aortic aneurysm, without rupture, unspecified: Secondary | ICD-10-CM

## 2017-08-22 DIAGNOSIS — I6523 Occlusion and stenosis of bilateral carotid arteries: Secondary | ICD-10-CM

## 2017-08-22 NOTE — Progress Notes (Signed)
Patient is a 83 year old female who presents for follow-up after endovascular stent graft repair July 2014. She overall is doing well still is able to perform most of her daily activities. Her aneurysm was 5.37 m preoperatively. She is currently living with her daughter.  Review of systems: She tires easily. She denies shortness of breath. She denies chest pain  Physical exam:  Vitals:   08/22/17 0917  BP: 135/71  Pulse: 98  Temp: (!) 97.1 F (36.2 C)  TempSrc: Oral  SpO2: 97%  Weight: 168 lb (76.2 kg)  Height: 5' 6.5" (1.689 m)    Extremities: 2+ femoral pulses bilaterally  Abdomen: Soft nontender no pulsatile mass  Data: Patient had a duplex exam for abdominal aortic aneurysm today. I reviewed and interpreted this study. Aortic diameter was 3.8 cm today compared to 3.7 cm in 2016. This is been stable for several years.  Assessment: Doing well status post endovascular stent graft repair.  Plan: The patient will follow-up in one year with repeat aortic ultrasound  Ruta Hinds, MD [facility] Office: [phone] Pager: [phone]

## 2017-08-23 NOTE — Addendum Note (Signed)
Addended by: Lianne Cure A on: 08/23/2017 11:06 AM   Modules accepted: Orders

## 2017-08-26 DIAGNOSIS — N3946 Mixed incontinence: Secondary | ICD-10-CM | POA: Diagnosis not present

## 2017-08-26 DIAGNOSIS — N3020 Other chronic cystitis without hematuria: Secondary | ICD-10-CM | POA: Diagnosis not present

## 2017-09-05 ENCOUNTER — Emergency Department: Payer: Medicare Other

## 2017-09-05 ENCOUNTER — Telehealth: Payer: Self-pay | Admitting: Allergy and Immunology

## 2017-09-05 ENCOUNTER — Emergency Department
Admission: EM | Admit: 2017-09-05 | Discharge: 2017-09-05 | Disposition: A | Discharge status: Home / Self Care | Payer: Medicare Other | Attending: Emergency Medicine | Admitting: Emergency Medicine

## 2017-09-05 ENCOUNTER — Encounter: Payer: Self-pay | Admitting: Emergency Medicine

## 2017-09-05 DIAGNOSIS — Z79899 Other long term (current) drug therapy: Secondary | ICD-10-CM | POA: Insufficient documentation

## 2017-09-05 DIAGNOSIS — R0602 Shortness of breath: Secondary | ICD-10-CM | POA: Diagnosis not present

## 2017-09-05 DIAGNOSIS — I2510 Atherosclerotic heart disease of native coronary artery without angina pectoris: Secondary | ICD-10-CM | POA: Insufficient documentation

## 2017-09-05 DIAGNOSIS — R55 Syncope and collapse: Secondary | ICD-10-CM | POA: Diagnosis not present

## 2017-09-05 DIAGNOSIS — J45909 Unspecified asthma, uncomplicated: Secondary | ICD-10-CM | POA: Insufficient documentation

## 2017-09-05 DIAGNOSIS — R079 Chest pain, unspecified: Secondary | ICD-10-CM | POA: Diagnosis not present

## 2017-09-05 DIAGNOSIS — Z7902 Long term (current) use of antithrombotics/antiplatelets: Secondary | ICD-10-CM | POA: Insufficient documentation

## 2017-09-05 LAB — BASIC METABOLIC PANEL
ANION GAP: 8 (ref 5–15)
BUN: 19 mg/dL (ref 6–20)
CO2: 24 mmol/L (ref 22–32)
Calcium: 9 mg/dL (ref 8.9–10.3)
Chloride: 107 mmol/L (ref 101–111)
Creatinine, Ser: 0.89 mg/dL (ref 0.44–1.00)
GFR, EST NON AFRICAN AMERICAN: 54 mL/min — AB (ref >60)
GLUCOSE: 116 mg/dL — AB (ref 65–99)
POTASSIUM: 4 mmol/L (ref 3.5–5.1)
SODIUM: 139 mmol/L (ref 135–145)

## 2017-09-05 LAB — URINALYSIS, COMPLETE (UACMP) WITH MICROSCOPIC
BILIRUBIN URINE: NEGATIVE
Bacteria, UA: NONE SEEN
GLUCOSE, UA: NEGATIVE mg/dL
KETONES UR: NEGATIVE mg/dL
LEUKOCYTES UA: NEGATIVE
NITRITE: NEGATIVE
PH: 5 (ref 5.0–8.0)
Protein, ur: NEGATIVE mg/dL
Specific Gravity, Urine: 1.004 — ABNORMAL LOW (ref 1.005–1.030)

## 2017-09-05 LAB — CBC WITH DIFFERENTIAL/PLATELET
BASOS ABS: 0.1 10*3/uL (ref 0–0.1)
Basophils Relative: 1 %
EOS PCT: 1 %
Eosinophils Absolute: 0.1 10*3/uL (ref 0–0.7)
HCT: 38.7 % (ref 35.0–47.0)
Hemoglobin: 13.5 g/dL (ref 12.0–16.0)
LYMPHS PCT: 19 %
Lymphs Abs: 1.9 10*3/uL (ref 1.0–3.6)
MCH: 31.3 pg (ref 26.0–34.0)
MCHC: 34.8 g/dL (ref 32.0–36.0)
MCV: 90.1 fL (ref 80.0–100.0)
Monocytes Absolute: 1 10*3/uL — ABNORMAL HIGH (ref 0.2–0.9)
Monocytes Relative: 10 %
Neutro Abs: 7.1 10*3/uL — ABNORMAL HIGH (ref 1.4–6.5)
Neutrophils Relative %: 69 %
PLATELETS: 207 10*3/uL (ref 150–440)
RBC: 4.3 MIL/uL (ref 3.80–5.20)
RDW: 14.4 % (ref 11.5–14.5)
WBC: 10.2 10*3/uL (ref 3.6–11.0)

## 2017-09-05 LAB — TROPONIN I

## 2017-09-05 MED ORDER — SODIUM CHLORIDE 0.9 % IV BOLUS (SEPSIS)
500.0000 mL | Freq: Once | INTRAVENOUS | Status: AC
  Administered 2017-09-05: 500 mL via INTRAVENOUS

## 2017-09-05 NOTE — Telephone Encounter (Signed)
Patients daughter called and stated that her mom was a patient and she was having some breathing issues and wanted to see Dr. Neldon Mc. I informed patient Dr. Neldon Mc is not in the [facility] office today or tomorrow. Patient was last seen 01/18/2015. Daughter said she would take her to the urgent care and then follow up with Dr. Neldon Mc since it has been over 2 years since she was last seen. Daughter will call back today or tomorrow to inform me how her mother was doing as well as schedule the appt.

## 2017-09-05 NOTE — ED Notes (Signed)
edp at bedside with update.

## 2017-09-05 NOTE — ED Provider Notes (Signed)
[facility] Emergency Department Provider Note  ____________________________________________   First MD Initiated Contact with Patient 09/05/17 1550     (approximate)  I have reviewed the triage vital signs and the nursing notes.   HISTORY  Chief Complaint Shortness of Breath   HPI Ashlee Reyes is a 83 y.o. female with a history of abdominal aortic aneurysm status post stenting in 2014 [name] CAD as well as "irregular heart rate "per history by the daughter who is presenting to the emergency department today with overnight lightheadedness, headache, chest pain and left arm pain as well as low blood pressure and a fast heart rate. The patient says that she is pain-free at this time. [name] that previously she was having chest pressure as well as low back pressure and pressure left arm which has now been relieved. Coinciding with this she also had a headache to the top and back of her head which is also relieved at this time. Her daughter says that she had taken her blood pressure as well as heart rate earlier this morning and found the patient to have a blood pressure in the 60s as well as a heart rate to approximately 105. The patient is also complaining of feeling like she has mucous to the back of her throat which has been ongoing for the past several weeks. The daughter says that the patient has had similar issues with UTIs in the past as well as dehydration. The daughter says that she, after finding of the blood pressure was low, gave the patient fluids including chicken broth and water as well as breakfast. Since then, she has had improvement in her blood pressure as well as relief of her symptoms. The patient initially presented to the [facility] clinic earlier today and was sent to the emergency department for further evaluation. I [facility] clinic already was given 65 and the low 100s. Patient is on prophylactic Bactrim. She is not complaining any abdominal pain  at this time or dysuria. Says that her back pain is also gone but previously was to the low lumbar region. Patient also has a history of spinal stenosis that worsens when she sits forward.   Past Medical History:  Diagnosis Date  . AAA (abdominal aortic aneurysm) ([place])    followed by Dr Oneida Alar. Last u/s07/10 stable AAA with largest measurement 3.97 x 4.02cm.  . Allergic rhinitis, seasonal   . Asthma   . Atrial fibrillation ([place])   . CAD (coronary artery disease) 07/2009   mod by cath 08/10  . Complication of anesthesia   . Depression   . Disc disease, degenerative, lumbar or lumbosacral    SPINAL STENOSIS  . Diverticulosis   . GERD (gastroesophageal reflux disease)   . Hemorrhoids   . History of recurrent UTIs   . Hyperlipidemia   . IBS (irritable bowel syndrome)   . IC (irritable colon)    DMSO in past  . Interstitial cystitis   . Osteoarthritis    spine/spinal stenosis  . Osteopenia 2011  . PONV (postoperative nausea and vomiting)   . Skin cancer    basal cell cancer - face.    Patient Active Problem List   Diagnosis Date Noted  . Unilateral primary osteoarthritis, left knee 07/11/2017  . Hyperlipidemia 03/12/2017  . Frequent UTI 02/06/2016  . Hypotension 01/09/2016  . Slurred speech 06/01/2015  . Carotid stenosis 06/01/2015  . Local reaction to immunization 02/21/2015  . Encounter for Medicare annual wellness exam 02/15/2015  .  Estrogen deficiency 02/15/2015  . Sinusitis, chronic 01/12/2015  . IBS (irritable bowel syndrome) 01/12/2015  . Atypical chest pain 01/11/2015  . Dizziness   . Pulsatile tinnitus of left ear 07/08/2014  . Palpitations 05/13/2014  . Diarrhea 12/23/2013  . Gastritis 10/28/2013  . Second degree heart block 10/18/2013  . Syncope 10/17/2013  . Abdominal aneurysm without mention of rupture 06/25/2013  . Osteopenia 01/13/2013  . CAD (coronary artery disease)   . Chronic depression 06/03/2007  . GERD 06/02/2007  . Asthma   . Chronic  interstitial cystitis   . Osteoarthritis   . Spinal stenosis of lumbar region   . History of urinary tract infection     Past Surgical History:  Procedure Laterality Date  . ABDOMINAL AORTIC ENDOVASCULAR STENT GRAFT N/A 07/01/2013   Procedure: ABDOMINAL AORTIC ENDOVASCULAR STENT GRAFT- GORE;  Surgeon: Elam Dutch, MD;  Location: [facility];  Service: Vascular;  Laterality: N/A;  Ultrasound guided  . ABDOMINAL HYSTERECTOMY     partial not cancer  . BREAST SURGERY     lumpectomy  . CYSTOCELE REPAIR    . ESOPHAGOGASTRODUODENOSCOPY  09/1998   erosive esphagitis ? stricture treated 12/2002  . HERNIA REPAIR  05/2375   umbilical hernia  . RECTOCELE REPAIR    . REFRACTIVE SURGERY     for film after cataract surgery.  Marland Kitchen RIGHT COLECTOMY  1997   benign  . SKIN LESION EXCISION  05/2011   basal cell skin lesion removed.    Prior to Admission medications   Medication Sig Start Date End Date Taking? Authorizing Provider  acetaminophen (TYLENOL) 500 MG tablet Take 500 mg by mouth every 4 (four) hours as needed for mild pain or moderate pain. For pain   Yes [provider]  albuterol (PROAIR HFA) 108 (90 Base) MCG/ACT inhaler Inhale 2 puffs into the lungs every 4 (four) hours as needed for wheezing. 03/07/17  Yes Tower, Wynelle Fanny, MD  cetirizine (ZYRTEC) 10 MG tablet Take 10 mg by mouth daily. For allergies   Yes [provider]  clopidogrel (PLAVIX) 75 MG tablet Take one tablet by mouth daily. 09/04/16  Yes Sater, Nanine Means, MD  esomeprazole (NEXIUM) 40 MG capsule Take 1 capsule (40 mg total) by mouth 2 (two) times daily before a meal. Patient taking differently: Take 40 mg by mouth 2 (two) times daily as needed.  09/16/16  Yes Tower, Wynelle Fanny, MD  fluticasone (FLONASE) 50 MCG/ACT nasal spray Place 2 sprays into both nostrils daily. 03/07/17  Yes Tower, Wynelle Fanny, MD  latanoprost (XALATAN) 0.005 % ophthalmic solution Place 1 drop into both eyes at bedtime.     Yes [provider]   Polyethyl Glycol-Propyl Glycol (SYSTANE OP) Place 1 drop into both eyes as needed (dry eyes).    Yes [provider]  polyethylene glycol (MIRALAX / GLYCOLAX) packet Take 17 g by mouth daily as needed. For constipation   Yes [provider]  ranitidine (ZANTAC) 300 MG tablet Take 1 tablet (300 mg total) by mouth at bedtime. 11/16/15  Yes Kozlow, Donnamarie Poag, MD  trimethoprim (TRIMPEX) 100 MG tablet Take 1 tablet (100 mg total) by mouth 2 (two) times daily. Patient taking differently: Take 100 mg by mouth daily.  03/07/17  Yes Tower, Wynelle Fanny, MD  beclomethasone (QVAR) 80 MCG/ACT inhaler Inhale 1 puff into the lungs 2 (two) times daily. Patient not taking: Reported on 09/05/2017 08/12/13   Abner Greenspan, MD    Allergies Amoxicillin-pot clavulanate; Avelox [  moxifloxacin hcl in nacl]; Ibuprofen; Keflex [cephalexin]; Nitrofurantoin; Omeprazole; Prevnar [pneumococcal 13-val conj vacc]; Rosuvastatin; Sertraline hcl; and Tetanus toxoids  Family History  Problem Relation Age of Onset  . Heart disease Mother        deceased age 67 heart attack.  Marland Kitchen Heart attack Mother   . Other Mother        varicose veins  . Varicose Veins Mother   . Heart disease Father        age 33 heart attack  . Cancer Father   . Stroke Brother   . Cancer Brother   . Heart disease Brother   . Heart attack Brother   . Cancer Daughter   . Diabetes Son     Social History Social History  Substance Use Topics  . Smoking status: Never Smoker  . Smokeless tobacco: Never Used  . Alcohol use No    Review of Systems  Constitutional: No fever/chills Eyes: No visual changes. ENT: No sore throat. Cardiovascular: as above Respiratory: cough.   Gastrointestinal:  No nausea, no vomiting.  No diarrhea.  No constipation. Genitourinary: Negative for dysuria. Musculoskeletal: Negative for back pain. Skin: Negative for rash. Neurological: Negative for focal weakness or  numbness.   ____________________________________________   PHYSICAL EXAM:  VITAL SIGNS: ED Triage Vitals  Enc Vitals Group     BP 09/05/17 1554 124/76     Pulse Rate 09/05/17 1554 87     Resp 09/05/17 1554 18     Temp 09/05/17 1554 98.2 F (36.8 C)     Temp Source 09/05/17 1554 Oral     SpO2 09/05/17 1554 99 %     Weight 09/05/17 1554 168 lb (76.2 kg)     Height 09/05/17 1554 5' 6.5" (1.689 m)     Head Circumference --      Peak Flow --      Pain Score 09/05/17 1553 0     Pain Loc --      Pain Edu? --      Excl. in [place]? --     Constitutional: Alert and oriented. Well appearing and in no acute distress. Eyes: Conjunctivae are normal.  Head: Atraumatic. Nose: No congestion/rhinnorhea. Mouth/Throat: Mucous membranes are moist.  Neck: No stridor.   Cardiovascular: Normal rate, regular rhythm. Grossly normal heart sounds.  Good peripheral circulation with equal and bilateral radial as well as dorsalis pedis pulses.  Respiratory: Normal respiratory effort.  No retractions. Lungs CTAB. Gastrointestinal: Soft and nontender. No distention. No CVA tenderness. Musculoskeletal: No lower extremity tenderness nor edema.  No joint effusions.no tenderness to palpation or deformity to the lumbar region. Neurologic:  Normal speech and language. No gross focal neurologic deficits are appreciated. Skin:  Skin is warm, dry and intact. No rash noted. Psychiatric: Mood and affect are normal. Speech and behavior are normal.  ____________________________________________   LABS (all labs ordered are listed, but only abnormal results are displayed)  Labs Reviewed  CBC WITH DIFFERENTIAL/PLATELET - Abnormal; Notable for the following:       Result Value   Neutro Abs 7.1 (*)    Monocytes Absolute 1.0 (*)    All other components within normal limits  BASIC METABOLIC PANEL - Abnormal; Notable for the following:    Glucose, Bld 116 (*)    GFR calc non Af Amer 54 (*)    All other components  within normal limits  URINALYSIS, COMPLETE (UACMP) WITH MICROSCOPIC - Abnormal; Notable for the following:    Color, Urine STRAW (*)  APPearance CLEAR (*)    Specific Gravity, Urine 1.004 (*)    Hgb urine dipstick MODERATE (*)    Squamous Epithelial / LPF 0-5 (*)    All other components within normal limits  TROPONIN I   ____________________________________________  EKG  ED ECG REPORT I, [place],  Youlanda Roys, the attending physician, personally viewed and interpreted this ECG.   Date: 09/05/2017  EKG Time: 1612  Rate: 87  Rhythm: normal sinus rhythmversus atrial flutter. EKG machine reads as flutter. However, there. Clear P waves before each QRS. Possible ectopic P waves in V3 that are non-transmitting versus flutter waves.  Axis: normal  Intervals:none  ST&T Change: no ST segment elevation or depression. No abnormal T-wave inversion.  ____________________________________________  RADIOLOGY  no acute disease ____________________________________________   PROCEDURES  Procedure(s) performed:   Procedures  Critical Care performed:   ____________________________________________   INITIAL IMPRESSION / ASSESSMENT AND PLAN / ED COURSE  Pertinent labs & imaging results that were available during my care of the patient were reviewed by me and considered in my medical decision making (see chart for details).  DDX: AAA, aortic dissection, ACS, UTI, dehydration, near syncope, URI    ----------------------------------------- 7:45 PM on 09/05/2017 -----------------------------------------  Patient at this time with reassuring vital signs within normal limits. Heart rate on reexamination was in the 90s. Patient without any complaint at this time. No chest pain. Says that her chest pain complaints from earlier today of been ongoing and this is why she received ultrasound to examine her aorta about one week ago. Unlikely to be aortic dissection or ruptured aortic aneurysm. The  patient has been improving throughout her stay. She has no complaints at this time. Symptoms seem to have resolved if the patient was able to eat and drink this morning. Patient will be discharged home.patient is saying that the chest pain is worsening with movement. Likely musculoskeletal.  Reassuring cardiac labs.  ____________________________________________   FINAL CLINICAL IMPRESSION(S) / ED DIAGNOSES  near syncope. Chest pain.    NEW MEDICATIONS STARTED DURING THIS VISIT:  New Prescriptions   No medications on file     Note:  This document was prepared using Dragon voice recognition software and may include unintentional dictation errors.     Orbie Pyo, MD 09/05/17 [id number]

## 2017-09-05 NOTE — ED Notes (Signed)
Pt. Verbalizes understanding of d/c instructions and follow-up. VS stable and pain controlled per pt.  Pt. In NAD at time of d/c and denies further concerns regarding this visit. Pt. Stable at the time of departure from the unit, departing unit by the safest and most appropriate manner per that pt condition and limitations. Pt advised to return to the ED at any time for emergent concerns, or for new/worsening symptoms.   

## 2017-09-05 NOTE — ED Triage Notes (Addendum)
Pt brought over from [facility] for reports of low BP, elevated heart rate, and shortness of breath. Pt daughter reports low blood pressure this morning, lightheadedness. Pt reports having a lot of phlegm in her throat for several weeks. Pt speaking in complete sentences without difficulty. Denies pain. Pt reports history of low back pain and spinal stenosis. Pt alert and oriented.

## 2017-09-10 ENCOUNTER — Ambulatory Visit (INDEPENDENT_AMBULATORY_CARE_PROVIDER_SITE_OTHER): Payer: Medicare Other | Admitting: Allergy and Immunology

## 2017-09-10 ENCOUNTER — Encounter: Payer: Self-pay | Admitting: Allergy and Immunology

## 2017-09-10 VITALS — BP 118/74 | HR 96 | Resp 17 | Wt 174.8 lb

## 2017-09-10 DIAGNOSIS — J4521 Mild intermittent asthma with (acute) exacerbation: Secondary | ICD-10-CM

## 2017-09-10 DIAGNOSIS — J3089 Other allergic rhinitis: Secondary | ICD-10-CM

## 2017-09-10 DIAGNOSIS — K219 Gastro-esophageal reflux disease without esophagitis: Secondary | ICD-10-CM | POA: Diagnosis not present

## 2017-09-10 MED ORDER — RANITIDINE HCL 300 MG PO TABS: 300.0000 mg | ORAL_TABLET | Freq: Every day | ORAL | 5 refills | Status: DC

## 2017-09-10 MED ORDER — CETIRIZINE HCL 10 MG PO TABS: 10.0000 mg | ORAL_TABLET | Freq: Every day | ORAL | 5 refills | Status: DC

## 2017-09-10 MED ORDER — BECLOMETHASONE DIPROP HFA 80 MCG/ACT IN AERB: 2.0000 | INHALATION_SPRAY | Freq: Two times a day (BID) | RESPIRATORY_TRACT | 5 refills | Status: DC

## 2017-09-10 MED ORDER — ALBUTEROL SULFATE HFA 108 (90 BASE) MCG/ACT IN AERS: 2.0000 | INHALATION_SPRAY | RESPIRATORY_TRACT | 2 refills | Status: DC | PRN

## 2017-09-10 MED ORDER — METHYLPREDNISOLONE ACETATE 80 MG/ML IJ SUSP
80.0000 mg | Freq: Once | INTRAMUSCULAR | Status: AC
  Administered 2017-09-10: 80 mg via INTRAMUSCULAR

## 2017-09-10 NOTE — Progress Notes (Signed)
Follow-up Note  Referring Provider: Abner Greenspan, MD Primary Provider: Abner Greenspan, MD Date of Office Visit: 09/10/2017  Subjective:   Ashlee Reyes (DOB: 1921/04/20) is a 83 y.o. female who returns to the [facility] [place] on 09/10/2017 in re-evaluation of the following:  HPI: Ashlee Reyes presents to this clinic in evaluation of respiratory tract symptoms that have been of long-standing nature but have changed over the course of the past week. Her last visit to this clinic was February 2016. At that point in time she appeared to have issues with asthma and allergic rhinitis and reflux-induced respiratory disease and a reflux-induced chest pain syndrome.  She did very well without the use of much anti-inflammatory medications except for an occasional nasal steroid but unfortunately over the course the past week she has developed coughing and chest tightness and shortness of breath and phlegm in her throat as well as runny nose without any anosmia or ugly nasal discharge or recurrent fever or chest pain or ugly sputum production. She did have a headache last week and she just felt bad in general and went to the emergency room on Thursday and apparently was given 2 bags of fluid and had a chest x-ray which was normal and blood tests which were normal.  She still continues to use therapy directed against reflux utilizing Nexium on a daily basis and occasionally needs to add a ranitidine at nighttime. She still has lots of throat clearing on a persistent basis over the course of the past several years but this definitely does respond somewhat to the use of therapy directed against reflux.  Allergies as of 09/10/2017      Reactions   Amoxicillin-pot Clavulanate    REACTION: sick   Avelox [moxifloxacin Hcl In Nacl] Nausea Only   Ibuprofen    REACTION: GI   Keflex [cephalexin]    abd pain / GI upset   Nitrofurantoin Other (See Comments)   Chest pain or indigestion with  nausea   Omeprazole Nausea Only   Prevnar [pneumococcal 13-val Conj Vacc] Itching   Severe local reaction with redness and itching    Rosuvastatin    REACTION: foot pain   Sertraline Hcl    REACTION: more depressed   Tetanus Toxoids    Local redness and swelling       Medication List      acetaminophen 500 MG tablet Commonly known as:  TYLENOL Take 500 mg by mouth every 4 (four) hours as needed for mild pain or moderate pain. For pain   albuterol 108 (90 Base) MCG/ACT inhaler Commonly known as:  PROAIR HFA Inhale 2 puffs into the lungs every 4 (four) hours as needed for wheezing.   cetirizine 10 MG tablet Commonly known as:  ZYRTEC Take 10 mg by mouth daily. For allergies   clopidogrel 75 MG tablet Commonly known as:  PLAVIX Take one tablet by mouth daily.   esomeprazole 40 MG capsule Commonly known as:  NEXIUM Take 1 capsule (40 mg total) by mouth 2 (two) times daily before a meal.   fluticasone 50 MCG/ACT nasal spray Commonly known as:  FLONASE Place 2 sprays into both nostrils daily.   latanoprost 0.005 % ophthalmic solution Commonly known as:  XALATAN Place 1 drop into both eyes at bedtime.   polyethylene glycol packet Commonly known as:  MIRALAX / GLYCOLAX Take 17 g by mouth daily as needed. For constipation   ranitidine 300 MG tablet Commonly known as:  ZANTAC Take 1 tablet (300 mg total) by mouth at bedtime.   SYSTANE OP Place 1 drop into both eyes as needed (dry eyes).   trimethoprim 100 MG tablet Commonly known as:  TRIMPEX Take 1 tablet (100 mg total) by mouth 2 (two) times daily.       Past Medical History:  Diagnosis Date  . AAA (abdominal aortic aneurysm) ([place])    followed by Dr Oneida Alar. Last u/s07/10 stable AAA with largest measurement 3.97 x 4.02cm.  . Allergic rhinitis, seasonal   . Asthma   . Atrial fibrillation ([place])   . CAD (coronary artery disease) 07/2009   mod by cath 08/10  . Complication of anesthesia   . Depression   .  Disc disease, degenerative, lumbar or lumbosacral    SPINAL STENOSIS  . Diverticulosis   . GERD (gastroesophageal reflux disease)   . Hemorrhoids   . History of recurrent UTIs   . Hyperlipidemia   . IBS (irritable bowel syndrome)   . IC (irritable colon)    DMSO in past  . Interstitial cystitis   . Osteoarthritis    spine/spinal stenosis  . Osteopenia 2011  . PONV (postoperative nausea and vomiting)   . Skin cancer    basal cell cancer - face.    Past Surgical History:  Procedure Laterality Date  . ABDOMINAL AORTIC ENDOVASCULAR STENT GRAFT N/A 07/01/2013   Procedure: ABDOMINAL AORTIC ENDOVASCULAR STENT GRAFT- GORE;  Surgeon: Elam Dutch, MD;  Location: [facility];  Service: Vascular;  Laterality: N/A;  Ultrasound guided  . ABDOMINAL HYSTERECTOMY     partial not cancer  . BREAST SURGERY     lumpectomy  . CYSTOCELE REPAIR    . ESOPHAGOGASTRODUODENOSCOPY  09/1998   erosive esphagitis ? stricture treated 12/2002  . HERNIA REPAIR  16/1096   umbilical hernia  . RECTOCELE REPAIR    . REFRACTIVE SURGERY     for film after cataract surgery.  Marland Kitchen RIGHT COLECTOMY  1997   benign  . SKIN LESION EXCISION  05/2011   basal cell skin lesion removed.    Review of systems negative except as noted in HPI / PMHx or noted below:  Review of Systems  Constitutional: Negative.   HENT: Negative.   Eyes: Negative.   Respiratory: Negative.   Cardiovascular: Negative.   Gastrointestinal: Negative.   Genitourinary: Negative.   Musculoskeletal: Negative.   Skin: Negative.   Neurological: Negative.   Endo/Heme/Allergies: Negative.   Psychiatric/Behavioral: Negative.      Objective:   Vitals:   09/10/17 1435  BP: 118/74  Pulse: 96  Resp: 17  SpO2: 99%      Weight: 174 lb 12.8 oz (79.3 kg)   Physical Exam  Constitutional: She is well-developed, well-nourished, and in no distress.  Coughing  HENT:  Head: Normocephalic.  Right Ear: Tympanic membrane, external ear and ear canal  normal.  Left Ear: Tympanic membrane, external ear and ear canal normal.  Nose: Mucosal edema (erythematous) present. No rhinorrhea.  Mouth/Throat: Uvula is midline, oropharynx is clear and moist and mucous membranes are normal. No oropharyngeal exudate.  Eyes: Conjunctivae are normal.  Neck: Trachea normal. No tracheal tenderness present. No tracheal deviation present. No thyromegaly present.  Cardiovascular: Normal rate, S1 normal, S2 normal and normal heart sounds.   No murmur heard. Pulmonary/Chest: Breath sounds normal. No stridor. No respiratory distress. She has no wheezes. She has no rales.  Musculoskeletal: She exhibits no edema.  Lymphadenopathy:       Head (  right side): No tonsillar adenopathy present.       Head (left side): No tonsillar adenopathy present.    She has no cervical adenopathy.  Neurological: She is alert. Gait normal.  Skin: No rash noted. She is not diaphoretic. No erythema. Nails show no clubbing.  Psychiatric: Mood and affect normal.    Diagnostics:    Spirometry was performed and demonstrated an FEV1 of 1.05 at 62 % of predicted.  The patient had an Asthma Control Test with the following results: ACT Total Score: 7.    Results of a chest x-ray obtained 09/05/2017 identify the following:  Stable cardiomegaly and mild hyperinflation. Suspect COPD/emphysema. No superimposed pneumonia, collapse or consolidation. Negative for edema, effusion or pneumothorax. Atherosclerosis of the aorta. Degenerative changes of the spine.  Results of blood test obtained 09/05/2017 identified a white blood cell count 10.2 with an absolute eosinophil count 100, absolute lymphocyte count 1900, hemoglobin 13.5, platelet 207  Assessment and Plan:   1. Asthma, not well controlled, mild intermittent, with acute exacerbation   2. Other allergic rhinitis   3. LPRD (laryngopharyngeal reflux disease)     1. Ipratropium and Xopenex nebulization delivered in clinic today  2.  Depo-Medrol 80 IM delivered in clinic today  3. While "sick" utilize the following:   A. nasal saline multiple times a day  B. Mucinex DM 1-2 tablets twice a day  C. cetirizine 10 mg tablet 1 time per day  D. Qvar 80 Redihaler - 2 inhalations twice a day  4. If needed: ProAir HFA or similar 2 inhalations every 4-6 hours  5. Continue therapy for reflux including the following:   A. Nexium 40 mg tablet in a.m.  B. ranitidine 300 mg tablet in PM if needed  6. Continue Flonase one-2 sprays each nostril one time per day during periods of upper airway symptoms  7. Further evaluation and treatment? Contact clinic this week concerning response  8. Obtain flu vaccine when better  I will assume that Belenda Cruise will do well with the medical therapy noted above directed against inflammation of her respiratory tract most likely triggered off by a viral respiratory tract infection. If for some reason she does not do well her daughter will contact me for further evaluation and treatment. She will continue to use therapy directed against reflux as noted above. When she gets over this respiratory tract flare up she can stop her Qvar and we will assume that she will do relatively well regarding her asthma without a specific controller agent. Of course, if she develops recurrent problems in the face of this approach her plan will need to be altered. If she does well I will see her in his clinic in 1 year.  Allena Katz, MD Allergy / Immunology [place]

## 2017-09-10 NOTE — Patient Instructions (Addendum)
  1. Ipratropium and Xopenex nebulization delivered in clinic today  2. Depo-Medrol 80 IM delivered in clinic today  3. While "sick" utilize the following:   A. nasal saline multiple times a day  B. Mucinex DM 1-2 tablets twice a day  C. cetirizine 10 mg tablet 1 time per day  D. Qvar 80 Redihaler - 2 inhalations twice a day  4. If needed: ProAir HFA or similar 2 inhalations every 4-6 hours  5. Continue therapy for reflux including the following:   A. Nexium 40 mg tablet in a.m.  B. ranitidine 300 mg tablet in PM if needed  6. Continue Flonase one-2 sprays each nostril one time per day during periods of upper airway symptoms  7. Further evaluation and treatment? Contact clinic this week concerning response  8. Obtain flu vaccine when better

## 2017-09-13 ENCOUNTER — Telehealth: Payer: Self-pay | Admitting: Allergy and Immunology

## 2017-09-13 ENCOUNTER — Other Ambulatory Visit: Payer: Self-pay | Admitting: Neurology

## 2017-09-13 NOTE — Telephone Encounter (Signed)
Pt sister called and said that the [employer] required a auth. And she has not heard anything. Also she is still having trouble and wants to know if she can take Nexium 2 x a day. [name] [phone]

## 2017-09-13 NOTE — Telephone Encounter (Signed)
[id number]. Approval number. I have also spoken with patient's sister in regards to the Nexium increase. I advised her that it would be okay to up the dose to 2 times daily, but only temporarily.

## 2017-09-13 NOTE — Telephone Encounter (Signed)
Ashlee Reyes has been approved.

## 2017-09-17 ENCOUNTER — Other Ambulatory Visit: Payer: Self-pay | Admitting: Family Medicine

## 2017-09-17 ENCOUNTER — Other Ambulatory Visit: Payer: Self-pay | Admitting: Allergy

## 2017-09-17 NOTE — Telephone Encounter (Signed)
Please refill for a year  

## 2017-09-17 NOTE — Telephone Encounter (Signed)
done

## 2017-09-17 NOTE — Telephone Encounter (Signed)
You didn't fill Rx last time and pt hasn't seen you since March, please advise

## 2017-10-07 DIAGNOSIS — H01001 Unspecified blepharitis right upper eyelid: Secondary | ICD-10-CM | POA: Diagnosis not present

## 2017-10-07 DIAGNOSIS — H353132 Nonexudative age-related macular degeneration, bilateral, intermediate dry stage: Secondary | ICD-10-CM | POA: Diagnosis not present

## 2017-10-07 DIAGNOSIS — H401133 Primary open-angle glaucoma, bilateral, severe stage: Secondary | ICD-10-CM | POA: Diagnosis not present

## 2017-10-07 DIAGNOSIS — H52203 Unspecified astigmatism, bilateral: Secondary | ICD-10-CM | POA: Diagnosis not present

## 2017-10-10 DIAGNOSIS — H353132 Nonexudative age-related macular degeneration, bilateral, intermediate dry stage: Secondary | ICD-10-CM | POA: Diagnosis not present

## 2017-10-11 ENCOUNTER — Other Ambulatory Visit: Payer: Self-pay | Admitting: Family Medicine

## 2017-11-04 DIAGNOSIS — H401133 Primary open-angle glaucoma, bilateral, severe stage: Secondary | ICD-10-CM | POA: Diagnosis not present

## 2017-11-19 DIAGNOSIS — J301 Allergic rhinitis due to pollen: Secondary | ICD-10-CM | POA: Diagnosis not present

## 2017-11-19 DIAGNOSIS — R1312 Dysphagia, oropharyngeal phase: Secondary | ICD-10-CM | POA: Diagnosis not present

## 2017-11-19 DIAGNOSIS — D105 Benign neoplasm of other parts of oropharynx: Secondary | ICD-10-CM | POA: Diagnosis not present

## 2017-11-22 ENCOUNTER — Other Ambulatory Visit: Payer: Self-pay | Admitting: Otolaryngology

## 2017-11-22 DIAGNOSIS — R1312 Dysphagia, oropharyngeal phase: Secondary | ICD-10-CM

## 2017-12-13 ENCOUNTER — Ambulatory Visit
Admission: RE | Admit: 2017-12-13 | Discharge: 2017-12-13 | Disposition: A | Discharge status: Home / Self Care | Payer: Medicare Other | Source: Ambulatory Visit | Attending: Otolaryngology | Admitting: Otolaryngology

## 2017-12-13 DIAGNOSIS — R1310 Dysphagia, unspecified: Secondary | ICD-10-CM | POA: Diagnosis not present

## 2017-12-13 DIAGNOSIS — T17308A Unspecified foreign body in larynx causing other injury, initial encounter: Secondary | ICD-10-CM | POA: Diagnosis not present

## 2017-12-13 DIAGNOSIS — R1312 Dysphagia, oropharyngeal phase: Secondary | ICD-10-CM

## 2017-12-13 NOTE — Therapy (Signed)
[place], [place], [address] Phone: [phone]   Fax:     Modified Barium Swallow  Patient Details  Name: Ashlee Reyes MRN: [id number] Date of Birth: 31-May-1921 No Data Recorded  Encounter Date: 12/13/2017  End of Session - 12/13/17 1356    Visit Number  1    Number of Visits  1    Date for SLP Re-Evaluation  12/13/17    SLP Start Time  1247    SLP Stop Time   1347    SLP Time Calculation (min)  60 min    Activity Tolerance  Patient tolerated treatment well       Past Medical History:  Diagnosis Date  . AAA (abdominal aortic aneurysm) ([place])    followed by Dr Oneida Alar. Last u/s07/10 stable AAA with largest measurement 3.97 x 4.02cm.  . Allergic rhinitis, seasonal   . Asthma   . Atrial fibrillation ([place])   . CAD (coronary artery disease) 07/2009   mod by cath 08/10  . Complication of anesthesia   . Depression   . Disc disease, degenerative, lumbar or lumbosacral    SPINAL STENOSIS  . Diverticulosis   . GERD (gastroesophageal reflux disease)   . Hemorrhoids   . History of recurrent UTIs   . Hyperlipidemia   . IBS (irritable bowel syndrome)   . IC (irritable colon)    DMSO in past  . Interstitial cystitis   . Osteoarthritis    spine/spinal stenosis  . Osteopenia 2011  . PONV (postoperative nausea and vomiting)   . Skin cancer    basal cell cancer - face.    Past Surgical History:  Procedure Laterality Date  . ABDOMINAL AORTIC ENDOVASCULAR STENT GRAFT N/A 07/01/2013   Procedure: ABDOMINAL AORTIC ENDOVASCULAR STENT GRAFT- GORE;  Surgeon: Elam Dutch, MD;  Location: [facility];  Service: Vascular;  Laterality: N/A;  Ultrasound guided  . ABDOMINAL HYSTERECTOMY     partial not cancer  . BREAST SURGERY     lumpectomy  . CYSTOCELE REPAIR    . ESOPHAGOGASTRODUODENOSCOPY  09/1998   erosive esphagitis ? stricture treated 12/2002  . HERNIA REPAIR  37/6283   umbilical hernia   . RECTOCELE REPAIR    . REFRACTIVE SURGERY     for film after cataract surgery.  Marland Kitchen RIGHT COLECTOMY  1997   benign  . SKIN LESION EXCISION  05/2011   basal cell skin lesion removed.    There were no vitals filed for this visit.   Subjective: Patient behavior: (alertness, ability to follow instructions, etc.): patient is alert but confused.  Her daughter provides her swallowing history. Chief complaint: difficulty swallowing dry foods and pills, choking with liquids   Objective:  Radiological Procedure: A videoflouroscopic evaluation of oral-preparatory, reflex initiation, and pharyngeal phases of the swallow was performed; as well as a screening of the upper esophageal phase.  I. POSTURE: Upright in MBS chair  II. VIEW: Lateral  III. COMPENSATORY STRATEGIES: N/A  IV. BOLUSES ADMINISTERED:   Thin Liquid: 2 small, 4 rapid, consecutive   Nectar-thick Liquid: 1 moderate   Honey-thick Liquid: DNT   Puree: 2 teaspoon presentations   Mechanical Soft: 1/4 graham cracker in applesauce   Barium tablet  V. RESULTS OF EVALUATION: A. ORAL PREPARATORY PHASE: (The lips, tongue, and velum are observed for strength and coordination)       **Overall Severity Rating: Within normal limits  B. SWALLOW INITIATION/REFLEX: (The reflex is normal  if "triggered" by the time the bolus reached the base of the tongue)  **Overall Severity Rating: Mild; triggers while falling from the valleculae to the pyriform sinuses  C. PHARYNGEAL PHASE: (Pharyngeal function is normal if the bolus shows rapid, smooth, and continuous transit through the pharynx and there is no pharyngeal residue after the swallow)  **Overall Severity Rating: Mild; decreased hyolaryngeal excursion, decreased tongue base retraction, and decreased epiglottic inversion.  There is mild vallecular residue with coating along the posterior pharyngeal wall.     D. LARYNGEAL PENETRATION: (Material entering into the laryngeal inlet/vestibule  but not aspirated) Transient; during rapid consecutive drinking  E. ASPIRATION: None  F. ESOPHAGEAL PHASE: (Screening of the upper esophagus) Once through the UES, the barium tablet moved quickly through the cervical esophagus and out of view.    ASSESSMENT: This 83 year old woman; with complaint of difficulty swallowing dry food / pills and choking with liquids; is presenting with mild oropharyngeal dysphagia characterized by delayed pharyngeal swallow initiation, decreased hyolaryngeal excursion, decreased tongue base retraction, and decreased epiglottic inversion.  There is mild vallecular residue with coating along the posterior pharyngeal wall.   Oral control of the bolus including oral hold, rotary mastication, and anterior to posterior transfer is within normal limits. There is transient laryngeal penetration with rapid consecutive drinking of thin liquid (considered within normal limits for age).  There is no observed tracheal aspiration.  The patient required a cup of water and 9 swallows to clear a barium tablet through the pharynx. Once through the UES, the tablet moved quickly through the cervical esophagus and out of view.  The patient's difficulty with pills and dry foods appears to be aggravated by xerostomia and she should have an easier time with moistening foods and taking pills in a soft/moist substrate, such as applesauce.  The patient's daughter reports improvement in swallowing liquids since she has been taking her rhinitis medication properly.  PLAN/RECOMMENDATIONS:   A. Diet:Regular, soften and moisten as needed   B. Swallowing Precautions: Try pills whole in applesauce    C. Recommended consultation to: N/A   D. Therapy recommendations: not indicated at this time   E. Results and recommendations were discussed with the patient and her daughter immediately following the study and the final report routed to the referring MD.   Oropharyngeal dysphagia - Plan: DG OP  Swallowing Func-Medicare/Speech Path, DG OP Swallowing Func-Medicare/Speech Path  G-Codes - 01/01/2018 1357    Functional Assessment Tool Used  MBSS, clinical judgment    Functional Limitations  Swallowing    Swallow Current Status (I9678)  At least 20 percent but less than 40 percent impaired, limited or restricted    Swallow Goal Status (L3810)  At least 20 percent but less than 40 percent impaired, limited or restricted    Swallow Discharge Status [phone])  At least 20 percent but less than 40 percent impaired, limited or restricted           Problem List Patient Active Problem List   Diagnosis Date Noted  . Unilateral primary osteoarthritis, left knee 07/11/2017  . Hyperlipidemia 03/12/2017  . Frequent UTI 02/06/2016  . Hypotension 01/09/2016  . Slurred speech 06/01/2015  . Carotid stenosis 06/01/2015  . Local reaction to immunization 02/21/2015  . Encounter for Medicare annual wellness exam 02/15/2015  . Estrogen deficiency 02/15/2015  . Sinusitis, chronic 01/12/2015  . IBS (irritable bowel syndrome) 01/12/2015  . Atypical chest pain 01/11/2015  . Dizziness   . Pulsatile tinnitus of left  ear 07/08/2014  . Palpitations 05/13/2014  . Diarrhea 12/23/2013  . Gastritis 10/28/2013  . Second degree heart block 10/18/2013  . Syncope 10/17/2013  . Abdominal aneurysm without mention of rupture 06/25/2013  . Osteopenia 01/13/2013  . CAD (coronary artery disease)   . Chronic depression 06/03/2007  . GERD 06/02/2007  . Asthma   . Chronic interstitial cystitis   . Osteoarthritis   . Spinal stenosis of lumbar region   . History of urinary tract infection    Leroy Sea, MS/CCC- SLP  Lou Miner 12/13/2017, 1:58 PM  [facility] [place], [place], [address] Phone: [phone]   Fax:     Name: Ashlee Reyes MRN: [id number] Date of Birth: 12-12-1921

## 2017-12-30 ENCOUNTER — Encounter: Payer: Self-pay | Admitting: Primary Care

## 2017-12-30 ENCOUNTER — Telehealth: Payer: Self-pay | Admitting: Family Medicine

## 2017-12-30 ENCOUNTER — Ambulatory Visit (INDEPENDENT_AMBULATORY_CARE_PROVIDER_SITE_OTHER): Payer: Medicare Other | Admitting: Primary Care

## 2017-12-30 ENCOUNTER — Ambulatory Visit: Payer: Self-pay | Admitting: *Deleted

## 2017-12-30 VITALS — BP 126/80 | HR 78 | Temp 97.9°F

## 2017-12-30 DIAGNOSIS — R319 Hematuria, unspecified: Secondary | ICD-10-CM | POA: Diagnosis not present

## 2017-12-30 DIAGNOSIS — M545 Low back pain, unspecified: Secondary | ICD-10-CM

## 2017-12-30 LAB — POC URINALSYSI DIPSTICK (AUTOMATED)
Bilirubin, UA: NEGATIVE
GLUCOSE UA: NEGATIVE
Ketones, UA: NEGATIVE
Leukocytes, UA: NEGATIVE
NITRITE UA: NEGATIVE
PH UA: 6 (ref 5.0–8.0)
PROTEIN UA: NEGATIVE
SPEC GRAV UA: 1.01 (ref 1.010–1.025)
UROBILINOGEN UA: 0.2 U/dL

## 2017-12-30 MED ORDER — TIZANIDINE HCL 4 MG PO TABS: 4.0000 mg | ORAL_TABLET | Freq: Three times a day (TID) | ORAL | 0 refills | Status: DC | PRN

## 2017-12-30 NOTE — Patient Instructions (Signed)
Start Tizanidine 4 mg tablets for muscle spasm and back pain. You may take this every 8 hours as needed, caution as this may cause drowsiness.  Remember to get up and walk every hour to prevent further stiffness.  You may continue tylenol as needed.  Apply a heating pad three times daily for 30 minute intervals.  Please notify us if no improvement in 3-4 days.  It was a pleasure to see you today!   Back Exercises The following exercises strengthen the muscles that help to support the back. They also help to keep the lower back flexible. Doing these exercises can help to prevent back pain or lessen existing pain. If you have back pain or discomfort, try doing these exercises 2-3 times each day or as told by your health care provider. When the pain goes away, do them once each day, but increase the number of times that you repeat the steps for each exercise (do more repetitions). If you do not have back pain or discomfort, do these exercises once each day or as told by your health care provider. Exercises Single Knee to Chest  Repeat these steps 3-5 times for each leg: 1. Lie on your back on a firm bed or the floor with your legs extended. 2. Bring one knee to your chest. Your other leg should stay extended and in contact with the floor. 3. Hold your knee in place by grabbing your knee or thigh. 4. Pull on your knee until you feel a gentle stretch in your lower back. 5. Hold the stretch for 10-30 seconds. 6. Slowly release and straighten your leg.  Pelvic Tilt  Repeat these steps 5-10 times: 1. Lie on your back on a firm bed or the floor with your legs extended. 2. Bend your knees so they are pointing toward the ceiling and your feet are flat on the floor. 3. Tighten your lower abdominal muscles to press your lower back against the floor. This motion will tilt your pelvis so your tailbone points up toward the ceiling instead of pointing to your feet or the floor. 4. With gentle tension  and even breathing, hold this position for 5-10 seconds.  Cat-Cow  Repeat these steps until your lower back becomes more flexible: 1. Get into a hands-and-knees position on a firm surface. Keep your hands under your shoulders, and keep your knees under your hips. You may place padding under your knees for comfort. 2. Let your head hang down, and point your tailbone toward the floor so your lower back becomes rounded like the back of a cat. 3. Hold this position for 5 seconds. 4. Slowly lift your head and point your tailbone up toward the ceiling so your back forms a sagging arch like the back of a cow. 5. Hold this position for 5 seconds.  Press-Ups  Repeat these steps 5-10 times: 1. Lie on your abdomen (face-down) on the floor. 2. Place your palms near your head, about shoulder-width apart. 3. While you keep your back as relaxed as possible and keep your hips on the floor, slowly straighten your arms to raise the top half of your body and lift your shoulders. Do not use your back muscles to raise your upper torso. You may adjust the placement of your hands to make yourself more comfortable. 4. Hold this position for 5 seconds while you keep your back relaxed. 5. Slowly return to lying flat on the floor.  Bridges  Repeat these steps 10 times: 1. Lie on  your back on a firm surface. 2. Bend your knees so they are pointing toward the ceiling and your feet are flat on the floor. 3. Tighten your buttocks muscles and lift your buttocks off of the floor until your waist is at almost the same height as your knees. You should feel the muscles working in your buttocks and the back of your thighs. If you do not feel these muscles, slide your feet 1-2 inches farther away from your buttocks. 4. Hold this position for 3-5 seconds. 5. Slowly lower your hips to the starting position, and allow your buttocks muscles to relax completely.  If this exercise is too easy, try doing it with your arms crossed  over your chest. Abdominal Crunches  Repeat these steps 5-10 times: 1. Lie on your back on a firm bed or the floor with your legs extended. 2. Bend your knees so they are pointing toward the ceiling and your feet are flat on the floor. 3. Cross your arms over your chest. 4. Tip your chin slightly toward your chest without bending your neck. 5. Tighten your abdominal muscles and slowly raise your trunk (torso) high enough to lift your shoulder blades a tiny bit off of the floor. Avoid raising your torso higher than that, because it can put too much stress on your low back and it does not help to strengthen your abdominal muscles. 6. Slowly return to your starting position.  Back Lifts Repeat these steps 5-10 times: 1. Lie on your abdomen (face-down) with your arms at your sides, and rest your forehead on the floor. 2. Tighten the muscles in your legs and your buttocks. 3. Slowly lift your chest off of the floor while you keep your hips pressed to the floor. Keep the back of your head in line with the curve in your back. Your eyes should be looking at the floor. 4. Hold this position for 3-5 seconds. 5. Slowly return to your starting position.  Contact a health care provider if:  Your back pain or discomfort gets much worse when you do an exercise.  Your back pain or discomfort does not lessen within 2 hours after you exercise. If you have any of these problems, stop doing these exercises right away. Do not do them again unless your health care provider says that you can. Get help right away if:  You develop sudden, severe back pain. If this happens, stop doing the exercises right away. Do not do them again unless your health care provider says that you can. This information is not intended to replace advice given to you by your health care provider. Make sure you discuss any questions you have with your health care provider. Document Released: 01/10/2005 Document Revised: 04/11/2016  Document Reviewed: 01/27/2015 Elsevier Interactive Patient Education  2017 [employer].

## 2017-12-30 NOTE — Telephone Encounter (Signed)
Copied from [facility] [id number]. Topic: General - Other >> Dec 30, 2017 12:10 PM Darl Householder, RMA wrote: Reason for CRM: Prescription for Tizanidine 4 mg has been sent to the wrong pharmacy, please resend medication to [facility] per pt they do not use [facility]

## 2017-12-30 NOTE — Progress Notes (Signed)
Subjective:    Patient ID: Ashlee Reyes, female    DOB: [birth year], 84 y.o.   MRN: [id number]  HPI  Ashlee Reyes is a 84 year old female with a history of recurrent UTI, osteoarthritis, spinal stenosis of lumbar spine, IBS who presents today with a chief complaint of back pain.   Her pain is located across the bilateral lower back with the sensation of a "fist" to the left lower back. Her pain began four days ago and has progressed since. She had constipation late last week, took several doses of Milk of Magnesia and has since experienced loose stools except for this morning which was firm.   She denies recent injury/trauma, falls, urinary frequency, dysuria, radiation of pain to her lower extremities, numbness/tingling, loss of bowel/bladder contents. She's taken Tylenol and applied an OTC pain patch without improvement.   Review of Systems  Constitutional: Negative for fever.  Gastrointestinal: Positive for constipation.  Genitourinary: Negative for dysuria, frequency, hematuria and vaginal discharge.  Musculoskeletal: Positive for back pain.  Neurological: Negative for numbness.       Past Medical History:  Diagnosis Date  . AAA (abdominal aortic aneurysm) ([place])    followed by Dr Oneida Alar. Last u/s07/10 stable AAA with largest measurement 3.97 x 4.02cm.  . Allergic rhinitis, seasonal   . Asthma   . Atrial fibrillation ([place])   . CAD (coronary artery disease) 07/2009   mod by cath 08/10  . Complication of anesthesia   . Depression   . Disc disease, degenerative, lumbar or lumbosacral    SPINAL STENOSIS  . Diverticulosis   . GERD (gastroesophageal reflux disease)   . Hemorrhoids   . History of recurrent UTIs   . Hyperlipidemia   . IBS (irritable bowel syndrome)   . IC (irritable colon)    DMSO in past  . Interstitial cystitis   . Osteoarthritis    spine/spinal stenosis  . Osteopenia 2011  . PONV (postoperative nausea and vomiting)   . Skin cancer    basal cell  cancer - face.     Social History   Socioeconomic History  . Marital status: Married    Spouse name: Not on file  . Number of children: Not on file  . Years of education: Not on file  . Highest education level: Not on file  Social Needs  . Financial resource strain: Not on file  . Food insecurity - worry: Not on file  . Food insecurity - inability: Not on file  . Transportation needs - medical: Not on file  . Transportation needs - non-medical: Not on file  Occupational History  . Not on file  Tobacco Use  . Smoking status: Never Smoker  . Smokeless tobacco: Never Used  Substance and Sexual Activity  . Alcohol use: No    Alcohol/week: 0.0 oz  . Drug use: No  . Sexual activity: No  Other Topics Concern  . Not on file  Social History Narrative  . Not on file    Past Surgical History:  Procedure Laterality Date  . ABDOMINAL AORTIC ENDOVASCULAR STENT GRAFT N/A 07/01/2013   Procedure: ABDOMINAL AORTIC ENDOVASCULAR STENT GRAFT- GORE;  Surgeon: Elam Dutch, MD;  Location: [facility];  Service: Vascular;  Laterality: N/A;  Ultrasound guided  . ABDOMINAL HYSTERECTOMY     partial not cancer  . BREAST SURGERY     lumpectomy  . CYSTOCELE REPAIR    . ESOPHAGOGASTRODUODENOSCOPY  09/1998   erosive esphagitis ? stricture treated  12/2002  . HERNIA REPAIR  63/7858   umbilical hernia  . RECTOCELE REPAIR    . REFRACTIVE SURGERY     for film after cataract surgery.  Marland Kitchen RIGHT COLECTOMY  1997   benign  . SKIN LESION EXCISION  05/2011   basal cell skin lesion removed.    Family History  Problem Relation Age of Onset  . Heart disease Mother        deceased age 39 heart attack.  Marland Kitchen Heart attack Mother   . Other Mother        varicose veins  . Varicose Veins Mother   . Heart disease Father        age 61 heart attack  . Cancer Father   . Stroke Brother   . Cancer Brother   . Heart disease Brother   . Heart attack Brother   . Cancer Daughter   . Diabetes Son     Allergies    Allergen Reactions  . Amoxicillin-Pot Clavulanate     REACTION: sick  . Avelox [Moxifloxacin Hcl In Nacl] Nausea Only  . Ibuprofen     REACTION: GI  . Keflex [Cephalexin]     abd pain / GI upset  . Nitrofurantoin Other (See Comments)    Chest pain or indigestion with nausea  . Omeprazole Nausea Only  . Prevnar [Pneumococcal 13-Val Conj Vacc] Itching    Severe local reaction with redness and itching   . Rosuvastatin     REACTION: foot pain  . Sertraline Hcl     REACTION: more depressed  . Tetanus Toxoids     Local redness and swelling     Current Outpatient Medications on File Prior to Visit  Medication Sig Dispense Refill  . acetaminophen (TYLENOL) 500 MG tablet Take 500 mg by mouth every 4 (four) hours as needed for mild pain or moderate pain. For pain    . albuterol (PROAIR HFA) 108 (90 Base) MCG/ACT inhaler Inhale 2 puffs into the lungs every 4 (four) hours as needed for wheezing. 1 Inhaler 2  . beclomethasone (QVAR REDIHALER) 80 MCG/ACT inhaler Inhale 2 puffs into the lungs 2 (two) times daily. 1 Inhaler 5  . cetirizine (ZYRTEC) 10 MG tablet Take 1 tablet (10 mg total) by mouth daily. For allergies 30 tablet 5  . clopidogrel (PLAVIX) 75 MG tablet TAKE 1 TABLET BY MOUTH DAILY 30 tablet 11  . fluticasone (FLONASE) 50 MCG/ACT nasal spray Place 2 sprays into both nostrils daily. 16 g 5  . latanoprost (XALATAN) 0.005 % ophthalmic solution Place 1 drop into both eyes at bedtime.      Marland Kitchen NEXIUM 40 MG capsule TAKE ONE (1) CAPSULE BY MOUTH 2 TIMES DAILY BEFORE A MEAL 60 capsule 3  . Polyethyl Glycol-Propyl Glycol (SYSTANE OP) Place 1 drop into both eyes as needed (dry eyes).     . polyethylene glycol (MIRALAX / GLYCOLAX) packet Take 17 g by mouth daily as needed. For constipation    . ranitidine (ZANTAC) 300 MG tablet Take 1 tablet (300 mg total) by mouth at bedtime. 30 tablet 5  . trimethoprim (TRIMPEX) 100 MG tablet Take 1 tablet (100 mg total) by mouth 2 (two) times daily. (Patient  taking differently: Take 100 mg by mouth daily. ) 60 tablet 5   No current facility-administered medications on file prior to visit.     BP 126/80   Pulse 78   Temp 97.9 F (36.6 C)   LMP 12/17/1968    Objective:  Physical Exam  Constitutional: She appears well-nourished.  Neck: Neck supple.  Musculoskeletal:       Lumbar back: She exhibits decreased range of motion and pain. She exhibits no tenderness.       Back:  Moderate decrease in ROM to lumbar spine. Muscle tightness noted.  Skin: Skin is warm and dry.          Assessment & Plan:  Acute on Chronic Back pain:  Located to lumbar spine as noted above. No alarm signs. Exam with decrease in ROM. Suspect muscle spasms given description and presentation. Rx for Tizanidine course sent to pharmacy with drowsiness precautions provided. Continue heat and tylenol.  Discussed to refrain from sitting too long to avoid increased stiffness and pain. UA today: negative leuks, negative nitrites, 2+ blood. History of hematuria on prior urinalysis specimens dating back 2 years.  Culture sent. She will update in 3-4 days if no improvement. Consider KUB although doesn't seem to present like renal stone.   Sheral Flow, NP

## 2017-12-30 NOTE — Telephone Encounter (Signed)
Pt' daughter, [name], called stating that her mother is complaining of " pt feels like there is a big fist on her left side"; she also says that her mother had a bowel movement on Saturday because she took milk of magnesia, but since she has not had an appetitie she has not taken her miralax; [name] feels like the "fist in her left side is relative to her bowel movement"; nurse triage initiated and recommendation made for pt to be seen by physician within 24 hours; pt already has an appointment with Allie Bossier today with Allie Bossier at 1045; pt's daughter verbalizes that they will keep that appointment.     Reason for Disposition . MODERATE pain (e.g., interferes with normal activities or awakens from sleep)  Answer Assessment - Initial Assessment Questions 1. ONSET: "When did the pain begin?"      Thursday 12/27/17 2. LOCATION: "Where does it hurt?" (upper, mid or lower back)     Left side 3. SEVERITY: "How bad is the pain?"  (e.g., Scale 1-10; mild, moderate, or severe)   - MILD (1-3): doesn't interfere with normal activities    - MODERATE (4-7): interferes with normal activities or awakens from sleep    - SEVERE (8-10): excruciating pain, unable to do any normal activities      Severe; can't roll over in the bed 4. PATTERN: "Is the pain constant?" (e.g., yes, no; constant, intermittent)      Constant sharp with movement, and nagging when still 5. RADIATION: "Does the pain shoot into your legs or elsewhere?"    no 6. CAUSE:  "What do you think is causing the back pain?"      History of back pain; has received injections in her back 7. BACK OVERUSE:  "Any recent lifting of heavy objects, strenuous work or exercise?"     no 8. MEDICATIONS: "What have you taken so far for the pain?" (e.g., nothing, acetaminophen, NSAIDS)     Tylenol take 1000 mg twice yesterday(morning and night) but no relief in pain 9. NEUROLOGIC SYMPTOMS: "Do you have any weakness, numbness, or problems with  bowel/bladder control?"     ?constipation 10. OTHER SYMPTOMS: "Do you have any other symptoms?" (e.g., fever, abdominal pain, burning with urination, blood in urine)       no 11. PREGNANCY: "Is there any chance you are pregnant?" (e.g., yes, no; LMP)       no  Protocols used: FLANK PAIN-A-AH, BACK PAIN-A-AH

## 2017-12-30 NOTE — Telephone Encounter (Signed)
I spoke with [place] at [facility] and she will transfer tizanidine to [facility].

## 2017-12-31 LAB — URINE CULTURE
MICRO NUMBER:: 90053947
Result:: NO GROWTH
SPECIMEN QUALITY: ADEQUATE

## 2018-01-01 ENCOUNTER — Emergency Department (HOSPITAL_COMMUNITY): Payer: Medicare Other

## 2018-01-01 ENCOUNTER — Other Ambulatory Visit: Payer: Self-pay

## 2018-01-01 ENCOUNTER — Encounter (HOSPITAL_COMMUNITY): Payer: Self-pay

## 2018-01-01 ENCOUNTER — Emergency Department (HOSPITAL_COMMUNITY)
Admission: EM | Admit: 2018-01-01 | Discharge: 2018-01-01 | Disposition: A | Discharge status: Home / Self Care | Payer: Medicare Other | Attending: Emergency Medicine | Admitting: Emergency Medicine

## 2018-01-01 DIAGNOSIS — Z87440 Personal history of urinary (tract) infections: Secondary | ICD-10-CM | POA: Insufficient documentation

## 2018-01-01 DIAGNOSIS — M48061 Spinal stenosis, lumbar region without neurogenic claudication: Secondary | ICD-10-CM | POA: Diagnosis not present

## 2018-01-01 DIAGNOSIS — Z79899 Other long term (current) drug therapy: Secondary | ICD-10-CM | POA: Diagnosis not present

## 2018-01-01 DIAGNOSIS — Z95828 Presence of other vascular implants and grafts: Secondary | ICD-10-CM

## 2018-01-01 DIAGNOSIS — N390 Urinary tract infection, site not specified: Secondary | ICD-10-CM | POA: Diagnosis not present

## 2018-01-01 DIAGNOSIS — M545 Low back pain, unspecified: Secondary | ICD-10-CM

## 2018-01-01 DIAGNOSIS — R109 Unspecified abdominal pain: Secondary | ICD-10-CM | POA: Diagnosis not present

## 2018-01-01 DIAGNOSIS — K5900 Constipation, unspecified: Secondary | ICD-10-CM | POA: Diagnosis not present

## 2018-01-01 DIAGNOSIS — R110 Nausea: Secondary | ICD-10-CM

## 2018-01-01 DIAGNOSIS — I2510 Atherosclerotic heart disease of native coronary artery without angina pectoris: Secondary | ICD-10-CM | POA: Diagnosis not present

## 2018-01-01 LAB — BASIC METABOLIC PANEL
ANION GAP: 7 (ref 5–15)
BUN: 12 mg/dL (ref 6–20)
CHLORIDE: 107 mmol/L (ref 101–111)
CO2: 26 mmol/L (ref 22–32)
Calcium: 9.4 mg/dL (ref 8.9–10.3)
Creatinine, Ser: 0.73 mg/dL (ref 0.44–1.00)
GFR calc Af Amer: >60 mL/min (ref >60)
Glucose, Bld: 96 mg/dL (ref 65–99)
POTASSIUM: 3.7 mmol/L (ref 3.5–5.1)
Sodium: 140 mmol/L (ref 135–145)

## 2018-01-01 LAB — CBC WITH DIFFERENTIAL/PLATELET
BASOS ABS: 0 10*3/uL (ref 0.0–0.1)
Basophils Relative: 1 %
Eosinophils Absolute: 0.1 10*3/uL (ref 0.0–0.7)
Eosinophils Relative: 1 %
HEMATOCRIT: 37.7 % (ref 36.0–46.0)
Hemoglobin: 12.5 g/dL (ref 12.0–15.0)
LYMPHS ABS: 1.3 10*3/uL (ref 0.7–4.0)
LYMPHS PCT: 18 %
MCH: 30 pg (ref 26.0–34.0)
MCHC: 33.2 g/dL (ref 30.0–36.0)
MCV: 90.4 fL (ref 78.0–100.0)
MONO ABS: 0.6 10*3/uL (ref 0.1–1.0)
Monocytes Relative: 8 %
NEUTROS ABS: 5.4 10*3/uL (ref 1.7–7.7)
Neutrophils Relative %: 72 %
Platelets: 245 10*3/uL (ref 150–400)
RBC: 4.17 MIL/uL (ref 3.87–5.11)
RDW: 14.5 % (ref 11.5–15.5)
WBC: 7.5 10*3/uL (ref 4.0–10.5)

## 2018-01-01 LAB — URINALYSIS, ROUTINE W REFLEX MICROSCOPIC
Bilirubin Urine: NEGATIVE
Glucose, UA: NEGATIVE mg/dL
KETONES UR: NEGATIVE mg/dL
Leukocytes, UA: NEGATIVE
Nitrite: NEGATIVE
PROTEIN: NEGATIVE mg/dL
Specific Gravity, Urine: 1.003 — ABNORMAL LOW (ref 1.005–1.030)
WBC, UA: NONE SEEN WBC/hpf (ref 0–5)
pH: 7 (ref 5.0–8.0)

## 2018-01-01 LAB — HEPATIC FUNCTION PANEL
ALBUMIN: 3.6 g/dL (ref 3.5–5.0)
ALT: 13 U/L — AB (ref 14–54)
AST: 18 U/L (ref 15–41)
Alkaline Phosphatase: 58 U/L (ref 38–126)
Bilirubin, Direct: <0.1 mg/dL — ABNORMAL LOW (ref 0.1–0.5)
Total Bilirubin: 0.9 mg/dL (ref 0.3–1.2)
Total Protein: 7 g/dL (ref 6.5–8.1)

## 2018-01-01 LAB — LIPASE, BLOOD: Lipase: 25 U/L (ref 11–51)

## 2018-01-01 MED ORDER — ONDANSETRON HCL 4 MG/2ML IJ SOLN
4.0000 mg | Freq: Once | INTRAMUSCULAR | Status: AC
  Administered 2018-01-01: 4 mg via INTRAVENOUS
  Filled 2018-01-01: qty 2

## 2018-01-01 MED ORDER — METHOCARBAMOL 500 MG PO TABS: 500.0000 mg | ORAL_TABLET | Freq: Two times a day (BID) | ORAL | 0 refills | Status: DC

## 2018-01-01 MED ORDER — SODIUM CHLORIDE 0.9 % IV BOLUS (SEPSIS)
1000.0000 mL | Freq: Once | INTRAVENOUS | Status: AC
  Administered 2018-01-01: 1000 mL via INTRAVENOUS

## 2018-01-01 MED ORDER — METOCLOPRAMIDE HCL 5 MG/ML IJ SOLN
10.0000 mg | Freq: Once | INTRAMUSCULAR | Status: AC
  Administered 2018-01-01: 10 mg via INTRAVENOUS
  Filled 2018-01-01: qty 2

## 2018-01-01 MED ORDER — IOPAMIDOL (ISOVUE-300) INJECTION 61%
INTRAVENOUS | Status: AC
  Administered 2018-01-01: 100 mL via INTRAVENOUS
  Filled 2018-01-01: qty 100

## 2018-01-01 MED ORDER — SODIUM CHLORIDE 0.9 % IV SOLN
1000.0000 mL | INTRAVENOUS | Status: DC
  Administered 2018-01-01: 1000 mL via INTRAVENOUS

## 2018-01-01 MED ORDER — IOPAMIDOL (ISOVUE-300) INJECTION 61%
100.0000 mL | Freq: Once | INTRAVENOUS | Status: AC | PRN
  Administered 2018-01-01: 100 mL via INTRAVENOUS

## 2018-01-01 MED ORDER — MORPHINE SULFATE (PF) 4 MG/ML IV SOLN
4.0000 mg | Freq: Once | INTRAVENOUS | Status: AC
  Administered 2018-01-01: 4 mg via INTRAVENOUS
  Filled 2018-01-01: qty 1

## 2018-01-01 MED ORDER — TRAMADOL HCL 50 MG PO TABS: 50.0000 mg | ORAL_TABLET | Freq: Three times a day (TID) | ORAL | 0 refills | Status: DC | PRN

## 2018-01-01 MED ORDER — ONDANSETRON 4 MG PO TBDP: 4.0000 mg | ORAL_TABLET | Freq: Four times a day (QID) | ORAL | 0 refills | Status: DC | PRN

## 2018-01-01 MED ORDER — PROCHLORPERAZINE EDISYLATE 5 MG/ML IJ SOLN
10.0000 mg | Freq: Once | INTRAMUSCULAR | Status: AC
  Administered 2018-01-01: 10 mg via INTRAVENOUS
  Filled 2018-01-01: qty 2

## 2018-01-01 NOTE — Discharge Instructions (Signed)
1)Take methocarbamol 500 mg twice a day for back spasms 2) avoid bending and twisting 3) take tramadol 500 mg up to every 8 hours as needed for moderate to severe pain, may take with Tylenol 500 mg dose 4) follow-up with your back specialist for your severe spinal stenosis at L3-L4 and L4-L5 5) follow-up with her GI physician regarding difficulty with swallowing concerns 6) okay to drink boost or Ensure 1 can 3 times a day if appetite remains poor 7) obtain adequate hydration (drink enough fluids) to avoid dehydration 8) the trimethoprim you take for UTI prophylaxis may interfere with other medications, talked to your pharmacist each time you prescribed a new medication 9) take Zofran as needed for nausea and vomiting 10) consider prunes or prune juice or over-the-counter MiraLAX if constipation becomes a concern 11) follow-up with your regular doctor for recheck in 1-2 weeks, sooner if having side effects from any of the above medications

## 2018-01-01 NOTE — ED Provider Notes (Signed)
CT scan without acute intra-abdominal pathology  Patient continues to have significant nausea associated with ambulation.  Nonfocal neurologic exam.  Back pain improved.  No significant abdominal pain at this time.  Patient will be placed in observational status in the hospital for ongoing management of her symptoms given her advanced age and inability to keep oral fluids down at this time.  [facility] to evaluate at this time  I personally reviewed the imaging tests through PACS system I reviewed available ER/hospitalization records through the EMR  Ct Abdomen Pelvis W Contrast  Result Date: 01/01/2018 CLINICAL DATA:  Constipation and abdominal pain EXAM: CT ABDOMEN AND PELVIS WITH CONTRAST TECHNIQUE: Multidetector CT imaging of the abdomen and pelvis was performed using the standard protocol following bolus administration of intravenous contrast. CONTRAST:  100 mL Isovue 300 nonionic COMPARISON:  CT abdomen and pelvis September 19, 2016; abdomen series January 01, 2018. FINDINGS: Lower chest: There is scarring in the anterior lung bases bilaterally. Lung bases otherwise are clear. There are foci of coronary artery calcification. There is evidence of pectus excavatum. Hepatobiliary: There is hepatic steatosis. No focal liver lesions are appreciable. Gallbladder wall is not appreciably thickened. There is no appreciable biliary duct dilatation. Pancreas: There is no pancreatic mass or inflammatory focus. Spleen: No splenic lesions are evident. Adrenals/Urinary Tract: Adrenals bilaterally appear unremarkable. There is a cyst arising from the posterior mid right kidney measuring 2.3 x 2.0 cm. There are parapelvic cysts in each kidney, ranging in size from 1-2 cm. There is no appreciable hydronephrosis on either side. There is no evident renal or ureteral calculus on either side. Urinary bladder is midline with wall thickness within normal limits. Stomach/Bowel: There are scattered sigmoid diverticula  without diverticulitis. There is moderate stool throughout the colon. Postoperative change in the right colon region is noted with anastomosis patent. No evident bowel obstruction. There is no appreciable free air or portal venous air. Vascular/Lymphatic: The patient has had an aorto bi-iliac stent for abdominal aortic aneurysm a. currently the maximum transverse diameter of the aorta is measured at 3.0 x 2.8 cm. There is no periaortic fluid. Atherosclerotic calcification is noted in the aortic and iliac artery regions. Major mesenteric vessels appear patent. No adenopathy is appreciable in the abdomen or pelvis. Reproductive: Uterus is absent.  No pelvic mass evident. Other: There is no periappendiceal region inflammation. No abscess or ascites is appreciable in the abdomen or pelvis. There is a small midline ventral hernia superior to the umbilicus which contains fat but no bowel. Musculoskeletal: There is mild anterolisthesis of L4 on L5, stable. There is also slight anterolisthesis of L3 on L4, stable. There is multifocal osteoarthritic change in the lumbar spine. There is severe spinal stenosis at L3-4 and L4-5 due to spondylolisthesis, disc protrusion and bony hypertrophy at these levels. Moderate spinal stenosis is noted at L2-3 due to disc protrusion and bony hypertrophy. No blastic or lytic bone lesions are evident. There is no intramuscular or abdominal wall lesion. IMPRESSION: 1. Multiple sigmoid diverticula without diverticulitis. No bowel obstruction. No abscess. No periappendiceal region inflammation. 2. Status post aorto bi-iliac stent for aneurysm. Aorta has a current maximum transverse diameter 3.0 x 2.8 cm. There is extensive aortic and iliac artery atherosclerosis. There is also coronary artery calcification. Major mesenteric vessels appear patent. 3. Severe spinal stenosis at L3-4 and L4-5, multifactorial. Moderate spinal stenosis at L2-3. 4.  Hepatic steatosis.  No focal liver lesions. 5.  No  renal or ureteral calculus.  No hydronephrosis.  5.  Small ventral hernia containing fat but no bowel. Aortic Atherosclerosis (ICD10-I70.0). Electronically Signed   By: Lowella Grip III M.D.   On: 01/01/2018 07:56   Dg Op Swallowing Func-medicare/speech Path  Result Date: 12/13/2017 CLINICAL DATA:  Dysphagia. EXAM: MODIFIED BARIUM SWALLOW TECHNIQUE: Different consistencies of barium were administered orally to the patient by the Speech Pathologist. Imaging of the pharynx was performed in the lateral projection. FLUOROSCOPY TIME:  Fluoroscopy Time:  1 minutes 42 seconds Radiation Exposure Index (if provided by the fluoroscopic device): 5.0 mGy Number of Acquired Spot Images: Cine images obtained. COMPARISON:  Chest x-ray 09/05/2017. FINDINGS: Thin liquid- mild penetration, no aspiration. Nectar thick liquid- mild residue otherwise negative. Pure- mild residue otherwise negative. Pure with cracker- mild residue otherwise negative . Barium tablet -  slight difficulty in passage without obstruction. IMPRESSION: Mild penetration with thin liquid. Mild residue with other substances. No aspiration. Please refer to the Speech Pathologists report for complete details and recommendations. Electronically Signed   By: Marcello Moores  Register   On: 12/13/2017 13:33   Dg Abd Acute W/chest  Result Date: 01/01/2018 CLINICAL DATA:  84 year old female with constipation. EXAM: DG ABDOMEN ACUTE W/ 1V CHEST COMPARISON:  Chest radiograph dated 09/05/2017 and CT of the abdomen pelvis dated 09/19/2016 FINDINGS: There is shallow inspiration with minimal bibasilar atelectasis. No focal consolidation, pleural effusion, or pneumothorax. Stable borderline cardiomegaly. There is atherosclerotic calcification of the thoracic aorta. There is no bowel dilatation or evidence of obstruction. No free air or radiopaque calculi. An aorto bi-iliac endovascular stent graft repair is noted. There is osteopenia with degenerative changes of the  spine. No acute osseous pathology. IMPRESSION: 1. No acute cardiopulmonary process. 2. No bowel dilatation or evidence of obstruction. 3. Endovascular stent graft repair of the abdominal aorta. Electronically Signed   By: Anner Crete M.D.   On: 01/01/2018 05:54      Jola Schmidt, MD 01/01/18 1407

## 2018-01-01 NOTE — ED Notes (Signed)
Bed: [facility] Expected date:  Expected time:  Means of arrival:  Comments: EMS 84 yo female constipation 1 week

## 2018-01-01 NOTE — ED Triage Notes (Signed)
Patient arrives by [facility] with complaints of constipation for 1 week. Patient's daughter states she has seen her primary care physician and has been taking Miralax with no relief.

## 2018-01-01 NOTE — Consult Note (Signed)
Patient Demographics:    Ashlee Reyes, is a 84 y.o. female  MRN: [id number]   DOB - 1921-01-05  Admit Date - 01/01/2018  Outpatient Primary MD for the patient is Tower, Ashlee Fanny, MD   Assessment & Plan:    Principal Problem:   Severe Multi-Level Lumbar stenosis Active Problems:   Frequent UTI/interstitial cystitis   History of endovascular stent graft for abdominal aortic aneurysm (AAA)    1)Low Back Pain-this is a chronic problem, patient sees a spine specialist and gets epidural shots frequently, CT abdomen and pelvis showed severe lumbar stenosis at L3-L4 and L4-L5, as well as moderate lumbar stenosis at L2-L3, patient is advised to take methocarbamol 500 mg twice a day for back spasms,  avoid bending and twisting take tramadol 500 mg up to every 8 hours as needed for moderate to severe pain, may take with Tylenol 500 mg dose, AND  follow-up with back specialist for your severe spinal stenosis at L3-L4 and L4-L5.  No fevers, no chills, no leukocytosis  2)H/o Frequent UTI/interstitial cystitis-patient and family states that this has been well controlled on trimethoprim for UTI prophylaxis, they indicate that they would like to continue this for now, patient will follow up with a regular urologist if any concerns.  No evidence of UTI at this time  3)H/o AAA Repair-CT abdomen and pelvis with extensive atherosclerotic changes but no new acute findings  4)Poor appetite/swallowing concerns-patient appears to be frustrated by her chronic, worsening back pain and this is impacting her appetite, oral intake may improve with better control of back pain, nutritional supplements advised in the meantime.  No evidence of significant constipation this time.  Patient had ENT evaluation with endoscopic procedure 3 weeks ago  with no significant abnormalities found to explain her swallowing concerns, outpatient follow-up with her gastroenterologist advised, Zofran as needed for nausea  5)Disposition/Functionality-despite advanced age patient is able to get out of bed getting back to bed and ambulate safely and independently-may discharge home, extensive workup failed to demonstrate any acute findings that may warrant further in-hospital management.  Patient advised to follow-up with PCP as outpatient, she may also follow-up with her spine specialist and her GI specialist as outpatient   Disposition:- Discharge instructions:- 1)Take methocarbamol 500 mg twice a day for back spasms 2) avoid bending and twisting 3) take tramadol 500 mg up to every 8 hours as needed for moderate to severe pain, may take with Tylenol 500 mg dose 4) follow-up with your back specialist for your severe spinal stenosis at L3-L4 and L4-L5 5) follow-up with her GI physician regarding difficulty with swallowing concerns 6) okay to drink boost or Ensure 1 can 3 times a day if appetite remains poor 7) obtain adequate hydration (drink enough fluids) to avoid dehydration 8) the trimethoprim you take for UTI prophylaxis may interfere with other medications, talked to your pharmacist each time you prescribed a new medication 9) take Zofran as needed  for nausea and vomiting 10) consider prunes or prune juice or over-the-counter MiraLAX if constipation becomes a concern 11) follow-up with your regular doctor for recheck in 1-2 weeks, sooner if having side effects from any of the above medications   With History of - Reviewed by me  Past Medical History:  Diagnosis Date  . AAA (abdominal aortic aneurysm) ([place])    followed by Dr Oneida Alar. Last u/s07/10 stable AAA with largest measurement 3.97 x 4.02cm.  . Allergic rhinitis, seasonal   . Asthma   . Atrial fibrillation ([place])   . CAD (coronary artery disease) 07/2009   mod by cath 08/10  .  Complication of anesthesia   . Depression   . Disc disease, degenerative, lumbar or lumbosacral    SPINAL STENOSIS  . Diverticulosis   . GERD (gastroesophageal reflux disease)   . Hemorrhoids   . History of recurrent UTIs   . Hyperlipidemia   . IBS (irritable bowel syndrome)   . IC (irritable colon)    DMSO in past  . Interstitial cystitis   . Osteoarthritis    spine/spinal stenosis  . Osteopenia 2011  . PONV (postoperative nausea and vomiting)   . Skin cancer    basal cell cancer - face.      Past Surgical History:  Procedure Laterality Date  . ABDOMINAL AORTIC ENDOVASCULAR STENT GRAFT N/A 07/01/2013   Procedure: ABDOMINAL AORTIC ENDOVASCULAR STENT GRAFT- GORE;  Surgeon: Elam Dutch, MD;  Location: [facility];  Service: Vascular;  Laterality: N/A;  Ultrasound guided  . ABDOMINAL HYSTERECTOMY     partial not cancer  . BREAST SURGERY     lumpectomy  . CYSTOCELE REPAIR    . ESOPHAGOGASTRODUODENOSCOPY  09/1998   erosive esphagitis ? stricture treated 12/2002  . HERNIA REPAIR  96/2836   umbilical hernia  . RECTOCELE REPAIR    . REFRACTIVE SURGERY     for film after cataract surgery.  Marland Kitchen RIGHT COLECTOMY  1997   benign  . SKIN LESION EXCISION  05/2011   basal cell skin lesion removed.      Chief Complaint  Patient presents with  . Constipation      HPI:    Ashlee Reyes  is a 84 y.o. female  With  past medical history of prior AAA repair, H/o Paroxysmal atrial fibrillation, history of prior frequent UTIs and interstitial cystitis, chronic back pain and h/o diverticulosis  who presents today for evaluation of low back pain and possible constipation.  She reports her last normal bowel movement was 5 days ago.  She has been using MiraLAX without relief.  She does report some left-sided abdominal discomfort.  She denies any fevers or chills.  She denies any vomiting.  Patient admits to poor oral intake due to back pain, no UTI type symptoms, no fever no chills no falls or  trauma, no headaches no visual disturbance.  No chest pains, shortness of breath dizziness or palpitations  While in ED pt developed nausea and near emesis after receiving morphine sulfate, the symptoms resolved with IV fluids, Zofran and Compazine.  Prior to morphine administration patient had no nausea or vomiting.   Patient was initially seen by Dr. Stark Jock in the ED, subsequently seen by Dr. Venora Maples in ED, when Dr. Venora Maples was trying to discharge patient home after lab and imaging studies were found to be without acute findings family requested consultation from hospitalist service for second opinion.  History obtained from patient, her 2 daughters at [place]  and [name], patient examined, labs and imaging studies reviewed with patient and HER-2 daughters at bedside, questions answered .  Patient actually wants to go home, her daughters  were worried about her going home, after reviewing patient's exam findings, vital signs, lab and imaging studies, patient, and her 2 daughters are now comfortable with discharge home with above discharge instructions.       Review of systems:    In addition to the HPI above,   A full 12 point Review of 10 Systems was done, except as stated above, all other Review of 10 Systems were negative.    Social History:  Reviewed by me    Social History   Tobacco Use  . Smoking status: Never Smoker  . Smokeless tobacco: Never Used  Substance Use Topics  . Alcohol use: No    Alcohol/week: 0.0 oz       Family History :  Reviewed by me    Family History  Problem Relation Age of Onset  . Heart disease Mother        deceased age 61 heart attack.  Marland Kitchen Heart attack Mother   . Other Mother        varicose veins  . Varicose Veins Mother   . Heart disease Father        age 20 heart attack  . Cancer Father   . Stroke Brother   . Cancer Brother   . Heart disease Brother   . Heart attack Brother   . Cancer Daughter   . Diabetes Son     Home  Medications:   Prior to Admission medications   Medication Sig Start Date End Date Taking? Authorizing Provider  acetaminophen (TYLENOL) 500 MG tablet Take 500 mg by mouth every 4 (four) hours as needed for mild pain or moderate pain. For pain   Yes [provider]  acidophilus (RISAQUAD) CAPS capsule Take 1 capsule by mouth daily.   Yes [provider]  albuterol (PROAIR HFA) 108 (90 Base) MCG/ACT inhaler Inhale 2 puffs into the lungs every 4 (four) hours as needed for wheezing. 09/10/17  Yes Kozlow, Donnamarie Poag, MD  beclomethasone (QVAR REDIHALER) 80 MCG/ACT inhaler Inhale 2 puffs into the lungs 2 (two) times daily. 09/10/17  Yes Kozlow, Donnamarie Poag, MD  cetirizine (ZYRTEC) 10 MG tablet Take 1 tablet (10 mg total) by mouth daily. For allergies 09/10/17  Yes Kozlow, Donnamarie Poag, MD  clopidogrel (PLAVIX) 75 MG tablet TAKE 1 TABLET BY MOUTH DAILY 09/17/17  Yes Tower, Ashlee Fanny, MD  fluticasone (FLONASE) 50 MCG/ACT nasal spray Place 2 sprays into both nostrils daily. 03/07/17  Yes Tower, Marne A, MD  LUMIGAN 0.01 % SOLN Place 1 drop into both eyes at bedtime. 12/18/17  Yes [provider]  NEXIUM 40 MG capsule TAKE ONE (1) CAPSULE BY MOUTH 2 TIMES DAILY BEFORE A MEAL 10/11/17  Yes Tower, Ashlee Fanny, MD  Polyethyl Glycol-Propyl Glycol (SYSTANE OP) Place 1 drop into both eyes as needed (dry eyes).    Yes [provider]  polyethylene glycol (MIRALAX / GLYCOLAX) packet Take 17 g by mouth daily as needed. For constipation   Yes [provider]  ranitidine (ZANTAC) 300 MG tablet Take 1 tablet (300 mg total) by mouth at bedtime. 09/10/17  Yes Kozlow, Donnamarie Poag, MD  trimethoprim (TRIMPEX) 100 MG tablet Take 1 tablet (100 mg total) by mouth 2 (two) times daily. Patient taking differently: Take 100 mg by mouth daily.  03/07/17  Yes Tower,  Ashlee Fanny, MD  methocarbamol (ROBAXIN) 500 MG tablet Take 1 tablet (500 mg total) by mouth 2 (two) times daily. For back Spasm 01/01/18   Roxan Hockey, MD    ondansetron (ZOFRAN ODT) 4 MG disintegrating tablet Take 1 tablet (4 mg total) by mouth every 6 (six) hours as needed for nausea or vomiting. 01/01/18   Roxan Hockey, MD  traMADol (ULTRAM) 50 MG tablet Take 1 tablet (50 mg total) by mouth every 8 (eight) hours as needed for moderate pain or severe pain. May take with Tylenol 500 mg each time 01/01/18   Roxan Hockey, MD     Allergies:     Allergies  Allergen Reactions  . Amoxicillin-Pot Clavulanate     REACTION: sick  . Avelox [Moxifloxacin Hcl In Nacl] Nausea Only  . Ibuprofen     REACTION: GI  . Keflex [Cephalexin]     abd pain / GI upset  . Nitrofurantoin Other (See Comments)    Chest pain or indigestion with nausea  . Omeprazole Nausea Only  . Prevnar [Pneumococcal 13-Val Conj Vacc] Itching    Severe local reaction with redness and itching   . Rosuvastatin     REACTION: foot pain  . Sertraline Hcl     REACTION: more depressed  . Tetanus Toxoids     Local redness and swelling      Physical Exam:   Vitals  Blood pressure (!) 123/96, pulse 76, temperature 97.8 F (36.6 C), temperature source Oral, resp. rate 16, last menstrual period 12/17/1968, SpO2 94 %.  Physical Examination: General appearance - alert, well appearing, and in no distress Mental status - alert, oriented to person, place, and time,  Eyes - sclera anicteric Neck - supple, no JVD elevation , Chest - clear  to auscultation bilaterally, symmetrical air movement,  Heart - S1 and S2 normal,  Abdomen - soft, nontender, nondistended, no masses or organomegaly Neurological - screening mental status exam normal, neck supple without rigidity, cranial nerves II through XII intact, DTR's normal and symmetric Extremities - no pedal edema noted, intact peripheral pulses  Skin - warm, dry Back-lumbar tenderness with palpation and range of motion, no step deformity or crepitus, overlying skin in the lumbar area without erythema or inflammatory changes.  No CVA  area tenderness Functionality-despite advanced age patient is able to get out of bed getting back to bed and ambulate safely and independently   Data Review:    CBC Recent Labs  Lab 01/01/18 0455  WBC 7.5  HGB 12.5  HCT 37.7  PLT 245  MCV 90.4  MCH 30.0  MCHC 33.2  RDW 14.5  LYMPHSABS 1.3  MONOABS 0.6  EOSABS 0.1  BASOSABS 0.0   ------------------------------------------------------------------------------------------------------------------  Chemistries  Recent Labs  Lab 01/01/18 0455  NA 140  K 3.7  CL 107  CO2 26  GLUCOSE 96  BUN 12  CREATININE 0.73  CALCIUM 9.4  AST 18  ALT 13*  ALKPHOS 58  BILITOT 0.9   ------------------------------------------------------------------------------------------------------------------ CrCl cannot be calculated (Unknown ideal weight.). ------------------------------------------------------------------------------------------------------------------ No results for input(s): TSH, T4TOTAL, T3FREE, THYROIDAB in the last 72 hours.  Invalid input(s): FREET3   Coagulation profile No results for input(s): INR, PROTIME in the last 168 hours. ------------------------------------------------------------------------------------------------------------------- No results for input(s): DDIMER in the last 72 hours. -------------------------------------------------------------------------------------------------------------------  Cardiac Enzymes No results for input(s): CKMB, TROPONINI, MYOGLOBIN in the last 168 hours.  Invalid input(s): CK ------------------------------------------------------------------------------------------------------------------    Component Value Date/Time   BNP 269.1 (H) 01/11/2015 1129     ---------------------------------------------------------------------------------------------------------------  Urinalysis    Component Value Date/Time   COLORURINE STRAW (A) 01/01/2018 0503   APPEARANCEUR CLEAR  01/01/2018 0503   LABSPEC 1.003 (L) 01/01/2018 0503   PHURINE 7.0 01/01/2018 0503   GLUCOSEU NEGATIVE 01/01/2018 0503   HGBUR MODERATE (A) 01/01/2018 0503   HGBUR small 07/28/2009 1142   BILIRUBINUR NEGATIVE 01/01/2018 0503   BILIRUBINUR negative 12/30/2017 1119   KETONESUR NEGATIVE 01/01/2018 0503   PROTEINUR NEGATIVE 01/01/2018 0503   UROBILINOGEN 0.2 12/30/2017 1119   UROBILINOGEN 0.2 01/11/2015 1128   NITRITE NEGATIVE 01/01/2018 0503   LEUKOCYTESUR NEGATIVE 01/01/2018 0503    ----------------------------------------------------------------------------------------------------------------   Imaging Results:    Ct Abdomen Pelvis W Contrast  Result Date: 01/01/2018 CLINICAL DATA:  Constipation and abdominal pain EXAM: CT ABDOMEN AND PELVIS WITH CONTRAST TECHNIQUE: Multidetector CT imaging of the abdomen and pelvis was performed using the standard protocol following bolus administration of intravenous contrast. CONTRAST:  100 mL Isovue 300 nonionic COMPARISON:  CT abdomen and pelvis September 19, 2016; abdomen series January 01, 2018. FINDINGS: Lower chest: There is scarring in the anterior lung bases bilaterally. Lung bases otherwise are clear. There are foci of coronary artery calcification. There is evidence of pectus excavatum. Hepatobiliary: There is hepatic steatosis. No focal liver lesions are appreciable. Gallbladder wall is not appreciably thickened. There is no appreciable biliary duct dilatation. Pancreas: There is no pancreatic mass or inflammatory focus. Spleen: No splenic lesions are evident. Adrenals/Urinary Tract: Adrenals bilaterally appear unremarkable. There is a cyst arising from the posterior mid right kidney measuring 2.3 x 2.0 cm. There are parapelvic cysts in each kidney, ranging in size from 1-2 cm. There is no appreciable hydronephrosis on either side. There is no evident renal or ureteral calculus on either side. Urinary bladder is midline with wall thickness within  normal limits. Stomach/Bowel: There are scattered sigmoid diverticula without diverticulitis. There is moderate stool throughout the colon. Postoperative change in the right colon region is noted with anastomosis patent. No evident bowel obstruction. There is no appreciable free air or portal venous air. Vascular/Lymphatic: The patient has had an aorto bi-iliac stent for abdominal aortic aneurysm a. currently the maximum transverse diameter of the aorta is measured at 3.0 x 2.8 cm. There is no periaortic fluid. Atherosclerotic calcification is noted in the aortic and iliac artery regions. Major mesenteric vessels appear patent. No adenopathy is appreciable in the abdomen or pelvis. Reproductive: Uterus is absent.  No pelvic mass evident. Other: There is no periappendiceal region inflammation. No abscess or ascites is appreciable in the abdomen or pelvis. There is a small midline ventral hernia superior to the umbilicus which contains fat but no bowel. Musculoskeletal: There is mild anterolisthesis of L4 on L5, stable. There is also slight anterolisthesis of L3 on L4, stable. There is multifocal osteoarthritic change in the lumbar spine. There is severe spinal stenosis at L3-4 and L4-5 due to spondylolisthesis, disc protrusion and bony hypertrophy at these levels. Moderate spinal stenosis is noted at L2-3 due to disc protrusion and bony hypertrophy. No blastic or lytic bone lesions are evident. There is no intramuscular or abdominal wall lesion. IMPRESSION: 1. Multiple sigmoid diverticula without diverticulitis. No bowel obstruction. No abscess. No periappendiceal region inflammation. 2. Status post aorto bi-iliac stent for aneurysm. Aorta has a current maximum transverse diameter 3.0 x 2.8 cm. There is extensive aortic and iliac artery atherosclerosis. There is also coronary artery calcification. Major mesenteric vessels appear patent. 3. Severe spinal stenosis at L3-4 and L4-5, multifactorial. Moderate spinal  stenosis at L2-3. 4.  Hepatic steatosis.  No focal liver lesions. 5.  No renal or ureteral calculus.  No hydronephrosis. 5.  Small ventral hernia containing fat but no bowel. Aortic Atherosclerosis (ICD10-I70.0). Electronically Signed   By: Lowella Grip III M.D.   On: 01/01/2018 07:56   Dg Abd Acute W/chest  Result Date: 01/01/2018 CLINICAL DATA:  84 year old female with constipation. EXAM: DG ABDOMEN ACUTE W/ 1V CHEST COMPARISON:  Chest radiograph dated 09/05/2017 and CT of the abdomen pelvis dated 09/19/2016 FINDINGS: There is shallow inspiration with minimal bibasilar atelectasis. No focal consolidation, pleural effusion, or pneumothorax. Stable borderline cardiomegaly. There is atherosclerotic calcification of the thoracic aorta. There is no bowel dilatation or evidence of obstruction. No free air or radiopaque calculi. An aorto bi-iliac endovascular stent graft repair is noted. There is osteopenia with degenerative changes of the spine. No acute osseous pathology. IMPRESSION: 1. No acute cardiopulmonary process. 2. No bowel dilatation or evidence of obstruction. 3. Endovascular stent graft repair of the abdominal aorta. Electronically Signed   By: Anner Crete M.D.   On: 01/01/2018 05:54    Radiological Exams on Admission: Ct Abdomen Pelvis W Contrast  Result Date: 01/01/2018 CLINICAL DATA:  Constipation and abdominal pain EXAM: CT ABDOMEN AND PELVIS WITH CONTRAST TECHNIQUE: Multidetector CT imaging of the abdomen and pelvis was performed using the standard protocol following bolus administration of intravenous contrast. CONTRAST:  100 mL Isovue 300 nonionic COMPARISON:  CT abdomen and pelvis September 19, 2016; abdomen series January 01, 2018. FINDINGS: Lower chest: There is scarring in the anterior lung bases bilaterally. Lung bases otherwise are clear. There are foci of coronary artery calcification. There is evidence of pectus excavatum. Hepatobiliary: There is hepatic steatosis. No focal  liver lesions are appreciable. Gallbladder wall is not appreciably thickened. There is no appreciable biliary duct dilatation. Pancreas: There is no pancreatic mass or inflammatory focus. Spleen: No splenic lesions are evident. Adrenals/Urinary Tract: Adrenals bilaterally appear unremarkable. There is a cyst arising from the posterior mid right kidney measuring 2.3 x 2.0 cm. There are parapelvic cysts in each kidney, ranging in size from 1-2 cm. There is no appreciable hydronephrosis on either side. There is no evident renal or ureteral calculus on either side. Urinary bladder is midline with wall thickness within normal limits. Stomach/Bowel: There are scattered sigmoid diverticula without diverticulitis. There is moderate stool throughout the colon. Postoperative change in the right colon region is noted with anastomosis patent. No evident bowel obstruction. There is no appreciable free air or portal venous air. Vascular/Lymphatic: The patient has had an aorto bi-iliac stent for abdominal aortic aneurysm a. currently the maximum transverse diameter of the aorta is measured at 3.0 x 2.8 cm. There is no periaortic fluid. Atherosclerotic calcification is noted in the aortic and iliac artery regions. Major mesenteric vessels appear patent. No adenopathy is appreciable in the abdomen or pelvis. Reproductive: Uterus is absent.  No pelvic mass evident. Other: There is no periappendiceal region inflammation. No abscess or ascites is appreciable in the abdomen or pelvis. There is a small midline ventral hernia superior to the umbilicus which contains fat but no bowel. Musculoskeletal: There is mild anterolisthesis of L4 on L5, stable. There is also slight anterolisthesis of L3 on L4, stable. There is multifocal osteoarthritic change in the lumbar spine. There is severe spinal stenosis at L3-4 and L4-5 due to spondylolisthesis, disc protrusion and bony hypertrophy at these levels. Moderate spinal stenosis is noted at L2-3  due to disc protrusion  and bony hypertrophy. No blastic or lytic bone lesions are evident. There is no intramuscular or abdominal wall lesion. IMPRESSION: 1. Multiple sigmoid diverticula without diverticulitis. No bowel obstruction. No abscess. No periappendiceal region inflammation. 2. Status post aorto bi-iliac stent for aneurysm. Aorta has a current maximum transverse diameter 3.0 x 2.8 cm. There is extensive aortic and iliac artery atherosclerosis. There is also coronary artery calcification. Major mesenteric vessels appear patent. 3. Severe spinal stenosis at L3-4 and L4-5, multifactorial. Moderate spinal stenosis at L2-3. 4.  Hepatic steatosis.  No focal liver lesions. 5.  No renal or ureteral calculus.  No hydronephrosis. 5.  Small ventral hernia containing fat but no bowel. Aortic Atherosclerosis (ICD10-I70.0). Electronically Signed   By: Lowella Grip III M.D.   On: 01/01/2018 07:56   Dg Abd Acute W/chest  Result Date: 01/01/2018 CLINICAL DATA:  84 year old female with constipation. EXAM: DG ABDOMEN ACUTE W/ 1V CHEST COMPARISON:  Chest radiograph dated 09/05/2017 and CT of the abdomen pelvis dated 09/19/2016 FINDINGS: There is shallow inspiration with minimal bibasilar atelectasis. No focal consolidation, pleural effusion, or pneumothorax. Stable borderline cardiomegaly. There is atherosclerotic calcification of the thoracic aorta. There is no bowel dilatation or evidence of obstruction. No free air or radiopaque calculi. An aorto bi-iliac endovascular stent graft repair is noted. There is osteopenia with degenerative changes of the spine. No acute osseous pathology. IMPRESSION: 1. No acute cardiopulmonary process. 2. No bowel dilatation or evidence of obstruction. 3. Endovascular stent graft repair of the abdominal aorta. Electronically Signed   By: Anner Crete M.D.   On: 01/01/2018 05:54    Family Communication: Admission, patients condition and plan of care including tests being ordered  have been discussed with the patient and daughters [name] and [name] at bedside who indicate understanding and agree with the plan   Code Status - Full Code  Likely DC to  home  Condition   Stable  Roxan Hockey M.D on 01/01/2018 at 2:29 PM Discharge instructions:- 1)Take methocarbamol 500 mg twice a day for back spasms 2) avoid bending and twisting 3) take tramadol 500 mg up to every 8 hours as needed for moderate to severe pain, may take with Tylenol 500 mg dose 4) follow-up with your back specialist for your severe spinal stenosis at L3-L4 and L4-L5 5) follow-up with her GI physician regarding difficulty with swallowing concerns 6) okay to drink boost or Ensure 1 can 3 times a day if appetite remains poor 7) obtain adequate hydration (drink enough fluids) to avoid dehydration 8) the trimethoprim you take for UTI prophylaxis may interfere with other medications, talked to your pharmacist each time you prescribed a new medication 9) take Zofran as needed for nausea and vomiting 10) consider prunes or prune juice or over-the-counter MiraLAX if constipation becomes a concern 11) follow-up with your regular doctor for recheck in 1-2 weeks, sooner if having side effects from any of the above medications  Between 7am to 7pm - Pager - [phone] After 7pm go to [email or web address] - password [email or web address]  [facility] - Office  [phone]  Voice Recognition Viviann Spare dictation system was used to create this note, attempts have been made to correct errors. Please contact the author with questions and/or clarifications.

## 2018-01-01 NOTE — ED Notes (Signed)
Bed: YT46 Expected date:  Expected time:  Means of arrival:  Comments: [place] [facility] pt

## 2018-01-01 NOTE — ED Provider Notes (Signed)
[facility] DEPT Provider Note   CSN: [id number] Arrival date & time: 01/01/18  0403     History   Chief Complaint Chief Complaint  Patient presents with  . Constipation    HPI Ashlee Reyes is a 84 y.o. female.  Patient is a 84 year old female with past medical history of AAA repair, atrial fibrillation, and diverticulitis.  She presents today for evaluation of constipation.  She reports her last normal bowel movement was 5 days ago.  She has been using MiraLAX without relief.  She does report some left-sided abdominal discomfort.  She denies any fevers or chills.  She denies any vomiting.   The history is provided by the patient.  Constipation   This is a new problem. Episode onset: 5 days ago. Associated symptoms include abdominal pain. She has tried osmotic agents for the symptoms. The treatment provided no relief.    Past Medical History:  Diagnosis Date  . AAA (abdominal aortic aneurysm) ([place])    followed by Dr Oneida Alar. Last u/s07/10 stable AAA with largest measurement 3.97 x 4.02cm.  . Allergic rhinitis, seasonal   . Asthma   . Atrial fibrillation ([place])   . CAD (coronary artery disease) 07/2009   mod by cath 08/10  . Complication of anesthesia   . Depression   . Disc disease, degenerative, lumbar or lumbosacral    SPINAL STENOSIS  . Diverticulosis   . GERD (gastroesophageal reflux disease)   . Hemorrhoids   . History of recurrent UTIs   . Hyperlipidemia   . IBS (irritable bowel syndrome)   . IC (irritable colon)    DMSO in past  . Interstitial cystitis   . Osteoarthritis    spine/spinal stenosis  . Osteopenia 2011  . PONV (postoperative nausea and vomiting)   . Skin cancer    basal cell cancer - face.    Patient Active Problem List   Diagnosis Date Noted  . Unilateral primary osteoarthritis, left knee 07/11/2017  . Hyperlipidemia 03/12/2017  . Frequent UTI 02/06/2016  . Hypotension 01/09/2016  . Slurred speech  06/01/2015  . Carotid stenosis 06/01/2015  . Local reaction to immunization 02/21/2015  . Encounter for Medicare annual wellness exam 02/15/2015  . Estrogen deficiency 02/15/2015  . Sinusitis, chronic 01/12/2015  . IBS (irritable bowel syndrome) 01/12/2015  . Atypical chest pain 01/11/2015  . Dizziness   . Pulsatile tinnitus of left ear 07/08/2014  . Palpitations 05/13/2014  . Diarrhea 12/23/2013  . Gastritis 10/28/2013  . Second degree heart block 10/18/2013  . Syncope 10/17/2013  . Abdominal aneurysm without mention of rupture 06/25/2013  . Osteopenia 01/13/2013  . CAD (coronary artery disease)   . Chronic depression 06/03/2007  . GERD 06/02/2007  . Asthma   . Chronic interstitial cystitis   . Osteoarthritis   . Spinal stenosis of lumbar region   . History of urinary tract infection     Past Surgical History:  Procedure Laterality Date  . ABDOMINAL AORTIC ENDOVASCULAR STENT GRAFT N/A 07/01/2013   Procedure: ABDOMINAL AORTIC ENDOVASCULAR STENT GRAFT- GORE;  Surgeon: Elam Dutch, MD;  Location: [facility];  Service: Vascular;  Laterality: N/A;  Ultrasound guided  . ABDOMINAL HYSTERECTOMY     partial not cancer  . BREAST SURGERY     lumpectomy  . CYSTOCELE REPAIR    . ESOPHAGOGASTRODUODENOSCOPY  09/1998   erosive esphagitis ? stricture treated 12/2002  . HERNIA REPAIR  01/6377   umbilical hernia  . RECTOCELE REPAIR    .  REFRACTIVE SURGERY     for film after cataract surgery.  Marland Kitchen RIGHT COLECTOMY  1997   benign  . SKIN LESION EXCISION  05/2011   basal cell skin lesion removed.    OB History    No data available       Home Medications    Prior to Admission medications   Medication Sig Start Date End Date Taking? Authorizing Provider  acetaminophen (TYLENOL) 500 MG tablet Take 500 mg by mouth every 4 (four) hours as needed for mild pain or moderate pain. For pain    [provider]  albuterol (PROAIR HFA) 108 (90 Base) MCG/ACT inhaler Inhale 2 puffs into  the lungs every 4 (four) hours as needed for wheezing. 09/10/17   Kozlow, Donnamarie Poag, MD  beclomethasone (QVAR REDIHALER) 80 MCG/ACT inhaler Inhale 2 puffs into the lungs 2 (two) times daily. 09/10/17   Kozlow, Donnamarie Poag, MD  cetirizine (ZYRTEC) 10 MG tablet Take 1 tablet (10 mg total) by mouth daily. For allergies 09/10/17   Kozlow, Donnamarie Poag, MD  clopidogrel (PLAVIX) 75 MG tablet TAKE 1 TABLET BY MOUTH DAILY 09/17/17   Tower, Wynelle Fanny, MD  fluticasone (FLONASE) 50 MCG/ACT nasal spray Place 2 sprays into both nostrils daily. 03/07/17   Tower, Wynelle Fanny, MD  latanoprost (XALATAN) 0.005 % ophthalmic solution Place 1 drop into both eyes at bedtime.      [provider]  NEXIUM 40 MG capsule TAKE ONE (1) CAPSULE BY MOUTH 2 TIMES DAILY BEFORE A MEAL 10/11/17   Tower, Wynelle Fanny, MD  Polyethyl Glycol-Propyl Glycol (SYSTANE OP) Place 1 drop into both eyes as needed (dry eyes).     [provider]  polyethylene glycol (MIRALAX / GLYCOLAX) packet Take 17 g by mouth daily as needed. For constipation    [provider]  ranitidine (ZANTAC) 300 MG tablet Take 1 tablet (300 mg total) by mouth at bedtime. 09/10/17   Kozlow, Donnamarie Poag, MD  tiZANidine (ZANAFLEX) 4 MG tablet Take 1 tablet (4 mg total) by mouth every 8 (eight) hours as needed for muscle spasms. 12/30/17   Pleas Koch, NP  trimethoprim (TRIMPEX) 100 MG tablet Take 1 tablet (100 mg total) by mouth 2 (two) times daily. Patient taking differently: Take 100 mg by mouth daily.  03/07/17   Tower, Wynelle Fanny, MD    Family History Family History  Problem Relation Age of Onset  . Heart disease Mother        deceased age 62 heart attack.  Marland Kitchen Heart attack Mother   . Other Mother        varicose veins  . Varicose Veins Mother   . Heart disease Father        age 53 heart attack  . Cancer Father   . Stroke Brother   . Cancer Brother   . Heart disease Brother   . Heart attack Brother   . Cancer Daughter   . Diabetes Son     Social  History Social History   Tobacco Use  . Smoking status: Never Smoker  . Smokeless tobacco: Never Used  Substance Use Topics  . Alcohol use: No    Alcohol/week: 0.0 oz  . Drug use: No     Allergies   Amoxicillin-pot clavulanate; Avelox [moxifloxacin hcl in nacl]; Ibuprofen; Keflex [cephalexin]; Nitrofurantoin; Omeprazole; Prevnar [pneumococcal 13-val conj vacc]; Rosuvastatin; Sertraline hcl; and Tetanus toxoids   Review of Systems Review of Systems  Gastrointestinal: Positive for abdominal pain and constipation.  All other systems reviewed and are negative.    Physical Exam Updated Vital Signs BP (!) 184/79 (BP Location: Right Arm)   Pulse 79   Temp 97.8 F (36.6 C) (Oral)   Resp 20   LMP 12/17/1968   SpO2 98%   Physical Exam  Constitutional: She is oriented to person, place, and time. She appears well-developed and well-nourished. No distress.  HENT:  Head: Normocephalic and atraumatic.  Neck: Normal range of motion. Neck supple.  Cardiovascular: Normal rate and regular rhythm. Exam reveals no gallop and no friction rub.  No murmur heard. Pulmonary/Chest: Effort normal and breath sounds normal. No respiratory distress. She has no wheezes.  Abdominal: Soft. Bowel sounds are normal. She exhibits no distension and no mass. There is tenderness. There is no rebound and no guarding.  There is tenderness to palpation in the left mid abdomen.  Genitourinary:  Genitourinary Comments: Rectal examination is unremarkable.  There is no fecal impaction and no significant stool in the rectal vault.  Musculoskeletal: Normal range of motion.  Neurological: She is alert and oriented to person, place, and time.  Skin: Skin is warm and dry. She is not diaphoretic.  Nursing note and vitals reviewed.    ED Treatments / Results  Labs (all labs ordered are listed, but only abnormal results are displayed) Labs Reviewed  BASIC METABOLIC PANEL  CBC WITH DIFFERENTIAL/PLATELET     EKG  EKG Interpretation None       Radiology No results found.  Procedures Procedures (including critical care time)  Medications Ordered in ED Medications - No data to display   Initial Impression / Assessment and Plan / ED Course  I have reviewed the triage vital signs and the nursing notes.  Pertinent labs & imaging results that were available during my care of the patient were reviewed by me and considered in my medical decision making (see chart for details).  Patient presenting with complaints of left-sided abdominal pain.  She reports no bowel movement for several days and believe she may be constipated.  She was given MiraLAX by her primary doctor, however this has not helped.  Her workup today reveals no laboratory abnormality.  She is afebrile, has no white count, but exam is remarkable for tenderness in the left mid abdomen.  A CT scan has been obtained and results are pending.  Care will be signed out to the oncoming provider at shift change.  He will find the results of the CT scan and determine the final disposition.  Final Clinical Impressions(s) / ED Diagnoses   Final diagnoses:  None    ED Discharge Orders    None       Veryl Speak, MD 01/01/18 2304

## 2018-01-02 ENCOUNTER — Ambulatory Visit (INDEPENDENT_AMBULATORY_CARE_PROVIDER_SITE_OTHER): Payer: Medicare Other

## 2018-01-02 ENCOUNTER — Encounter (INDEPENDENT_AMBULATORY_CARE_PROVIDER_SITE_OTHER): Payer: Self-pay | Admitting: Physical Medicine and Rehabilitation

## 2018-01-02 ENCOUNTER — Ambulatory Visit (INDEPENDENT_AMBULATORY_CARE_PROVIDER_SITE_OTHER): Payer: Medicare Other | Admitting: Physical Medicine and Rehabilitation

## 2018-01-02 VITALS — BP 136/69 | HR 69 | Temp 98.3°F

## 2018-01-02 DIAGNOSIS — M48062 Spinal stenosis, lumbar region with neurogenic claudication: Secondary | ICD-10-CM

## 2018-01-02 DIAGNOSIS — M5416 Radiculopathy, lumbar region: Secondary | ICD-10-CM | POA: Diagnosis not present

## 2018-01-02 MED ORDER — BETAMETHASONE SOD PHOS & ACET 6 (3-3) MG/ML IJ SUSP: 12.0000 mg | Freq: Once | INTRAMUSCULAR | Status: DC

## 2018-01-02 NOTE — Patient Instructions (Signed)

## 2018-01-02 NOTE — Progress Notes (Deleted)
Pt states sharp pain in lower back. Pt states pain has been going on for about 1 week. Pt states weakness in left knee also. Pt states last injection helped out a lot and lasted until about November 2018. +Driver, +BT (has been off it for 1 week), -Dye Allergy.

## 2018-01-14 ENCOUNTER — Ambulatory Visit: Payer: Medicare Other | Admitting: Family Medicine

## 2018-01-14 ENCOUNTER — Ambulatory Visit (INDEPENDENT_AMBULATORY_CARE_PROVIDER_SITE_OTHER): Payer: Medicare Other | Admitting: Family Medicine

## 2018-01-14 ENCOUNTER — Telehealth (INDEPENDENT_AMBULATORY_CARE_PROVIDER_SITE_OTHER): Payer: Self-pay | Admitting: Physical Medicine and Rehabilitation

## 2018-01-14 ENCOUNTER — Encounter: Payer: Self-pay | Admitting: Family Medicine

## 2018-01-14 VITALS — BP 108/62 | HR 72 | Temp 98.8°F | Ht 66.5 in

## 2018-01-14 DIAGNOSIS — Z87440 Personal history of urinary (tract) infections: Secondary | ICD-10-CM

## 2018-01-14 DIAGNOSIS — M48061 Spinal stenosis, lumbar region without neurogenic claudication: Secondary | ICD-10-CM | POA: Diagnosis not present

## 2018-01-14 MED ORDER — PREDNISONE 10 MG PO TABS: ORAL_TABLET | ORAL | 0 refills | Status: DC

## 2018-01-14 NOTE — Telephone Encounter (Signed)
Not sure I have much to offer except continue tramadol and muscle relaxer, heat/ice, consider PT and possibly TENS unit. Could try short course of prednisone could see PCP.

## 2018-01-14 NOTE — Progress Notes (Signed)
Subjective:    Patient ID: Ashlee Reyes, female    DOB: Dec 15, 1921, 84 y.o.   MRN: [id number]  HPI  Here for low back pain  Seen on 1/14 by NP Carlis Abbott  C/o of bilateral low back pain w/o radiation   Hx of OA and spinal stenosis baseline   ua pos for blood and cx sent -neg for infection  Px tizanidine given  This became worse and she was seen in the ED on 1/16 -she was kept for obs  CT showed severe lumbar stenosis at L3-L4 and L4-5 / moderate at L2-3 Adv to take methocarbamol and tramadol and f/u with back specialist   Swallowing concerns and low appetite discussed -- GI f/u adv if no improvement  Recommended ensure three times daily if needed  zofran for nausea  Prunes and miralax for constiipation prn   CT: Ct Abdomen Pelvis W Contrast  Result Date: 01/01/2018 CLINICAL DATA:  Constipation and abdominal pain EXAM: CT ABDOMEN AND PELVIS WITH CONTRAST TECHNIQUE: Multidetector CT imaging of the abdomen and pelvis was performed using the standard protocol following bolus administration of intravenous contrast. CONTRAST:  100 mL Isovue 300 nonionic COMPARISON:  CT abdomen and pelvis September 19, 2016; abdomen series January 01, 2018. FINDINGS: Lower chest: There is scarring in the anterior lung bases bilaterally. Lung bases otherwise are clear. There are foci of coronary artery calcification. There is evidence of pectus excavatum. Hepatobiliary: There is hepatic steatosis. No focal liver lesions are appreciable. Gallbladder wall is not appreciably thickened. There is no appreciable biliary duct dilatation. Pancreas: There is no pancreatic mass or inflammatory focus. Spleen: No splenic lesions are evident. Adrenals/Urinary Tract: Adrenals bilaterally appear unremarkable. There is a cyst arising from the posterior mid right kidney measuring 2.3 x 2.0 cm. There are parapelvic cysts in each kidney, ranging in size from 1-2 cm. There is no appreciable hydronephrosis on either side. There is  no evident renal or ureteral calculus on either side. Urinary bladder is midline with wall thickness within normal limits. Stomach/Bowel: There are scattered sigmoid diverticula without diverticulitis. There is moderate stool throughout the colon. Postoperative change in the right colon region is noted with anastomosis patent. No evident bowel obstruction. There is no appreciable free air or portal venous air. Vascular/Lymphatic: The patient has had an aorto bi-iliac stent for abdominal aortic aneurysm a. currently the maximum transverse diameter of the aorta is measured at 3.0 x 2.8 cm. There is no periaortic fluid. Atherosclerotic calcification is noted in the aortic and iliac artery regions. Major mesenteric vessels appear patent. No adenopathy is appreciable in the abdomen or pelvis. Reproductive: Uterus is absent.  No pelvic mass evident. Other: There is no periappendiceal region inflammation. No abscess or ascites is appreciable in the abdomen or pelvis. There is a small midline ventral hernia superior to the umbilicus which contains fat but no bowel. Musculoskeletal: There is mild anterolisthesis of L4 on L5, stable. There is also slight anterolisthesis of L3 on L4, stable. There is multifocal osteoarthritic change in the lumbar spine. There is severe spinal stenosis at L3-4 and L4-5 due to spondylolisthesis, disc protrusion and bony hypertrophy at these levels. Moderate spinal stenosis is noted at L2-3 due to disc protrusion and bony hypertrophy. No blastic or lytic bone lesions are evident. There is no intramuscular or abdominal wall lesion. IMPRESSION: 1. Multiple sigmoid diverticula without diverticulitis. No bowel obstruction. No abscess. No periappendiceal region inflammation. 2. Status post aorto bi-iliac stent for aneurysm. Aorta has  a current maximum transverse diameter 3.0 x 2.8 cm. There is extensive aortic and iliac artery atherosclerosis. There is also coronary artery calcification. Major  mesenteric vessels appear patent. 3. Severe spinal stenosis at L3-4 and L4-5, multifactorial. Moderate spinal stenosis at L2-3. 4.  Hepatic steatosis.  No focal liver lesions. 5.  No renal or ureteral calculus.  No hydronephrosis. 5.  Small ventral hernia containing fat but no bowel. Aortic Atherosclerosis (ICD10-I70.0). Electronically Signed   By: Lowella Grip III M.D.   On: 01/01/2018 07:56   Dg Abd Acute W/chest  Result Date: 01/01/2018 CLINICAL DATA:  84 year old female with constipation. EXAM: DG ABDOMEN ACUTE W/ 1V CHEST COMPARISON:  Chest radiograph dated 09/05/2017 and CT of the abdomen pelvis dated 09/19/2016 FINDINGS: There is shallow inspiration with minimal bibasilar atelectasis. No focal consolidation, pleural effusion, or pneumothorax. Stable borderline cardiomegaly. There is atherosclerotic calcification of the thoracic aorta. There is no bowel dilatation or evidence of obstruction. No free air or radiopaque calculi. An aorto bi-iliac endovascular stent graft repair is noted. There is osteopenia with degenerative changes of the spine. No acute osseous pathology. IMPRESSION: 1. No acute cardiopulmonary process. 2. No bowel dilatation or evidence of obstruction. 3. Endovascular stent graft repair of the abdominal aorta. Electronically Signed   By: Anner Crete M.D.   On: 01/01/2018 05:54   Xr C-arm No Report  Result Date: 01/02/2018 Please see Notes or Procedures tab for imaging impression.    She then went on to have lumbosacral epidural steroid injection on 1/17 with Dr Ernestina Patches   Pain is still there - improved a lot however  If she is not taking pain medication - comes back  Taking tramadol twice daily (once a day did not control it) -tolerates it very well  Using walker at all times  Tylenol as needed  Muscle relaxer at night - methocarbamol (does make her sleepy)   Surgery is not an option at her age   Needs me to take over the tramadol   Also had dental work and  pulled teeth recently    Slept pretty well last night   Patient Active Problem List   Diagnosis Date Noted  . History of endovascular stent graft for abdominal aortic aneurysm (AAA) 01/01/2018  . Unilateral primary osteoarthritis, left knee 07/11/2017  . Hyperlipidemia 03/12/2017  . Frequent UTI/interstitial cystitis 02/06/2016  . Hypotension 01/09/2016  . Slurred speech 06/01/2015  . Carotid stenosis 06/01/2015  . Local reaction to immunization 02/21/2015  . Encounter for Medicare annual wellness exam 02/15/2015  . Estrogen deficiency 02/15/2015  . Sinusitis, chronic 01/12/2015  . IBS (irritable bowel syndrome) 01/12/2015  . Atypical chest pain 01/11/2015  . Dizziness   . Pulsatile tinnitus of left ear 07/08/2014  . Palpitations 05/13/2014  . Diarrhea 12/23/2013  . Gastritis 10/28/2013  . Second degree heart block 10/18/2013  . Syncope 10/17/2013  . Abdominal aneurysm without mention of rupture 06/25/2013  . Osteopenia 01/13/2013  . CAD (coronary artery disease)   . Chronic depression 06/03/2007  . GERD 06/02/2007  . Asthma   . Chronic interstitial cystitis   . Osteoarthritis   . Spinal stenosis of lumbar region   . History of urinary tract infection    Past Medical History:  Diagnosis Date  . AAA (abdominal aortic aneurysm) ([place])    followed by Dr Oneida Alar. Last u/s07/10 stable AAA with largest measurement 3.97 x 4.02cm.  . Allergic rhinitis, seasonal   . Asthma   . Atrial fibrillation ([place])   .  CAD (coronary artery disease) 07/2009   mod by cath 08/10  . Complication of anesthesia   . Depression   . Disc disease, degenerative, lumbar or lumbosacral    SPINAL STENOSIS  . Diverticulosis   . GERD (gastroesophageal reflux disease)   . Hemorrhoids   . History of recurrent UTIs   . Hyperlipidemia   . IBS (irritable bowel syndrome)   . IC (irritable colon)    DMSO in past  . Interstitial cystitis   . Osteoarthritis    spine/spinal stenosis  . Osteopenia 2011    . PONV (postoperative nausea and vomiting)   . Skin cancer    basal cell cancer - face.   Past Surgical History:  Procedure Laterality Date  . ABDOMINAL AORTIC ENDOVASCULAR STENT GRAFT N/A 07/01/2013   Procedure: ABDOMINAL AORTIC ENDOVASCULAR STENT GRAFT- GORE;  Surgeon: Elam Dutch, MD;  Location: [facility];  Service: Vascular;  Laterality: N/A;  Ultrasound guided  . ABDOMINAL HYSTERECTOMY     partial not cancer  . BREAST SURGERY     lumpectomy  . CYSTOCELE REPAIR    . ESOPHAGOGASTRODUODENOSCOPY  09/1998   erosive esphagitis ? stricture treated 12/2002  . HERNIA REPAIR  [id number]/7616   umbilical hernia  . RECTOCELE REPAIR    . REFRACTIVE SURGERY     for film after cataract surgery.  Marland Kitchen RIGHT COLECTOMY  1997   benign  . SKIN LESION EXCISION  05/2011   basal cell skin lesion removed.   Social History   Tobacco Use  . Smoking status: Never Smoker  . Smokeless tobacco: Never Used  Substance Use Topics  . Alcohol use: No    Alcohol/week: 0.0 oz  . Drug use: No   Family History  Problem Relation Age of Onset  . Heart disease Mother        deceased age 83 heart attack.  Marland Kitchen Heart attack Mother   . Other Mother        varicose veins  . Varicose Veins Mother   . Heart disease Father        age 82 heart attack  . Cancer Father   . Stroke Brother   . Cancer Brother   . Heart disease Brother   . Heart attack Brother   . Cancer Daughter   . Diabetes Son    Allergies  Allergen Reactions  . Amoxicillin-Pot Clavulanate     REACTION: sick  . Avelox [Moxifloxacin Hcl In Nacl] Nausea Only  . Ibuprofen     REACTION: GI  . Keflex [Cephalexin]     abd pain / GI upset  . Nitrofurantoin Other (See Comments)    Chest pain or indigestion with nausea  . Omeprazole Nausea Only  . Prevnar [Pneumococcal 13-Val Conj Vacc] Itching    Severe local reaction with redness and itching   . Rosuvastatin     REACTION: foot pain  . Sertraline Hcl     REACTION: more depressed  . Tetanus Toxoids      Local redness and swelling    Current Outpatient Medications on File Prior to Visit  Medication Sig Dispense Refill  . acetaminophen (TYLENOL) 500 MG tablet Take 500 mg by mouth every 4 (four) hours as needed for mild pain or moderate pain. For pain    . acidophilus (RISAQUAD) CAPS capsule Take 1 capsule by mouth daily.    Marland Kitchen albuterol (PROAIR HFA) 108 (90 Base) MCG/ACT inhaler Inhale 2 puffs into the lungs every 4 (four) hours as needed for  wheezing. 1 Inhaler 2  . beclomethasone (QVAR REDIHALER) 80 MCG/ACT inhaler Inhale 2 puffs into the lungs 2 (two) times daily. 1 Inhaler 5  . cetirizine (ZYRTEC) 10 MG tablet Take 1 tablet (10 mg total) by mouth daily. For allergies 30 tablet 5  . clopidogrel (PLAVIX) 75 MG tablet TAKE 1 TABLET BY MOUTH DAILY 30 tablet 11  . fluticasone (FLONASE) 50 MCG/ACT nasal spray Place 2 sprays into both nostrils daily. 16 g 5  . LUMIGAN 0.01 % SOLN Place 1 drop into both eyes at bedtime.    . methocarbamol (ROBAXIN) 500 MG tablet Take 1 tablet (500 mg total) by mouth 2 (two) times daily. For back Spasm 60 tablet 0  . NEXIUM 40 MG capsule TAKE ONE (1) CAPSULE BY MOUTH 2 TIMES DAILY BEFORE A MEAL 60 capsule 3  . ondansetron (ZOFRAN ODT) 4 MG disintegrating tablet Take 1 tablet (4 mg total) by mouth every 6 (six) hours as needed for nausea or vomiting. 15 tablet 0  . Polyethyl Glycol-Propyl Glycol (SYSTANE OP) Place 1 drop into both eyes as needed (dry eyes).     . polyethylene glycol (MIRALAX / GLYCOLAX) packet Take 17 g by mouth daily as needed. For constipation    . ranitidine (ZANTAC) 300 MG tablet Take 1 tablet (300 mg total) by mouth at bedtime. 30 tablet 5  . traMADol (ULTRAM) 50 MG tablet Take 1 tablet (50 mg total) by mouth every 8 (eight) hours as needed for moderate pain or severe pain. May take with Tylenol 500 mg each time 15 tablet 0  . trimethoprim (TRIMPEX) 100 MG tablet Take 1 tablet (100 mg total) by mouth 2 (two) times daily. (Patient taking  differently: Take 100 mg by mouth daily. ) 60 tablet 5   Current Facility-Administered Medications on File Prior to Visit  Medication Dose Route Frequency Provider Last Rate Last Dose  . betamethasone acetate-betamethasone sodium phosphate (CELESTONE) injection 12 mg  12 mg Other Once Magnus Sinning, MD        Review of Systems  Constitutional: Positive for fatigue. Negative for activity change, appetite change, fever and unexpected weight change.  HENT: Negative for congestion, ear pain, rhinorrhea, sinus pressure and sore throat.   Eyes: Negative for pain, redness and visual disturbance.  Respiratory: Negative for cough, shortness of breath and wheezing.   Cardiovascular: Negative for chest pain and palpitations.  Gastrointestinal: Negative for abdominal pain, blood in stool, constipation and diarrhea.  Endocrine: Negative for polydipsia and polyuria.  Genitourinary: Negative for dysuria, frequency and urgency.  Musculoskeletal: Positive for back pain and gait problem. Negative for arthralgias and myalgias.  Skin: Negative for pallor and rash.  Allergic/Immunologic: Negative for environmental allergies.  Neurological: Negative for dizziness, syncope and headaches.  Hematological: Negative for adenopathy. Does not bruise/bleed easily.  Psychiatric/Behavioral: Negative for decreased concentration and dysphoric mood. The patient is not nervous/anxious.        Objective:   Physical Exam  Constitutional: She appears well-developed and well-nourished. No distress.  Frail appearing elderly female sitting in a wheelchair  HENT:  Head: Normocephalic and atraumatic.  Eyes: Conjunctivae and EOM are normal. Pupils are equal, round, and reactive to light. No scleral icterus.  Neck: Normal range of motion. Neck supple.  Cardiovascular: Normal rate, regular rhythm and normal heart sounds.  Pulmonary/Chest: Effort normal and breath sounds normal. No respiratory distress. She has no wheezes.    Musculoskeletal: She exhibits tenderness. She exhibits no edema.  Poor rom of LS with tenderness/sensitivity  Stays in wheelchair for exam  Per hx-able to take a few steps with walker Nl rom legs Pain on bend knee raise   Lymphadenopathy:    She has no cervical adenopathy.  Neurological: She is alert. No cranial nerve deficit.  Skin: Skin is warm and dry. No rash noted. No erythema. No pallor.  Psychiatric:  Somewhat irritable today  Close to baseline  C/o discomfort -but improved Not tearful Supportive family present           Assessment & Plan:   Problem List Items Addressed This Visit      Genitourinary   History of urinary tract infection (Chronic)    Last ua clear but with micro blood  No stone on CT ucx neg        Other   Spinal stenosis of lumbar region - Primary (Chronic)    Reviewed hospital records, lab results and studies in detail (incl CT) Rev notes from Dr Ernestina Patches  Much improved after epidural inj Given pred taper px (30 mg) to take if she feels she needs it  PT and TENS are also options  Will px tramadol (bid with caution) when she runs out the next time- I will take this over Counseled on safety/sedation/habit Muscle relaxer also prn with caution  Hopes to continue to improve  Will continue f/u with Dr Ernestina Patches Not a surg candidate due to age and chronic health problems  >25 minutes spent in face to face time with patient, >50% spent in counselling or coordination of care Including review of hx and records/disc of prednisone and side eff/ and long term plan for pain control

## 2018-01-14 NOTE — Procedures (Signed)
Mrs. Kudo is a 84 year old female with chronic history of low back and left radicular leg pain and severe stenosis multifactorial at L3-4 and L4-5.  She has been followed by Dr. Louanne Skye off and on.  We last saw her July or August 2018 completed left L3 and L4 transforaminal epidural steroid injection that seemed to have helped since November and then she just been putting it off.  The last time we completed the injection she actually said she would never have another one because of the painful part of the injection.  She returns today however with bilateral back and hip pain and pretty significant pain down the left hip and leg in a pretty classic L3 and L4 distribution.  I think the best approach is bilateral L4 transforaminal epidural steroid injection..  She has had no new falls or trauma or other issues.  She is present with her family provides some of the history.  Lumbosacral Transforaminal Epidural Steroid Injection - Sub-Pedicular Approach with Fluoroscopic Guidance  Patient: Ashlee Reyes      Date of Birth: [birth year] MRN: [id number] PCP: Abner Greenspan, MD      Visit Date: 01/02/2018   Universal Protocol:    Date/Time: 01/02/2018  Consent Given By: the patient  Position: PRONE  Additional Comments: Vital signs were monitored before and after the procedure. Patient was prepped and draped in the usual sterile fashion. The correct patient, procedure, and site was verified.   Injection Procedure Details:  Procedure Site One Meds Administered:  Meds ordered this encounter  Medications  . betamethasone acetate-betamethasone sodium phosphate (CELESTONE) injection 12 mg    Laterality: Bilateral  Location/Site:  L4-L5  Needle size: 22 G  Needle type: Spinal  Needle Placement: Transforaminal  Findings:    -Comments: Excellent flow of contrast along the nerve and into the epidural space.  Procedure Details: After squaring off the end-plates to get a true AP view,  the C-arm was positioned so that an oblique view of the foramen as noted above was visualized. The target area is just inferior to the "nose of the scotty dog" or sub pedicular. The soft tissues overlying this structure were infiltrated with 2-3 ml. of 1% Lidocaine without Epinephrine.  The spinal needle was inserted toward the target using a "trajectory" view along the fluoroscope beam.  Under AP and lateral visualization, the needle was advanced so it did not puncture dura and was located close the 6 O'Clock position of the pedical in AP tracterory. Biplanar projections were used to confirm position. Aspiration was confirmed to be negative for CSF and/or blood. A 1-2 ml. volume of Isovue-250 was injected and flow of contrast was noted at each level. Radiographs were obtained for documentation purposes.   After attaining the desired flow of contrast documented above, a 0.5 to 1.0 ml test dose of 0.25% Marcaine was injected into each respective transforaminal space.  The patient was observed for 90 seconds post injection.  After no sensory deficits were reported, and normal lower extremity motor function was noted,   the above injectate was administered so that equal amounts of the injectate were placed at each foramen (level) into the transforaminal epidural space.   Additional Comments:  The patient tolerated the procedure well Dressing: Band-Aid    Post-procedure details: Patient was observed during the procedure. Post-procedure instructions were reviewed.  Patient left the clinic in stable condition.   Pertinent Imaging: CT ABD 01/01/2018  Musculoskeletal: There is mild anterolisthesis of L4 on L5,  stable. There is also slight anterolisthesis of L3 on L4, stable. There is multifocal osteoarthritic change in the lumbar spine. There is severe spinal stenosis at L3-4 and L4-5 due to spondylolisthesis, disc protrusion and bony hypertrophy at these levels. Moderate spinal stenosis is noted  at L2-3 due to disc protrusion and bony hypertrophy. No blastic or lytic bone lesions are evident. There is no intramuscular or abdominal wall lesion.  IMPRESSION: 1. Multiple sigmoid diverticula without diverticulitis. No bowel obstruction. No abscess. No periappendiceal region inflammation.  2. Status post aorto bi-iliac stent for aneurysm. Aorta has a current maximum transverse diameter 3.0 x 2.8 cm. There is extensive aortic and iliac artery atherosclerosis. There is also coronary artery calcification. Major mesenteric vessels appear patent.  3. Severe spinal stenosis at L3-4 and L4-5, multifactorial. Moderate spinal stenosis at L2-3.

## 2018-01-14 NOTE — Patient Instructions (Signed)
See how you do - and if you want to try the prednisone taper for back pain go ahead and fill/take it   Have the pharmacy call when you need tramadol  Continue the muscle relaxer as needed  Use your walker   Follow up with Dr Ernestina Patches when you think you need a shot again

## 2018-01-15 NOTE — Telephone Encounter (Signed)
See where the prednisone goes in terms of calming things down, if tramadol plus shot is holding at 60% relief that may be best we can do. Injections every three months if beneficial would be ok

## 2018-01-15 NOTE — Telephone Encounter (Signed)
FYI: Called patient's daughter to advise. They saw the patient's PCP yesterday and she saw your note and prescribed Prednisone. They will call back if they want to try PT. She asked when you thought she could have another injection. I reiterated that you weren't sure you had much else to offer at this time as far as injections since the last one did not seem to help. Her daughter said it helped about 60% as long as she is taking tramadol, but when she stops this the pain comes back. I advised that that sounded like the relief was coming from the Tramadol and not the injection.

## 2018-01-15 NOTE — Assessment & Plan Note (Addendum)
Reviewed hospital records, lab results and studies in detail (incl CT) Rev notes from Dr Ernestina Patches  Much improved after epidural inj Given pred taper px (30 mg) to take if she feels she needs it  PT and TENS are also options  Will px tramadol (bid with caution) when she runs out the next time- I will take this over Counseled on safety/sedation/habit Muscle relaxer also prn with caution  Hopes to continue to improve  Will continue f/u with Dr Ernestina Patches Not a surg candidate due to age and chronic health problems  >25 minutes spent in face to face time with patient, >50% spent in counselling or coordination of care Including review of hx and records/disc of prednisone and side eff/ and long term plan for pain control

## 2018-01-15 NOTE — Assessment & Plan Note (Signed)
Last ua clear but with micro blood  No stone on CT ucx neg

## 2018-01-17 NOTE — Telephone Encounter (Signed)
Advised daughter and she expressed understanding.

## 2018-01-22 ENCOUNTER — Other Ambulatory Visit: Payer: Self-pay | Admitting: Family Medicine

## 2018-01-22 MED ORDER — TRAMADOL HCL 50 MG PO TABS: 50.0000 mg | ORAL_TABLET | Freq: Three times a day (TID) | ORAL | 0 refills | Status: DC | PRN

## 2018-01-22 NOTE — Telephone Encounter (Signed)
Copied from [place] [id number]. Topic: Quick Communication - Rx Refill/Question >> Jan 22, 2018 10:10 AM Corie Chiquito, [place] wrote: Medication: Tramadol 50mg    Has the patient contacted their pharmacy? No   Pharmacy called because the pt needs a refill on her Tramadol  Preferred Pharmacy (with phone number or street name):[facility] [address] [phone]   Agent: Please be advised that RX refills may take up to 3 business days. We ask that you follow-up with your pharmacy.

## 2018-01-22 NOTE — Telephone Encounter (Signed)
Will refill electronically  

## 2018-01-22 NOTE — Telephone Encounter (Signed)
Tramadol 50 mg refill Last OV: 01/14/18-noted  Last Refill:12/22/17 15 tabs/0 refills done by another provider Pharmacy:[facility] [phone]

## 2018-01-28 ENCOUNTER — Telehealth: Payer: Self-pay | Admitting: *Deleted

## 2018-01-28 NOTE — Telephone Encounter (Signed)
Copied from [place] [id number]. Topic: General - Other >> Jan 28, 2018  8:52 AM Carolyn Stare wrote: Juluis Rainier    Pt daughter [name] call she got through the prednisone    Pain level went down only taking 1 tramadol at night and  tylenol doing the day

## 2018-01-28 NOTE — Telephone Encounter (Signed)
Thanks for letting me know Keep me pasted  Glad she is not needing much tramadol

## 2018-02-20 ENCOUNTER — Emergency Department: Payer: Medicare Other

## 2018-02-20 ENCOUNTER — Other Ambulatory Visit: Payer: Self-pay

## 2018-02-20 ENCOUNTER — Emergency Department
Admission: EM | Admit: 2018-02-20 | Discharge: 2018-02-20 | Disposition: A | Discharge status: Home / Self Care | Payer: Medicare Other | Attending: Emergency Medicine | Admitting: Emergency Medicine

## 2018-02-20 DIAGNOSIS — Z7902 Long term (current) use of antithrombotics/antiplatelets: Secondary | ICD-10-CM | POA: Diagnosis not present

## 2018-02-20 DIAGNOSIS — N390 Urinary tract infection, site not specified: Secondary | ICD-10-CM | POA: Diagnosis not present

## 2018-02-20 DIAGNOSIS — Z79899 Other long term (current) drug therapy: Secondary | ICD-10-CM | POA: Diagnosis not present

## 2018-02-20 DIAGNOSIS — G44209 Tension-type headache, unspecified, not intractable: Secondary | ICD-10-CM | POA: Insufficient documentation

## 2018-02-20 DIAGNOSIS — I259 Chronic ischemic heart disease, unspecified: Secondary | ICD-10-CM | POA: Diagnosis not present

## 2018-02-20 DIAGNOSIS — J45909 Unspecified asthma, uncomplicated: Secondary | ICD-10-CM | POA: Insufficient documentation

## 2018-02-20 DIAGNOSIS — R51 Headache: Secondary | ICD-10-CM | POA: Diagnosis not present

## 2018-02-20 DIAGNOSIS — R1110 Vomiting, unspecified: Secondary | ICD-10-CM | POA: Diagnosis not present

## 2018-02-20 LAB — URINALYSIS, COMPLETE (UACMP) WITH MICROSCOPIC
Bilirubin Urine: NEGATIVE
GLUCOSE, UA: NEGATIVE mg/dL
KETONES UR: NEGATIVE mg/dL
Leukocytes, UA: NEGATIVE
Nitrite: POSITIVE — AB
Protein, ur: NEGATIVE mg/dL
Specific Gravity, Urine: 1.009 (ref 1.005–1.030)
pH: 6 (ref 5.0–8.0)

## 2018-02-20 LAB — CBC
HCT: 42.6 % (ref 35.0–47.0)
Hemoglobin: 14.1 g/dL (ref 12.0–16.0)
MCH: 30.1 pg (ref 26.0–34.0)
MCHC: 33.2 g/dL (ref 32.0–36.0)
MCV: 90.6 fL (ref 80.0–100.0)
PLATELETS: 253 10*3/uL (ref 150–440)
RBC: 4.7 MIL/uL (ref 3.80–5.20)
RDW: 14.7 % — AB (ref 11.5–14.5)
WBC: 9 10*3/uL (ref 3.6–11.0)

## 2018-02-20 LAB — LIPASE, BLOOD: Lipase: 25 U/L (ref 11–51)

## 2018-02-20 LAB — COMPREHENSIVE METABOLIC PANEL
ALK PHOS: 71 U/L (ref 38–126)
ALT: 13 U/L — AB (ref 14–54)
AST: 20 U/L (ref 15–41)
Albumin: 4.3 g/dL (ref 3.5–5.0)
Anion gap: 10 (ref 5–15)
BUN: 16 mg/dL (ref 6–20)
CALCIUM: 9.1 mg/dL (ref 8.9–10.3)
CO2: 25 mmol/L (ref 22–32)
CREATININE: 0.86 mg/dL (ref 0.44–1.00)
Chloride: 102 mmol/L (ref 101–111)
GFR, EST NON AFRICAN AMERICAN: 56 mL/min — AB (ref >60)
Glucose, Bld: 119 mg/dL — ABNORMAL HIGH (ref 65–99)
Potassium: 4 mmol/L (ref 3.5–5.1)
Sodium: 137 mmol/L (ref 135–145)
Total Bilirubin: 1.1 mg/dL (ref 0.3–1.2)
Total Protein: 7.4 g/dL (ref 6.5–8.1)

## 2018-02-20 MED ORDER — SULFAMETHOXAZOLE-TRIMETHOPRIM 800-160 MG PO TABS: 1.0000 | ORAL_TABLET | Freq: Two times a day (BID) | ORAL | 0 refills | Status: DC

## 2018-02-20 MED ORDER — METOCLOPRAMIDE HCL 10 MG PO TABS
5.0000 mg | ORAL_TABLET | Freq: Once | ORAL | Status: AC
  Administered 2018-02-20: 5 mg via ORAL
  Filled 2018-02-20: qty 1

## 2018-02-20 MED ORDER — ONDANSETRON 4 MG PO TBDP: 4.0000 mg | ORAL_TABLET | Freq: Three times a day (TID) | ORAL | 0 refills | Status: DC | PRN

## 2018-02-20 MED ORDER — FAMOTIDINE 20 MG PO TABS
20.0000 mg | ORAL_TABLET | Freq: Once | ORAL | Status: AC
  Administered 2018-02-20: 20 mg via ORAL
  Filled 2018-02-20: qty 1

## 2018-02-20 MED ORDER — ONDANSETRON 4 MG PO TBDP
ORAL_TABLET | ORAL | Status: AC
  Filled 2018-02-20: qty 1

## 2018-02-20 MED ORDER — ONDANSETRON 4 MG PO TBDP
4.0000 mg | ORAL_TABLET | Freq: Once | ORAL | Status: AC
  Administered 2018-02-20: 4 mg via ORAL

## 2018-02-20 NOTE — ED Notes (Signed)
Pt reports feeling better

## 2018-02-20 NOTE — ED Notes (Signed)
Given ginger ale for PO challenge.

## 2018-02-20 NOTE — ED Triage Notes (Signed)
Pt started with nausea and vomiting that started this morning - she is also c/o headache

## 2018-02-20 NOTE — ED Notes (Signed)
Patient transported to CT 

## 2018-02-20 NOTE — ED Notes (Signed)
Pt ambulatory to wheel chair upon discharge. Verbalized understanding of discharge instructions, follow-up care and prescriptions. VSS. Skin warm and dry. A&O x4. Pt denying any pain.

## 2018-02-20 NOTE — Discharge Instructions (Addendum)
Your urine test reveals a urinary tract infection. Take Bactrim as prescribed to treat this infection and follow up with your doctor. Your CT scan of the head was unremarkable today.

## 2018-02-20 NOTE — ED Provider Notes (Signed)
[facility] Emergency Department Provider Note  ____________________________________________  Time seen: Approximately 2:39 PM  I have reviewed the triage vital signs and the nursing notes.   HISTORY  Chief Complaint Emesis    HPI Ashlee Reyes is a 84 y.o. female who complains of a gradual onset of generalized headache described as pressure that started this morning at 10 AM. Nonradiating. No aggravating or alleviating factors. Associated with nausea and one episode of vomiting. Around noon it gradually started improving and now is minimal pressure the lateral frontal and at the bilateral ears. No vision changes or hearing changes. No paresthesias or weakness. No trauma. No fevers chills or neck pain. Pain was moderate intensity and worse. Not thunderclap.     Past Medical History:  Diagnosis Date  . AAA (abdominal aortic aneurysm) ([place])    followed by Dr Oneida Alar. Last u/s07/10 stable AAA with largest measurement 3.97 x 4.02cm.  . Allergic rhinitis, seasonal   . Asthma   . Atrial fibrillation ([place])   . CAD (coronary artery disease) 07/2009   mod by cath 08/10  . Complication of anesthesia   . Depression   . Disc disease, degenerative, lumbar or lumbosacral    SPINAL STENOSIS  . Diverticulosis   . GERD (gastroesophageal reflux disease)   . Hemorrhoids   . History of recurrent UTIs   . Hyperlipidemia   . IBS (irritable bowel syndrome)   . IC (irritable colon)    DMSO in past  . Interstitial cystitis   . Osteoarthritis    spine/spinal stenosis  . Osteopenia 2011  . PONV (postoperative nausea and vomiting)   . Skin cancer    basal cell cancer - face.     Patient Active Problem List   Diagnosis Date Noted  . History of endovascular stent graft for abdominal aortic aneurysm (AAA) 01/01/2018  . Unilateral primary osteoarthritis, left knee 07/11/2017  . Hyperlipidemia 03/12/2017  . Frequent UTI/interstitial cystitis 02/06/2016  .  Hypotension 01/09/2016  . Slurred speech 06/01/2015  . Carotid stenosis 06/01/2015  . Local reaction to immunization 02/21/2015  . Encounter for Medicare annual wellness exam 02/15/2015  . Estrogen deficiency 02/15/2015  . Sinusitis, chronic 01/12/2015  . IBS (irritable bowel syndrome) 01/12/2015  . Dizziness   . Pulsatile tinnitus of left ear 07/08/2014  . Palpitations 05/13/2014  . Diarrhea 12/23/2013  . Gastritis 10/28/2013  . Second degree heart block 10/18/2013  . Syncope 10/17/2013  . Abdominal aneurysm without mention of rupture 06/25/2013  . Osteopenia 01/13/2013  . CAD (coronary artery disease)   . Chronic depression 06/03/2007  . GERD 06/02/2007  . Asthma   . Chronic interstitial cystitis   . Osteoarthritis   . Spinal stenosis of lumbar region   . History of urinary tract infection      Past Surgical History:  Procedure Laterality Date  . ABDOMINAL AORTIC ENDOVASCULAR STENT GRAFT N/A 07/01/2013   Procedure: ABDOMINAL AORTIC ENDOVASCULAR STENT GRAFT- GORE;  Surgeon: Elam Dutch, MD;  Location: [place];  Service: Vascular;  Laterality: N/A;  Ultrasound guided  . ABDOMINAL HYSTERECTOMY     partial not cancer  . BREAST SURGERY     lumpectomy  . CYSTOCELE REPAIR    . ESOPHAGOGASTRODUODENOSCOPY  09/1998   erosive esphagitis ? stricture treated 12/2002  . HERNIA REPAIR  60/6301   umbilical hernia  . RECTOCELE REPAIR    . REFRACTIVE SURGERY     for film after cataract surgery.  Marland Kitchen RIGHT COLECTOMY  1997   benign  . SKIN LESION EXCISION  05/2011   basal cell skin lesion removed.     Prior to Admission medications   Medication Sig Start Date End Date Taking? Authorizing Provider  acetaminophen (TYLENOL) 500 MG tablet Take 500 mg by mouth every 4 (four) hours as needed for mild pain or moderate pain. For pain    [provider]  acidophilus (RISAQUAD) CAPS capsule Take 1 capsule by mouth daily.    [provider]  albuterol (PROAIR HFA) 108 (90  Base) MCG/ACT inhaler Inhale 2 puffs into the lungs every 4 (four) hours as needed for wheezing. 09/10/17   Kozlow, Donnamarie Poag, MD  beclomethasone (QVAR REDIHALER) 80 MCG/ACT inhaler Inhale 2 puffs into the lungs 2 (two) times daily. 09/10/17   Kozlow, Donnamarie Poag, MD  cetirizine (ZYRTEC) 10 MG tablet Take 1 tablet (10 mg total) by mouth daily. For allergies 09/10/17   Kozlow, Donnamarie Poag, MD  clopidogrel (PLAVIX) 75 MG tablet TAKE 1 TABLET BY MOUTH DAILY 09/17/17   Tower, Wynelle Fanny, MD  fluticasone (FLONASE) 50 MCG/ACT nasal spray Place 2 sprays into both nostrils daily. 03/07/17   Tower, Marne A, MD  LUMIGAN 0.01 % SOLN Place 1 drop into both eyes at bedtime. 12/18/17   [provider]  methocarbamol (ROBAXIN) 500 MG tablet Take 1 tablet (500 mg total) by mouth 2 (two) times daily. For back Spasm 01/01/18   Emokpae, Courage, MD  NEXIUM 40 MG capsule TAKE ONE (1) CAPSULE BY MOUTH 2 TIMES DAILY BEFORE A MEAL 10/11/17   Tower, Wynelle Fanny, MD  ondansetron (ZOFRAN ODT) 4 MG disintegrating tablet Take 1 tablet (4 mg total) by mouth every 8 (eight) hours as needed for nausea or vomiting. 02/20/18   Carrie Mew, MD  Polyethyl Glycol-Propyl Glycol (SYSTANE OP) Place 1 drop into both eyes as needed (dry eyes).     [provider]  polyethylene glycol (MIRALAX / GLYCOLAX) packet Take 17 g by mouth daily as needed. For constipation    [provider]  predniSONE (DELTASONE) 10 MG tablet Take 3 pills once daily by mouth for 3 days, then 2 pills once daily for 3 days, then 1 pill once daily for 3 days and then stop 01/14/18   Tower, Wynelle Fanny, MD  ranitidine (ZANTAC) 300 MG tablet Take 1 tablet (300 mg total) by mouth at bedtime. 09/10/17   Kozlow, Donnamarie Poag, MD  sulfamethoxazole-trimethoprim (BACTRIM DS) 800-160 MG tablet Take 1 tablet by mouth 2 (two) times daily. 02/20/18   Carrie Mew, MD  traMADol (ULTRAM) 50 MG tablet Take 1 tablet (50 mg total) by mouth every 8 (eight) hours as needed. May take with  Tylenol 500 mg each time 01/22/18   Tower, Wynelle Fanny, MD  trimethoprim (TRIMPEX) 100 MG tablet Take 1 tablet (100 mg total) by mouth 2 (two) times daily. Patient taking differently: Take 100 mg by mouth daily.  03/07/17   Tower, Wynelle Fanny, MD     Allergies Amoxicillin-pot clavulanate; Avelox [moxifloxacin hcl in nacl]; Ibuprofen; Keflex [cephalexin]; Nitrofurantoin; Omeprazole; Prevnar [pneumococcal 13-val conj vacc]; Rosuvastatin; Sertraline hcl; and Tetanus toxoids   Family History  Problem Relation Age of Onset  . Heart disease Mother        deceased age 40 heart attack.  Marland Kitchen Heart attack Mother   . Other Mother        varicose veins  . Varicose Veins Mother   . Heart disease Father  age 75 heart attack  . Cancer Father   . Stroke Brother   . Cancer Brother   . Heart disease Brother   . Heart attack Brother   . Cancer Daughter   . Diabetes Son     Social History Social History   Tobacco Use  . Smoking status: Never Smoker  . Smokeless tobacco: Never Used  Substance Use Topics  . Alcohol use: No    Alcohol/week: 0.0 oz  . Drug use: No    Review of Systems  Constitutional:   No fever or chills.  ENT:   No sore throat. No rhinorrhea. Cardiovascular:   No chest pain or syncope. Respiratory:   No dyspnea or cough. Gastrointestinal:   Negative for abdominal pain, vomiting and diarrhea.  Musculoskeletal:   Negative for focal pain or swelling All other systems reviewed and are negative except as documented above in ROS and HPI.  ____________________________________________   PHYSICAL EXAM:  VITAL SIGNS: ED Triage Vitals  Enc Vitals Group     BP 02/20/18 1234 (!) 179/106     Pulse Rate 02/20/18 1234 91     Resp 02/20/18 1234 16     Temp 02/20/18 1234 98.1 F (36.7 C)     Temp Source 02/20/18 1234 Oral     SpO2 02/20/18 1234 96 %     Weight 02/20/18 1233 163 lb (73.9 kg)     Height 02/20/18 1233 5\' 6"  (1.676 m)     Head Circumference --      Peak Flow --       Pain Score 02/20/18 1232 0     Pain Loc --      Pain Edu? --      Excl. in [place]? --     Vital signs reviewed, nursing assessments reviewed.   Constitutional:   Alert and oriented. Well appearing and in no distress. Eyes:   No scleral icterus.  EOMI. No nystagmus. No conjunctival pallor. PERRL. ENT   Head:   Normocephalic and atraumatic.   Nose:   No congestion/rhinnorhea.    Mouth/Throat:   MMM, no pharyngeal erythema. No peritonsillar mass.    Neck:   No meningismus. Full ROM. Hematological/Lymphatic/Immunilogical:   No cervical lymphadenopathy. Cardiovascular:   RRR. Symmetric bilateral radial and DP pulses.  No murmurs.  Respiratory:   Normal respiratory effort without tachypnea/retractions. Breath sounds are clear and equal bilaterally. No wheezes/rales/rhonchi. Gastrointestinal:   Soft and nontender. Non distended. There is no CVA tenderness.  No rebound, rigidity, or guarding. Genitourinary:   deferred Musculoskeletal:   Normal range of motion in all extremities. No joint effusions.  No lower extremity tenderness.  No edema. Neurologic:   Normal speech and language.  Normal cerebellar function Motor grossly intact. No acute focal neurologic deficits are appreciated.  Skin:    Skin is warm, dry and intact. No rash noted.  No petechiae, purpura, or bullae.  ____________________________________________    LABS (pertinent positives/negatives) (all labs ordered are listed, but only abnormal results are displayed) Labs Reviewed  COMPREHENSIVE METABOLIC PANEL - Abnormal; Notable for the following components:      Result Value   Glucose, Bld 119 (*)    ALT 13 (*)    GFR calc non Af Amer 56 (*)    All other components within normal limits  CBC - Abnormal; Notable for the following components:   RDW 14.7 (*)    All other components within normal limits  URINALYSIS, COMPLETE (UACMP) WITH MICROSCOPIC - Abnormal;  Notable for the following components:   Color, Urine  YELLOW (*)    APPearance HAZY (*)    Hgb urine dipstick MODERATE (*)    Nitrite POSITIVE (*)    Bacteria, UA RARE (*)    Squamous Epithelial / LPF 0-5 (*)    All other components within normal limits  URINE CULTURE  LIPASE, BLOOD   ____________________________________________   EKG    ____________________________________________    RADIOLOGY  Ct Head Wo Contrast  Result Date: 02/20/2018 CLINICAL DATA:  Pt started with nausea and vomiting that started this morning. She is also c/o headache EXAM: CT HEAD WITHOUT CONTRAST TECHNIQUE: Contiguous axial images were obtained from the base of the skull through the vertex without intravenous contrast. COMPARISON:  None. FINDINGS: Brain: No evidence of acute infarction, hemorrhage, extra-axial collection, ventriculomegaly, or mass effect. Generalized cerebral atrophy. Periventricular white matter low attenuation likely secondary to microangiopathy. Vascular: Cerebrovascular atherosclerotic calcifications are noted. Skull: Negative for fracture or focal lesion. Sinuses/Orbits: Visualized portions of the orbits are unremarkable. Visualized portions of the paranasal sinuses and mastoid air cells are unremarkable. Other: None. IMPRESSION: No acute intracranial pathology. Electronically Signed   By: Kathreen Devoid   On: 02/20/2018 14:22    ____________________________________________   PROCEDURES Procedures  ____________________________________________    CLINICAL IMPRESSION / ASSESSMENT AND PLAN / ED COURSE  Pertinent labs & imaging results that were available during my care of the patient were reviewed by me and considered in my medical decision making (see chart for details).   Patient well-appearing no acute distress neurologically intact.Considering the patient's symptoms, medical history, and physical examination today, I have low suspicion for ischemic stroke, intracranial hemorrhage, meningitis, encephalitis, carotid or vertebral  dissection, venous sinus thrombosis, MS, intracranial hypertension, glaucoma, CRAO, CRVO, or temporal arteritis.   Clinical Course as of Feb 20 1454  Thu Feb 20, 2018  1328 Feeling better, still mild nausea, mild diffuse head pressure. Low susp. Ich, meningitis. Will check CT head due to age to eval for ICH spontaneous hemorrhage. Check UA. Repeat bp 127/51.  [PS]  0947 CT head unremarkable. No acute intracranial hemorrhage.  [PS]  1452 UA nitrite positive. Will start abx, send cx.   [PS]    Clinical Course User Index [PS] Carrie Mew, MD     ____________________________________________   FINAL CLINICAL IMPRESSION(S) / ED DIAGNOSES    Final diagnoses:  Acute non intractable tension-type headache  Lower urinary tract infectious disease     ED Discharge Orders        Ordered    ondansetron (ZOFRAN ODT) 4 MG disintegrating tablet  Every 8 hours PRN     02/20/18 1439    sulfamethoxazole-trimethoprim (BACTRIM DS) 800-160 MG tablet  2 times daily     02/20/18 1454      Portions of this note were generated with dragon dictation software. Dictation errors may occur despite best attempts at proofreading.    Carrie Mew, MD 02/20/18 1455

## 2018-02-22 LAB — URINE CULTURE: Culture: >=100000 — AB

## 2018-02-23 NOTE — Progress Notes (Signed)
ED Antimicrobial Stewardship Positive Culture Follow Up   Ashlee Reyes is an 84 y.o. female who presented to [facility] on 02/20/2018 with a chief complaint of  Chief Complaint  Patient presents with  . Emesis    Recent Results (from the past 720 hour(s))  Urine Culture     Status: Abnormal   Collection Time: 02/20/18  2:29 PM  Result Value Ref Range Status   Specimen Description   Final    URINE, RANDOM Performed at [facility], [address] [place], [place] [address]    Special Requests   Final    NONE Performed at [facility], [address] [place], [place] [address]    Culture >=100,000 COLONIES/mL ESCHERICHIA COLI (A)  Final   Report Status 02/22/2018 FINAL  Final   Organism ID, Bacteria ESCHERICHIA COLI (A)  Final      Susceptibility   Escherichia coli - MIC*    AMPICILLIN 4 SENSITIVE Sensitive     CEFAZOLIN <=4 SENSITIVE Sensitive     CEFTRIAXONE <=1 SENSITIVE Sensitive     CIPROFLOXACIN <=0.25 SENSITIVE Sensitive     GENTAMICIN <=1 SENSITIVE Sensitive     IMIPENEM <=0.25 SENSITIVE Sensitive     NITROFURANTOIN <=16 SENSITIVE Sensitive     TRIMETH/SULFA >=320 RESISTANT Resistant     AMPICILLIN/SULBACTAM <=2 SENSITIVE Sensitive     PIP/TAZO <=4 SENSITIVE Sensitive     Extended ESBL NEGATIVE Sensitive     * >=100,000 COLONIES/mL ESCHERICHIA COLI    [x]  Treated with Bactrim, organism resistant to prescribed antimicrobial   New antibiotic prescription: Cefuroxime (Ceftin) 500mg  po q12h x 7 days. ER MD aware of listed allegy to cephalexin (abd pain/GI upset) and is OK with trying Ceftin, given allergy list.  ED Provider: Wendall Stade  3/10: 1st attempt:  Called patient home phone # [phone] and left a message. Also called cell phone # listed in chart which appears to be the patient's daughter Ashlee Reyes [phone] and left message.  3/10 1610 Patient's daughter called back. Will call in new prescription to [facility].  Explained to daughter that patient is to stop the Bactrim (Sulfa/Trimeth) and start cefuroxime.   Chinita Greenland PharmD Clinical Pharmacist 02/23/2018 4:16 PM

## 2018-02-23 NOTE — Progress Notes (Signed)
ED Antimicrobial Stewardship Positive Culture Follow Up   Ashlee Reyes is an 84 y.o. female who presented to [facility] on 02/20/2018 with a chief complaint of  Chief Complaint  Patient presents with  . Emesis    Recent Results (from the past 720 hour(s))  Urine Culture     Status: Abnormal   Collection Time: 02/20/18  2:29 PM  Result Value Ref Range Status   Specimen Description   Final    URINE, RANDOM Performed at [facility], [address] [place], [place] [address]    Special Requests   Final    NONE Performed at [facility], [address] [place], [place] [address]    Culture >=100,000 COLONIES/mL ESCHERICHIA COLI (A)  Final   Report Status 02/22/2018 FINAL  Final   Organism ID, Bacteria ESCHERICHIA COLI (A)  Final      Susceptibility   Escherichia coli - MIC*    AMPICILLIN 4 SENSITIVE Sensitive     CEFAZOLIN <=4 SENSITIVE Sensitive     CEFTRIAXONE <=1 SENSITIVE Sensitive     CIPROFLOXACIN <=0.25 SENSITIVE Sensitive     GENTAMICIN <=1 SENSITIVE Sensitive     IMIPENEM <=0.25 SENSITIVE Sensitive     NITROFURANTOIN <=16 SENSITIVE Sensitive     TRIMETH/SULFA >=320 RESISTANT Resistant     AMPICILLIN/SULBACTAM <=2 SENSITIVE Sensitive     PIP/TAZO <=4 SENSITIVE Sensitive     Extended ESBL NEGATIVE Sensitive     * >=100,000 COLONIES/mL ESCHERICHIA COLI    [x]  Treated with Bactrim, organism resistant to prescribed antimicrobial   New antibiotic prescription: Cefuroxime (Ceftin) 500mg  po q12h x 7 days. ER MD aware of listed allegy to cephalexin (abd pain/GI upset) and is OK with trying Ceftin, given allergy list.  ED Provider: Wendall Stade  3/10: 1st attempt:  Called patient home phone # [phone] and left a message. Also called cell phone # listed in chart which appears to be the patient's daughter [name] [phone] and left message.   Chinita Greenland PharmD Clinical Pharmacist 02/23/2018 4:01 PM

## 2018-02-28 ENCOUNTER — Encounter: Payer: Self-pay | Admitting: Family Medicine

## 2018-02-28 ENCOUNTER — Ambulatory Visit (INDEPENDENT_AMBULATORY_CARE_PROVIDER_SITE_OTHER): Payer: Medicare Other | Admitting: Family Medicine

## 2018-02-28 VITALS — BP 126/72 | HR 85 | Temp 98.1°F | Ht 66.5 in | Wt 166.5 lb

## 2018-02-28 DIAGNOSIS — R112 Nausea with vomiting, unspecified: Secondary | ICD-10-CM | POA: Insufficient documentation

## 2018-02-28 DIAGNOSIS — I714 Abdominal aortic aneurysm, without rupture, unspecified: Secondary | ICD-10-CM

## 2018-02-28 DIAGNOSIS — K581 Irritable bowel syndrome with constipation: Secondary | ICD-10-CM

## 2018-02-28 DIAGNOSIS — R519 Headache, unspecified: Secondary | ICD-10-CM | POA: Insufficient documentation

## 2018-02-28 DIAGNOSIS — G44209 Tension-type headache, unspecified, not intractable: Secondary | ICD-10-CM | POA: Diagnosis not present

## 2018-02-28 DIAGNOSIS — Z87440 Personal history of urinary (tract) infections: Secondary | ICD-10-CM | POA: Diagnosis not present

## 2018-02-28 DIAGNOSIS — R51 Headache: Secondary | ICD-10-CM

## 2018-02-28 NOTE — Patient Instructions (Signed)
I think you are having some GI side effects from the antibiotic You can go ahead and stop it (you have taken enough days)  Make sure to drink fluids miralax is ok - MOM is ok if that does not help  Continue the probiotic   If nausea and GI upset do not improve over the weekend let me know If nausea or headache worsen -please let us know   I think the melatonin is ok to take for sleep when you need it

## 2018-02-28 NOTE — Progress Notes (Signed)
Subjective:    Patient ID: Ashlee Reyes, female    DOB: Aug 13, 1921, 84 y.o.   MRN: [id number]  HPI Here for f/u of ED visit 02/20/18  Presented with headache (gradual onset) , then n/v (in the car) No other neuro symptoms  bp was high at 179/106 at presentation with pulse of 91 and nl pulse ox   Wt Readings from Last 3 Encounters:  02/28/18 166 lb 8 oz (75.5 kg)  02/20/18 163 lb (73.9 kg)  09/10/17 174 lb 12.8 oz (79.3 kg)   Had reassuring exam   Lab Results  Component Value Date   CREATININE 0.86 02/20/2018   BUN 16 02/20/2018   NA 137 02/20/2018   K 4.0 02/20/2018   CL 102 02/20/2018   CO2 25 02/20/2018   Lab Results  Component Value Date   WBC 9.0 02/20/2018   HGB 14.1 02/20/2018   HCT 42.6 02/20/2018   MCV 90.6 02/20/2018   PLT 253 02/20/2018     Ct Head Wo Contrast  Result Date: 02/20/2018 CLINICAL DATA:  Pt started with nausea and vomiting that started this morning. She is also c/o headache EXAM: CT HEAD WITHOUT CONTRAST TECHNIQUE: Contiguous axial images were obtained from the base of the skull through the vertex without intravenous contrast. COMPARISON:  None. FINDINGS: Brain: No evidence of acute infarction, hemorrhage, extra-axial collection, ventriculomegaly, or mass effect. Generalized cerebral atrophy. Periventricular white matter low attenuation likely secondary to microangiopathy. Vascular: Cerebrovascular atherosclerotic calcifications are noted. Skull: Negative for fracture or focal lesion. Sinuses/Orbits: Visualized portions of the orbits are unremarkable. Visualized portions of the paranasal sinuses and mastoid air cells are unremarkable. Other: None. IMPRESSION: No acute intracranial pathology. Electronically Signed   By: Kathreen Devoid   On: 02/20/2018 14:22   UA had pos nitrite/ mod Hb and rare bacteria  Did start abx for presumed uti (bactrim) Given zofran for nausea  She felt better in the ED and was d/c home   A/p as follows from ED  provider: Patient well-appearing no acute distress neurologically intact.Considering the patient's symptoms, medical history, and physical examination today, I have low suspicion for ischemic stroke, intracranial hemorrhage, meningitis, encephalitis, carotid or vertebral dissection, venous sinus thrombosis, MS, intracranial hypertension, glaucoma, CRAO, CRVO, or temporal arteritis.  Urine cx returned reststant to sulfa and her abx was changed to ceftin  uti is improved now   Still nauseated (on and off) -given zofran this am  bp was up and down yesterday BP Readings from Last 3 Encounters:  02/28/18 126/72  02/20/18 117/67  01/14/18 108/62   bp was high yesterday at home   Her low abdomen feels "burning"  Light if any headache   Has not been able to sleep  Does not know why  Perhaps due to pain  When she takes tramadol -sleeps well  Also tried a 5 mg melatonin   No urinary symptoms   Constipation last week- MOM yet  No diarrhea   No appetite  Is making herself eat   Patient Active Problem List   Diagnosis Date Noted  . Headache 02/28/2018  . Nausea & vomiting 02/28/2018  . History of endovascular stent graft for abdominal aortic aneurysm (AAA) 01/01/2018  . Unilateral primary osteoarthritis, left knee 07/11/2017  . Hyperlipidemia 03/12/2017  . Frequent UTI/interstitial cystitis 02/06/2016  . Hypotension 01/09/2016  . Slurred speech 06/01/2015  . Carotid stenosis 06/01/2015  . Local reaction to immunization 02/21/2015  . Encounter for Medicare annual wellness exam  02/15/2015  . Estrogen deficiency 02/15/2015  . Sinusitis, chronic 01/12/2015  . IBS (irritable bowel syndrome) 01/12/2015  . Dizziness   . Pulsatile tinnitus of left ear 07/08/2014  . Palpitations 05/13/2014  . Diarrhea 12/23/2013  . Gastritis 10/28/2013  . Second degree heart block 10/18/2013  . Syncope 10/17/2013  . Abdominal aneurysm without mention of rupture 06/25/2013  . Osteopenia 01/13/2013    . CAD (coronary artery disease)   . Chronic depression 06/03/2007  . GERD 06/02/2007  . Asthma   . Chronic interstitial cystitis   . Osteoarthritis   . Spinal stenosis of lumbar region   . History of urinary tract infection    Past Medical History:  Diagnosis Date  . AAA (abdominal aortic aneurysm) ([place])    followed by Dr Oneida Alar. Last u/s07/10 stable AAA with largest measurement 3.97 x 4.02cm.  . Allergic rhinitis, seasonal   . Asthma   . Atrial fibrillation ([place])   . CAD (coronary artery disease) 07/2009   mod by cath 08/10  . Complication of anesthesia   . Depression   . Disc disease, degenerative, lumbar or lumbosacral    SPINAL STENOSIS  . Diverticulosis   . GERD (gastroesophageal reflux disease)   . Hemorrhoids   . History of recurrent UTIs   . Hyperlipidemia   . IBS (irritable bowel syndrome)   . IC (irritable colon)    DMSO in past  . Interstitial cystitis   . Osteoarthritis    spine/spinal stenosis  . Osteopenia 2011  . PONV (postoperative nausea and vomiting)   . Skin cancer    basal cell cancer - face.   Past Surgical History:  Procedure Laterality Date  . ABDOMINAL AORTIC ENDOVASCULAR STENT GRAFT N/A 07/01/2013   Procedure: ABDOMINAL AORTIC ENDOVASCULAR STENT GRAFT- GORE;  Surgeon: Elam Dutch, MD;  Location: [facility];  Service: Vascular;  Laterality: N/A;  Ultrasound guided  . ABDOMINAL HYSTERECTOMY     partial not cancer  . BREAST SURGERY     lumpectomy  . CYSTOCELE REPAIR    . ESOPHAGOGASTRODUODENOSCOPY  09/1998   erosive esphagitis ? stricture treated 12/2002  . HERNIA REPAIR  16/1096   umbilical hernia  . RECTOCELE REPAIR    . REFRACTIVE SURGERY     for film after cataract surgery.  Marland Kitchen RIGHT COLECTOMY  1997   benign  . SKIN LESION EXCISION  05/2011   basal cell skin lesion removed.   Social History   Tobacco Use  . Smoking status: Never Smoker  . Smokeless tobacco: Never Used  Substance Use Topics  . Alcohol use: No    Alcohol/week:  0.0 oz  . Drug use: No   Family History  Problem Relation Age of Onset  . Heart disease Mother        deceased age 32 heart attack.  Marland Kitchen Heart attack Mother   . Other Mother        varicose veins  . Varicose Veins Mother   . Heart disease Father        age 77 heart attack  . Cancer Father   . Stroke Brother   . Cancer Brother   . Heart disease Brother   . Heart attack Brother   . Cancer Daughter   . Diabetes Son    Allergies  Allergen Reactions  . Amoxicillin-Pot Clavulanate     REACTION: sick  . Avelox [Moxifloxacin Hcl In Nacl] Nausea Only  . Ibuprofen     REACTION: GI  . Keflex [Cephalexin]  abd pain / GI upset  . Nitrofurantoin Other (See Comments)    Chest pain or indigestion with nausea  . Omeprazole Nausea Only  . Prevnar [Pneumococcal 13-Val Conj Vacc] Itching    Severe local reaction with redness and itching   . Rosuvastatin     REACTION: foot pain  . Sertraline Hcl     REACTION: more depressed  . Tetanus Toxoids     Local redness and swelling    Current Outpatient Medications on File Prior to Visit  Medication Sig Dispense Refill  . acetaminophen (TYLENOL) 500 MG tablet Take 500 mg by mouth every 4 (four) hours as needed for mild pain or moderate pain. For pain    . acidophilus (RISAQUAD) CAPS capsule Take 1 capsule by mouth daily.    Marland Kitchen albuterol (PROAIR HFA) 108 (90 Base) MCG/ACT inhaler Inhale 2 puffs into the lungs every 4 (four) hours as needed for wheezing. 1 Inhaler 2  . beclomethasone (QVAR REDIHALER) 80 MCG/ACT inhaler Inhale 2 puffs into the lungs 2 (two) times daily. 1 Inhaler 5  . cefUROXime (CEFTIN) 500 MG tablet Take 1 tablet by mouth 2 (two) times daily.  0  . cetirizine (ZYRTEC) 10 MG tablet Take 1 tablet (10 mg total) by mouth daily. For allergies 30 tablet 5  . clopidogrel (PLAVIX) 75 MG tablet TAKE 1 TABLET BY MOUTH DAILY 30 tablet 11  . fluticasone (FLONASE) 50 MCG/ACT nasal spray Place 2 sprays into both nostrils daily. 16 g 5  .  LUMIGAN 0.01 % SOLN Place 1 drop into both eyes at bedtime.    . methocarbamol (ROBAXIN) 500 MG tablet Take 1 tablet (500 mg total) by mouth 2 (two) times daily. For back Spasm 60 tablet 0  . NEXIUM 40 MG capsule TAKE ONE (1) CAPSULE BY MOUTH 2 TIMES DAILY BEFORE A MEAL 60 capsule 3  . ondansetron (ZOFRAN ODT) 4 MG disintegrating tablet Take 1 tablet (4 mg total) by mouth every 8 (eight) hours as needed for nausea or vomiting. 20 tablet 0  . Polyethyl Glycol-Propyl Glycol (SYSTANE OP) Place 1 drop into both eyes as needed (dry eyes).     . polyethylene glycol (MIRALAX / GLYCOLAX) packet Take 17 g by mouth daily as needed. For constipation    . ranitidine (ZANTAC) 300 MG tablet Take 1 tablet (300 mg total) by mouth at bedtime. 30 tablet 5  . traMADol (ULTRAM) 50 MG tablet Take 1 tablet (50 mg total) by mouth every 8 (eight) hours as needed. May take with Tylenol 500 mg each time 30 tablet 0  . trimethoprim (TRIMPEX) 100 MG tablet Take 1 tablet (100 mg total) by mouth 2 (two) times daily. (Patient not taking: Reported on 02/28/2018) 60 tablet 5   Current Facility-Administered Medications on File Prior to Visit  Medication Dose Route Frequency Provider Last Rate Last Dose  . betamethasone acetate-betamethasone sodium phosphate (CELESTONE) injection 12 mg  12 mg Other Once Magnus Sinning, MD        Review of Systems  Constitutional: Positive for fatigue. Negative for activity change, appetite change, fever and unexpected weight change.  HENT: Negative for congestion, ear pain, rhinorrhea, sinus pressure and sore throat.   Eyes: Negative for pain, redness and visual disturbance.  Respiratory: Negative for cough, shortness of breath and wheezing.   Cardiovascular: Negative for chest pain and palpitations.  Gastrointestinal: Positive for rectal pain. Negative for abdominal distention, abdominal pain, blood in stool, constipation, diarrhea and vomiting.  Dyspepsia Epigastric burning   Endocrine:  Negative for polydipsia and polyuria.  Genitourinary: Positive for frequency. Negative for difficulty urinating, dysuria, hematuria, pelvic pain and urgency.  Musculoskeletal: Negative for arthralgias, back pain and myalgias.  Skin: Negative for pallor and rash.  Allergic/Immunologic: Negative for environmental allergies.  Neurological: Negative for dizziness, syncope and headaches.  Hematological: Negative for adenopathy. Does not bruise/bleed easily.  Psychiatric/Behavioral: Negative for decreased concentration and dysphoric mood. The patient is not nervous/anxious.        Objective:   Physical Exam  Constitutional: She appears well-developed and well-nourished. No distress.  Frail appearing elderly female  Seems mildly fatigued  HENT:  Head: Normocephalic and atraumatic.  Eyes: Conjunctivae and EOM are normal. Pupils are equal, round, and reactive to light.  Neck: Normal range of motion. Neck supple.  Cardiovascular: Normal rate, regular rhythm and normal heart sounds.  Pulmonary/Chest: Effort normal and breath sounds normal.  Abdominal: Soft. Bowel sounds are normal. She exhibits no distension, no abdominal bruit and no pulsatile midline mass. There is tenderness in the right lower quadrant, epigastric area and left lower quadrant. There is no rigidity, no rebound, no guarding, no tenderness at McBurney's point and negative Murphy's sign.  No cva tenderness  Mild bilat LQ tenderness and epigastric  No rebound or guarding  Musculoskeletal: She exhibits no edema.  Lymphadenopathy:    She has no cervical adenopathy.  Neurological: She is alert. No cranial nerve deficit. She exhibits normal muscle tone. Coordination normal.  Skin: No rash noted. No pallor.  Psychiatric: She has a normal mood and affect.          Assessment & Plan:   Problem List Items Addressed This Visit      Cardiovascular and Mediastinum   Aneurysm of abdominal vessel (HCC) (Chronic)    obs No  symptoms or changes         Digestive   IBS (irritable bowel syndrome)    Worse with current abx Suspect will improve when done with ceftin  Continue probiotic      Nausea & vomiting    With uti  Vomiting is resolved Still nauseated-likely from the abx /ceftin  zofran prn  If not imp after ceftin is done will update         Genitourinary   History of urinary tract infection - Primary (Chronic)    Recently tx with ceftin (which is giving her GI side effects) On trimethoprim for prophylaxis (this uti was resistant) Enc fluids Had had 6 d -will stop ceftin today  Alert if no improvement in GI symptoms or return of urinary symptoms Reviewed hospital records, lab results and studies in detail         Relevant Medications   cefUROXime (CEFTIN) 500 MG tablet     Other   RESOLVED: Headache    She had this with elevated bp when seen in ED for uti  Now resolved  Reviewed hospital records, lab results and studies in detail         Other Visit Diagnoses    Abdominal aortic aneurysm (AAA) without rupture ([place])   (Chronic)

## 2018-03-02 NOTE — Assessment & Plan Note (Signed)
She had this with elevated bp when seen in ED for uti  Now resolved  Reviewed hospital records, lab results and studies in detail

## 2018-03-02 NOTE — Assessment & Plan Note (Signed)
obs No symptoms or changes

## 2018-03-02 NOTE — Assessment & Plan Note (Signed)
Recently tx with ceftin (which is giving her GI side effects) On trimethoprim for prophylaxis (this uti was resistant) Enc fluids Had had 6 d -will stop ceftin today  Alert if no improvement in GI symptoms or return of urinary symptoms Reviewed hospital records, lab results and studies in detail

## 2018-03-02 NOTE — Assessment & Plan Note (Signed)
Worse with current abx Suspect will improve when done with ceftin  Continue probiotic

## 2018-03-02 NOTE — Assessment & Plan Note (Signed)
With uti  Vomiting is resolved Still nauseated-likely from the abx /ceftin  zofran prn  If not imp after ceftin is done will update

## 2018-03-03 ENCOUNTER — Other Ambulatory Visit: Payer: Self-pay | Admitting: *Deleted

## 2018-03-03 MED ORDER — NEXIUM 40 MG PO CPDR: DELAYED_RELEASE_CAPSULE | ORAL | 3 refills | Status: DC

## 2018-03-04 ENCOUNTER — Telehealth: Payer: Self-pay | Admitting: Family Medicine

## 2018-03-04 NOTE — Telephone Encounter (Signed)
Copied from [place] [id number]. Topic: General - Other >> Mar 04, 2018 10:34 AM Darl Householder, RMA wrote: Reason for CRM: patient is needing a prior authorization for NEXIUM 40 MG capsule, please call [facility] [phone]

## 2018-03-06 NOTE — Telephone Encounter (Signed)
PA was approved until 03/07/2019, [name] (daugher) notified and pharmacy also advised, I will place the approval letter in Dr. Marliss Coots inbox once I get it

## 2018-03-11 ENCOUNTER — Telehealth (INDEPENDENT_AMBULATORY_CARE_PROVIDER_SITE_OTHER): Payer: Self-pay | Admitting: Physical Medicine and Rehabilitation

## 2018-03-11 NOTE — Telephone Encounter (Signed)
ok 

## 2018-03-12 ENCOUNTER — Telehealth (INDEPENDENT_AMBULATORY_CARE_PROVIDER_SITE_OTHER): Payer: Self-pay | Admitting: Physical Medicine and Rehabilitation

## 2018-03-12 ENCOUNTER — Encounter: Payer: Self-pay | Admitting: Family Medicine

## 2018-03-12 NOTE — Telephone Encounter (Signed)
Received verbal ok for patient to hold Plavix for 7 days prior to [facility].

## 2018-03-12 NOTE — Telephone Encounter (Signed)
Called [name] and she advise me that she didn't call us and that it must have been the actual doc office that called [place] and not her.   Called [facility] and spoke to Dr. Romona Curls assistant and advise her of Dr. Marliss Coots comments

## 2018-03-12 NOTE — Telephone Encounter (Signed)
That is ok - there is always increased cardiovascular risk off of it so please start back as soon as it is safe  I hope the injection helps!

## 2018-03-12 NOTE — Telephone Encounter (Signed)
Patient is taking Plavix. Will send message to Dr. Glori Bickers to get ok for patient to hold prior to injection.

## 2018-03-13 NOTE — Telephone Encounter (Signed)
Scheduled for 03/31/18 at 1000. Will hold Plavix for 7 days prior.

## 2018-03-19 DIAGNOSIS — H401133 Primary open-angle glaucoma, bilateral, severe stage: Secondary | ICD-10-CM | POA: Diagnosis not present

## 2018-03-31 ENCOUNTER — Ambulatory Visit (INDEPENDENT_AMBULATORY_CARE_PROVIDER_SITE_OTHER): Payer: Medicare Other

## 2018-03-31 ENCOUNTER — Other Ambulatory Visit: Payer: Self-pay | Admitting: Family Medicine

## 2018-03-31 ENCOUNTER — Ambulatory Visit (INDEPENDENT_AMBULATORY_CARE_PROVIDER_SITE_OTHER): Payer: Medicare Other | Admitting: Physical Medicine and Rehabilitation

## 2018-03-31 ENCOUNTER — Encounter (INDEPENDENT_AMBULATORY_CARE_PROVIDER_SITE_OTHER): Payer: Self-pay | Admitting: Physical Medicine and Rehabilitation

## 2018-03-31 VITALS — BP 105/65 | HR 87 | Temp 98.5°F

## 2018-03-31 DIAGNOSIS — M48062 Spinal stenosis, lumbar region with neurogenic claudication: Secondary | ICD-10-CM

## 2018-03-31 DIAGNOSIS — M5416 Radiculopathy, lumbar region: Secondary | ICD-10-CM | POA: Diagnosis not present

## 2018-03-31 MED ORDER — BETAMETHASONE SOD PHOS & ACET 6 (3-3) MG/ML IJ SUSP
12.0000 mg | Freq: Once | INTRAMUSCULAR | Status: AC
  Administered 2018-03-31: 12 mg

## 2018-03-31 NOTE — Patient Instructions (Signed)

## 2018-03-31 NOTE — Telephone Encounter (Signed)
Will refill electronically  

## 2018-03-31 NOTE — Telephone Encounter (Signed)
Last f/u was 02/28/18, last filled on 01/22/18 #30 tabs with 0 refills

## 2018-03-31 NOTE — Progress Notes (Signed)
Vern Prestia - 84 y.o. female MRN [id number]  Date of birth: [birth year]  Office Visit Note: Visit Date: 03/31/2018 PCP: Abner Greenspan, MD Referred by: Tower, Wynelle Fanny, MD  Subjective: Chief Complaint  Patient presents with  . Lower Back - Pain   HPI: Mrs. Alanis is a 84 year old female that is accompanied by her daughter who provides much of the history.  The patient was last seen in January and we completed transforaminal epidural steroid injection with good relief.  She reports worsening left radicular leg pain more than L4 and L5 distribution.  This is worse with standing and ambulating.  Last advanced imaging we have is a CT of the abdomen and pelvis in the first part of this year shows severe multifactorial stenosis at L3-4 and L4-5.  Her pain really is mostly left-sided has returned recently.  She is having more difficulty trying to stand and ambulate and she does try to exercise.  We are going to complete a left L4 transforaminal epidural steroid injection.  She has been off her Plavix for almost 8 days.  She will return to taking that.   ROS Otherwise per HPI.  Assessment & Plan: Visit Diagnoses:  1. Lumbar radiculopathy   2. Spinal stenosis of lumbar region with neurogenic claudication     Plan: No additional findings.   Meds & Orders:  Meds ordered this encounter  Medications  . betamethasone acetate-betamethasone sodium phosphate (CELESTONE) injection 12 mg    Orders Placed This Encounter  Procedures  . XR C-ARM NO REPORT  . Epidural Steroid injection    Follow-up: Return if symptoms worsen or fail to improve.   Procedures: No procedures performed  Lumbosacral Transforaminal Epidural Steroid Injection - Sub-Pedicular Approach with Fluoroscopic Guidance  Patient: Tiona Ruane      Date of Birth: Jan 04, 1921 MRN: [id number] PCP: Abner Greenspan, MD      Visit Date: 03/31/2018   Universal Protocol:    Date/Time: 03/31/2018  Consent Given By: the  patient  Position: PRONE  Additional Comments: Vital signs were monitored before and after the procedure. Patient was prepped and draped in the usual sterile fashion. The correct patient, procedure, and site was verified.   Injection Procedure Details:  Procedure Site One Meds Administered:  Meds ordered this encounter  Medications  . betamethasone acetate-betamethasone sodium phosphate (CELESTONE) injection 12 mg    Laterality: Left  Location/Site:  L4-L5  Needle size: 22 G  Needle type: Spinal  Needle Placement: Transforaminal  Findings:    -Comments: Excellent flow of contrast along the nerve and into the epidural space.  Procedure Details: After squaring off the end-plates to get a true AP view, the C-arm was positioned so that an oblique view of the foramen as noted above was visualized. The target area is just inferior to the "nose of the scotty dog" or sub pedicular. The soft tissues overlying this structure were infiltrated with 2-3 ml. of 1% Lidocaine without Epinephrine.  The spinal needle was inserted toward the target using a "trajectory" view along the fluoroscope beam.  Under AP and lateral visualization, the needle was advanced so it did not puncture dura and was located close the 6 O'Clock position of the pedical in AP tracterory. Biplanar projections were used to confirm position. Aspiration was confirmed to be negative for CSF and/or blood. A 1-2 ml. volume of Isovue-250 was injected and flow of contrast was noted at each level. Radiographs were obtained for documentation purposes.  After attaining the desired flow of contrast documented above, a 0.5 to 1.0 ml test dose of 0.25% Marcaine was injected into each respective transforaminal space.  The patient was observed for 90 seconds post injection.  After no sensory deficits were reported, and normal lower extremity motor function was noted,   the above injectate was administered so that equal amounts of the  injectate were placed at each foramen (level) into the transforaminal epidural space.   Additional Comments:  The patient tolerated the procedure well Dressing: Band-Aid    Post-procedure details: Patient was observed during the procedure. Post-procedure instructions were reviewed.  Patient left the clinic in stable condition.    Clinical History: CT ABD 01/01/2018  Musculoskeletal: There is mild anterolisthesis of L4 on L5, stable. There is also slight anterolisthesis of L3 on L4, stable. There is multifocal osteoarthritic change in the lumbar spine. There is severe spinal stenosis at L3-4 and L4-5 due to spondylolisthesis, disc protrusion and bony hypertrophy at these levels. Moderate spinal stenosis is noted at L2-3 due to disc protrusion and bony hypertrophy. No blastic or lytic bone lesions are evident. There is no intramuscular or abdominal wall lesion.  IMPRESSION: 1. Multiple sigmoid diverticula without diverticulitis. No bowel obstruction. No abscess. No periappendiceal region inflammation.  2. Status post aorto bi-iliac stent for aneurysm. Aorta has a current maximum transverse diameter 3.0 x 2.8 cm. There is extensive aortic and iliac artery atherosclerosis. There is also coronary artery calcification. Major mesenteric vessels appear patent.  3. Severe spinal stenosis at L3-4 and L4-5, multifactorial. Moderate spinal stenosis at L2-3.   She reports that she has never smoked. She has never used smokeless tobacco. No results for input(s): HGBA1C, LABURIC in the last 8760 hours.  Objective:  VS:  HT:    WT:   BMI:     BP:105/65  HR:87bpm  TEMP:98.5 F (36.9 C)(Oral)  RESP:98 % Physical Exam  Musculoskeletal:  Patient was sitting in a wheelchair today for ease of movement.  She has 5 out of 5 strength in hip flexion as well as knee flexion and extension and dorsiflexion and plantar flexion.  Neurological: She exhibits normal muscle tone. Coordination  normal.    Ortho Exam Imaging: Xr C-arm No Report  Result Date: 03/31/2018 Please see Notes or Procedures tab for imaging impression.   Past Medical/Family/Surgical/Social History: Medications & Allergies reviewed per EMR, new medications updated. Patient Active Problem List   Diagnosis Date Noted  . Nausea & vomiting 02/28/2018  . History of endovascular stent graft for abdominal aortic aneurysm (AAA) 01/01/2018  . Unilateral primary osteoarthritis, left knee 07/11/2017  . Hyperlipidemia 03/12/2017  . Frequent UTI/interstitial cystitis 02/06/2016  . Hypotension 01/09/2016  . Slurred speech 06/01/2015  . Carotid stenosis 06/01/2015  . Local reaction to immunization 02/21/2015  . Encounter for Medicare annual wellness exam 02/15/2015  . Estrogen deficiency 02/15/2015  . Sinusitis, chronic 01/12/2015  . IBS (irritable bowel syndrome) 01/12/2015  . Dizziness   . Pulsatile tinnitus of left ear 07/08/2014  . Palpitations 05/13/2014  . Diarrhea 12/23/2013  . Gastritis 10/28/2013  . Second degree heart block 10/18/2013  . Syncope 10/17/2013  . Aneurysm of abdominal vessel ([place]) 06/25/2013  . Osteopenia 01/13/2013  . CAD (coronary artery disease)   . Chronic depression 06/03/2007  . GERD 06/02/2007  . Asthma   . Chronic interstitial cystitis   . Osteoarthritis   . Spinal stenosis of lumbar region   . History of urinary tract infection  Past Medical History:  Diagnosis Date  . AAA (abdominal aortic aneurysm) ([place])    followed by Dr Oneida Alar. Last u/s07/10 stable AAA with largest measurement 3.97 x 4.02cm.  . Allergic rhinitis, seasonal   . Asthma   . Atrial fibrillation ([place])   . CAD (coronary artery disease) 07/2009   mod by cath 08/10  . Complication of anesthesia   . Depression   . Disc disease, degenerative, lumbar or lumbosacral    SPINAL STENOSIS  . Diverticulosis   . GERD (gastroesophageal reflux disease)   . Hemorrhoids   . History of recurrent UTIs   .  Hyperlipidemia   . IBS (irritable bowel syndrome)   . IC (irritable colon)    DMSO in past  . Interstitial cystitis   . Osteoarthritis    spine/spinal stenosis  . Osteopenia 2011  . PONV (postoperative nausea and vomiting)   . Skin cancer    basal cell cancer - face.   Family History  Problem Relation Age of Onset  . Heart disease Mother        deceased age 31 heart attack.  Marland Kitchen Heart attack Mother   . Other Mother        varicose veins  . Varicose Veins Mother   . Heart disease Father        age 4 heart attack  . Cancer Father   . Stroke Brother   . Cancer Brother   . Heart disease Brother   . Heart attack Brother   . Cancer Daughter   . Diabetes Son    Past Surgical History:  Procedure Laterality Date  . ABDOMINAL AORTIC ENDOVASCULAR STENT GRAFT N/A 07/01/2013   Procedure: ABDOMINAL AORTIC ENDOVASCULAR STENT GRAFT- GORE;  Surgeon: Elam Dutch, MD;  Location: [facility];  Service: Vascular;  Laterality: N/A;  Ultrasound guided  . ABDOMINAL HYSTERECTOMY     partial not cancer  . BREAST SURGERY     lumpectomy  . CYSTOCELE REPAIR    . ESOPHAGOGASTRODUODENOSCOPY  09/1998   erosive esphagitis ? stricture treated 12/2002  . HERNIA REPAIR  32/3557   umbilical hernia  . RECTOCELE REPAIR    . REFRACTIVE SURGERY     for film after cataract surgery.  Marland Kitchen RIGHT COLECTOMY  1997   benign  . SKIN LESION EXCISION  05/2011   basal cell skin lesion removed.   Social History   Occupational History  . Not on file  Tobacco Use  . Smoking status: Never Smoker  . Smokeless tobacco: Never Used  Substance and Sexual Activity  . Alcohol use: No    Alcohol/week: 0.0 oz  . Drug use: No  . Sexual activity: Never

## 2018-03-31 NOTE — Progress Notes (Signed)
.  Numeric Pain Rating Scale and Functional Assessment Average Pain 8   In the last MONTH (on 0-10 scale) has pain interfered with the following?  1. General activity like being  able to carry out your everyday physical activities such as walking, climbing stairs, carrying groceries, or moving a chair?  Rating(7)   +Driver, -BT(pt stopped plavix 8-10 days ago according to daughter), -Dye Allergies.

## 2018-03-31 NOTE — Procedures (Signed)
Lumbosacral Transforaminal Epidural Steroid Injection - Sub-Pedicular Approach with Fluoroscopic Guidance  Patient: Ashlee Reyes      Date of Birth: 27-Jan-1921 MRN: [id number] PCP: Abner Greenspan, MD      Visit Date: 03/31/2018   Universal Protocol:    Date/Time: 03/31/2018  Consent Given By: the patient  Position: PRONE  Additional Comments: Vital signs were monitored before and after the procedure. Patient was prepped and draped in the usual sterile fashion. The correct patient, procedure, and site was verified.   Injection Procedure Details:  Procedure Site One Meds Administered:  Meds ordered this encounter  Medications  . betamethasone acetate-betamethasone sodium phosphate (CELESTONE) injection 12 mg    Laterality: Left  Location/Site:  L4-L5  Needle size: 22 G  Needle type: Spinal  Needle Placement: Transforaminal  Findings:    -Comments: Excellent flow of contrast along the nerve and into the epidural space.  Procedure Details: After squaring off the end-plates to get a true AP view, the C-arm was positioned so that an oblique view of the foramen as noted above was visualized. The target area is just inferior to the "nose of the scotty dog" or sub pedicular. The soft tissues overlying this structure were infiltrated with 2-3 ml. of 1% Lidocaine without Epinephrine.  The spinal needle was inserted toward the target using a "trajectory" view along the fluoroscope beam.  Under AP and lateral visualization, the needle was advanced so it did not puncture dura and was located close the 6 O'Clock position of the pedical in AP tracterory. Biplanar projections were used to confirm position. Aspiration was confirmed to be negative for CSF and/or blood. A 1-2 ml. volume of Isovue-250 was injected and flow of contrast was noted at each level. Radiographs were obtained for documentation purposes.   After attaining the desired flow of contrast documented above, a 0.5  to 1.0 ml test dose of 0.25% Marcaine was injected into each respective transforaminal space.  The patient was observed for 90 seconds post injection.  After no sensory deficits were reported, and normal lower extremity motor function was noted,   the above injectate was administered so that equal amounts of the injectate were placed at each foramen (level) into the transforaminal epidural space.   Additional Comments:  The patient tolerated the procedure well Dressing: Band-Aid    Post-procedure details: Patient was observed during the procedure. Post-procedure instructions were reviewed.  Patient left the clinic in stable condition.

## 2018-04-15 DIAGNOSIS — H353131 Nonexudative age-related macular degeneration, bilateral, early dry stage: Secondary | ICD-10-CM | POA: Diagnosis not present

## 2018-05-08 ENCOUNTER — Other Ambulatory Visit: Payer: Self-pay | Admitting: Allergy and Immunology

## 2018-06-02 ENCOUNTER — Other Ambulatory Visit: Payer: Self-pay | Admitting: Family Medicine

## 2018-06-02 NOTE — Telephone Encounter (Signed)
CPE is on 07/14/18, last filled on 03/31/18 #30 tabs with 0 refills, please advise

## 2018-06-03 ENCOUNTER — Ambulatory Visit: Payer: Medicare Other

## 2018-06-10 ENCOUNTER — Other Ambulatory Visit: Payer: Medicare Other

## 2018-06-10 ENCOUNTER — Telehealth: Payer: Self-pay | Admitting: Family Medicine

## 2018-06-10 ENCOUNTER — Ambulatory Visit (INDEPENDENT_AMBULATORY_CARE_PROVIDER_SITE_OTHER): Payer: Medicare Other | Admitting: Allergy and Immunology

## 2018-06-10 ENCOUNTER — Encounter: Payer: Self-pay | Admitting: Allergy and Immunology

## 2018-06-10 VITALS — BP 130/80 | HR 84 | Temp 98.2°F | Resp 20

## 2018-06-10 DIAGNOSIS — N3001 Acute cystitis with hematuria: Secondary | ICD-10-CM | POA: Diagnosis not present

## 2018-06-10 DIAGNOSIS — K219 Gastro-esophageal reflux disease without esophagitis: Secondary | ICD-10-CM | POA: Diagnosis not present

## 2018-06-10 DIAGNOSIS — J3089 Other allergic rhinitis: Secondary | ICD-10-CM

## 2018-06-10 DIAGNOSIS — J4530 Mild persistent asthma, uncomplicated: Secondary | ICD-10-CM

## 2018-06-10 DIAGNOSIS — Z87440 Personal history of urinary (tract) infections: Secondary | ICD-10-CM

## 2018-06-10 MED ORDER — CETIRIZINE HCL 10 MG PO TABS: ORAL_TABLET | ORAL | 5 refills | Status: DC

## 2018-06-10 NOTE — Progress Notes (Signed)
Follow-up Note  Referring Provider: Abner Greenspan, MD Primary Provider: Abner Greenspan, MD Date of Office Visit: 06/10/2018  Subjective:   Ashlee Reyes (DOB: March 04, 1921) is a 84 y.o. female who returns to the [facility] on 06/10/2018 in re-evaluation of the following:  HPI: Ashlee Reyes presents to this clinic in evaluation of respiratory tract problems.  I last saw her in this clinic on 10 September 2017 at which point in time she appeared to have a flare of her asthma and the provoking factor for that event may have been a respiratory tract infection.  She has done relatively well while consistently using anti-inflammatory agents for both her upper and lower airway and treating her reflux currently with a plan of Flonase once a day, Qvar once a day, and Nexium once a day.  Sometime over the course of the past month or so she has had a little bit more problem with nasal congestion and intermittent headaches and maxillary sinus fullness and throat clearing for which she has been using some Vicks salve in her nose but these complaints appear to be chronic in nature and although they may be somewhat worse over the course of the past few months they have have been present for at least the past year.  As well, there has been a little bit more issue with confusion and fatigue and a lot more of an issue with throat clearing and postnasal drip over the course of the past 3 weeks.  There has not been any fever or ugly nasal discharge or inability to smell or coughing or wheezing or chest pain.  There has not been any issues with shortness of breath or leg swelling.  Allergies as of 06/10/2018      Reactions   Amoxicillin-pot Clavulanate    REACTION: sick   Avelox [moxifloxacin Hcl In Nacl] Nausea Only   Ibuprofen    REACTION: GI   Keflex [cephalexin]    abd pain / GI upset   Nitrofurantoin Other (See Comments)   Chest pain or indigestion with nausea   Omeprazole Nausea  Only   Prevnar [pneumococcal 13-val Conj Vacc] Itching   Severe local reaction with redness and itching    Rosuvastatin    REACTION: foot pain   Sertraline Hcl    REACTION: more depressed   Tetanus Toxoids    Local redness and swelling       Medication List      acetaminophen 500 MG tablet Commonly known as:  TYLENOL Take 500 mg by mouth every 4 (four) hours as needed for mild pain or moderate pain. For pain   acidophilus Caps capsule Take 1 capsule by mouth daily.   albuterol 108 (90 Base) MCG/ACT inhaler Commonly known as:  PROAIR HFA Inhale 2 puffs into the lungs every 4 (four) hours as needed for wheezing.   beclomethasone 80 MCG/ACT inhaler Commonly known as:  QVAR REDIHALER Inhale 2 puffs into the lungs 2 (two) times daily.   cetirizine 10 MG tablet Commonly known as:  ZYRTEC TAKE 1 TABLET BY MOUTH DAILY AS NEEDED FOR ALLERGIES   clopidogrel 75 MG tablet Commonly known as:  PLAVIX TAKE 1 TABLET BY MOUTH DAILY   fluticasone 50 MCG/ACT nasal spray Commonly known as:  FLONASE Place 2 sprays into both nostrils daily.   LUMIGAN 0.01 % Soln Generic drug:  bimatoprost Place 1 drop into both eyes at bedtime.   methocarbamol 500 MG tablet Commonly known as:  ROBAXIN Take 1 tablet (500 mg total) by mouth 2 (two) times daily. For back Spasm   NEXIUM 40 MG capsule Generic drug:  esomeprazole TAKE ONE (1) CAPSULE BY MOUTH 2 TIMES DAILY BEFORE A MEAL   ondansetron 4 MG disintegrating tablet Commonly known as:  ZOFRAN ODT Take 1 tablet (4 mg total) by mouth every 8 (eight) hours as needed for nausea or vomiting.   polyethylene glycol packet Commonly known as:  MIRALAX / GLYCOLAX Take 17 g by mouth daily as needed. For constipation   ranitidine 300 MG tablet Commonly known as:  ZANTAC Take 1 tablet (300 mg total) by mouth at bedtime.   SYSTANE OP Place 1 drop into both eyes as needed (dry eyes).   traMADol 50 MG tablet Commonly known as:  ULTRAM TAKE 1  TABLET(50 MG) BY MOUTH EVERY 8 HOURS AS NEEDED. MAY TAKE WITH TYLENOL 500 MG EACH TIME   trimethoprim 100 MG tablet Commonly known as:  TRIMPEX Take 1 tablet (100 mg total) by mouth 2 (two) times daily.       Past Medical History:  Diagnosis Date  . AAA (abdominal aortic aneurysm) ([place])    followed by Dr Oneida Alar. Last u/s07/10 stable AAA with largest measurement 3.97 x 4.02cm.  . Allergic rhinitis, seasonal   . Asthma   . Atrial fibrillation ([place])   . CAD (coronary artery disease) 07/2009   mod by cath 08/10  . Complication of anesthesia   . Depression   . Disc disease, degenerative, lumbar or lumbosacral    SPINAL STENOSIS  . Diverticulosis   . GERD (gastroesophageal reflux disease)   . Hemorrhoids   . History of recurrent UTIs   . Hyperlipidemia   . IBS (irritable bowel syndrome)   . IC (irritable colon)    DMSO in past  . Interstitial cystitis   . Osteoarthritis    spine/spinal stenosis  . Osteopenia 2011  . PONV (postoperative nausea and vomiting)   . Skin cancer    basal cell cancer - face.    Past Surgical History:  Procedure Laterality Date  . ABDOMINAL AORTIC ENDOVASCULAR STENT GRAFT N/A 07/01/2013   Procedure: ABDOMINAL AORTIC ENDOVASCULAR STENT GRAFT- GORE;  Surgeon: Elam Dutch, MD;  Location: [facility];  Service: Vascular;  Laterality: N/A;  Ultrasound guided  . ABDOMINAL HYSTERECTOMY     partial not cancer  . BREAST SURGERY     lumpectomy  . CYSTOCELE REPAIR    . ESOPHAGOGASTRODUODENOSCOPY  09/1998   erosive esphagitis ? stricture treated 12/2002  . HERNIA REPAIR  60/7371   umbilical hernia  . RECTOCELE REPAIR    . REFRACTIVE SURGERY     for film after cataract surgery.  Marland Kitchen RIGHT COLECTOMY  1997   benign  . SKIN LESION EXCISION  05/2011   basal cell skin lesion removed.    Review of systems negative except as noted in HPI / PMHx or noted below:  Review of Systems  Constitutional: Negative.   HENT: Negative.   Eyes: Negative.   Respiratory:  Negative.   Cardiovascular: Negative.   Gastrointestinal: Negative.   Genitourinary: Negative.   Musculoskeletal: Negative.   Skin: Negative.   Neurological: Negative.   Endo/Heme/Allergies: Negative.   Psychiatric/Behavioral: Negative.      Objective:   Vitals:   06/10/18 1052  BP: 130/80  Pulse: 84  Resp: 20  Temp: 98.2 F (36.8 C)          Physical Exam  Constitutional:  Wheelchair, holding  head in hands  HENT:  Head: Normocephalic.  Right Ear: External ear normal.  Left Ear: External ear normal.  Nose: Nose normal. No mucosal edema or rhinorrhea.  Mouth/Throat: Uvula is midline, oropharynx is clear and moist and mucous membranes are normal. No oropharyngeal exudate.  Hearing aids  Eyes: Conjunctivae are normal.  Neck: Trachea normal. No tracheal tenderness present. No tracheal deviation present. No thyromegaly present.  Cardiovascular: Normal rate, regular rhythm, S1 normal, S2 normal and normal heart sounds.  No murmur heard. Pulmonary/Chest: Breath sounds normal. No stridor. No respiratory distress. She has no wheezes. She has no rales.  Musculoskeletal: She exhibits no edema.  Lymphadenopathy:       Head (right side): No tonsillar adenopathy present.       Head (left side): No tonsillar adenopathy present.    She has no cervical adenopathy.  Neurological: She is alert.  Skin: No rash noted. She is not diaphoretic. No erythema. Nails show no clubbing.    Diagnostics: none  Assessment and Plan:   1. Asthma, well controlled, mild persistent   2. Other allergic rhinitis   3. LPRD (laryngopharyngeal reflux disease)     1. Continue Qvar 80 Redihaler - 2 inhalations one time per day  2. Continue Nexium 40 - 1 tablet one time per day in AM  3. Continue Cetirizine 10mg  - 1 tablet one time per day  4. Continue Flonase - 1 spray each nostril one time per day  5. Start Ranitidine 300mg  - 1 tablet one time per day in evening  6. If needed:    A. ProAir  HFA or similar 2 inhalations every 4-6 hours  B. Mucinex DM 1-2 tablets twic a day  C. Nasal saline multiple times per day  7. Visit with primary care doctor about confusion  8. Return in 12 weeks or earlier if problem  Other than having Ashlee Reyes continue to use anti-inflammatory agents for her respiratory track and being a little more aggressive about treating her LPR I am not going to change much of her medical therapy.  I would like for her to be evaluated by her primary care doctor as there may be a problem with more confusion lately and she has had a history of recurrent urinary tract infections presenting as confusion in the past.  If she does well I will see her back in this clinic in 12 weeks or earlier if there is a problem.  Allena Katz, MD Allergy / Immunology [place]

## 2018-06-10 NOTE — Patient Instructions (Addendum)
  1. Continue Qvar 80 Redihaler - 2 inhalations one time per day  2. Continue Nexium 40 - 1 tablet one time per day in AM  3. Continue Cetirizine 10mg  - 1 tablet one time per day  4. Continue Flonase - 1 spray each nostril one time per day  5. Start Ranitidine 300mg  - 1 tablet one time per day in evening  6. If needed:    A. ProAir HFA or similar 2 inhalations every 4-6 hours  B. Mucinex DM 1-2 tablets twic a day  C. Nasal saline multiple times per day  7. Visit with primary care doctor about confusion  8. Return in 12 weeks or earlier if problem

## 2018-06-10 NOTE — Telephone Encounter (Signed)
Copied from [facility] [id number]. Topic: Quick Communication - See Telephone Encounter >> Jun 10, 2018  9:38 AM Bea Graff, NT wrote: CRM for notification. See Telephone encounter for: 06/10/18. Pts daughter [name] calling and states she is unable to bring her mom in for an appointment and wants to see if she can bring in a urine sample to see if her mom has a UTI. Please advise.

## 2018-06-10 NOTE — Telephone Encounter (Signed)
I am out of the office and getting off the computer for the day.  She has hx of chronic/freq uti and would be comfortable getting a urine culture- but if symptoms are too severe to wait then she needs to be seen.  Thanks

## 2018-06-10 NOTE — Telephone Encounter (Signed)
Daughter will bring urine sample for cx today but if sxs worsen she will make an appt with a provider here at the office

## 2018-06-11 ENCOUNTER — Encounter: Payer: Self-pay | Admitting: Allergy and Immunology

## 2018-06-12 LAB — URINE CULTURE
MICRO NUMBER:: 90757652
SPECIMEN QUALITY:: ADEQUATE

## 2018-06-13 ENCOUNTER — Encounter: Payer: Self-pay | Admitting: *Deleted

## 2018-06-13 ENCOUNTER — Telehealth: Payer: Self-pay | Admitting: *Deleted

## 2018-06-13 ENCOUNTER — Ambulatory Visit: Payer: Medicare Other | Admitting: Internal Medicine

## 2018-06-13 MED ORDER — CIPROFLOXACIN HCL 250 MG PO TABS: 250.0000 mg | ORAL_TABLET | Freq: Two times a day (BID) | ORAL | 0 refills | Status: DC

## 2018-06-13 NOTE — Telephone Encounter (Signed)
Pt's daughter [name] notified of urine cx results and Dr. Marliss Coots comments. Pt has tried Cipro in the past and it did upset her stomach but daughter wants to try it because pt is feeling so bad. Rx sent to pharmacy and [name] advise to keep [place] updated

## 2018-06-13 NOTE — Telephone Encounter (Signed)
-----   Message from Abner Greenspan, MD sent at 06/13/2018 10:32 AM EDT ----- Urine cx confirms uti  It is unfortunately resistant to bactrim/septra -which is one of the only drug classes she can take  Please ask if she can take cipro ?   (avelox is listed as causing nausea- that is in the same class)  If she can take it please send in cipro 250 mg 1 po bid for 5 d #10 no refills -take with food and let me know how it goes

## 2018-07-06 ENCOUNTER — Telehealth: Payer: Self-pay | Admitting: Family Medicine

## 2018-07-06 DIAGNOSIS — K2970 Gastritis, unspecified, without bleeding: Secondary | ICD-10-CM

## 2018-07-06 DIAGNOSIS — E7800 Pure hypercholesterolemia, unspecified: Secondary | ICD-10-CM

## 2018-07-06 DIAGNOSIS — I2510 Atherosclerotic heart disease of native coronary artery without angina pectoris: Secondary | ICD-10-CM

## 2018-07-06 DIAGNOSIS — R002 Palpitations: Secondary | ICD-10-CM

## 2018-07-06 NOTE — Telephone Encounter (Signed)
-----   Message from Eustace Pen, LPN sent at 04/05/9143  3:29 PM EDT ----- Regarding: Labs  Lab orders needed. Thank you.  Insurance:  [employer]

## 2018-07-08 ENCOUNTER — Ambulatory Visit (INDEPENDENT_AMBULATORY_CARE_PROVIDER_SITE_OTHER): Payer: Medicare Other

## 2018-07-08 VITALS — BP 140/76 | HR 72 | Temp 98.4°F | Ht 64.0 in | Wt 171.5 lb

## 2018-07-08 DIAGNOSIS — K2970 Gastritis, unspecified, without bleeding: Secondary | ICD-10-CM

## 2018-07-08 DIAGNOSIS — Z0000 Encounter for general adult medical examination without abnormal findings: Secondary | ICD-10-CM

## 2018-07-08 DIAGNOSIS — I2510 Atherosclerotic heart disease of native coronary artery without angina pectoris: Secondary | ICD-10-CM | POA: Diagnosis not present

## 2018-07-08 DIAGNOSIS — R8290 Unspecified abnormal findings in urine: Secondary | ICD-10-CM | POA: Diagnosis not present

## 2018-07-08 DIAGNOSIS — E7800 Pure hypercholesterolemia, unspecified: Secondary | ICD-10-CM | POA: Diagnosis not present

## 2018-07-08 DIAGNOSIS — R350 Frequency of micturition: Secondary | ICD-10-CM

## 2018-07-08 DIAGNOSIS — R002 Palpitations: Secondary | ICD-10-CM | POA: Diagnosis not present

## 2018-07-08 LAB — POC URINALSYSI DIPSTICK (AUTOMATED)
Bilirubin, UA: NEGATIVE
Glucose, UA: NEGATIVE
KETONES UA: NEGATIVE
LEUKOCYTES UA: NEGATIVE
NITRITE UA: POSITIVE
PH UA: 6 (ref 5.0–8.0)
PROTEIN UA: NEGATIVE
Spec Grav, UA: 1.015 (ref 1.010–1.025)
UROBILINOGEN UA: 0.2 U/dL

## 2018-07-08 LAB — CBC WITH DIFFERENTIAL/PLATELET
BASOS PCT: 1 % (ref 0.0–3.0)
Basophils Absolute: 0.1 10*3/uL (ref 0.0–0.1)
EOS ABS: 0.1 10*3/uL (ref 0.0–0.7)
EOS PCT: 1.1 % (ref 0.0–5.0)
HEMATOCRIT: 39.2 % (ref 36.0–46.0)
HEMOGLOBIN: 13 g/dL (ref 12.0–15.0)
Lymphocytes Relative: 22.7 % (ref 12.0–46.0)
Lymphs Abs: 1.5 10*3/uL (ref 0.7–4.0)
MCHC: 33.2 g/dL (ref 30.0–36.0)
MCV: 90 fl (ref 78.0–100.0)
Monocytes Absolute: 0.4 10*3/uL (ref 0.1–1.0)
Monocytes Relative: 6.7 % (ref 3.0–12.0)
NEUTROS ABS: 4.4 10*3/uL (ref 1.4–7.7)
NEUTROS PCT: 68.5 % (ref 43.0–77.0)
Platelets: 218 10*3/uL (ref 150.0–400.0)
RBC: 4.36 Mil/uL (ref 3.87–5.11)
RDW: 14.6 % (ref 11.5–15.5)
WBC: 6.4 10*3/uL (ref 4.0–10.5)

## 2018-07-08 LAB — TSH: TSH: 2.25 u[IU]/mL (ref 0.35–4.50)

## 2018-07-08 LAB — COMPREHENSIVE METABOLIC PANEL
ALBUMIN: 3.7 g/dL (ref 3.5–5.2)
ALT: 13 U/L (ref 0–35)
AST: 13 U/L (ref 0–37)
Alkaline Phosphatase: 59 U/L (ref 39–117)
BUN: 14 mg/dL (ref 6–23)
CALCIUM: 9 mg/dL (ref 8.4–10.5)
CHLORIDE: 106 meq/L (ref 96–112)
CO2: 29 meq/L (ref 19–32)
CREATININE: 0.93 mg/dL (ref 0.40–1.20)
GFR: 59.55 mL/min — ABNORMAL LOW (ref >60.00)
Glucose, Bld: 91 mg/dL (ref 70–99)
POTASSIUM: 4.2 meq/L (ref 3.5–5.1)
SODIUM: 141 meq/L (ref 135–145)
Total Bilirubin: 0.6 mg/dL (ref 0.2–1.2)
Total Protein: 6.7 g/dL (ref 6.0–8.3)

## 2018-07-08 LAB — LIPID PANEL
CHOL/HDL RATIO: 4
CHOLESTEROL: 240 mg/dL — AB (ref 0–200)
HDL: 54.3 mg/dL (ref >39.00)
LDL CALC: 164 mg/dL — AB (ref 0–99)
NonHDL: 185.79
Triglycerides: 110 mg/dL (ref 0.0–149.0)
VLDL: 22 mg/dL (ref 0.0–40.0)

## 2018-07-08 NOTE — Progress Notes (Signed)
Subjective:   Ashlee Reyes is a 84 y.o. female who presents for Medicare Annual (Subsequent) preventive examination.  Review of Systems:  N/A Cardiac Risk Factors include: advanced age (>61men, >58 women);dyslipidemia     Objective:     Vitals: BP 140/76 (BP Location: Right Arm, Patient Position: Sitting, Cuff Size: Normal)   Pulse 72   Temp 98.4 F (36.9 C) (Oral)   Ht 5\' 4"  (1.626 m) Comment: shoes  Wt 171 lb 8 oz (77.8 kg)   LMP 12/17/1968   SpO2 95%   BMI 29.44 kg/m   Body mass index is 29.44 kg/m.  Advanced Directives 07/08/2018 02/20/2018 01/01/2018 09/05/2017 08/22/2017 03/07/2017 02/14/2017  Does Patient Have a Medical Advance Directive? Yes No (No Data) Yes Yes Yes Yes  Type of Paramedic of [place];Living will - - [place];Living will [place];Living will [place];Living will [place]  Does patient want to make changes to medical advance directive? - - - - - - -  Copy of [place] in Chart? Yes - - - No - copy requested No - copy requested -  Would patient like information on creating a medical advance directive? - No - Patient declined - - - - -    Tobacco Social History   Tobacco Use  Smoking Status Never Smoker  Smokeless Tobacco Never Used     Counseling given: No   Clinical Intake:  Pre-visit preparation completed: Yes  Pain Score: 1 (lower back)     Nutritional Status: BMI 25 -29 Overweight Nutritional Risks: None Diabetes: No  How often do you need to have someone help you when you read instructions, pamphlets, or other written materials from your doctor or pharmacy?: 2 - Rarely What is the last grade level you completed in school?: 8th grade  Interpreter Needed?: No  Comments: pt is a widow and lives with daughter [name] Information entered by :: [employer], LPN  Past Medical History:  Diagnosis Date  . AAA  (abdominal aortic aneurysm) ([place])    followed by Dr Oneida Alar. Last u/s07/10 stable AAA with largest measurement 3.97 x 4.02cm.  . Allergic rhinitis, seasonal   . Asthma   . Atrial fibrillation ([place])   . CAD (coronary artery disease) 07/2009   mod by cath 08/10  . Complication of anesthesia   . Depression   . Disc disease, degenerative, lumbar or lumbosacral    SPINAL STENOSIS  . Diverticulosis   . GERD (gastroesophageal reflux disease)   . Hemorrhoids   . History of recurrent UTIs   . Hyperlipidemia   . IBS (irritable bowel syndrome)   . IC (irritable colon)    DMSO in past  . Interstitial cystitis   . Osteoarthritis    spine/spinal stenosis  . Osteopenia 2011  . PONV (postoperative nausea and vomiting)   . Skin cancer    basal cell cancer - face.   Past Surgical History:  Procedure Laterality Date  . ABDOMINAL AORTIC ENDOVASCULAR STENT GRAFT N/A 07/01/2013   Procedure: ABDOMINAL AORTIC ENDOVASCULAR STENT GRAFT- GORE;  Surgeon: Elam Dutch, MD;  Location: [facility];  Service: Vascular;  Laterality: N/A;  Ultrasound guided  . ABDOMINAL HYSTERECTOMY     partial not cancer  . BREAST SURGERY     lumpectomy  . CYSTOCELE REPAIR    . ESOPHAGOGASTRODUODENOSCOPY  09/1998   erosive esphagitis ? stricture treated 12/2002  . HERNIA REPAIR  11/1997  umbilical hernia  . RECTOCELE REPAIR    . REFRACTIVE SURGERY     for film after cataract surgery.  Marland Kitchen RIGHT COLECTOMY  1997   benign  . SKIN LESION EXCISION  05/2011   basal cell skin lesion removed.   Family History  Problem Relation Age of Onset  . Heart disease Mother        deceased age 67 heart attack.  Marland Kitchen Heart attack Mother   . Other Mother        varicose veins  . Varicose Veins Mother   . Heart disease Father        age 17 heart attack  . Cancer Father   . Stroke Brother   . Cancer Brother   . Heart disease Brother   . Heart attack Brother   . Cancer Daughter   . Diabetes Son    Social History   Socioeconomic  History  . Marital status: Widowed    Spouse name: Not on file  . Number of children: Not on file  . Years of education: Not on file  . Highest education level: Not on file  Occupational History  . Not on file  Social Needs  . Financial resource strain: Not on file  . Food insecurity:    Worry: Not on file    Inability: Not on file  . Transportation needs:    Medical: Not on file    Non-medical: Not on file  Tobacco Use  . Smoking status: Never Smoker  . Smokeless tobacco: Never Used  Substance and Sexual Activity  . Alcohol use: No    Alcohol/week: 0.0 oz  . Drug use: No  . Sexual activity: Not Currently  Lifestyle  . Physical activity:    Days per week: Not on file    Minutes per session: Not on file  . Stress: Not on file  Relationships  . Social connections:    Talks on phone: Not on file    Gets together: Not on file    Attends religious service: Not on file    Active member of club or organization: Not on file    Attends meetings of clubs or organizations: Not on file    Relationship status: Not on file  Other Topics Concern  . Not on file  Social History Narrative  . Not on file    Outpatient Encounter Medications as of 07/08/2018  Medication Sig  . acetaminophen (TYLENOL) 500 MG tablet Take 500 mg by mouth every 4 (four) hours as needed for mild pain or moderate pain. For pain  . acidophilus (RISAQUAD) CAPS capsule Take 1 capsule by mouth daily.  Marland Kitchen albuterol (PROAIR HFA) 108 (90 Base) MCG/ACT inhaler Inhale 2 puffs into the lungs every 4 (four) hours as needed for wheezing.  . beclomethasone (QVAR REDIHALER) 80 MCG/ACT inhaler Inhale 2 puffs into the lungs 2 (two) times daily.  . cetirizine (ZYRTEC) 10 MG tablet TAKE 1 TABLET BY MOUTH DAILY AS NEEDED FOR ALLERGIES  . Cholecalciferol (VITAMIN D PO) Take 1 capsule by mouth at bedtime.  . clopidogrel (PLAVIX) 75 MG tablet TAKE 1 TABLET BY MOUTH DAILY  . fluticasone (FLONASE) 50 MCG/ACT nasal spray Place 2  sprays into both nostrils daily.  Marland Kitchen LUMIGAN 0.01 % SOLN Place 1 drop into both eyes at bedtime.  . methocarbamol (ROBAXIN) 500 MG tablet Take 1 tablet (500 mg total) by mouth 2 (two) times daily. For back Spasm (Patient taking differently: Take 500 mg by mouth as  needed. For back Spasm)  . NEXIUM 40 MG capsule TAKE ONE (1) CAPSULE BY MOUTH 2 TIMES DAILY BEFORE A MEAL  . ondansetron (ZOFRAN ODT) 4 MG disintegrating tablet Take 1 tablet (4 mg total) by mouth every 8 (eight) hours as needed for nausea or vomiting.  [name] Glycol-Propyl Glycol (SYSTANE OP) Place 1 drop into both eyes as needed (dry eyes).   . polyethylene glycol (MIRALAX / GLYCOLAX) packet Take 17 g by mouth daily as needed. For constipation  . ranitidine (ZANTAC) 300 MG tablet Take 1 tablet (300 mg total) by mouth at bedtime.  . traMADol (ULTRAM) 50 MG tablet TAKE 1 TABLET(50 MG) BY MOUTH EVERY 8 HOURS AS NEEDED. MAY TAKE WITH TYLENOL 500 MG EACH TIME  . trimethoprim (TRIMPEX) 100 MG tablet Take 1 tablet (100 mg total) by mouth 2 (two) times daily.  . [DISCONTINUED] cefUROXime (CEFTIN) 500 MG tablet Take 1 tablet by mouth 2 (two) times daily.  . [DISCONTINUED] ciprofloxacin (CIPRO) 250 MG tablet Take 1 tablet (250 mg total) by mouth 2 (two) times daily with a meal.   No facility-administered encounter medications on file as of 07/08/2018.     Activities of Daily Living In your present state of health, do you have any difficulty performing the following activities: 07/08/2018  Hearing? Y  Vision? N  Difficulty concentrating or making decisions? Y  Walking or climbing stairs? Y  Dressing or bathing? N  Doing errands, shopping? Y  Preparing Food and eating ? N  Using the Toilet? N  In the past six months, have you accidently leaked urine? Y  Do you have problems with loss of bowel control? N  Managing your Medications? Y  Managing your Finances? Y  Housekeeping or managing your Housekeeping? Y  Some recent data might be  hidden    Patient Care Team: Tower, Wynelle Fanny, MD as PCP - General Kozlow, Donnamarie Poag, MD as Consulting Physician (Allergy and Immunology) Bjorn Loser, MD as Consulting Physician (Urology) Rolm Bookbinder, MD as Consulting Physician (Dermatology) Elam Dutch, MD as Consulting Physician (Vascular Surgery) Sater, Nanine Means, MD as Consulting Physician (Neurology) Clarene Essex, MD as Consulting Physician (Gastroenterology) Alfonse Ras May, MD as Referring Physician (Audiology) Marygrace Drought, MD as Consulting Physician (Ophthalmology)    Assessment:   This is a routine wellness examination for Flordia Kassem.  Hearing Screening Comments: Bilateral hearing aids; last exam on 04/29/18 Vision Screening Comments: Vision exam in April 2019 with Dr. Satira Sark   Exercise Activities and Dietary recommendations Current Exercise Habits: The patient does not participate in regular exercise at present, Exercise limited by: orthopedic condition(s)  Goals    . Patient Stated     Starting 07/08/2018, I will continue to take medications as prescribed.        Fall Risk Fall Risk  07/08/2018 03/07/2017 03/05/2016 02/15/2015 01/13/2013  Falls in the past year? No No No No No   Depression Screen PHQ 2/9 Scores 07/08/2018 03/07/2017 03/05/2016 02/15/2015  PHQ - 2 Score 1 1 0 0  PHQ- 9 Score 1 - - -     Cognitive Function MMSE - Mini Mental State Exam 07/08/2018 03/07/2017 03/05/2016  Orientation to time 3 5 5   Orientation to time comments unable to specify year - -  Orientation to Place 5 5 5   Registration 3 3 3   Attention/ Calculation 0 0 5  Recall 1 0 3  Recall-comments unable to recall 2 of 3 words pt was unable to recall 3 of  3 words -  Language- name 2 objects 0 0 0  Language- repeat 1 1 0  Language- follow 3 step command 3 2 3   Language- follow 3 step command-comments - pt was unable to follow 1 step of 3 step command -  Language- read & follow direction 0 0 1  Write a sentence 0 0 0  Copy  design 0 0 0  Total score 16 16 25    PLEASE NOTE: A Mini-Cog screen was completed. Maximum score is 20. A value of 0 denotes this part of [place] MMSE was not completed or the patient failed this part of the Mini-Cog screening.   Mini-Cog Screening Orientation to Time - Max 5 pts Orientation to Place - Max 5 pts Registration - Max 3 pts Recall - Max 3 pts Language Repeat - Max 1 pts Language Follow 3 Step Command - Max 3 pts     Immunization History  Administered Date(s) Administered  . Influenza Split 09/17/2012  . Influenza Whole 10/02/2005, 09/18/2007, 09/14/2008, 09/14/2009, 12/13/2010  . Influenza,inj,Quad PF,6+ Mos 09/30/2013, 02/15/2015, 03/05/2016, 11/02/2016  . Pneumococcal Conjugate-13 02/15/2015  . Td 07/29/2012    Screening Tests Health Maintenance  Topic Date Due  . MAMMOGRAM  02/14/2021 (Originally 10/23/2003)  . PNA vac Low Risk Adult (2 of 2 - PPSV23) 02/14/2021 (Originally 02/15/2016)  . INFLUENZA VACCINE  07/17/2018  . TETANUS/TDAP  07/29/2022  . DEXA SCAN  Completed       Plan:     I have personally reviewed, addressed, and noted the following in the patient's chart:  A. Medical and social history B. Use of alcohol, tobacco or illicit drugs  C. Current medications and supplements D. Functional ability and status E.  Nutritional status F.  Physical activity G. Advance directives H. List of other physicians I.  Hospitalizations, surgeries, and ER visits in previous 12 months J.  [place] to include hearing, vision, cognitive, depression L. Referrals and appointments - none  In addition, I have reviewed and discussed with patient certain preventive protocols, quality metrics, and best practice recommendations. A written personalized care plan for preventive services as well as general preventive health recommendations were provided to patient.  See attached scanned questionnaire for additional information.   Signed,   Lindell Noe,  MHA, BS, LPN Health Coach

## 2018-07-08 NOTE — Progress Notes (Signed)
PCP notes:   Health maintenance:  No gaps identified.  Abnormal screenings:   Depression score: 1 Depression screen [facility] 2/9 07/08/2018 03/07/2017 03/05/2016 02/15/2015 01/13/2013  Decreased Interest 0 0 0 0 0  Down, Depressed, Hopeless 1 1 0 0 0  PHQ - 2 Score 1 1 0 0 0  Altered sleeping 0 - - - -  Tired, decreased energy 0 - - - -  Change in appetite 0 - - - -  Feeling bad or failure about yourself  0 - - - -  Trouble concentrating 0 - - - -  Moving slowly or fidgety/restless 0 - - - -  Suicidal thoughts 0 - - - -  PHQ-9 Score 1 - - - -  Difficult doing work/chores Not difficult at all - - - -   Mini-Cog score: 16/20 MMSE - Mini Mental State Exam 07/08/2018 03/07/2017 03/05/2016  Orientation to time 3 5 5   Orientation to time comments unable to specify year - -  Orientation to Place 5 5 5   Registration 3 3 3   Attention/ Calculation 0 0 5  Recall 1 0 3  Recall-comments unable to recall 2 of 3 words pt was unable to recall 3 of 3 words -  Language- name 2 objects 0 0 0  Language- repeat 1 1 0  Language- follow 3 step command 3 2 3   Language- follow 3 step command-comments - pt was unable to follow 1 step of 3 step command -  Language- read & follow direction 0 0 1  Write a sentence 0 0 0  Copy design 0 0 0  Total score 16 16 25     Patient concerns:   Daughter is concerned patient has recurrent UTI. PCP notified. UA and C&S ordered.   Nurse concerns:  None  Next PCP appt:   07/14/18 @ 1130  I reviewed health advisor's note, was available for consultation, and agree with documentation and plan. Loura Pardon MD

## 2018-07-08 NOTE — Patient Instructions (Signed)
Ashlee Reyes , Thank you for taking time to come for your Medicare Wellness Visit. I appreciate your ongoing commitment to your health goals. Please review the following plan we discussed and let me know if I can assist you in the future.   These are the goals we discussed: Goals    . Patient Stated     Starting 07/08/2018, I will continue to take medications as prescribed.        This is a list of the screening recommended for you and due dates:  Health Maintenance  Topic Date Due  . Mammogram  02/14/2021*  . Pneumonia vaccines (2 of 2 - PPSV23) 02/14/2021*  . Flu Shot  07/17/2018  . Tetanus Vaccine  07/29/2022  . DEXA scan (bone density measurement)  Completed  *Topic was postponed. The date shown is not the original due date.   Preventive Care for Adults  A healthy lifestyle and preventive care can promote health and wellness. Preventive health guidelines for adults include the following key practices.  . A routine yearly physical is a good way to check with your health care provider about your health and preventive screening. It is a chance to share any concerns and updates on your health and to receive a thorough exam.  . Visit your dentist for a routine exam and preventive care every 6 months. Brush your teeth twice a day and floss once a day. Good oral hygiene prevents tooth decay and gum disease.  . The frequency of eye exams is based on your age, health, family medical history, use  of contact lenses, and other factors. Follow your health care provider's recommendations for frequency of eye exams.  . Eat a healthy diet. Foods like vegetables, fruits, whole grains, low-fat dairy products, and lean protein foods contain the nutrients you need without too many calories. Decrease your intake of foods high in solid fats, added sugars, and salt. Eat the right amount of calories for you. Get information about a proper diet from your health care provider, if necessary.  . Regular  physical exercise is one of the most important things you can do for your health. Most adults should get at least 150 minutes of moderate-intensity exercise (any activity that increases your heart rate and causes you to sweat) each week. In addition, most adults need muscle-strengthening exercises on 2 or more days a week.  Silver Sneakers may be a benefit available to you. To determine eligibility, you may visit the website: www.silversneakers.com or contact program at 684-439-4622 Mon-Fri between 8AM-8PM.   . Maintain a healthy weight. The body mass index (BMI) is a screening tool to identify possible weight problems. It provides an estimate of body fat based on height and weight. Your health care provider can find your BMI and can help you achieve or maintain a healthy weight.   For adults 20 years and older: ? A BMI below 18.5 is considered underweight. ? A BMI of 18.5 to 24.9 is normal. ? A BMI of 25 to 29.9 is considered overweight. ? A BMI of 30 and above is considered obese.   . Maintain normal blood lipids and cholesterol levels by exercising and minimizing your intake of saturated fat. Eat a balanced diet with plenty of fruit and vegetables. Blood tests for lipids and cholesterol should begin at age 33 and be repeated every 5 years. If your lipid or cholesterol levels are high, you are over 50, or you are at high risk for heart disease, you may  need your cholesterol levels checked more frequently. Ongoing high lipid and cholesterol levels should be treated with medicines if diet and exercise are not working.  . If you smoke, find out from your health care provider how to quit. If you do not use tobacco, please do not start.  . If you choose to drink alcohol, please do not consume more than 2 drinks per day. One drink is considered to be 12 ounces (355 mL) of beer, 5 ounces (148 mL) of wine, or 1.5 ounces (44 mL) of liquor.  . If you are 13-43 years old, ask your health care provider if  you should take aspirin to prevent strokes.  . Use sunscreen. Apply sunscreen liberally and repeatedly throughout the day. You should seek shade when your shadow is shorter than you. Protect yourself by wearing long sleeves, pants, a wide-brimmed hat, and sunglasses year round, whenever you are outdoors.  . Once a month, do a whole body skin exam, using a mirror to look at the skin on your back. Tell your health care provider of new moles, moles that have irregular borders, moles that are larger than a pencil eraser, or moles that have changed in shape or color.

## 2018-07-10 LAB — URINE CULTURE
MICRO NUMBER:: 90870335
SPECIMEN QUALITY:: ADEQUATE

## 2018-07-11 ENCOUNTER — Other Ambulatory Visit: Payer: Self-pay | Admitting: Family Medicine

## 2018-07-11 MED ORDER — CIPROFLOXACIN HCL 250 MG PO TABS: 250.0000 mg | ORAL_TABLET | Freq: Two times a day (BID) | ORAL | 0 refills | Status: DC

## 2018-07-11 NOTE — Progress Notes (Signed)
rx for cipro sent based on PCP note.  Call from [facility] with daughter reporting that patient can tolerate cipro.

## 2018-07-14 ENCOUNTER — Ambulatory Visit (INDEPENDENT_AMBULATORY_CARE_PROVIDER_SITE_OTHER): Payer: Medicare Other | Admitting: Family Medicine

## 2018-07-14 ENCOUNTER — Encounter: Payer: Self-pay | Admitting: Family Medicine

## 2018-07-14 VITALS — BP 122/64 | HR 79 | Temp 98.2°F | Ht 64.0 in | Wt 171.5 lb

## 2018-07-14 DIAGNOSIS — N390 Urinary tract infection, site not specified: Secondary | ICD-10-CM | POA: Diagnosis not present

## 2018-07-14 DIAGNOSIS — I2510 Atherosclerotic heart disease of native coronary artery without angina pectoris: Secondary | ICD-10-CM | POA: Diagnosis not present

## 2018-07-14 DIAGNOSIS — E7800 Pure hypercholesterolemia, unspecified: Secondary | ICD-10-CM

## 2018-07-14 DIAGNOSIS — M8589 Other specified disorders of bone density and structure, multiple sites: Secondary | ICD-10-CM | POA: Diagnosis not present

## 2018-07-14 DIAGNOSIS — K219 Gastro-esophageal reflux disease without esophagitis: Secondary | ICD-10-CM

## 2018-07-14 MED ORDER — CLOPIDOGREL BISULFATE 75 MG PO TABS: 75.0000 mg | ORAL_TABLET | Freq: Every day | ORAL | 11 refills | Status: DC

## 2018-07-14 MED ORDER — FLUTICASONE PROPIONATE 50 MCG/ACT NA SUSP: 2.0000 | Freq: Every day | NASAL | 11 refills | Status: DC

## 2018-07-14 MED ORDER — NEXIUM 40 MG PO CPDR: DELAYED_RELEASE_CAPSULE | ORAL | 11 refills | Status: DC

## 2018-07-14 NOTE — Progress Notes (Signed)
Subjective:    Patient ID: Ashlee Reyes, female    DOB: 04-26-1921, 84 y.o.   MRN: [id number]  HPI Here for annual f/u of chronic medical problems   Wt Readings from Last 3 Encounters:  07/14/18 171 lb 8 oz (77.8 kg)  07/08/18 171 lb 8 oz (77.8 kg)  02/28/18 166 lb 8 oz (75.5 kg)  not a lot of activity  Has been eating more 29.44 kg/m   Had amw on 7/23 Mini cog- 16/20- unable to recall 2 of 3 words Also unable to specify year Had a uti at the time - also some age related short term memory loss   H/o recurrent uti  (2 since episode of diarrhea) Recent uti - e coli - cipro  (resistant to some meds and into to others nec quinolone)  She takes trimethoprim for prophylaxis-it was resistant  She has to go back to urology  Still having frequency  A little burning  Overall some improved on abx - not all the way yet (just started it) Still having some confusion    Stays home a lot  Tries to do some housework    Breast cancer screening - declines at her age   PNA vaccine postponed due to itching from prevnar   dexa 10/11- declines another dexa Osteopenia No falls or fractures  She takes her vitamin D   Zoster status -has not been vaccinated  Declines  ? If ever had chicken pox    BP Readings from Last 3 Encounters:  07/14/18 122/64  07/08/18 140/76  06/10/18 130/80   Pulse Readings from Last 3 Encounters:  07/14/18 79  07/08/18 72  06/10/18 84    Hyperlipidemia Lab Results  Component Value Date   CHOL 240 (H) 07/08/2018   CHOL 256 (H) 03/07/2017   CHOL 245 (H) 02/06/2016   Lab Results  Component Value Date   HDL 54.30 07/08/2018   HDL 53.80 03/07/2017   HDL 43.40 02/06/2016   Lab Results  Component Value Date   LDLCALC 164 (H) 07/08/2018   LDLCALC 173 (H) 03/07/2017   LDLCALC 179 (H) 02/08/2015   Lab Results  Component Value Date   TRIG 110.0 07/08/2018   TRIG 145.0 03/07/2017   TRIG 221.0 (H) 02/06/2016   Lab Results  Component  Value Date   CHOLHDL 4 07/08/2018   CHOLHDL 5 03/07/2017   CHOLHDL 6 02/06/2016   Lab Results  Component Value Date   LDLDIRECT 161.0 02/06/2016   LDLDIRECT 150.5 01/06/2013   LDLDIRECT 173.9 12/05/2010   intolerant of statins She eats "too much"  Does not watch her diet very closely Some high cholesterol foods (usually eats fairly well)   Has a constant frog in her throat  Years  Zyrtec  flonase Zantac  Stays on all-helps a little   Other labs  Results for orders placed or performed in visit on 07/08/18  Urine Culture  Result Value Ref Range   MICRO NUMBER: [id number]    SPECIMEN QUALITY: ADEQUATE    Sample Source NOT GIVEN    STATUS: FINAL    ISOLATE 1: Escherichia coli (A)       Susceptibility   Escherichia coli - URINE CULTURE, REFLEX    AMOX/CLAVULANIC <=2 Sensitive     AMPICILLIN 4 Sensitive     AMPICILLIN/SULBACTAM <=2 Sensitive     CEFAZOLIN* <=4 Not Reportable      * For infections other than uncomplicated UTIcaused by E. coli, K. pneumoniae or  P. mirabilis:Cefazolin is resistant if MIC > or = 8 mcg/mL.(Distinguishing susceptible versus intermediatefor isolates with MIC < or = 4 mcg/mL requiresadditional testing.)For uncomplicated UTI caused by E. coli,K. pneumoniae or P. mirabilis: Cefazolin issusceptible if MIC <32 mcg/mL and predictssusceptible to the oral agents cefaclor, cefdinir,cefpodoxime, cefprozil, cefuroxime, cephalexinand loracarbef.    CEFEPIME <=1 Sensitive     CEFTRIAXONE <=1 Sensitive     CIPROFLOXACIN <=0.25 Sensitive     LEVOFLOXACIN <=0.12 Sensitive     ERTAPENEM <=0.5 Sensitive     GENTAMICIN <=1 Sensitive     IMIPENEM <=0.25 Sensitive     NITROFURANTOIN <=16 Sensitive     PIP/TAZO <=4 Sensitive     TOBRAMYCIN <=1 Sensitive     TRIMETH/SULFA* >=320 Resistant      * For infections other than uncomplicated UTIcaused by E. coli, K. pneumoniae or P. mirabilis:Cefazolin is resistant if MIC > or = 8 mcg/mL.(Distinguishing susceptible versus  intermediatefor isolates with MIC < or = 4 mcg/mL requiresadditional testing.)For uncomplicated UTI caused by E. coli,K. pneumoniae or P. mirabilis: Cefazolin issusceptible if MIC <32 mcg/mL and predictssusceptible to the oral agents cefaclor, cefdinir,cefpodoxime, cefprozil, cefuroxime, cephalexinand loracarbef.Legend:S = Susceptible  I = IntermediateR = Resistant  NS = Not susceptible* = Not tested  NR = Not reported**NN = See antimicrobic comments  TSH  Result Value Ref Range   TSH 2.25 0.35 - 4.50 uIU/mL  Lipid panel  Result Value Ref Range   Cholesterol 240 (H) 0 - 200 mg/dL   Triglycerides 110.0 0.0 - 149.0 mg/dL   HDL 54.30 >39.00 mg/dL   VLDL 22.0 0.0 - 40.0 mg/dL   LDL Cholesterol 164 (H) 0 - 99 mg/dL   Total CHOL/HDL Ratio 4    NonHDL 185.79   Comprehensive metabolic panel  Result Value Ref Range   Sodium 141 135 - 145 mEq/L   Potassium 4.2 3.5 - 5.1 mEq/L   Chloride 106 96 - 112 mEq/L   CO2 29 19 - 32 mEq/L   Glucose, Bld 91 70 - 99 mg/dL   BUN 14 6 - 23 mg/dL   Creatinine, Ser 0.93 0.40 - 1.20 mg/dL   Total Bilirubin 0.6 0.2 - 1.2 mg/dL   Alkaline Phosphatase 59 39 - 117 U/L   AST 13 0 - 37 U/L   ALT 13 0 - 35 U/L   Total Protein 6.7 6.0 - 8.3 g/dL   Albumin 3.7 3.5 - 5.2 g/dL   Calcium 9.0 8.4 - 10.5 mg/dL   GFR 59.55 (L) >60.00 mL/min  CBC with Differential/Platelet  Result Value Ref Range   WBC 6.4 4.0 - 10.5 K/uL   RBC 4.36 3.87 - 5.11 Mil/uL   Hemoglobin 13.0 12.0 - 15.0 g/dL   HCT 39.2 36.0 - 46.0 %   MCV 90.0 78.0 - 100.0 fl   MCHC 33.2 30.0 - 36.0 g/dL   RDW 14.6 11.5 - 15.5 %   Platelets 218.0 150.0 - 400.0 K/uL   Neutrophils Relative % 68.5 43.0 - 77.0 %   Lymphocytes Relative 22.7 12.0 - 46.0 %   Monocytes Relative 6.7 3.0 - 12.0 %   Eosinophils Relative 1.1 0.0 - 5.0 %   Basophils Relative 1.0 0.0 - 3.0 %   Neutro Abs 4.4 1.4 - 7.7 K/uL   Lymphs Abs 1.5 0.7 - 4.0 K/uL   Monocytes Absolute 0.4 0.1 - 1.0 K/uL   Eosinophils Absolute 0.1 0.0 - 0.7  K/uL   Basophils Absolute 0.1 0.0 - 0.1 K/uL  POCT  Urinalysis Dipstick (Automated)  Result Value Ref Range   Color, UA yellow    Clarity, UA clear    Glucose, UA Negative Negative   Bilirubin, UA negative    Ketones, UA negative    Spec Grav, UA 1.015 1.010 - 1.025   Blood, UA 1+    pH, UA 6.0 5.0 - 8.0   Protein, UA Negative Negative   Urobilinogen, UA 0.2 0.2 or 1.0 E.U./dL   Nitrite, UA positive    Leukocytes, UA Negative Negative     Patient Active Problem List   Diagnosis Date Noted  . Nausea & vomiting 02/28/2018  . History of endovascular stent graft for abdominal aortic aneurysm (AAA) 01/01/2018  . Unilateral primary osteoarthritis, left knee 07/11/2017  . Hyperlipidemia 03/12/2017  . Frequent UTI/interstitial cystitis 02/06/2016  . Hypotension 01/09/2016  . Slurred speech 06/01/2015  . Carotid stenosis 06/01/2015  . Local reaction to immunization 02/21/2015  . Encounter for Medicare annual wellness exam 02/15/2015  . Estrogen deficiency 02/15/2015  . Sinusitis, chronic 01/12/2015  . IBS (irritable bowel syndrome) 01/12/2015  . Dizziness   . Pulsatile tinnitus of left ear 07/08/2014  . Palpitations 05/13/2014  . Diarrhea 12/23/2013  . Gastritis 10/28/2013  . Second degree heart block 10/18/2013  . Syncope 10/17/2013  . Aneurysm of abdominal vessel ([place]) 06/25/2013  . Osteopenia 01/13/2013  . CAD (coronary artery disease)   . Chronic depression 06/03/2007  . GERD 06/02/2007  . Asthma   . Chronic interstitial cystitis   . Osteoarthritis   . Spinal stenosis of lumbar region   . History of urinary tract infection    Past Medical History:  Diagnosis Date  . AAA (abdominal aortic aneurysm) ([place])    followed by Dr Oneida Alar. Last u/s07/10 stable AAA with largest measurement 3.97 x 4.02cm.  . Allergic rhinitis, seasonal   . Asthma   . Atrial fibrillation ([place])   . CAD (coronary artery disease) 07/2009   mod by cath 08/10  . Complication of anesthesia   .  Depression   . Disc disease, degenerative, lumbar or lumbosacral    SPINAL STENOSIS  . Diverticulosis   . GERD (gastroesophageal reflux disease)   . Hemorrhoids   . History of recurrent UTIs   . Hyperlipidemia   . IBS (irritable bowel syndrome)   . IC (irritable colon)    DMSO in past  . Interstitial cystitis   . Osteoarthritis    spine/spinal stenosis  . Osteopenia 2011  . PONV (postoperative nausea and vomiting)   . Skin cancer    basal cell cancer - face.   Past Surgical History:  Procedure Laterality Date  . ABDOMINAL AORTIC ENDOVASCULAR STENT GRAFT N/A 07/01/2013   Procedure: ABDOMINAL AORTIC ENDOVASCULAR STENT GRAFT- GORE;  Surgeon: Elam Dutch, MD;  Location: [facility];  Service: Vascular;  Laterality: N/A;  Ultrasound guided  . ABDOMINAL HYSTERECTOMY     partial not cancer  . BREAST SURGERY     lumpectomy  . CYSTOCELE REPAIR    . ESOPHAGOGASTRODUODENOSCOPY  09/1998   erosive esphagitis ? stricture treated 12/2002  . HERNIA REPAIR  68/1275   umbilical hernia  . RECTOCELE REPAIR    . REFRACTIVE SURGERY     for film after cataract surgery.  Marland Kitchen RIGHT COLECTOMY  1997   benign  . SKIN LESION EXCISION  05/2011   basal cell skin lesion removed.   Social History   Tobacco Use  . Smoking status: Never Smoker  . Smokeless tobacco: Never  Used  Substance Use Topics  . Alcohol use: No    Alcohol/week: 0.0 oz  . Drug use: No   Family History  Problem Relation Age of Onset  . Heart disease Mother        deceased age 75 heart attack.  Marland Kitchen Heart attack Mother   . Other Mother        varicose veins  . Varicose Veins Mother   . Heart disease Father        age 6 heart attack  . Cancer Father   . Stroke Brother   . Cancer Brother   . Heart disease Brother   . Heart attack Brother   . Cancer Daughter   . Diabetes Son    Allergies  Allergen Reactions  . Amoxicillin-Pot Clavulanate     REACTION: sick  . Avelox [Moxifloxacin Hcl In Nacl] Nausea Only  . Ibuprofen       REACTION: GI  . Keflex [Cephalexin]     abd pain / GI upset  . Nitrofurantoin Other (See Comments)    Chest pain or indigestion with nausea  . Omeprazole Nausea Only  . Prevnar [Pneumococcal 13-Val Conj Vacc] Itching    Severe local reaction with redness and itching   . Rosuvastatin     REACTION: foot pain  . Sertraline Hcl     REACTION: more depressed  . Tetanus Toxoids     Local redness and swelling    Current Outpatient Medications on File Prior to Visit  Medication Sig Dispense Refill  . acetaminophen (TYLENOL) 500 MG tablet Take 500 mg by mouth every 4 (four) hours as needed for mild pain or moderate pain. For pain    . acidophilus (RISAQUAD) CAPS capsule Take 1 capsule by mouth daily.    Marland Kitchen albuterol (PROAIR HFA) 108 (90 Base) MCG/ACT inhaler Inhale 2 puffs into the lungs every 4 (four) hours as needed for wheezing. 1 Inhaler 2  . beclomethasone (QVAR REDIHALER) 80 MCG/ACT inhaler Inhale 2 puffs into the lungs 2 (two) times daily. 1 Inhaler 5  . cetirizine (ZYRTEC) 10 MG tablet TAKE 1 TABLET BY MOUTH DAILY AS NEEDED FOR ALLERGIES 30 tablet 5  . Cholecalciferol (VITAMIN D PO) Take 1 capsule by mouth at bedtime.    . ciprofloxacin (CIPRO) 250 MG tablet Take 1 tablet (250 mg total) by mouth 2 (two) times daily. 10 tablet 0  . LUMIGAN 0.01 % SOLN Place 1 drop into both eyes at bedtime.    . methocarbamol (ROBAXIN) 500 MG tablet Take 1 tablet (500 mg total) by mouth 2 (two) times daily. For back Spasm (Patient taking differently: Take 500 mg by mouth as needed. For back Spasm) 60 tablet 0  . ondansetron (ZOFRAN ODT) 4 MG disintegrating tablet Take 1 tablet (4 mg total) by mouth every 8 (eight) hours as needed for nausea or vomiting. 20 tablet 0  . Polyethyl Glycol-Propyl Glycol (SYSTANE OP) Place 1 drop into both eyes as needed (dry eyes).     . polyethylene glycol (MIRALAX / GLYCOLAX) packet Take 17 g by mouth daily as needed. For constipation    . ranitidine (ZANTAC) 300 MG tablet  Take 1 tablet (300 mg total) by mouth at bedtime. 30 tablet 5  . traMADol (ULTRAM) 50 MG tablet TAKE 1 TABLET(50 MG) BY MOUTH EVERY 8 HOURS AS NEEDED. MAY TAKE WITH TYLENOL 500 MG EACH TIME 30 tablet 0  . trimethoprim (TRIMPEX) 100 MG tablet Take 1 tablet (100 mg total) by mouth 2 (  two) times daily. 60 tablet 5   No current facility-administered medications on file prior to visit.     Review of Systems  Constitutional: Positive for fatigue. Negative for activity change, appetite change, fever and unexpected weight change.  HENT: Negative for congestion, ear pain, rhinorrhea, sinus pressure and sore throat.   Eyes: Negative for pain, redness and visual disturbance.  Respiratory: Negative for cough, shortness of breath and wheezing.   Cardiovascular: Negative for chest pain and palpitations.  Gastrointestinal: Negative for abdominal pain, blood in stool, constipation and diarrhea.  Endocrine: Negative for polydipsia and polyuria.  Genitourinary: Positive for frequency and urgency. Negative for difficulty urinating, dysuria and hematuria.       Dysuria is improved   Musculoskeletal: Negative for arthralgias, back pain and myalgias.  Skin: Negative for pallor and rash.  Allergic/Immunologic: Negative for environmental allergies.  Neurological: Negative for dizziness, syncope and headaches.  Hematological: Negative for adenopathy. Does not bruise/bleed easily.  Psychiatric/Behavioral: Negative for decreased concentration and dysphoric mood. The patient is not nervous/anxious.        Less short term memory as she ages       Objective:   Physical Exam  Constitutional: She appears well-developed and well-nourished. No distress.  Well appearing overwt elderly female   HENT:  Head: Normocephalic and atraumatic.  Right Ear: External ear normal.  Left Ear: External ear normal.  Nose: Nose normal.  Mouth/Throat: Oropharynx is clear and moist.  Eyes: Pupils are equal, round, and reactive to  light. Conjunctivae and EOM are normal. Right eye exhibits no discharge. Left eye exhibits no discharge. No scleral icterus.  Neck: Normal range of motion. Neck supple. No JVD present. Carotid bruit is present. No thyromegaly present.  Cardiovascular: Normal rate, regular rhythm, normal heart sounds and intact distal pulses. Exam reveals no gallop.  Pulmonary/Chest: Effort normal and breath sounds normal. No stridor. No respiratory distress. She has no wheezes. She has no rales.  Abdominal: Soft. Bowel sounds are normal. She exhibits no distension and no mass. There is no tenderness. There is no rebound and no guarding. No hernia.  No suprapubic tenderness or fullness    Musculoskeletal: She exhibits no edema, tenderness or deformity.  Mild kyphosis   Lymphadenopathy:    She has no cervical adenopathy.  Neurological: She is alert. She has normal reflexes. She displays normal reflexes. No cranial nerve deficit. She exhibits normal muscle tone. Coordination normal.  Skin: Skin is warm and dry. Capillary refill takes less than 2 seconds. No rash noted. No erythema. No pallor.  Solar lentigines diffusely Also SKs diffusely  Psychiatric: She has a normal mood and affect.  Pt states short term memory is not as good as it used to be Seems mentally sharp today          Assessment & Plan:   Problem List Items Addressed This Visit      Digestive   GERD (Chronic)    Continues ppi and H2 blocker Still has chronic throat clearing       Relevant Medications   NEXIUM 40 MG capsule     Musculoskeletal and Integument   Osteopenia    Declines further eval with dexa  No falls or fx  Disc need for calcium/ vitamin D/ wt bearing exercise and bone density test every 2 y to monitor Disc safety/ fracture risk in detail          Genitourinary   Frequent UTI/interstitial cystitis - Primary    Rev e coli  urine cx Improving with cipro  Enc fluids Continue f/u with urology  Continues  prophylactic trimethoprim        Other   Hyperlipidemia    Disc goals for lipids and reasons to control them Rev last labs with pt Rev low sat fat diet in detail With CAD and carotid stenosis  Intol to statins  Does not watch diet closely

## 2018-07-14 NOTE — Patient Instructions (Addendum)
The most important thing you can do for memory is to socialize  If you can get out more or get together with people it would help  Also reading / doing math or puzzles   For cholesterol Avoid red meat/ fried foods/ egg yolks/ fatty breakfast meats/ butter, cheese and high fat dairy/ and shellfish    Please let me know if urinary symptoms worsen   Elevate feet when you sit if you can

## 2018-07-14 NOTE — Assessment & Plan Note (Signed)
Declines further eval with dexa  No falls or fx  Disc need for calcium/ vitamin D/ wt bearing exercise and bone density test every 2 y to monitor Disc safety/ fracture risk in detail

## 2018-07-14 NOTE — Assessment & Plan Note (Signed)
Rev e coli urine cx Improving with cipro  Enc fluids Continue f/u with urology  Continues prophylactic trimethoprim

## 2018-07-14 NOTE — Assessment & Plan Note (Signed)
Continues ppi and H2 blocker Still has chronic throat clearing

## 2018-07-14 NOTE — Assessment & Plan Note (Signed)
Disc goals for lipids and reasons to control them Rev last labs with pt Rev low sat fat diet in detail With CAD and carotid stenosis  Intol to statins  Does not watch diet closely

## 2018-07-15 ENCOUNTER — Telehealth: Payer: Self-pay

## 2018-07-15 NOTE — Telephone Encounter (Signed)
I did not either Perhaps it was an automated call?

## 2018-07-15 NOTE — Telephone Encounter (Signed)
I haven't called pt, will route to Dr. Theodore Demark she has contacted pt/family

## 2018-07-15 NOTE — Telephone Encounter (Signed)
Copied from [place] [id number]. Topic: Inquiry >> Jul 15, 2018 12:57 PM Oliver Pila B wrote: Reason for CRM: pt's daughter called b/c her mother answered the phone and she is not suppose to b/c of her memory; daughter called to see if there is any information she needs to know

## 2018-07-17 ENCOUNTER — Other Ambulatory Visit: Payer: Self-pay

## 2018-08-20 ENCOUNTER — Other Ambulatory Visit (INDEPENDENT_AMBULATORY_CARE_PROVIDER_SITE_OTHER): Payer: Medicare Other

## 2018-08-20 ENCOUNTER — Ambulatory Visit: Payer: Self-pay | Admitting: *Deleted

## 2018-08-20 ENCOUNTER — Other Ambulatory Visit: Payer: Medicare Other

## 2018-08-20 DIAGNOSIS — R350 Frequency of micturition: Secondary | ICD-10-CM

## 2018-08-20 LAB — POCT URINALYSIS DIPSTICK
BILIRUBIN UA: NEGATIVE
Blood, UA: 10
Glucose, UA: NEGATIVE
Ketones, UA: NEGATIVE
Leukocytes, UA: NEGATIVE
NITRITE UA: NEGATIVE
PROTEIN UA: NEGATIVE
UROBILINOGEN UA: 0.2 U/dL
pH, UA: 6 (ref 5.0–8.0)

## 2018-08-20 NOTE — Telephone Encounter (Signed)
Pt 's daughter called with her mom possibly having a UTI. She states she is confused and feels like it is getting worse. She is not feverish. No c/o pain. But she is having an increased in urination, up several times during the night. She wants to know if she can give a urine to be checked today so she can get started with treatment by Friday. She is requesting a call back this morning if possible.  Advised to call back for any worsening symptoms.  Will notify flow at [facility] at [facility].  Reason for Disposition . Urinating more frequently than usual (i.e., frequency)  Answer Assessment - Initial Assessment Questions 1. SYMPTOM: "What's the main symptom you're concerned about?" (e.g., frequency, incontinence)     confusion 2. ONSET: "When did the  confusion  start?"     Gotten worse 3. PAIN: "Is there any pain?" If so, ask: "How bad is it?" (Scale: 1-10; mild, moderate, severe)     no 4. CAUSE: "What do you think is causing the symptoms?"     UTI 5. OTHER SYMPTOMS: "Do you have any other symptoms?" (e.g., fever, flank pain, blood in urine, pain with urination)     Increase in urination  Protocols used: URINARY [facility]

## 2018-08-20 NOTE — Addendum Note (Signed)
Addended by: Tammi Sou on: 08/20/2018 09:49 AM   Modules accepted: Miquel Dunn

## 2018-08-20 NOTE — Telephone Encounter (Signed)
Daughter notified to bring urine sample to the office for UA and Cx

## 2018-08-20 NOTE — Telephone Encounter (Signed)
I think they have difficulty getting her in  Please bring in urine sample for ua and cx

## 2018-08-21 ENCOUNTER — Other Ambulatory Visit: Payer: Self-pay | Admitting: Allergy and Immunology

## 2018-08-21 LAB — URINE CULTURE
MICRO NUMBER: 91056221
Result:: NO GROWTH
SPECIMEN QUALITY:: ADEQUATE

## 2018-09-05 ENCOUNTER — Telehealth: Payer: Self-pay | Admitting: Allergy and Immunology

## 2018-09-05 NOTE — Telephone Encounter (Signed)
Pt daughter called and said that she heard on tv that they were going to recall Ranitidine because it is causing health problems. [phone]

## 2018-09-05 NOTE — Telephone Encounter (Signed)
Please advise 

## 2018-09-08 NOTE — Telephone Encounter (Signed)
The FDA does NOT recommend stopping ranitidine at this point.

## 2018-09-08 NOTE — Telephone Encounter (Signed)
Patients daughter has been advised.  

## 2018-09-08 NOTE — Telephone Encounter (Signed)
Left message on answering machine to return call. We need to advise as written per Dr Neldon Mc

## 2018-09-09 DIAGNOSIS — N3000 Acute cystitis without hematuria: Secondary | ICD-10-CM | POA: Diagnosis not present

## 2018-09-09 DIAGNOSIS — R350 Frequency of micturition: Secondary | ICD-10-CM | POA: Diagnosis not present

## 2018-09-11 ENCOUNTER — Other Ambulatory Visit (HOSPITAL_COMMUNITY): Payer: Medicare Other

## 2018-09-11 ENCOUNTER — Ambulatory Visit: Payer: Medicare Other | Admitting: Vascular Surgery

## 2018-09-12 ENCOUNTER — Other Ambulatory Visit: Payer: Self-pay | Admitting: Allergy and Immunology

## 2018-09-15 ENCOUNTER — Other Ambulatory Visit: Payer: Self-pay | Admitting: Family Medicine

## 2018-09-15 MED ORDER — ONDANSETRON 4 MG PO TBDP: 4.0000 mg | ORAL_TABLET | Freq: Three times a day (TID) | ORAL | 1 refills | Status: DC | PRN

## 2018-09-15 NOTE — Telephone Encounter (Signed)
I refilled it  If nausea worsens or becomes constant please f/u for eval

## 2018-09-15 NOTE — Telephone Encounter (Signed)
Annual exam on 07/14/18.Please advise.

## 2018-09-15 NOTE — Telephone Encounter (Signed)
Copied from [place] [id number]. Topic: Quick Communication - See Telephone Encounter >> Sep 15, 2018  8:28 AM Conception Chancy, NT wrote: CRM for notification. See Telephone encounter for: 09/15/18.  Patient daughter is calling and requesting ondansetron (ZOFRAN ODT) 4 MG disintegrating tablet  to be refilled. She states it was prescribed in the hospital back in March for 20 tablets but sometimes she gets nausea without fever. She took the last one this weekend. Please advise.  [id number] - [place], [place] - [place] AT [place] [address] [place] [address] Phone: [phone] Fax: [phone]

## 2018-09-15 NOTE — Telephone Encounter (Signed)
Daughter [name] notified Rx sent and advise her of Dr. Marliss Coots comments/ recommendations

## 2018-09-16 ENCOUNTER — Telehealth: Payer: Self-pay | Admitting: *Deleted

## 2018-09-16 MED ORDER — FLUTICASONE PROPIONATE HFA 44 MCG/ACT IN AERO: 2.0000 | INHALATION_SPRAY | Freq: Every day | RESPIRATORY_TRACT | 5 refills | Status: DC

## 2018-09-16 NOTE — Telephone Encounter (Signed)
PA received for Qvar [employer] prefers Arnuity Ellipta, Flovent Diskus, and Flovent HFA please advise

## 2018-09-16 NOTE — Telephone Encounter (Signed)
Called and informed Patient's daughter that insurance will not cover Qvar Redihaler and we have sent in Flovent 44 2 inhalations once a day.Daughter verbalized understanding and was thankful for letting them know.

## 2018-09-16 NOTE — Telephone Encounter (Signed)
flovent 44 - 2 inhalations one time per day

## 2018-09-18 ENCOUNTER — Emergency Department (HOSPITAL_COMMUNITY)
Admission: EM | Admit: 2018-09-18 | Discharge: 2018-09-18 | Disposition: A | Discharge status: Home / Self Care | Payer: Medicare Other | Attending: Emergency Medicine | Admitting: Emergency Medicine

## 2018-09-18 ENCOUNTER — Emergency Department (HOSPITAL_COMMUNITY): Payer: Medicare Other

## 2018-09-18 DIAGNOSIS — I10 Essential (primary) hypertension: Secondary | ICD-10-CM | POA: Diagnosis not present

## 2018-09-18 DIAGNOSIS — Y929 Unspecified place or not applicable: Secondary | ICD-10-CM | POA: Diagnosis not present

## 2018-09-18 DIAGNOSIS — J45909 Unspecified asthma, uncomplicated: Secondary | ICD-10-CM | POA: Diagnosis not present

## 2018-09-18 DIAGNOSIS — I2510 Atherosclerotic heart disease of native coronary artery without angina pectoris: Secondary | ICD-10-CM | POA: Insufficient documentation

## 2018-09-18 DIAGNOSIS — Y939 Activity, unspecified: Secondary | ICD-10-CM | POA: Diagnosis not present

## 2018-09-18 DIAGNOSIS — R109 Unspecified abdominal pain: Secondary | ICD-10-CM

## 2018-09-18 DIAGNOSIS — X58XXXA Exposure to other specified factors, initial encounter: Secondary | ICD-10-CM | POA: Diagnosis not present

## 2018-09-18 DIAGNOSIS — K5730 Diverticulosis of large intestine without perforation or abscess without bleeding: Secondary | ICD-10-CM | POA: Diagnosis not present

## 2018-09-18 DIAGNOSIS — S3992XA Unspecified injury of lower back, initial encounter: Secondary | ICD-10-CM | POA: Diagnosis present

## 2018-09-18 DIAGNOSIS — Y999 Unspecified external cause status: Secondary | ICD-10-CM | POA: Diagnosis not present

## 2018-09-18 DIAGNOSIS — R112 Nausea with vomiting, unspecified: Secondary | ICD-10-CM | POA: Diagnosis not present

## 2018-09-18 DIAGNOSIS — S32010A Wedge compression fracture of first lumbar vertebra, initial encounter for closed fracture: Secondary | ICD-10-CM | POA: Insufficient documentation

## 2018-09-18 DIAGNOSIS — R0902 Hypoxemia: Secondary | ICD-10-CM | POA: Diagnosis not present

## 2018-09-18 DIAGNOSIS — R1111 Vomiting without nausea: Secondary | ICD-10-CM | POA: Diagnosis not present

## 2018-09-18 DIAGNOSIS — R404 Transient alteration of awareness: Secondary | ICD-10-CM | POA: Diagnosis not present

## 2018-09-18 LAB — URINALYSIS, ROUTINE W REFLEX MICROSCOPIC
Bilirubin Urine: NEGATIVE
GLUCOSE, UA: NEGATIVE mg/dL
Ketones, ur: NEGATIVE mg/dL
Leukocytes, UA: NEGATIVE
Nitrite: NEGATIVE
PH: 8 (ref 5.0–8.0)
Protein, ur: NEGATIVE mg/dL
Specific Gravity, Urine: 1.014 (ref 1.005–1.030)

## 2018-09-18 LAB — COMPREHENSIVE METABOLIC PANEL
ALK PHOS: 57 U/L (ref 38–126)
ALT: 12 U/L (ref 0–44)
AST: 17 U/L (ref 15–41)
Albumin: 3.6 g/dL (ref 3.5–5.0)
Anion gap: 8 (ref 5–15)
BUN: 16 mg/dL (ref 8–23)
CALCIUM: 9.1 mg/dL (ref 8.9–10.3)
CHLORIDE: 101 mmol/L (ref 98–111)
CO2: 31 mmol/L (ref 22–32)
Creatinine, Ser: 0.96 mg/dL (ref 0.44–1.00)
GFR calc Af Amer: 56 mL/min — ABNORMAL LOW (ref >60)
GFR, EST NON AFRICAN AMERICAN: 49 mL/min — AB (ref >60)
Glucose, Bld: 126 mg/dL — ABNORMAL HIGH (ref 70–99)
Potassium: 4.1 mmol/L (ref 3.5–5.1)
Sodium: 140 mmol/L (ref 135–145)
Total Bilirubin: 1 mg/dL (ref 0.3–1.2)
Total Protein: 6.5 g/dL (ref 6.5–8.1)

## 2018-09-18 LAB — CBC WITH DIFFERENTIAL/PLATELET
BASOS ABS: 0 10*3/uL (ref 0.0–0.1)
Basophils Relative: 0 %
EOS ABS: 0.1 10*3/uL (ref 0.0–0.7)
Eosinophils Relative: 1 %
HCT: 41.2 % (ref 36.0–46.0)
Hemoglobin: 13.4 g/dL (ref 12.0–15.0)
LYMPHS ABS: 1.2 10*3/uL (ref 0.7–4.0)
LYMPHS PCT: 13 %
MCH: 30.4 pg (ref 26.0–34.0)
MCHC: 32.5 g/dL (ref 30.0–36.0)
MCV: 93.4 fL (ref 78.0–100.0)
Monocytes Absolute: 0.6 10*3/uL (ref 0.1–1.0)
Monocytes Relative: 7 %
Neutro Abs: 7.5 10*3/uL (ref 1.7–7.7)
Neutrophils Relative %: 79 %
PLATELETS: 225 10*3/uL (ref 150–400)
RBC: 4.41 MIL/uL (ref 3.87–5.11)
RDW: 14 % (ref 11.5–15.5)
WBC: 9.4 10*3/uL (ref 4.0–10.5)

## 2018-09-18 LAB — LIPASE, BLOOD: LIPASE: 29 U/L (ref 11–51)

## 2018-09-18 MED ORDER — METHOCARBAMOL 500 MG PO TABS
500.0000 mg | ORAL_TABLET | Freq: Once | ORAL | Status: AC
  Administered 2018-09-18: 500 mg via ORAL
  Filled 2018-09-18: qty 1

## 2018-09-18 MED ORDER — FENTANYL CITRATE (PF) 100 MCG/2ML IJ SOLN: 50.0000 ug | Freq: Once | INTRAMUSCULAR | Status: DC

## 2018-09-18 MED ORDER — OXYCODONE-ACETAMINOPHEN 5-325 MG PO TABS: 0.5000 | ORAL_TABLET | ORAL | 0 refills | Status: DC | PRN

## 2018-09-18 MED ORDER — IOPAMIDOL (ISOVUE-300) INJECTION 61%
100.0000 mL | Freq: Once | INTRAVENOUS | Status: AC | PRN
  Administered 2018-09-18: 100 mL via INTRAVENOUS

## 2018-09-18 MED ORDER — HYDROCODONE-ACETAMINOPHEN 5-325 MG PO TABS
1.0000 | ORAL_TABLET | Freq: Once | ORAL | Status: AC
  Administered 2018-09-18: 1 via ORAL
  Filled 2018-09-18: qty 1

## 2018-09-18 MED ORDER — IOPAMIDOL (ISOVUE-300) INJECTION 61%
INTRAVENOUS | Status: AC
  Filled 2018-09-18: qty 100

## 2018-09-18 MED ORDER — OXYCODONE-ACETAMINOPHEN 5-325 MG PO TABS
1.0000 | ORAL_TABLET | Freq: Once | ORAL | Status: AC
  Administered 2018-09-18: 1 via ORAL
  Filled 2018-09-18: qty 1

## 2018-09-18 NOTE — ED Notes (Signed)
Patient able to ambulate to restroom with walker and with the assistance of one staff member.

## 2018-09-18 NOTE — ED Provider Notes (Signed)
Emergency Department Provider Note   I have reviewed the triage vital signs and the nursing notes.   HISTORY  Chief Complaint Abdominal Pain   HPI Ashlee Reyes is a 84 y.o. female with multiple past medical problems as documented below the presents to the emergency department with her daughter who provides most the history.  So that the patient has had a history of pre-significant diverticulitis he also has issues with constipation he had baseline.  She states that her mother had constipation for couple days and yesterday she gave her milk of magnesia early in the morning and then again in the early afternoon and the patient had dark bowel movement with diarrhea last night which continued so she gave her some type of diarrhea medicine.  The seems to improve the diarrhea.  But now the patient is having left-sided abdominal pain and had a episode of dry heaving this morning.  With persistent dark stools.  Is on Plavix.  No injuries.  No fevers.  No bright red blood. No other associated or modifying symptoms.    Past Medical History:  Diagnosis Date  . AAA (abdominal aortic aneurysm) ([place])    followed by Dr Oneida Alar. Last u/s07/10 stable AAA with largest measurement 3.97 x 4.02cm.  . Allergic rhinitis, seasonal   . Asthma   . Atrial fibrillation ([place])   . CAD (coronary artery disease) 07/2009   mod by cath 08/10  . Complication of anesthesia   . Depression   . Disc disease, degenerative, lumbar or lumbosacral    SPINAL STENOSIS  . Diverticulosis   . GERD (gastroesophageal reflux disease)   . Hemorrhoids   . History of recurrent UTIs   . Hyperlipidemia   . IBS (irritable bowel syndrome)   . IC (irritable colon)    DMSO in past  . Interstitial cystitis   . Osteoarthritis    spine/spinal stenosis  . Osteopenia 2011  . PONV (postoperative nausea and vomiting)   . Skin cancer    basal cell cancer - face.    Patient Active Problem List   Diagnosis Date Noted  .  History of endovascular stent graft for abdominal aortic aneurysm (AAA) 01/01/2018  . Unilateral primary osteoarthritis, left knee 07/11/2017  . Hyperlipidemia 03/12/2017  . Frequent UTI/interstitial cystitis 02/06/2016  . Hypotension 01/09/2016  . Slurred speech 06/01/2015  . Carotid stenosis 06/01/2015  . Local reaction to immunization 02/21/2015  . Encounter for Medicare annual wellness exam 02/15/2015  . Estrogen deficiency 02/15/2015  . Sinusitis, chronic 01/12/2015  . IBS (irritable bowel syndrome) 01/12/2015  . Dizziness   . Palpitations 05/13/2014  . Diarrhea 12/23/2013  . Second degree heart block 10/18/2013  . Syncope 10/17/2013  . Aneurysm of abdominal vessel ([place]) 06/25/2013  . Osteopenia 01/13/2013  . CAD (coronary artery disease)   . Chronic depression 06/03/2007  . GERD 06/02/2007  . Asthma   . Chronic interstitial cystitis   . Osteoarthritis   . Spinal stenosis of lumbar region   . History of urinary tract infection     Past Surgical History:  Procedure Laterality Date  . ABDOMINAL AORTIC ENDOVASCULAR STENT GRAFT N/A 07/01/2013   Procedure: ABDOMINAL AORTIC ENDOVASCULAR STENT GRAFT- GORE;  Surgeon: Elam Dutch, MD;  Location: [facility];  Service: Vascular;  Laterality: N/A;  Ultrasound guided  . ABDOMINAL HYSTERECTOMY     partial not cancer  . BREAST SURGERY     lumpectomy  . CYSTOCELE REPAIR    . ESOPHAGOGASTRODUODENOSCOPY  09/1998   erosive esphagitis ? stricture treated 12/2002  . HERNIA REPAIR  60/6301   umbilical hernia  . RECTOCELE REPAIR    . REFRACTIVE SURGERY     for film after cataract surgery.  Marland Kitchen RIGHT COLECTOMY  1997   benign  . SKIN LESION EXCISION  05/2011   basal cell skin lesion removed.    Current Outpatient Rx  . Order #: [id number] Class: Historical Med  . Order #: [id number] Class: Normal  . Order #: [id number] Class: Normal  . Order #: [id number] Class: Historical Med  . Order #: [id number] Class: Normal  . Order #: [id number] Class:  Normal  . Order #: [id number] Class: Historical Med  . Order #: [id number] Class: Historical Med  . Order #: [id number] Class: Print  . Order #: [id number] Class: Normal  . Order #: [id number] Class: Normal  . Order #: [id number] Class: Historical Med  . Order #: [id number] Class: Historical Med  . Order #: [id number] Class: Historical Med  . Order #: [id number] Class: Normal  . Order #: [id number] Class: Normal  . Order #: [id number] Class: Normal  . Order #: [id number] Class: Normal  . Order #: [id number] Class: Normal    Allergies Amoxicillin-pot clavulanate; Avelox [moxifloxacin]; Ibuprofen; Keflex [cephalexin]; Nitrofurantoin; Omeprazole; Prevnar [pneumococcal 13-val conj vacc]; Rosuvastatin; Sertraline hcl; and Tetanus toxoids  Family History  Problem Relation Age of Onset  . Heart disease Mother        deceased age 11 heart attack.  Marland Kitchen Heart attack Mother   . Other Mother        varicose veins  . Varicose Veins Mother   . Heart disease Father        age 92 heart attack  . Cancer Father   . Stroke Brother   . Cancer Brother   . Heart disease Brother   . Heart attack Brother   . Cancer Daughter   . Diabetes Son     Social History Social History   Tobacco Use  . Smoking status: Never Smoker  . Smokeless tobacco: Never Used  Substance Use Topics  . Alcohol use: No    Alcohol/week: 0.0 standard drinks  . Drug use: No    Review of Systems  All other systems negative except as documented in the HPI. All pertinent positives and negatives as reviewed in the HPI. ____________________________________________   PHYSICAL EXAM:  VITAL SIGNS: Vitals:   09/18/18 1400 09/18/18 1527  BP: 117/68 (!) 112/51  Pulse: 80 86  Resp: 16 20  Temp:    SpO2: 93% 92%    Constitutional: Alert and oriented. Well appearing and in no acute distress. Clutching left mid abdomen but not grimacing. Eyes: Conjunctivae are normal. PERRL. EOMI. Head: Atraumatic. Nose: No congestion/rhinnorhea. Mouth/Throat:  Mucous membranes are moist.  Oropharynx non-erythematous. Neck: No stridor.  No meningeal signs.   Cardiovascular: Normal rate, regular rhythm. Good peripheral circulation. Grossly normal heart sounds.   Respiratory: Normal respiratory effort.  No retractions. Lungs CTAB. Gastrointestinal: Soft and nontender. No distention.  Musculoskeletal: No lower extremity tenderness nor edema. No gross deformities of extremities. Neurologic:  Normal speech and language. No gross focal neurologic deficits are appreciated.  Skin:  Skin is warm, dry and intact. No rash noted.  ____________________________________________   LABS (all labs ordered are listed, but only abnormal results are displayed)  Labs Reviewed  COMPREHENSIVE METABOLIC PANEL - Abnormal; Notable for the following components:      Result Value   Glucose, Bld 126 (*)    GFR calc non Af Amer 49 (*)    GFR calc Af  Amer 56 (*)    All other components within normal limits  URINALYSIS, ROUTINE W REFLEX MICROSCOPIC - Abnormal; Notable for the following components:   APPearance HAZY (*)    Hgb urine dipstick SMALL (*)    Bacteria, UA RARE (*)    All other components within normal limits  LIPASE, BLOOD  CBC WITH DIFFERENTIAL/PLATELET  OCCULT BLOOD X 1 CARD TO LAB, STOOL  TYPE AND SCREEN   ____________________________________________  RADIOLOGY  Ct Abdomen Pelvis W Contrast  Result Date: 09/18/2018 CLINICAL DATA:  NAUSEA, VOMITING, TARSI STOOLS, LEFT-SIDED FLANK PAIN EXAM: CT ABDOMEN AND PELVIS WITH CONTRAST TECHNIQUE: Multidetector CT imaging of the abdomen and pelvis was performed using the standard protocol following bolus administration of intravenous contrast. CONTRAST:  155mL ISOVUE-300 IOPAMIDOL (ISOVUE-300) INJECTION 61% COMPARISON:  None. FINDINGS: Lower chest: No acute abnormality.  Stable cardiomegaly. Hepatobiliary: Diffuse low attenuation of the liver as can be seen with hepatic steatosis. No focal hepatic mass. No  intrahepatic or extrahepatic biliary ductal dilatation. Normal gallbladder. Pancreas: Unremarkable. No pancreatic ductal dilatation or surrounding inflammatory changes. Spleen: Normal in size without focal abnormality. Adrenals/Urinary Tract: Normal adrenal glands. No urolithiasis or obstructive uropathy. Bilateral parapelvic cysts. 2.4 cm hypodense, fluid attenuating right renal mass most consistent with a cyst. Normal bladder. Stomach/Bowel: Stomach is within normal limits. Appendix appears normal. No evidence of bowel wall thickening, distention, or inflammatory changes. Sigmoid diverticulosis without evidence of diverticulitis. Vascular/Lymphatic: Aorto bi-iliac stent graft is noted in satisfactory position without change compared with 01/01/2018 no lymphadenopathy. Reproductive: Status post hysterectomy. No adnexal masses. Other: No abdominal wall hernia or abnormality. No abdominopelvic ascites. Musculoskeletal: No aggressive osseous lesion. Degenerative disc disease with disc height loss throughout the lumbar spine with bilateral facet arthropathy and foraminal narrowing. Grade 1 anterolisthesis of L3 on L4 and L4 on L5 secondary to facet disease. L1 vertebral body compression fracture with approximately 50% height loss which has progressed compared with 01/01/2018. IMPRESSION: 1. No acute abdominal or pelvic pathology. 2. Sigmoid diverticulosis without evidence of diverticulitis. 3. L1 vertebral body compression fracture with approximately 50% height loss which has progressed compared with 01/01/2018. 4. Hepatic steatosis. 5.  Aortic Atherosclerosis (ICD10-I70.0). Electronically Signed   By: Kathreen Devoid   On: 09/18/2018 11:24    ____________________________________________   PROCEDURES  Procedure(s) performed:   Procedures   ____________________________________________   INITIAL IMPRESSION / ASSESSMENT AND PLAN / ED COURSE  Patient is 84 years old with a history of intra-abdominal  pathology making very high risk for repeat.  Feel like my exam is unreliable. Will get labs and CT scan.  Apparently during CT scan the patient was moved and twisted rather roughly and started experiencing worse pain in her left paraspinal area. Also with new L1 compression fracture. Pain controlled with oral medicines. Will dc on same.   Pertinent labs & imaging results that were available during my care of the patient were reviewed by me and considered in my medical decision making (see chart for details).  ____________________________________________  FINAL CLINICAL IMPRESSION(S) / ED DIAGNOSES  Final diagnoses:  None     MEDICATIONS GIVEN DURING THIS VISIT:  Medications  iopamidol (ISOVUE-300) 61 % injection (has no administration in time range)  iopamidol (ISOVUE-300) 61 % injection 100 mL (100 mLs Intravenous Contrast Given 09/18/18 1058)  HYDROcodone-acetaminophen (NORCO/VICODIN) 5-325 MG per tablet 1 tablet (1 tablet Oral Given 09/18/18 1253)  methocarbamol (ROBAXIN) tablet 500 mg (500 mg Oral Given 09/18/18 1524)  oxyCODONE-acetaminophen (PERCOCET/ROXICET) 5-325 MG per tablet  1 tablet (1 tablet Oral Given 09/18/18 1524)     NEW OUTPATIENT MEDICATIONS STARTED DURING THIS VISIT:  New Prescriptions   No medications on file    Note:  This note was prepared with assistance of Dragon voice recognition software. Occasional wrong-word or sound-a-like substitutions may have occurred due to the inherent limitations of voice recognition software.   Merrily Pew, MD 09/18/18 [phone]

## 2018-09-18 NOTE — ED Notes (Signed)
Patient able to ambulate to restroom with one assist and the use of walker. Will inform provider.

## 2018-09-18 NOTE — ED Notes (Signed)
Pt ambulated to the bathroom and backed to her room 2x with a walker.  She had a steady gait and only complained about her back hurting.

## 2018-09-18 NOTE — ED Notes (Signed)
Bed: WA07 Expected date:  Expected time:  Means of arrival:  Comments: EMS N/V/D

## 2018-09-18 NOTE — ED Triage Notes (Signed)
Transported by [facility] from home 9/30-- n/v and tarry stools. Patient also began to experience left sided flank pain. 10/2-- patient began to have diarrhea along with more nausea.  AAO x 2 per baseline.

## 2018-09-26 ENCOUNTER — Ambulatory Visit (INDEPENDENT_AMBULATORY_CARE_PROVIDER_SITE_OTHER): Payer: Medicare Other | Admitting: Family Medicine

## 2018-09-26 ENCOUNTER — Encounter: Payer: Self-pay | Admitting: Family Medicine

## 2018-09-26 VITALS — BP 110/68 | HR 77 | Temp 98.2°F | Ht 64.0 in | Wt 175.2 lb

## 2018-09-26 DIAGNOSIS — Z7409 Other reduced mobility: Secondary | ICD-10-CM | POA: Diagnosis not present

## 2018-09-26 DIAGNOSIS — Z23 Encounter for immunization: Secondary | ICD-10-CM

## 2018-09-26 DIAGNOSIS — S32010A Wedge compression fracture of first lumbar vertebra, initial encounter for closed fracture: Secondary | ICD-10-CM | POA: Insufficient documentation

## 2018-09-26 DIAGNOSIS — K581 Irritable bowel syndrome with constipation: Secondary | ICD-10-CM

## 2018-09-26 DIAGNOSIS — Z8781 Personal history of (healed) traumatic fracture: Secondary | ICD-10-CM | POA: Insufficient documentation

## 2018-09-26 DIAGNOSIS — M48061 Spinal stenosis, lumbar region without neurogenic claudication: Secondary | ICD-10-CM

## 2018-09-26 DIAGNOSIS — I2510 Atherosclerotic heart disease of native coronary artery without angina pectoris: Secondary | ICD-10-CM

## 2018-09-26 DIAGNOSIS — M8589 Other specified disorders of bone density and structure, multiple sites: Secondary | ICD-10-CM | POA: Diagnosis not present

## 2018-09-26 DIAGNOSIS — S32010G Wedge compression fracture of first lumbar vertebra, subsequent encounter for fracture with delayed healing: Secondary | ICD-10-CM

## 2018-09-26 DIAGNOSIS — K591 Functional diarrhea: Secondary | ICD-10-CM | POA: Diagnosis not present

## 2018-09-26 NOTE — Progress Notes (Signed)
Subjective:    Patient ID: Ashlee Reyes, female    DOB: [birth year], 84 y.o.   MRN: [id number]  HPI Here for f/u of hospitalization/ also to discuss palliative care   Was seen in ED on 10/3 LLQ abdominal pain / dry heaves and dark stools (on plavix)  H/o diverticulosis  This was following constipation/then diarrhea with some otc medicine (miralax and then MOM)-causing diarrhea  Her bp went way up - 180s /112     CT scan Ct Abdomen Pelvis W Contrast  Result Date: 09/18/2018 CLINICAL DATA:  NAUSEA, VOMITING, TARSI STOOLS, LEFT-SIDED FLANK PAIN EXAM: CT ABDOMEN AND PELVIS WITH CONTRAST TECHNIQUE: Multidetector CT imaging of the abdomen and pelvis was performed using the standard protocol following bolus administration of intravenous contrast. CONTRAST:  168mL ISOVUE-300 IOPAMIDOL (ISOVUE-300) INJECTION 61% COMPARISON:  None. FINDINGS: Lower chest: No acute abnormality.  Stable cardiomegaly. Hepatobiliary: Diffuse low attenuation of the liver as can be seen with hepatic steatosis. No focal hepatic mass. No intrahepatic or extrahepatic biliary ductal dilatation. Normal gallbladder. Pancreas: Unremarkable. No pancreatic ductal dilatation or surrounding inflammatory changes. Spleen: Normal in size without focal abnormality. Adrenals/Urinary Tract: Normal adrenal glands. No urolithiasis or obstructive uropathy. Bilateral parapelvic cysts. 2.4 cm hypodense, fluid attenuating right renal mass most consistent with a cyst. Normal bladder. Stomach/Bowel: Stomach is within normal limits. Appendix appears normal. No evidence of bowel wall thickening, distention, or inflammatory changes. Sigmoid diverticulosis without evidence of diverticulitis. Vascular/Lymphatic: Aorto bi-iliac stent graft is noted in satisfactory position without change compared with 01/01/2018 no lymphadenopathy. Reproductive: Status post hysterectomy. No adnexal masses. Other: No abdominal wall hernia or abnormality. No  abdominopelvic ascites. Musculoskeletal: No aggressive osseous lesion. Degenerative disc disease with disc height loss throughout the lumbar spine with bilateral facet arthropathy and foraminal narrowing. Grade 1 anterolisthesis of L3 on L4 and L4 on L5 secondary to facet disease. L1 vertebral body compression fracture with approximately 50% height loss which has progressed compared with 01/01/2018. IMPRESSION: 1. No acute abdominal or pelvic pathology. 2. Sigmoid diverticulosis without evidence of diverticulitis. 3. L1 vertebral body compression fracture with approximately 50% height loss which has progressed compared with 01/01/2018. 4. Hepatic steatosis. 5.  Aortic Atherosclerosis (ICD10-I70.0). Electronically Signed   By: Kathreen Devoid   On: 09/18/2018 11:24    Noted L1 vertebral compression fx (progressed)- a lot of pain lying on the table  Pain control discussed  Also has spinal stenosis   Lab Results  Component Value Date   WBC 9.4 09/18/2018   HGB 13.4 09/18/2018   HCT 41.2 09/18/2018   MCV 93.4 09/18/2018   PLT 225 09/18/2018   Lab Results  Component Value Date   CREATININE 0.96 09/18/2018   BUN 16 09/18/2018   NA 140 09/18/2018   K 4.1 09/18/2018   CL 101 09/18/2018   CO2 31 09/18/2018   Lab Results  Component Value Date   ALT 12 09/18/2018   AST 17 09/18/2018   ALKPHOS 57 09/18/2018   BILITOT 1.0 09/18/2018   glucose 126 Bland urine -small Hb   Wt Readings from Last 3 Encounters:  09/26/18 175 lb 4 oz (79.5 kg)  07/14/18 171 lb 8 oz (77.8 kg)  07/08/18 171 lb 8 oz (77.8 kg)   30.08 kg/m   Vitals are stable   Still has diarrhea and constipation  Today-no bm yet  Had miralax yesterday   Back pain is improved  Taking her vitamin D for bone health   Needs a  flu shot   Her L sided abd pain is relieved after bm   Has chronic nausea   Frustrated with chronic pain / spinal stenosis  Has to use walker - can go 5 minutes at most  L sided back pain is constant    Daughter needs more help at home caring for her  Interested in palliative care  Patient Active Problem List   Diagnosis Date Noted  . Compression fracture of L1 lumbar vertebra (HCC) 09/26/2018  . Mobility impaired 09/26/2018  . History of endovascular stent graft for abdominal aortic aneurysm (AAA) 01/01/2018  . Unilateral primary osteoarthritis, left knee 07/11/2017  . Hyperlipidemia 03/12/2017  . Frequent UTI/interstitial cystitis 02/06/2016  . Hypotension 01/09/2016  . Slurred speech 06/01/2015  . Carotid stenosis 06/01/2015  . Local reaction to immunization 02/21/2015  . Encounter for Medicare annual wellness exam 02/15/2015  . Estrogen deficiency 02/15/2015  . Sinusitis, chronic 01/12/2015  . IBS (irritable bowel syndrome) 01/12/2015  . Dizziness   . Palpitations 05/13/2014  . Diarrhea 12/23/2013  . Second degree heart block 10/18/2013  . Syncope 10/17/2013  . Aneurysm of abdominal vessel ([place]) 06/25/2013  . Osteopenia 01/13/2013  . CAD (coronary artery disease)   . Chronic depression 06/03/2007  . GERD 06/02/2007  . Asthma   . Chronic interstitial cystitis   . Osteoarthritis   . Spinal stenosis of lumbar region   . History of urinary tract infection    Past Medical History:  Diagnosis Date  . AAA (abdominal aortic aneurysm) ([place])    followed by Dr Oneida Alar. Last u/s07/10 stable AAA with largest measurement 3.97 x 4.02cm.  . Allergic rhinitis, seasonal   . Asthma   . Atrial fibrillation ([place])   . CAD (coronary artery disease) 07/2009   mod by cath 08/10  . Complication of anesthesia   . Depression   . Disc disease, degenerative, lumbar or lumbosacral    SPINAL STENOSIS  . Diverticulosis   . GERD (gastroesophageal reflux disease)   . Hemorrhoids   . History of recurrent UTIs   . Hyperlipidemia   . IBS (irritable bowel syndrome)   . IC (irritable colon)    DMSO in past  . Interstitial cystitis   . Osteoarthritis    spine/spinal stenosis  . Osteopenia  2011  . PONV (postoperative nausea and vomiting)   . Skin cancer    basal cell cancer - face.   Past Surgical History:  Procedure Laterality Date  . ABDOMINAL AORTIC ENDOVASCULAR STENT GRAFT N/A 07/01/2013   Procedure: ABDOMINAL AORTIC ENDOVASCULAR STENT GRAFT- GORE;  Surgeon: Elam Dutch, MD;  Location: [facility];  Service: Vascular;  Laterality: N/A;  Ultrasound guided  . ABDOMINAL HYSTERECTOMY     partial not cancer  . BREAST SURGERY     lumpectomy  . CYSTOCELE REPAIR    . ESOPHAGOGASTRODUODENOSCOPY  09/1998   erosive esphagitis ? stricture treated 12/2002  . HERNIA REPAIR  51/8841   umbilical hernia  . RECTOCELE REPAIR    . REFRACTIVE SURGERY     for film after cataract surgery.  Marland Kitchen RIGHT COLECTOMY  1997   benign  . SKIN LESION EXCISION  05/2011   basal cell skin lesion removed.   Social History   Tobacco Use  . Smoking status: Never Smoker  . Smokeless tobacco: Never Used  Substance Use Topics  . Alcohol use: No    Alcohol/week: 0.0 standard drinks  . Drug use: No   Family History  Problem Relation Age of  Onset  . Heart disease Mother        deceased age 76 heart attack.  Marland Kitchen Heart attack Mother   . Other Mother        varicose veins  . Varicose Veins Mother   . Heart disease Father        age 70 heart attack  . Cancer Father   . Stroke Brother   . Cancer Brother   . Heart disease Brother   . Heart attack Brother   . Cancer Daughter   . Diabetes Son    Allergies  Allergen Reactions  . Amoxicillin-Pot Clavulanate Nausea And Vomiting    Has patient had a PCN reaction causing immediate rash, facial/tongue/throat swelling, SOB or lightheadedness with hypotension: No Has patient had a PCN reaction causing severe rash involving mucus membranes or skin necrosis: No Has patient had a PCN reaction that required hospitalization: No Has patient had a PCN reaction occurring within the last 10 years: No If all of the above answers are "NO", then may proceed with  Cephalosporin use.   . Avelox [Moxifloxacin] Nausea And Vomiting  . Ibuprofen Other (See Comments)    GI upset  . Keflex [Cephalexin] Other (See Comments)    abd pain / GI upset  . Nitrofurantoin Other (See Comments)    Chest pain or indigestion with nausea  . Omeprazole Nausea Only  . Prevnar [Pneumococcal 13-Val Conj Vacc] Itching    Severe local reaction with redness and itching   . Rosuvastatin Other (See Comments)    Foot pain  . Sertraline Hcl Other (See Comments)    Made her more depressed  . Tetanus Toxoids     Local redness and swelling    Current Outpatient Medications on File Prior to Visit  Medication Sig Dispense Refill  . acetaminophen (TYLENOL) 500 MG tablet Take 500 mg by mouth every 4 (four) hours as needed for mild pain or moderate pain. For pain    . albuterol (PROAIR HFA) 108 (90 Base) MCG/ACT inhaler Inhale 2 puffs into the lungs every 4 (four) hours as needed for wheezing. 1 Inhaler 2  . cetirizine (ZYRTEC) 10 MG tablet TAKE 1 TABLET BY MOUTH DAILY AS NEEDED FOR ALLERGIES 30 tablet 5  . Cholecalciferol (VITAMIN D PO) Take 1 capsule by mouth daily with lunch.     . clopidogrel (PLAVIX) 75 MG tablet Take 1 tablet (75 mg total) by mouth daily. 30 tablet 11  . fluticasone (FLONASE) 50 MCG/ACT nasal spray Place 2 sprays into both nostrils daily. 16 g 11  . fluticasone (FLOVENT HFA) 44 MCG/ACT inhaler Inhale 2 puffs into the lungs daily. 1 Inhaler 5  . LUMIGAN 0.01 % SOLN Place 1 drop into both eyes at bedtime.    Marland Kitchen NEXIUM 40 MG capsule TAKE ONE (1) CAPSULE BY MOUTH 2 TIMES DAILY BEFORE A MEAL (Patient taking differently: Take 40 mg by mouth See admin instructions. TAKE ONE (1) CAPSULE every morning. Takes a second dose later in the day as needed for heartburn.) 60 capsule 11  . ondansetron (ZOFRAN ODT) 4 MG disintegrating tablet Take 1 tablet (4 mg total) by mouth every 8 (eight) hours as needed for nausea or vomiting. 20 tablet 1  . Polyethyl Glycol-Propyl Glycol  (SYSTANE OP) Place 1 drop into both eyes every 8 (eight) hours as needed (dry eyes).     . polyethylene glycol (MIRALAX / GLYCOLAX) packet Take 17 g by mouth daily as needed for mild constipation.     Marland Kitchen  Probiotic Product (ALIGN) 4 MG CAPS Take 4 mg by mouth daily.    . traMADol (ULTRAM) 50 MG tablet TAKE 1 TABLET(50 MG) BY MOUTH EVERY 8 HOURS AS NEEDED. MAY TAKE WITH TYLENOL 500 MG EACH TIME (Patient taking differently: Take 50 mg by mouth every 8 (eight) hours as needed for moderate pain. Take with Tylenol 500mg  each time.) 30 tablet 0  . trimethoprim (TRIMPEX) 100 MG tablet Take 1 tablet (100 mg total) by mouth 2 (two) times daily. (Patient taking differently: Take 100 mg by mouth daily with lunch. ) 60 tablet 5   No current facility-administered medications on file prior to visit.     Review of Systems  Constitutional: Positive for fatigue. Negative for activity change, appetite change, fever and unexpected weight change.  HENT: Negative for congestion, ear pain, rhinorrhea, sinus pressure and sore throat.   Eyes: Negative for pain, redness and visual disturbance.  Respiratory: Negative for cough, shortness of breath and wheezing.   Cardiovascular: Negative for chest pain and palpitations.  Gastrointestinal: Positive for constipation, diarrhea and nausea. Negative for abdominal pain, blood in stool, rectal pain and vomiting.  Endocrine: Negative for polydipsia and polyuria.  Genitourinary: Positive for frequency and urgency. Negative for dysuria.       Intermittent dysuria with IC  Musculoskeletal: Positive for arthralgias, back pain and gait problem. Negative for joint swelling and myalgias.  Skin: Negative for pallor and rash.  Allergic/Immunologic: Negative for environmental allergies.  Neurological: Negative for dizziness, syncope and headaches.  Hematological: Negative for adenopathy. Does not bruise/bleed easily.  Psychiatric/Behavioral: Positive for dysphoric mood. Negative for  decreased concentration. The patient is not nervous/anxious.        Objective:   Physical Exam  Constitutional: She appears well-developed and well-nourished. No distress.  Frail appearing elderly female in wheelchair  HENT:  Head: Normocephalic and atraumatic.  Mouth/Throat: Oropharynx is clear and moist.  Eyes: Pupils are equal, round, and reactive to light. Conjunctivae and EOM are normal. No scleral icterus.  Neck: Normal range of motion. Neck supple. No JVD present.  Cardiovascular: Normal rate, regular rhythm and normal heart sounds.  Pulmonary/Chest: Effort normal and breath sounds normal. No stridor. No respiratory distress. She has no wheezes. She has no rales.  Abdominal: Soft. Bowel sounds are normal. She exhibits no distension and no mass. There is tenderness. There is no rebound and no guarding. No hernia.  Mild lower abd pain- with no rebound or guarding or mass  Musculoskeletal: She exhibits no edema.  Limited rom of spine/knees/hips Some tenderness low T and L spine  Mild kyphosis    Lymphadenopathy:    She has no cervical adenopathy.  Neurological: She is alert. She displays normal reflexes. No cranial nerve deficit. Coordination normal.  Unable to walk w/o assistance    Skin: Skin is warm and dry. No rash noted. No erythema. No pallor.  Psychiatric: She exhibits a depressed mood.  Fatigued and frustrated- somewhat depressed affect However pleasant and candid when disc mood and symptoms           Assessment & Plan:   Problem List Items Addressed This Visit      Digestive   IBS (irritable bowel syndrome)    Recent ED visit with episode of LLQ pain / vomiting and dark stool in setting of h/o diverticulosis  Reviewed hospital records, lab results and studies in detail   MOM causes diarrhea  bp was high-improved today  CT is reassuring (GI wise)  Disc imp  of control of constipation on regular basis  Recommend good fluid intake  miralax daily or more  often as needed to achieve daily bm  Also stool softener if needed Avoid stimulant medications for constipation        Musculoskeletal and Integument   Compression fracture of L1 lumbar vertebra (HCC) - Primary    Pain has improved since hospital visit  Declines tx for OP  Improved now -does not need additional tx  This affects mobility along with spinal stenosis with is frustrating and creates care issues  Family asked about palliative care Enc vitamin D for bone health      Osteopenia    Recent L1 comp fx Pt still declines further eval or tx         Other   RESOLVED: Diarrhea    After MOM  Resolved now       Mobility impaired    With spinal stenosis and L1 compression fx Frustrating to pt and family  Desires comfort and help with care/ADLS Interested in palliative care      Spinal stenosis of lumbar region (Chronic)    Worsening pain  Not operable at her age        Other Visit Diagnoses    Need for influenza vaccination       Relevant Orders   Flu Vaccine QUAD 6+ mos PF IM (Fluarix Quad PF) (Completed)

## 2018-09-26 NOTE — Patient Instructions (Addendum)
Stool softeners and miralax are fine Stay on miralax every day Add stool softener when needed   Give 5000 iu of vitamin D every day   Keep up a good fluid intake   Blood pressure and vital signs are improved Kermit Balo

## 2018-09-28 NOTE — Assessment & Plan Note (Signed)
Recent ED visit with episode of LLQ pain / vomiting and dark stool in setting of h/o diverticulosis  Reviewed hospital records, lab results and studies in detail   MOM causes diarrhea  bp was high-improved today  CT is reassuring (GI wise)  Disc imp of control of constipation on regular basis  Recommend good fluid intake  miralax daily or more often as needed to achieve daily bm  Also stool softener if needed Avoid stimulant medications for constipation

## 2018-09-28 NOTE — Assessment & Plan Note (Signed)
Worsening pain  Not operable at her age

## 2018-09-28 NOTE — Assessment & Plan Note (Signed)
Recent L1 comp fx Pt still declines further eval or tx

## 2018-09-28 NOTE — Assessment & Plan Note (Signed)
With spinal stenosis and L1 compression fx Frustrating to pt and family  Desires comfort and help with care/ADLS Interested in palliative care

## 2018-09-28 NOTE — Assessment & Plan Note (Signed)
After MOM  Resolved now

## 2018-09-28 NOTE — Assessment & Plan Note (Signed)
Pain has improved since hospital visit  Declines tx for OP  Improved now -does not need additional tx  This affects mobility along with spinal stenosis with is frustrating and creates care issues  Family asked about palliative care Enc vitamin D for bone health

## 2018-09-29 ENCOUNTER — Telehealth: Payer: Self-pay | Admitting: Family Medicine

## 2018-09-29 DIAGNOSIS — M48061 Spinal stenosis, lumbar region without neurogenic claudication: Secondary | ICD-10-CM

## 2018-09-29 DIAGNOSIS — Z7409 Other reduced mobility: Secondary | ICD-10-CM

## 2018-09-29 DIAGNOSIS — S32010G Wedge compression fracture of first lumbar vertebra, subsequent encounter for fracture with delayed healing: Secondary | ICD-10-CM

## 2018-09-29 NOTE — Telephone Encounter (Signed)
Palliative care ref done Will send to [facility]

## 2018-09-30 NOTE — Telephone Encounter (Signed)
Called Ashlee Reyes and gave her the information about the Palliative Care Referral and to expect a phone call from them.

## 2018-10-07 DIAGNOSIS — Z515 Encounter for palliative care: Secondary | ICD-10-CM | POA: Diagnosis not present

## 2018-10-07 DIAGNOSIS — M48061 Spinal stenosis, lumbar region without neurogenic claudication: Secondary | ICD-10-CM | POA: Diagnosis not present

## 2018-10-08 ENCOUNTER — Telehealth: Payer: Self-pay | Admitting: Family Medicine

## 2018-10-08 NOTE — Telephone Encounter (Signed)
Copied from [place] [id number]. Topic: Quick Communication - Rx Refill/Question >> Oct 08, 2018 12:47 PM R[place]ford Nail, [place] wrote: **Patient's daughter calling and states that the pharmacy will not refill this medication. States that the bottle that she has states that there is 1 refill before 09/17/18. States that they have sent the request last week, but has not heard back. Please advise.**  Medication: clopidogrel (PLAVIX) 75 MG tablet   Has the patient contacted their pharmacy? Yes.   (Agent: If no, request that the patient contact the pharmacy for the refill.) (Agent: If yes, when and what did the pharmacy advise?)  Preferred Pharmacy (with phone number or street name): [facility] [place], [place]: Please be advised that RX refills may take up to 3 business days. We ask that you follow-up with your pharmacy.

## 2018-10-08 NOTE — Telephone Encounter (Signed)
Noted last RF on Clopidogrel 75 mg on 07/14/18;#30; refills x 11.  Called [facility] in [place].  Spoke with the Pharmacist and rec'd confirmation of above Rx on file.  Phone call to pt's daughter, [name].  Advised that there are refills on Clopidogrel through July 2020, and an Rx is being prepared for pick up.  Daughter verb. understanding.  Stated she will call the pharmacy to confirm that a prescription is ready.

## 2018-10-13 ENCOUNTER — Telehealth: Payer: Self-pay | Admitting: Family Medicine

## 2018-10-13 MED ORDER — TRAMADOL HCL 50 MG PO TABS: ORAL_TABLET | ORAL | 3 refills | Status: DC

## 2018-10-13 NOTE — Telephone Encounter (Signed)
Request from palliative med to change tramadol instructions to 50 mg 1/2 pill q am and 1 qhs  Can also take prn every 8 hours   I will print to send to pharmacy [place]

## 2018-10-14 NOTE — Telephone Encounter (Signed)
Note faxed to [facility]osp[facility] and Rx sent to pharmacy

## 2018-11-06 ENCOUNTER — Other Ambulatory Visit (HOSPITAL_COMMUNITY): Payer: Medicare Other

## 2018-11-06 ENCOUNTER — Ambulatory Visit: Payer: Medicare Other | Admitting: Vascular Surgery

## 2018-11-17 ENCOUNTER — Encounter

## 2018-11-17 ENCOUNTER — Encounter: Payer: Self-pay | Admitting: Family Medicine

## 2018-11-17 ENCOUNTER — Ambulatory Visit (INDEPENDENT_AMBULATORY_CARE_PROVIDER_SITE_OTHER): Payer: Medicare Other | Admitting: Family Medicine

## 2018-11-17 ENCOUNTER — Other Ambulatory Visit: Payer: Medicare Other

## 2018-11-17 VITALS — BP 150/70 | HR 87 | Temp 98.3°F | Ht 64.0 in | Wt 179.1 lb

## 2018-11-17 DIAGNOSIS — I2510 Atherosclerotic heart disease of native coronary artery without angina pectoris: Secondary | ICD-10-CM

## 2018-11-17 DIAGNOSIS — J4541 Moderate persistent asthma with (acute) exacerbation: Secondary | ICD-10-CM

## 2018-11-17 DIAGNOSIS — M79602 Pain in left arm: Secondary | ICD-10-CM | POA: Diagnosis not present

## 2018-11-17 DIAGNOSIS — J22 Unspecified acute lower respiratory infection: Secondary | ICD-10-CM | POA: Diagnosis not present

## 2018-11-17 MED ORDER — BENZONATATE 100 MG PO CAPS: 100.0000 mg | ORAL_CAPSULE | Freq: Three times a day (TID) | ORAL | 0 refills | Status: DC | PRN

## 2018-11-17 MED ORDER — AZITHROMYCIN 250 MG PO TABS: ORAL_TABLET | ORAL | 0 refills | Status: DC

## 2018-11-17 NOTE — Progress Notes (Signed)
Subjective:    Patient ID: Ashlee Reyes, female    DOB: 1921-09-17, 84 y.o.   MRN: [id number]  HPI This is a 84 yo, accompanied by her daughter Ashlee Reyes, who presents today with cough x 1 week. Started with throat clearing. Some sore throat. Little nasal drainage. Ears cleaned of wax at hearing aid provider today. Phlegm hanging into throat. No fever. Feels SOB, some wheezing. Decreased appetite. Generally good fluid intake. No known sick contacts.  Palliative care nurse concerned about CHF. They are starting daily weights.  Cough all day, sleeping ok. Has had some Delsym with a little relief, honey/lemon, cough drops.  Left upper arm pain for several weeks, no known trauma, takes Tylenol and Tramadol with some relief.   Past Medical History:  Diagnosis Date  . AAA (abdominal aortic aneurysm) ([place])    followed by Dr Oneida Alar. Last u/s07/10 stable AAA with largest measurement 3.97 x 4.02cm.  . Allergic rhinitis, seasonal   . Asthma   . Atrial fibrillation ([place])   . CAD (coronary artery disease) 07/2009   mod by cath 08/10  . Complication of anesthesia   . Depression   . Disc disease, degenerative, lumbar or lumbosacral    SPINAL STENOSIS  . Diverticulosis   . GERD (gastroesophageal reflux disease)   . Hemorrhoids   . History of recurrent UTIs   . Hyperlipidemia   . IBS (irritable bowel syndrome)   . IC (irritable colon)    DMSO in past  . Interstitial cystitis   . Osteoarthritis    spine/spinal stenosis  . Osteopenia 2011  . PONV (postoperative nausea and vomiting)   . Skin cancer    basal cell cancer - face.   Past Surgical History:  Procedure Laterality Date  . ABDOMINAL AORTIC ENDOVASCULAR STENT GRAFT N/A 07/01/2013   Procedure: ABDOMINAL AORTIC ENDOVASCULAR STENT GRAFT- GORE;  Surgeon: Elam Dutch, MD;  Location: [place];  Service: Vascular;  Laterality: N/A;  Ultrasound guided  . ABDOMINAL HYSTERECTOMY     partial not cancer  . BREAST SURGERY     lumpectomy    . CYSTOCELE REPAIR    . ESOPHAGOGASTRODUODENOSCOPY  09/1998   erosive esphagitis ? stricture treated 12/2002  . HERNIA REPAIR  70/0174   umbilical hernia  . RECTOCELE REPAIR    . REFRACTIVE SURGERY     for film after cataract surgery.  Marland Kitchen RIGHT COLECTOMY  1997   benign  . SKIN LESION EXCISION  05/2011   basal cell skin lesion removed.   Family History  Problem Relation Age of Onset  . Heart disease Mother        deceased age 69 heart attack.  Marland Kitchen Heart attack Mother   . Other Mother        varicose veins  . Varicose Veins Mother   . Heart disease Father        age 105 heart attack  . Cancer Father   . Stroke Brother   . Cancer Brother   . Heart disease Brother   . Heart attack Brother   . Cancer Daughter   . Diabetes Son    Social History   Tobacco Use  . Smoking status: Never Smoker  . Smokeless tobacco: Never Used  Substance Use Topics  . Alcohol use: No    Alcohol/week: 0.0 standard drinks  . Drug use: No      Review of Systems Per HPI    Objective:   Physical Exam  Constitutional: She  is oriented to person, place, and time. She appears well-developed and well-nourished. No distress.  HENT:  Head: Normocephalic and atraumatic.  Right Ear: Tympanic membrane, external ear and ear canal normal.  Left Ear: Tympanic membrane, external ear and ear canal normal.  Nose: Rhinorrhea (small amount) present.  Mouth/Throat: Uvula is midline, oropharynx is clear and moist and mucous membranes are normal.  Eyes: Conjunctivae are normal.  Cardiovascular: Normal rate, regular rhythm and normal heart sounds.  Pulmonary/Chest: Effort normal and breath sounds normal. No stridor. No respiratory distress. She has no wheezes. She has no rales.  Very frequent, hacking cough.   Musculoskeletal:  Left shoulder with good ROM, no neck, trapezius or shoulder tenderness. Left bicep mildly tender. Left radial pulse strong, arm/hand warm, no discoloration or edema.   Neurological: She  is alert and oriented to person, place, and time.  Skin: Skin is warm and dry. She is not diaphoretic.  Psychiatric: She has a normal mood and affect. Her behavior is normal. Judgment and thought content normal.  Vitals reviewed.        BP (!) 150/70 (BP Location: Right Arm, Patient Position: Sitting, Cuff Size: Large)   Pulse 87   Temp 98.3 F (36.8 C) (Oral)   Ht 5\' 4"  (1.626 m)   Wt 179 lb 1.9 oz (81.2 kg)   LMP 12/17/1968   SpO2 97%   BMI 30.75 kg/m  Wt Readings from Last 3 Encounters:  11/17/18 179 lb 1.9 oz (81.2 kg)  09/26/18 175 lb 4 oz (79.5 kg)  07/14/18 171 lb 8 oz (77.8 kg)    Assessment & Plan:  1. Lower respiratory infection - Provided written and verbal information regarding diagnosis and treatment. -  Patient Instructions  Good to see you today  I have sent in a prescription for a cough pill as well as an antibiotic  Please take some plain Mucinex (generic is fine) to help break up your phlegm  Drink enough water to make your urine light yellow  If not better in 48 hours, please call the office, may need to add prednisone   - azithromycin (ZITHROMAX) 250 MG tablet; Take 2 tabs PO x 1 dose, then 1 tab PO QD x 4 days  Dispense: 6 tablet; Refill: 0 - benzonatate (TESSALON) 100 MG capsule; Take 1-2 capsules (100-200 mg total) by mouth 3 (three) times daily as needed.  Dispense: 30 capsule; Refill: 0 - albuterol inhaler every 4 hours as needed  2. Left arm pain - seems to be more in bicep than shoulder, continue current meds she takes for her chronic back pain, can add topical pain relievers as needed   Clarene Reamer, FNP-BC  [facility], [place]  11/17/2018 3:08 PM

## 2018-11-17 NOTE — Telephone Encounter (Signed)
A user error has taken place: encounter opened in error, closed for administrative reasons.

## 2018-11-17 NOTE — Patient Instructions (Addendum)
Good to see you today  I have sent in a prescription for a cough pill as well as an antibiotic  Please take some plain Mucinex (generic is fine) to help break up your phlegm  Drink enough water to make your urine light yellow  If not better in 48 hours, please call the office, may need to add prednisone

## 2018-11-17 NOTE — Progress Notes (Signed)
PATIENT NAME: Ashlee Reyes DOB: 18-Dec-1920 MRN: [id number]  PRIMARY CARE PROVIDER: Abner Greenspan, MD  RESPONSIBLE PARTY:  Acct ID - Guarantor Home Phone Work Phone Relationship Acct Type  [id number] Max [phone]  Self P/F     [address], [place], [place] [address]    PLAN OF CARE and INTERVENTIONS:               1.  GOALS OF CARE/ ADVANCE CARE PLANNING:  Keep patient at home and free from illness as much as possible               2.  PATIENT/CAREGIVER EDUCATION:  Discussed early signs of respiratory infection including fever, chills, cough with colored phlegm. Review getting daily weights to assess weight gain and fluid.                 4. PERSONAL EMERGENCY PLAN:  Daughter lives with patient and cares for her daily               5.  DISEASE STATUS:Patient developed cough about 1 week ago that has progressed. Daughter reports patient is coughing more and more and gets more SOB with slight exertion.  No known fever.  Pt using inhaler more often with little relief.  Lungs have a few scattered rhonchi. LLE 2 + edema and RLE 1 + edema. RN contacted Dr. Vella Kohler office and scheduled MD visit for evaluation for today 11/17/18 at 2:15. Daughter will plan to take patient this afternoon.  Encouraged daughter to contact us if needed.   HISTORY OF PRESENT ILLNESS:    CODE STATUS:   Code Status: Prior  ADVANCED DIRECTIVES: N MOST FORM:  PPS:    PHYSICAL EXAM:   VITALS: Today's Vitals   11/17/18 1200  BP: (!) 148/72  Pulse: 81  Resp: 19  Temp: (!) 96 F (35.6 C)  TempSrc: Oral  SpO2: 99%    LUNGS: Scattered Rhonchi CARDIAC:  Regular EXTREMITIES: LLE 2 + edema, RLE 1 + edema SKIN: intact and dry  NEURO: negative except for memory problems, pleasantly confused at times, repetitive questions       Maricela Curet, RN

## 2018-11-19 ENCOUNTER — Other Ambulatory Visit: Payer: Self-pay | Admitting: Family Medicine

## 2018-11-19 ENCOUNTER — Telehealth: Payer: Self-pay

## 2018-11-19 MED ORDER — PREDNISONE 10 MG PO TABS: ORAL_TABLET | ORAL | 0 refills | Status: DC

## 2018-11-19 NOTE — Telephone Encounter (Signed)
Please call patient's daughter and let her know that I sent in a prescription for prednisone. Take with food and a full glass of water. If not better in 5-7 days, please follow up.

## 2018-11-19 NOTE — Telephone Encounter (Signed)
Ashlee Reyes (DPR signed) pt was seen 11/17/18; pt has non prod cough, wheezing and SOB that has not improved. Ashlee Reyes said there was discussion at office visit about pt getting prednisone if not better. Still no fever. [facility] [address]

## 2018-11-19 NOTE — Telephone Encounter (Signed)
Called and spoke with Ashlee Reyes patients daughter informing her of note. Understanding verbalized nothing further needed at this time.

## 2018-11-20 ENCOUNTER — Telehealth: Payer: Self-pay | Admitting: Family Medicine

## 2018-11-20 NOTE — Telephone Encounter (Signed)
Pt's daughter is calling and stated pt is needing a prescription for seat lift chair. Please call pt's daughter to confirm details.

## 2018-11-21 NOTE — Telephone Encounter (Signed)
Order done and in IN box  

## 2018-11-24 NOTE — Telephone Encounter (Signed)
Order faxed and then mailed to pt

## 2018-11-24 NOTE — Telephone Encounter (Signed)
Spoke to pts daughter who is requesting order be faxed to Etta Quill at [facility] [phone] and also be mailed to pts homes[facility]

## 2018-12-02 ENCOUNTER — Other Ambulatory Visit: Payer: Medicare Other

## 2018-12-02 DIAGNOSIS — J4541 Moderate persistent asthma with (acute) exacerbation: Secondary | ICD-10-CM

## 2018-12-03 NOTE — Progress Notes (Signed)
PATIENT NAME: Ashlee Reyes DOB: 1921-10-15 MRN: [id number]  PRIMARY CARE PROVIDER: Abner Greenspan, MD  RESPONSIBLE PARTY:  Acct ID - Guarantor Home Phone Work Phone Relationship Acct Type  [id number] Ashlee [phone]  Self P/F     [address], [place], [place] [address]    PLAN OF CARE and INTERVENTIONS:               1.  GOALS OF CARE/ ADVANCE CARE PLANNING:  Remain in home with daughter. / Remain free from infection                2.  PATIENT/CAREGIVER EDUCATION:  Education on signs and symptoms of infection, cough, fever, shortness of breath               4. PERSONAL EMERGENCY PLAN:  In Place               5.  DISEASE STATUS: Worsening Dementia   HISTORY OF PRESENT ILLNESS:    CODE STATUS: Unknown  ADVANCED DIRECTIVES:Unknown MOST FORM: Unknown PPS: 40%   PHYSICAL EXAM:   VITALS:There were no vitals filed for this visit.  LUNGS: clear to auscultation , decreased breath sounds CARDIAC: Cor irreg, irreg RRR}  EXTREMITIES: 1+ and non-pitting edema SKIN: Skin color, texture, turgor normal. No rashes or lesions or mobility and turgor normal, nails clear without discoloration or sign of infection, nails normal without clubbing, no evidence of bleeding or bruising, no lesions noted and temperature normal  NEURO: positive for memory problems   Palliative Care visit made to assess Ashlee Reyes.  Patient continues to live with her daughter in daughters home.  Patient memory is worsening and patent unable to provide answers regarding her PMH.  Daughter reports patient completed RX for Z-pak and Prednisone.  Patient continues to have a chronic non productive cough.  Daughter reports cough is worse in the AM.  Daughter prepares patient tea with honey and lemon and this decreases patient's cough per daughter. Daughter reports patient does have shortness of breath at times.  Patient is eating well per daughter.  Patient having no difficulty with constipation at the present  time.  Patient's heart rate irregular.  Patient uses rollator walker when ambulating or hold onto furniture per daughter.  Patient has not suffered any falls.  Daughter reports patient does complain of back and neck pain at times.  Daughter reports patient takes 0.5 tablet of Tramadol in the AM and 1 tab in the PM.  RN reviewed patient's meds with daughter.  Patient has had flu shot.  Daughter encouraged to contact Palliative Care Team with questions or concerns.              Ashlee Simmer, RN

## 2018-12-04 ENCOUNTER — Encounter: Payer: Self-pay | Admitting: Vascular Surgery

## 2018-12-04 ENCOUNTER — Other Ambulatory Visit: Payer: Self-pay

## 2018-12-04 ENCOUNTER — Ambulatory Visit (HOSPITAL_COMMUNITY)
Admission: RE | Admit: 2018-12-04 | Discharge: 2018-12-04 | Disposition: A | Discharge status: Home / Self Care | Payer: Medicare Other | Source: Ambulatory Visit | Attending: Vascular Surgery | Admitting: Vascular Surgery

## 2018-12-04 ENCOUNTER — Ambulatory Visit (INDEPENDENT_AMBULATORY_CARE_PROVIDER_SITE_OTHER): Payer: Medicare Other | Admitting: Vascular Surgery

## 2018-12-04 VITALS — BP 128/77 | HR 67 | Resp 16 | Ht 64.0 in | Wt 179.0 lb

## 2018-12-04 DIAGNOSIS — I714 Abdominal aortic aneurysm, without rupture, unspecified: Secondary | ICD-10-CM

## 2018-12-04 DIAGNOSIS — I2510 Atherosclerotic heart disease of native coronary artery without angina pectoris: Secondary | ICD-10-CM | POA: Diagnosis not present

## 2018-12-04 NOTE — Progress Notes (Signed)
Patient is a 84 year old female who returns for follow-up after endovascular stent graft repair 2014.  She still is able to perform most of her daily activities independently.  She sits during the day most of the day.  She occasionally has some depression.  She is mainly frustrated that she cannot do all the activities that she used to be able to do.  She has also initiated palliative care visits once per month to deal with her chronic back pain.  She currently lives with her daughter.  Review of systems: She tires easily.  She denies shortness of breath.  She denies chest pain.  Physical exam:  Vitals:   12/04/18 0838  BP: 128/77  Pulse: 67  Resp: 16  SpO2: 98%  Weight: 179 lb (81.2 kg)  Height: 5\' 4"  (1.626 m)   Neck: No carotid bruits  Chest: Clear to auscultation bilaterally  Cardiac: Regular rate and rhythm  Data: Patient had a duplex ultrasound of her abdominal aorta today.  Aortic diameter was 3.7 cm which is unchanged over the last 3 years  Assessment: Patient doing well status post endovascular stent graft repair of abdominal aortic aneurysm.  Plan: Patient will follow-up in 18 months with repeat aortic ultrasound.  Ruta Hinds, MD [facility] Office: [phone] Pager: [phone]

## 2018-12-21 ENCOUNTER — Other Ambulatory Visit: Payer: Self-pay | Admitting: Allergy and Immunology

## 2018-12-22 ENCOUNTER — Telehealth: Payer: Self-pay | Admitting: Allergy and Immunology

## 2018-12-22 ENCOUNTER — Telehealth: Payer: Self-pay

## 2018-12-22 MED ORDER — FLUTICASONE PROPIONATE HFA 44 MCG/ACT IN AERO: 2.0000 | INHALATION_SPRAY | Freq: Every day | RESPIRATORY_TRACT | 0 refills | Status: DC

## 2018-12-22 MED ORDER — FLUTICASONE FUROATE 200 MCG/ACT IN AEPB: 1.0000 | INHALATION_SPRAY | Freq: Every day | RESPIRATORY_TRACT | 5 refills | Status: DC

## 2018-12-22 MED ORDER — BECLOMETHASONE DIPROP HFA 80 MCG/ACT IN AERB: 2.0000 | INHALATION_SPRAY | Freq: Two times a day (BID) | RESPIRATORY_TRACT | 0 refills | Status: DC

## 2018-12-22 NOTE — Telephone Encounter (Signed)
Pt daughter called and said hat they need to have Qvar 80 called into [facility] on [address]. [phone] or [phone].

## 2018-12-22 NOTE — Telephone Encounter (Signed)
Patient cannot tolerate fluticasone states it makes her feel bad and refuses to take it

## 2018-12-22 NOTE — Telephone Encounter (Signed)
Informed patient's daughter that Flovent was sent in because it was preferred on patient's insurance. Daughter stated that Flovent makes patient sick and that's why she requested we send in Qvar. Qvar was sent into pharmacy will be looking for prior authorization request to come over to complete.

## 2018-12-22 NOTE — Telephone Encounter (Signed)
Please have her use Arnuity 200 - 1 inhalation 1 time per day to replace Qvar

## 2018-12-22 NOTE — Telephone Encounter (Signed)
Is there another alternate to inhaled fluticasone?

## 2018-12-22 NOTE — Telephone Encounter (Signed)
Sent in Parral called pharmacy to cancel order

## 2018-12-22 NOTE — Telephone Encounter (Signed)
Pharmacy sent fax stating that Qvar Redihaler is not covered.  Preferred alternatives are Arunity Ellipta and Flovent Diskus.  Okay to change to preferred inhaler?

## 2018-12-22 NOTE — Addendum Note (Signed)
Addended by: Horris Latino on: 12/22/2018 12:21 PM   Modules accepted: Orders

## 2018-12-23 ENCOUNTER — Emergency Department (HOSPITAL_COMMUNITY): Payer: Medicare Other

## 2018-12-23 ENCOUNTER — Inpatient Hospital Stay (HOSPITAL_COMMUNITY)
Admission: EM | Admit: 2018-12-23 | Discharge: 2018-12-26 | DRG: 312 | Disposition: A | Discharge status: Skilled Nursing Facility | Payer: Medicare Other | Attending: Internal Medicine | Admitting: Internal Medicine

## 2018-12-23 ENCOUNTER — Encounter (HOSPITAL_COMMUNITY): Payer: Self-pay | Admitting: Emergency Medicine

## 2018-12-23 ENCOUNTER — Inpatient Hospital Stay (HOSPITAL_COMMUNITY): Payer: Medicare Other

## 2018-12-23 DIAGNOSIS — S32010D Wedge compression fracture of first lumbar vertebra, subsequent encounter for fracture with routine healing: Secondary | ICD-10-CM | POA: Diagnosis not present

## 2018-12-23 DIAGNOSIS — I4891 Unspecified atrial fibrillation: Secondary | ICD-10-CM | POA: Diagnosis present

## 2018-12-23 DIAGNOSIS — K5900 Constipation, unspecified: Secondary | ICD-10-CM | POA: Diagnosis not present

## 2018-12-23 DIAGNOSIS — Z881 Allergy status to other antibiotic agents status: Secondary | ICD-10-CM | POA: Diagnosis not present

## 2018-12-23 DIAGNOSIS — G3184 Mild cognitive impairment, so stated: Secondary | ICD-10-CM | POA: Diagnosis present

## 2018-12-23 DIAGNOSIS — I739 Peripheral vascular disease, unspecified: Secondary | ICD-10-CM | POA: Diagnosis not present

## 2018-12-23 DIAGNOSIS — Z7951 Long term (current) use of inhaled steroids: Secondary | ICD-10-CM | POA: Diagnosis not present

## 2018-12-23 DIAGNOSIS — R270 Ataxia, unspecified: Secondary | ICD-10-CM | POA: Diagnosis not present

## 2018-12-23 DIAGNOSIS — R55 Syncope and collapse: Secondary | ICD-10-CM | POA: Diagnosis not present

## 2018-12-23 DIAGNOSIS — M1990 Unspecified osteoarthritis, unspecified site: Secondary | ICD-10-CM | POA: Diagnosis not present

## 2018-12-23 DIAGNOSIS — Z7902 Long term (current) use of antithrombotics/antiplatelets: Secondary | ICD-10-CM

## 2018-12-23 DIAGNOSIS — Z66 Do not resuscitate: Secondary | ICD-10-CM | POA: Diagnosis present

## 2018-12-23 DIAGNOSIS — R41841 Cognitive communication deficit: Secondary | ICD-10-CM | POA: Diagnosis not present

## 2018-12-23 DIAGNOSIS — I951 Orthostatic hypotension: Secondary | ICD-10-CM | POA: Diagnosis present

## 2018-12-23 DIAGNOSIS — I160 Hypertensive urgency: Secondary | ICD-10-CM | POA: Diagnosis present

## 2018-12-23 DIAGNOSIS — K219 Gastro-esophageal reflux disease without esophagitis: Secondary | ICD-10-CM | POA: Diagnosis present

## 2018-12-23 DIAGNOSIS — Z8673 Personal history of transient ischemic attack (TIA), and cerebral infarction without residual deficits: Secondary | ICD-10-CM

## 2018-12-23 DIAGNOSIS — M2550 Pain in unspecified joint: Secondary | ICD-10-CM | POA: Diagnosis not present

## 2018-12-23 DIAGNOSIS — R42 Dizziness and giddiness: Secondary | ICD-10-CM | POA: Diagnosis not present

## 2018-12-23 DIAGNOSIS — J449 Chronic obstructive pulmonary disease, unspecified: Secondary | ICD-10-CM | POA: Diagnosis present

## 2018-12-23 DIAGNOSIS — I2510 Atherosclerotic heart disease of native coronary artery without angina pectoris: Secondary | ICD-10-CM | POA: Diagnosis present

## 2018-12-23 DIAGNOSIS — R29818 Other symptoms and signs involving the nervous system: Secondary | ICD-10-CM | POA: Diagnosis not present

## 2018-12-23 DIAGNOSIS — Z79899 Other long term (current) drug therapy: Secondary | ICD-10-CM | POA: Diagnosis not present

## 2018-12-23 DIAGNOSIS — Z880 Allergy status to penicillin: Secondary | ICD-10-CM | POA: Diagnosis not present

## 2018-12-23 DIAGNOSIS — Z7401 Bed confinement status: Secondary | ICD-10-CM | POA: Diagnosis not present

## 2018-12-23 DIAGNOSIS — R0902 Hypoxemia: Secondary | ICD-10-CM | POA: Diagnosis not present

## 2018-12-23 DIAGNOSIS — M6281 Muscle weakness (generalized): Secondary | ICD-10-CM | POA: Diagnosis not present

## 2018-12-23 DIAGNOSIS — I6503 Occlusion and stenosis of bilateral vertebral arteries: Secondary | ICD-10-CM | POA: Diagnosis not present

## 2018-12-23 DIAGNOSIS — H409 Unspecified glaucoma: Secondary | ICD-10-CM | POA: Diagnosis not present

## 2018-12-23 DIAGNOSIS — Z85828 Personal history of other malignant neoplasm of skin: Secondary | ICD-10-CM | POA: Diagnosis not present

## 2018-12-23 DIAGNOSIS — I10 Essential (primary) hypertension: Secondary | ICD-10-CM | POA: Diagnosis not present

## 2018-12-23 DIAGNOSIS — G8929 Other chronic pain: Secondary | ICD-10-CM | POA: Diagnosis present

## 2018-12-23 DIAGNOSIS — M1712 Unilateral primary osteoarthritis, left knee: Secondary | ICD-10-CM | POA: Diagnosis not present

## 2018-12-23 DIAGNOSIS — I6522 Occlusion and stenosis of left carotid artery: Secondary | ICD-10-CM | POA: Diagnosis not present

## 2018-12-23 DIAGNOSIS — J45909 Unspecified asthma, uncomplicated: Secondary | ICD-10-CM | POA: Diagnosis not present

## 2018-12-23 DIAGNOSIS — Z87440 Personal history of urinary (tract) infections: Secondary | ICD-10-CM | POA: Diagnosis not present

## 2018-12-23 DIAGNOSIS — M542 Cervicalgia: Secondary | ICD-10-CM | POA: Diagnosis present

## 2018-12-23 DIAGNOSIS — R1310 Dysphagia, unspecified: Secondary | ICD-10-CM | POA: Diagnosis not present

## 2018-12-23 DIAGNOSIS — R112 Nausea with vomiting, unspecified: Secondary | ICD-10-CM | POA: Diagnosis not present

## 2018-12-23 DIAGNOSIS — K589 Irritable bowel syndrome without diarrhea: Secondary | ICD-10-CM | POA: Diagnosis present

## 2018-12-23 DIAGNOSIS — R4781 Slurred speech: Secondary | ICD-10-CM | POA: Diagnosis not present

## 2018-12-23 DIAGNOSIS — H04123 Dry eye syndrome of bilateral lacrimal glands: Secondary | ICD-10-CM | POA: Diagnosis not present

## 2018-12-23 DIAGNOSIS — Z95828 Presence of other vascular implants and grafts: Secondary | ICD-10-CM | POA: Diagnosis not present

## 2018-12-23 DIAGNOSIS — J309 Allergic rhinitis, unspecified: Secondary | ICD-10-CM | POA: Diagnosis not present

## 2018-12-23 DIAGNOSIS — R262 Difficulty in walking, not elsewhere classified: Secondary | ICD-10-CM | POA: Diagnosis not present

## 2018-12-23 DIAGNOSIS — R2681 Unsteadiness on feet: Secondary | ICD-10-CM | POA: Diagnosis not present

## 2018-12-23 DIAGNOSIS — E785 Hyperlipidemia, unspecified: Secondary | ICD-10-CM | POA: Diagnosis present

## 2018-12-23 DIAGNOSIS — R278 Other lack of coordination: Secondary | ICD-10-CM | POA: Diagnosis not present

## 2018-12-23 DIAGNOSIS — M8580 Other specified disorders of bone density and structure, unspecified site: Secondary | ICD-10-CM | POA: Diagnosis not present

## 2018-12-23 DIAGNOSIS — R410 Disorientation, unspecified: Secondary | ICD-10-CM | POA: Diagnosis not present

## 2018-12-23 DIAGNOSIS — Z888 Allergy status to other drugs, medicaments and biological substances status: Secondary | ICD-10-CM

## 2018-12-23 DIAGNOSIS — G9341 Metabolic encephalopathy: Secondary | ICD-10-CM | POA: Diagnosis not present

## 2018-12-23 LAB — RAPID URINE DRUG SCREEN, HOSP PERFORMED
Amphetamines: NOT DETECTED
Barbiturates: NOT DETECTED
Benzodiazepines: NOT DETECTED
Cocaine: NOT DETECTED
Opiates: NOT DETECTED
Tetrahydrocannabinol: NOT DETECTED

## 2018-12-23 LAB — TROPONIN I: Troponin I: <0.03 ng/mL (ref <0.03)

## 2018-12-23 LAB — COMPREHENSIVE METABOLIC PANEL
ALBUMIN: 3.6 g/dL (ref 3.5–5.0)
ALT: 13 U/L (ref 0–44)
AST: 18 U/L (ref 15–41)
Alkaline Phosphatase: 54 U/L (ref 38–126)
Anion gap: 8 (ref 5–15)
BUN: 8 mg/dL (ref 8–23)
CO2: 26 mmol/L (ref 22–32)
CREATININE: 0.9 mg/dL (ref 0.44–1.00)
Calcium: 9.2 mg/dL (ref 8.9–10.3)
Chloride: 104 mmol/L (ref 98–111)
GFR calc Af Amer: >60 mL/min (ref >60)
GFR calc non Af Amer: 54 mL/min — ABNORMAL LOW (ref >60)
Glucose, Bld: 114 mg/dL — ABNORMAL HIGH (ref 70–99)
Potassium: 4 mmol/L (ref 3.5–5.1)
Sodium: 138 mmol/L (ref 135–145)
Total Bilirubin: 0.6 mg/dL (ref 0.3–1.2)
Total Protein: 6.8 g/dL (ref 6.5–8.1)

## 2018-12-23 LAB — DIFFERENTIAL
Abs Immature Granulocytes: 0.06 10*3/uL (ref 0.00–0.07)
Basophils Absolute: 0.1 10*3/uL (ref 0.0–0.1)
Basophils Relative: 1 %
Eosinophils Absolute: 0 10*3/uL (ref 0.0–0.5)
Eosinophils Relative: 0 %
Immature Granulocytes: 1 %
Lymphocytes Relative: 13 %
Lymphs Abs: 1 10*3/uL (ref 0.7–4.0)
Monocytes Absolute: 0.3 10*3/uL (ref 0.1–1.0)
Monocytes Relative: 4 %
NEUTROS ABS: 6.2 10*3/uL (ref 1.7–7.7)
Neutrophils Relative %: 81 %

## 2018-12-23 LAB — PROTIME-INR
INR: 0.97
Prothrombin Time: 12.8 seconds (ref 11.4–15.2)

## 2018-12-23 LAB — CBC
HCT: 44 % (ref 36.0–46.0)
HEMOGLOBIN: 13.5 g/dL (ref 12.0–15.0)
MCH: 27.8 pg (ref 26.0–34.0)
MCHC: 30.7 g/dL (ref 30.0–36.0)
MCV: 90.7 fL (ref 80.0–100.0)
Platelets: 298 10*3/uL (ref 150–400)
RBC: 4.85 MIL/uL (ref 3.87–5.11)
RDW: 13.4 % (ref 11.5–15.5)
WBC: 7.7 10*3/uL (ref 4.0–10.5)
nRBC: 0 % (ref 0.0–0.2)

## 2018-12-23 LAB — URINALYSIS, ROUTINE W REFLEX MICROSCOPIC
Bilirubin Urine: NEGATIVE
Glucose, UA: NEGATIVE mg/dL
Ketones, ur: NEGATIVE mg/dL
Leukocytes, UA: NEGATIVE
Nitrite: NEGATIVE
Protein, ur: NEGATIVE mg/dL
Specific Gravity, Urine: 1.008 (ref 1.005–1.030)
pH: 8 (ref 5.0–8.0)

## 2018-12-23 LAB — I-STAT CHEM 8, ED
BUN: 9 mg/dL (ref 8–23)
CREATININE: 0.9 mg/dL (ref 0.44–1.00)
Calcium, Ion: 1.2 mmol/L (ref 1.15–1.40)
Chloride: 103 mmol/L (ref 98–111)
Glucose, Bld: 112 mg/dL — ABNORMAL HIGH (ref 70–99)
HCT: 43 % (ref 36.0–46.0)
Hemoglobin: 14.6 g/dL (ref 12.0–15.0)
Potassium: 4.1 mmol/L (ref 3.5–5.1)
Sodium: 140 mmol/L (ref 135–145)
TCO2: 26 mmol/L (ref 22–32)

## 2018-12-23 LAB — I-STAT TROPONIN, ED: Troponin i, poc: 0 ng/mL (ref 0.00–0.08)

## 2018-12-23 LAB — ETHANOL

## 2018-12-23 LAB — APTT: aPTT: 29 seconds (ref 24–36)

## 2018-12-23 MED ORDER — SODIUM CHLORIDE 0.9% FLUSH: 3.0000 mL | INTRAVENOUS | Status: DC | PRN

## 2018-12-23 MED ORDER — DIAZEPAM 2 MG PO TABS
2.0000 mg | ORAL_TABLET | Freq: Once | ORAL | Status: AC
  Administered 2018-12-23: 2 mg via ORAL
  Filled 2018-12-23: qty 1

## 2018-12-23 MED ORDER — ONDANSETRON HCL 4 MG/2ML IJ SOLN: 4.0000 mg | Freq: Four times a day (QID) | INTRAMUSCULAR | Status: DC | PRN

## 2018-12-23 MED ORDER — ONDANSETRON HCL 4 MG PO TABS: 4.0000 mg | ORAL_TABLET | Freq: Four times a day (QID) | ORAL | Status: DC | PRN

## 2018-12-23 MED ORDER — DIMENHYDRINATE 50 MG PO TABS
25.0000 mg | ORAL_TABLET | Freq: Three times a day (TID) | ORAL | Status: DC | PRN
  Filled 2018-12-23: qty 1

## 2018-12-23 MED ORDER — BENZONATATE 100 MG PO CAPS: 100.0000 mg | ORAL_CAPSULE | Freq: Three times a day (TID) | ORAL | Status: DC | PRN

## 2018-12-23 MED ORDER — IOPAMIDOL (ISOVUE-370) INJECTION 76%
100.0000 mL | Freq: Once | INTRAVENOUS | Status: AC | PRN
  Administered 2018-12-23: 100 mL via INTRAVENOUS

## 2018-12-23 MED ORDER — RISAQUAD PO CAPS
1.0000 | ORAL_CAPSULE | Freq: Every day | ORAL | Status: DC
  Administered 2018-12-25 – 2018-12-26 (×2): 1 via ORAL
  Filled 2018-12-23 (×3): qty 1

## 2018-12-23 MED ORDER — SODIUM CHLORIDE 0.9 % IV BOLUS
500.0000 mL | Freq: Once | INTRAVENOUS | Status: AC
  Administered 2018-12-23: 500 mL via INTRAVENOUS

## 2018-12-23 MED ORDER — HEPARIN SODIUM (PORCINE) 5000 UNIT/ML IJ SOLN
5000.0000 [IU] | Freq: Three times a day (TID) | INTRAMUSCULAR | Status: DC
  Administered 2018-12-24 – 2018-12-26 (×6): 5000 [IU] via SUBCUTANEOUS
  Filled 2018-12-23 (×6): qty 1

## 2018-12-23 MED ORDER — FENTANYL CITRATE (PF) 100 MCG/2ML IJ SOLN
50.0000 ug | Freq: Once | INTRAMUSCULAR | Status: AC
  Administered 2018-12-23: 50 ug via INTRAVENOUS
  Filled 2018-12-23: qty 2

## 2018-12-23 MED ORDER — FLUTICASONE PROPIONATE 50 MCG/ACT NA SUSP
2.0000 | Freq: Every day | NASAL | Status: DC
  Filled 2018-12-23: qty 16

## 2018-12-23 MED ORDER — ALIGN 4 MG PO CAPS: 4.0000 mg | ORAL_CAPSULE | Freq: Every day | ORAL | Status: DC

## 2018-12-23 MED ORDER — LATANOPROST 0.005 % OP SOLN
1.0000 [drp] | Freq: Every day | OPHTHALMIC | Status: DC
  Filled 2018-12-23: qty 2.5

## 2018-12-23 MED ORDER — POLYETHYL GLYCOL-PROPYL GLYCOL 0.4-0.3 % OP GEL: Freq: Three times a day (TID) | OPHTHALMIC | Status: DC | PRN

## 2018-12-23 MED ORDER — PANTOPRAZOLE SODIUM 40 MG PO TBEC
40.0000 mg | DELAYED_RELEASE_TABLET | Freq: Every day | ORAL | Status: DC
  Administered 2018-12-25 – 2018-12-26 (×2): 40 mg via ORAL
  Filled 2018-12-23 (×4): qty 1

## 2018-12-23 MED ORDER — POLYVINYL ALCOHOL 1.4 % OP SOLN
1.0000 [drp] | OPHTHALMIC | Status: DC | PRN
  Filled 2018-12-23: qty 15

## 2018-12-23 MED ORDER — SODIUM CHLORIDE 0.9% FLUSH
3.0000 mL | Freq: Two times a day (BID) | INTRAVENOUS | Status: DC
  Administered 2018-12-23 – 2018-12-25 (×5): 3 mL via INTRAVENOUS

## 2018-12-23 MED ORDER — ONDANSETRON 4 MG PO TBDP
4.0000 mg | ORAL_TABLET | Freq: Three times a day (TID) | ORAL | Status: DC | PRN
  Administered 2018-12-23 – 2018-12-24 (×2): 4 mg via ORAL
  Filled 2018-12-23 (×2): qty 1

## 2018-12-23 MED ORDER — FLUTICASONE FUROATE 200 MCG/ACT IN AEPB: 1.0000 | INHALATION_SPRAY | Freq: Every day | RESPIRATORY_TRACT | Status: DC

## 2018-12-23 MED ORDER — SODIUM CHLORIDE 0.9 % IV SOLN: 250.0000 mL | INTRAVENOUS | Status: DC | PRN

## 2018-12-23 MED ORDER — LORATADINE 10 MG PO TABS
10.0000 mg | ORAL_TABLET | Freq: Every day | ORAL | Status: DC
  Administered 2018-12-25 – 2018-12-26 (×2): 10 mg via ORAL
  Filled 2018-12-23 (×4): qty 1

## 2018-12-23 MED ORDER — OXYCODONE HCL 5 MG PO TABS
5.0000 mg | ORAL_TABLET | Freq: Four times a day (QID) | ORAL | Status: DC | PRN
  Administered 2018-12-23: 5 mg via ORAL
  Filled 2018-12-23: qty 1

## 2018-12-23 MED ORDER — CLOPIDOGREL BISULFATE 75 MG PO TABS
75.0000 mg | ORAL_TABLET | Freq: Every day | ORAL | Status: DC
  Administered 2018-12-25 – 2018-12-26 (×2): 75 mg via ORAL
  Filled 2018-12-23 (×4): qty 1

## 2018-12-23 NOTE — ED Notes (Signed)
Pt complained of feeling nauseous when standing. Pt experienced difficulty standing. Pt needed two staff assist while standing.

## 2018-12-23 NOTE — ED Notes (Signed)
Daughter reports pt lives with her and until recently has been very independent.  Position changed for comfort, pt began to get dizzy while seated on side of bed.  Pt placed back in supine position, tolerated well.

## 2018-12-23 NOTE — Telephone Encounter (Addendum)
medication approved awaiting fax. Called home number left vm on answering machine to call back

## 2018-12-23 NOTE — ED Triage Notes (Signed)
Pt arrives via EMS from home with reports of syncopal episode where pt fell onto couch and lasted about 15-20 seconds. Pt reports taking dramamine and zofran at 715 for dizziness and nausea. Pt endorses neck pain. Family reports pt BP spiked up today and pt does not have hx of HTN. Pt was dizzy yesterday too so family pushed fluids and Pedialyte.

## 2018-12-23 NOTE — H&P (Signed)
[facility]                                                                                    Patient Demographics  Ashlee Reyes, is a 85 y.o. female  CSN: [id number]  MRN: [id number]  DOB - [birth year]  Admit Date - 12/23/2018  Outpatient Primary MD for the patient is Tower, Wynelle Fanny, MD   With History of -  Past Medical History:  Diagnosis Date  . AAA (abdominal aortic aneurysm) ([place])    followed by Dr Oneida Alar. Last u/s07/10 stable AAA with largest measurement 3.97 x 4.02cm.  . Allergic rhinitis, seasonal   . Asthma   . Atrial fibrillation ([place])   . CAD (coronary artery disease) 07/2009   mod by cath 08/10  . Complication of anesthesia   . Depression   . Disc disease, degenerative, lumbar or lumbosacral    SPINAL STENOSIS  . Diverticulosis   . GERD (gastroesophageal reflux disease)   . Hemorrhoids   . History of recurrent UTIs   . Hyperlipidemia   . IBS (irritable bowel syndrome)   . IC (irritable colon)    DMSO in past  . Interstitial cystitis   . Osteoarthritis    spine/spinal stenosis  . Osteopenia 2011  . PONV (postoperative nausea and vomiting)   . Skin cancer    basal cell cancer - face.      Past Surgical History:  Procedure Laterality Date  . ABDOMINAL AORTIC ENDOVASCULAR STENT GRAFT N/A 07/01/2013   Procedure: ABDOMINAL AORTIC ENDOVASCULAR STENT GRAFT- GORE;  Surgeon: Elam Dutch, MD;  Location: [place];  Service: Vascular;  Laterality: N/A;  Ultrasound guided  . ABDOMINAL HYSTERECTOMY     partial not cancer  . BREAST SURGERY     lumpectomy  . CYSTOCELE REPAIR    . ESOPHAGOGASTRODUODENOSCOPY  09/1998   erosive esphagitis ? stricture treated 12/2002  . HERNIA REPAIR  17/5102   umbilical hernia  . RECTOCELE REPAIR    . REFRACTIVE SURGERY     for film after cataract surgery.  Marland Kitchen RIGHT COLECTOMY  1997   benign  . SKIN LESION EXCISION  05/2011   basal cell skin lesion removed.    in for   Chief Complaint  Patient  presents with  . Dizziness  . Loss of Consciousness     HPI  Ashlee Reyes  is a 85 y.o. female, with past medical history significant for asthma, A. fib and CAD presenting with 2 days history of dizziness and vertigo and uncontrolled blood pressure.  The patient lives at home with intermittent care from her daughter.  Patient denies any chest pains or palpitations.  There was report of nausea.  The patient has chronic lower back pain however started having pain in her shoulders in the last couple of days.  No shortness of breath or cough no focal weaknesses. Work-up both in the emergency room which included CT of the head and neck with angiogram was negative, EKG and troponins were basically nonrevealing .  Patient is highly active for her age and usually takes care of herself.  Family at bedside .  Family noted that  she had a syncopal episode for 20 seconds today after which she woke up coherent.  No reports of seizures    Review of Systems    In addition to the HPI above,  No Fever-chills, No problems swallowing food or Liquids, No Chest pain, Cough or Shortness of Breath, No Abdominal pain, No Nausea or Vommitting, Bowel movements are regular, No Blood in stool or Urine, No dysuria, No new skin rashes or bruises, No new joints pains-aches,  No recent weight gain or loss, No polyuria, polydypsia or polyphagia, No significant Mental Stressors.  A full 10 point Review of Systems was done, except as stated above, all other Review of Systems were negative.   Social History Social History   Tobacco Use  . Smoking status: Never Smoker  . Smokeless tobacco: Never Used  Substance Use Topics  . Alcohol use: No    Alcohol/week: 0.0 standard drinks     Family History Family History  Problem Relation Age of Onset  . Heart disease Mother        deceased age 35 heart attack.  Marland Kitchen Heart attack Mother   . Other Mother        varicose veins  . Varicose Veins Mother   . Heart  disease Father        age 81 heart attack  . Cancer Father   . Stroke Brother   . Cancer Brother   . Heart disease Brother   . Heart attack Brother   . Cancer Daughter   . Diabetes Son      Prior to Admission medications   Medication Sig Start Date End Date Taking? Authorizing Provider  acetaminophen (TYLENOL) 500 MG tablet Take 500 mg by mouth every 4 (four) hours as needed for mild pain.    Yes [provider]  albuterol (PROAIR HFA) 108 (90 Base) MCG/ACT inhaler Inhale 2 puffs into the lungs every 4 (four) hours as needed for wheezing. 09/10/17  Yes Kozlow, Donnamarie Poag, MD  beclomethasone (QVAR REDIHALER) 80 MCG/ACT inhaler Inhale 2 puffs into the lungs 2 (two) times daily. 12/22/18  Yes Kozlow, Donnamarie Poag, MD  cetirizine (ZYRTEC) 10 MG tablet TAKE 1 TABLET BY MOUTH DAILY AS NEEDED FOR ALLERGIES Patient taking differently: Take 10 mg by mouth daily.  06/10/18  Yes Kozlow, Donnamarie Poag, MD  clopidogrel (PLAVIX) 75 MG tablet Take 1 tablet (75 mg total) by mouth daily. 07/14/18  Yes Tower, Wynelle Fanny, MD  dimenhyDRINATE (DRAMAMINE PO) Take 1 tablet by mouth as needed (dizziness).   Yes [provider]  docusate sodium (COLACE) 100 MG capsule Take 100 mg by mouth every evening.   Yes [provider]  fluticasone (FLONASE) 50 MCG/ACT nasal spray Place 2 sprays into both nostrils daily. 07/14/18  Yes Tower, Marne A, MD  LUMIGAN 0.01 % SOLN Place 1 drop into both eyes at bedtime. 12/18/17  Yes [provider]  NEXIUM 40 MG capsule TAKE ONE (1) CAPSULE BY MOUTH 2 TIMES DAILY BEFORE A MEAL Patient taking differently: Take 40 mg by mouth See admin instructions. TAKE ONE (1) CAPSULE every morning. Takes a second dose later in the day as needed for heartburn. 07/14/18  Yes Tower, Wynelle Fanny, MD  ondansetron (ZOFRAN ODT) 4 MG disintegrating tablet Take 1 tablet (4 mg total) by mouth every 8 (eight) hours as needed for nausea or vomiting. 09/15/18  Yes Tower, Wynelle Fanny, MD  Polyethyl Glycol-Propyl  Glycol (SYSTANE OP) Place 1 drop into both eyes every 8 (  eight) hours as needed (dry eyes).    Yes [provider]  polyethylene glycol (MIRALAX / GLYCOLAX) packet Take 17 g by mouth daily as needed for mild constipation.    Yes [provider]  Probiotic Product (ALIGN) 4 MG CAPS Take 4 mg by mouth daily.   Yes [provider]  traMADol (ULTRAM) 50 MG tablet Take 1/2 pill by mouth each am and 1 tab QHS Patient taking differently: Take 25-50 mg by mouth See admin instructions. Take 1/2 pill by mouth each am and 1 tab QHS 10/13/18  Yes Tower, Wynelle Fanny, MD  trimethoprim (TRIMPEX) 100 MG tablet Take 1 tablet (100 mg total) by mouth 2 (two) times daily. Patient taking differently: Take 100 mg by mouth daily with lunch.  03/07/17  Yes Tower, Wynelle Fanny, MD  benzonatate (TESSALON) 100 MG capsule Take 1-2 capsules (100-200 mg total) by mouth 3 (three) times daily as needed. Patient not taking: Reported on 12/23/2018 11/17/18   Elby Beck, FNP  fluticasone (FLOVENT HFA) 44 MCG/ACT inhaler Inhale 2 puffs into the lungs daily. Patient not taking: Reported on 12/23/2018 12/22/18   Jiles Prows, MD  Fluticasone Furoate (ARNUITY ELLIPTA) 200 MCG/ACT AEPB Inhale 1 puff into the lungs daily. Patient not taking: Reported on 12/23/2018 12/22/18   Jiles Prows, MD    Allergies  Allergen Reactions  . Amoxicillin-Pot Clavulanate Nausea And Vomiting    Has patient had a PCN reaction causing immediate rash, facial/tongue/throat swelling, SOB or lightheadedness with hypotension: No Has patient had a PCN reaction causing severe rash involving mucus membranes or skin necrosis: No Has patient had a PCN reaction that required hospitalization: No Has patient had a PCN reaction occurring within the last 10 years: No If all of the above answers are "NO", then may proceed with Cephalosporin use.   . Avelox [Moxifloxacin] Nausea And Vomiting  . Flovent Hfa [Fluticasone] Other (See Comments)     "makes her feel funny" per family member  . Ibuprofen Other (See Comments)    GI upset  . Keflex [Cephalexin] Other (See Comments)    abd pain / GI upset  . Nitrofurantoin Other (See Comments)    Chest pain or indigestion with nausea  . Omeprazole Nausea Only  . Prevnar [Pneumococcal 13-Val Conj Vacc] Itching    Severe local reaction with redness and itching   . Rosuvastatin Other (See Comments)    Foot pain  . Sertraline Hcl Other (See Comments)    Made her more depressed  . Tetanus Toxoids     Local redness and swelling     Physical Exam  Vitals  Blood pressure (!) 150/68, pulse 82, temperature 98.4 F (36.9 C), temperature source Oral, resp. rate 16, last menstrual period 12/17/1968, SpO2 94 %.   1. General elderly lady, well-developed, well-nourished, extremely pleasant  2. Normal affect and insight, Not Suicidal or Homicidal, Awake Alert, Oriented X 3.  3. No F.N deficits, grossly, patient moving all extremities.  4. Ears and Eyes appear Normal, Conjunctivae clear, PERRLA. Moist Oral Mucosa.  5. Supple Neck, No JVD, No cervical lymphadenopathy appriciated, No Carotid Bruits.  6. Symmetrical Chest wall movement, Good air movement bilaterally, CTAB.  7. RRR, No Gallops, Rubs or Murmurs, No Parasternal Heave.  8. Positive Bowel Sounds, Abdomen Soft, Non tender,  9.  No Cyanosis, Normal Skin Turgor, No Skin Rash or Bruise.  10. Good muscle tone,  joints appear normal , no effusions, Normal ROM.    Data  Review  CBC Recent Labs  Lab 12/23/18 0957 12/23/18 1007  WBC 7.7  --   HGB 13.5 14.6  HCT 44.0 43.0  PLT 298  --   MCV 90.7  --   MCH 27.8  --   MCHC 30.7  --   RDW 13.4  --   LYMPHSABS 1.0  --   MONOABS 0.3  --   EOSABS 0.0  --   BASOSABS 0.1  --    ------------------------------------------------------------------------------------------------------------------  Chemistries  Recent Labs  Lab 12/23/18 0957 12/23/18 1007  NA 138 140  K 4.0  4.1  CL 104 103  CO2 26  --   GLUCOSE 114* 112*  BUN 8 9  CREATININE 0.90 0.90  CALCIUM 9.2  --   AST 18  --   ALT 13  --   ALKPHOS 54  --   BILITOT 0.6  --    ------------------------------------------------------------------------------------------------------------------ CrCl cannot be calculated (Unknown ideal weight.). ------------------------------------------------------------------------------------------------------------------ No results for input(s): TSH, T4TOTAL, T3FREE, THYROIDAB in the last 72 hours.  Invalid input(s): FREET3   Coagulation profile Recent Labs  Lab 12/23/18 0957  INR 0.97   ------------------------------------------------------------------------------------------------------------------- No results for input(s): DDIMER in the last 72 hours. -------------------------------------------------------------------------------------------------------------------  Cardiac Enzymes No results for input(s): CKMB, TROPONINI, MYOGLOBIN in the last 168 hours.  Invalid input(s): CK ------------------------------------------------------------------------------------------------------------------ Invalid input(s): POCBNP   ---------------------------------------------------------------------------------------------------------------  Urinalysis    Component Value Date/Time   COLORURINE STRAW (A) 12/23/2018 [place] 12/23/2018 0943   LABSPEC 1.008 12/23/2018 0943   PHURINE 8.0 12/23/2018 0943   GLUCOSEU NEGATIVE 12/23/2018 0943   HGBUR MODERATE (A) 12/23/2018 0943   HGBUR small 07/28/2009 1142   BILIRUBINUR NEGATIVE 12/23/2018 0943   BILIRUBINUR negative 08/20/2018 1058   KETONESUR NEGATIVE 12/23/2018 0943   PROTEINUR NEGATIVE 12/23/2018 0943   UROBILINOGEN 0.2 08/20/2018 1058   UROBILINOGEN 0.2 01/11/2015 1128   NITRITE NEGATIVE 12/23/2018 0943   LEUKOCYTESUR NEGATIVE 12/23/2018 0943     ----------------------------------------------------------------------------------------------------------------  ABG     Imaging results:   Ct Angio Head W Or Wo Contrast  Result Date: 12/23/2018 CLINICAL DATA:  Focal neuro deficit greater than 6 hours. Suspect stroke. Aphasia, syncope EXAM: CT ANGIOGRAPHY HEAD AND NECK TECHNIQUE: Multidetector CT imaging of the head and neck was performed using the standard protocol during bolus administration of intravenous contrast. Multiplanar CT image reconstructions and MIPs were obtained to evaluate the vascular anatomy. Carotid stenosis measurements (when applicable) are obtained utilizing NASCET criteria, using the distal internal carotid diameter as the denominator. CONTRAST:  154mL ISOVUE-370 IOPAMIDOL (ISOVUE-370) INJECTION 76% COMPARISON:  CT head 12/23/2018 FINDINGS: CTA NECK FINDINGS Aortic arch: Atherosclerotic aortic arch. Atherosclerotic disease and mild stenosis in the subclavian artery bilaterally. Right carotid system: Mild atherosclerotic calcification right carotid bifurcation without significant stenosis. Left carotid system: Mild stenosis distal left common carotid artery. Mild atherosclerotic disease left internal carotid artery without stenosis. Vertebral arteries: Both vertebral arteries widely patent in the neck. Skeleton: No acute skeletal abnormality. Other neck: Negative for mass or adenopathy Upper chest: 2 cm sub solid density right upper lobe. Follow-up recommended. Probable scarring in the right upper lobe anteriorly and in the left upper lobe. Review of the MIP images confirms the above findings CTA HEAD FINDINGS Anterior circulation: Atherosclerotic calcification and mild stenosis in the cavernous carotid bilaterally. Anterior and middle cerebral arteries patent bilaterally without significant stenosis or occlusion Posterior circulation: Mild stenosis distal vertebral artery bilaterally. Basilar widely patent. Right PICA patent.  Left PICA not visualized.  Superior cerebellar and posterior cerebral arteries patent bilaterally without stenosis. Venous sinuses: Patent Anatomic variants: None Delayed phase: Normal enhancement on delayed imaging. Review of the MIP images confirms the above findings IMPRESSION: 1. Negative for emergent large vessel occlusion 2. Mild stenosis distal left common carotid artery. Carotid bifurcation mildly disease bilaterally without significant stenosis. Mild atherosclerotic stenosis in the cavernous carotid bilaterally. 3. Mild stenosis distal vertebral artery bilaterally. 4. 2 cm sub solid density right upper lobe. Recommend CT chest at this time for baseline evaluation. This could be due to scarring or neoplasm. Electronically Signed   By: Franchot Gallo M.D.   On: 12/23/2018 13:25   Dg Chest 2 View  Result Date: 12/23/2018 CLINICAL DATA:  Syncope. EXAM: CHEST - 2 VIEW COMPARISON:  Chest x-ray dated January 01, 2018. FINDINGS: Stable mild cardiomegaly. Normal pulmonary vascularity. Atherosclerotic calcification of the aortic arch. No focal consolidation, pleural effusion, or pneumothorax. No acute osseous abnormality. Chronic L1 compression deformity. IMPRESSION: No active cardiopulmonary disease. Electronically Signed   By: Titus Dubin M.D.   On: 12/23/2018 13:31   Ct Head Wo Contrast  Result Date: 12/23/2018 CLINICAL DATA:  Ataxia with stroke suspected.  Syncope. EXAM: CT HEAD WITHOUT CONTRAST TECHNIQUE: Contiguous axial images were obtained from the base of the skull through the vertex without intravenous contrast. COMPARISON:  02/20/2018 FINDINGS: Brain: No evidence of acute infarction, hemorrhage, hydrocephalus, extra-axial collection or mass lesion/mass effect. Small remote right cerebellar infarct. Mild for age cerebral volume loss. Stable pattern of chronic small vessel ischemia in the deep cerebral white matter. Vascular: Atherosclerotic calcification Skull: No acute finding Sinuses/Orbits:  Bilateral cataract resection.  No acute finding. IMPRESSION: No acute finding or change from prior. Electronically Signed   By: Monte Fantasia M.D.   On: 12/23/2018 10:49   Ct Angio Neck W And/or Wo Contrast  Result Date: 12/23/2018 CLINICAL DATA:  Focal neuro deficit greater than 6 hours. Suspect stroke. Aphasia, syncope EXAM: CT ANGIOGRAPHY HEAD AND NECK TECHNIQUE: Multidetector CT imaging of the head and neck was performed using the standard protocol during bolus administration of intravenous contrast. Multiplanar CT image reconstructions and MIPs were obtained to evaluate the vascular anatomy. Carotid stenosis measurements (when applicable) are obtained utilizing NASCET criteria, using the distal internal carotid diameter as the denominator. CONTRAST:  141mL ISOVUE-370 IOPAMIDOL (ISOVUE-370) INJECTION 76% COMPARISON:  CT head 12/23/2018 FINDINGS: CTA NECK FINDINGS Aortic arch: Atherosclerotic aortic arch. Atherosclerotic disease and mild stenosis in the subclavian artery bilaterally. Right carotid system: Mild atherosclerotic calcification right carotid bifurcation without significant stenosis. Left carotid system: Mild stenosis distal left common carotid artery. Mild atherosclerotic disease left internal carotid artery without stenosis. Vertebral arteries: Both vertebral arteries widely patent in the neck. Skeleton: No acute skeletal abnormality. Other neck: Negative for mass or adenopathy Upper chest: 2 cm sub solid density right upper lobe. Follow-up recommended. Probable scarring in the right upper lobe anteriorly and in the left upper lobe. Review of the MIP images confirms the above findings CTA HEAD FINDINGS Anterior circulation: Atherosclerotic calcification and mild stenosis in the cavernous carotid bilaterally. Anterior and middle cerebral arteries patent bilaterally without significant stenosis or occlusion Posterior circulation: Mild stenosis distal vertebral artery bilaterally. Basilar widely  patent. Right PICA patent. Left PICA not visualized. Superior cerebellar and posterior cerebral arteries patent bilaterally without stenosis. Venous sinuses: Patent Anatomic variants: None Delayed phase: Normal enhancement on delayed imaging. Review of the MIP images confirms the above findings IMPRESSION: 1. Negative for emergent large vessel occlusion 2.  Mild stenosis distal left common carotid artery. Carotid bifurcation mildly disease bilaterally without significant stenosis. Mild atherosclerotic stenosis in the cavernous carotid bilaterally. 3. Mild stenosis distal vertebral artery bilaterally. 4. 2 cm sub solid density right upper lobe. Recommend CT chest at this time for baseline evaluation. This could be due to scarring or neoplasm. Electronically Signed   By: Franchot Gallo M.D.   On: 12/23/2018 13:25   Vas [place] Evar Duplex  Result Date: 12/04/2018 ABDOMINAL AORTA STUDY Indications: Follow up exam for EVAR. Surgery date 07/01/2013. Limitations: Patient discomfort and air/bowel gas.  Performing Technologist: Ronal Fear RVS, RCS  Examination Guidelines: A complete evaluation includes B-mode imaging, spectral Doppler, color Doppler, and power Doppler as needed of all accessible portions of each vessel. Bilateral testing is considered an integral part of a complete examination. Limited examinations for reoccurring indications may be performed as noted.  Endovascular Aortic Repair (EVAR): +----------+----------------+-------------------+-------------------+           Diameter AP (cm)Diameter Trans (cm)Velocities (cm/sec) +----------+----------------+-------------------+-------------------+ Aorta     3.72            3.74               66                  +----------+----------------+-------------------+-------------------+ Right Limb                                                       +----------+----------------+-------------------+-------------------+ Left Limb                                                         +----------+----------------+-------------------+-------------------+  Summary: Abdominal Aorta: Patent endovascular aneurysm repair with no evidence of endoleak where visualized. Technically difficult exam due to patient intolerance to probe pressure and excessive bowel gas.  *See table(s) above for measurements and observations.  Electronically signed by Ruta Hinds MD on 12/04/2018 at 1:45:56 PM.   Final     My personal review of EKG: Rhythm NSR, prolonged PR interval?  Ectopic rhythm, no acute changes    Assessment & Plan  Syncope/dizziness -negative CT angiogram of the head Continue with Antivert Monitor troponins Check MRI of brain  Hyperlipidemia Continue statins  IBS  Chronic back pain Continue with as needed Oxy  COPD Continue with nebulizer      DVT Prophylaxis   AM Labs Ordered, also please review Full Orders  Family Communication: Admission, patients condition and plan of care including tests being ordered have been discussed with the patient and daughter who indicate understanding and agree with the plan and Code Status.  Code Status DNR  Disposition Plan: Home  Time spent in minutes : 40 minutes  Condition GUARDED   @SIGNATURE @

## 2018-12-23 NOTE — ED Notes (Addendum)
Pt has pure wick but urinated on stretcher, pt cleaned and placed back on pure wick

## 2018-12-23 NOTE — Telephone Encounter (Signed)
Patient's daughter called back advised of approval for Qvar Redihaler 80

## 2018-12-23 NOTE — Telephone Encounter (Signed)
No there isnt we can try to do a PA if you would like

## 2018-12-23 NOTE — Telephone Encounter (Signed)
Send in [place]. Adverse response to inhaled fluticasone. Inform patient.

## 2018-12-23 NOTE — ED Provider Notes (Signed)
[facility] Provider Note   CSN: [id number] Arrival date & time: 12/23/18  0920     History   Chief Complaint Chief Complaint  Patient presents with  . Dizziness  . Loss of Consciousness    HPI Ashlee Reyes is a 85 y.o. female.  HPI  85 year old female presents with dizziness and hypertension.  History is mostly taken from the daughter.  Patient lives at home and yesterday started feeling a little dizzy and not quite right.  When they checked her blood pressure was elevated but they do not remember the number.  This morning the patient was more dizzy upon waking and seemed to be stumbling and not able to walk straight.  Was also complaining of nausea.  She does have a history of vertigo and was given Dramamine and ondansetron.  At one point she was sitting on the couch and rubbing her neck and saying her neck and shoulders were hurting and then briefly passed out for about 20 seconds.  Was caught by the daughter and then awoke.  She is feeling better from a dizziness standpoint but still has a little bit of discomfort in her neck, which started yesterday mildly.  No chest pain or shortness of breath.  No focal weakness.  Past Medical History:  Diagnosis Date  . AAA (abdominal aortic aneurysm) ([place])    followed by Dr Oneida Alar. Last u/s07/10 stable AAA with largest measurement 3.97 x 4.02cm.  . Allergic rhinitis, seasonal   . Asthma   . Atrial fibrillation ([place])   . CAD (coronary artery disease) 07/2009   mod by cath 08/10  . Complication of anesthesia   . Depression   . Disc disease, degenerative, lumbar or lumbosacral    SPINAL STENOSIS  . Diverticulosis   . GERD (gastroesophageal reflux disease)   . Hemorrhoids   . History of recurrent UTIs   . Hyperlipidemia   . IBS (irritable bowel syndrome)   . IC (irritable colon)    DMSO in past  . Interstitial cystitis   . Osteoarthritis    spine/spinal stenosis  . Osteopenia 2011  .  PONV (postoperative nausea and vomiting)   . Skin cancer    basal cell cancer - face.    Patient Active Problem List   Diagnosis Date Noted  . Compression fracture of L1 lumbar vertebra (HCC) 09/26/2018  . Mobility impaired 09/26/2018  . History of endovascular stent graft for abdominal aortic aneurysm (AAA) 01/01/2018  . Unilateral primary osteoarthritis, left knee 07/11/2017  . Hyperlipidemia 03/12/2017  . Frequent UTI/interstitial cystitis 02/06/2016  . Hypotension 01/09/2016  . Slurred speech 06/01/2015  . Carotid stenosis 06/01/2015  . Local reaction to immunization 02/21/2015  . Encounter for Medicare annual wellness exam 02/15/2015  . Estrogen deficiency 02/15/2015  . Sinusitis, chronic 01/12/2015  . IBS (irritable bowel syndrome) 01/12/2015  . Dizziness   . Palpitations 05/13/2014  . Second degree heart block 10/18/2013  . Syncope 10/17/2013  . Aneurysm of abdominal vessel ([place]) 06/25/2013  . Osteopenia 01/13/2013  . CAD (coronary artery disease)   . Chronic depression 06/03/2007  . GERD 06/02/2007  . Asthma   . Chronic interstitial cystitis   . Osteoarthritis   . Spinal stenosis of lumbar region   . History of urinary tract infection     Past Surgical History:  Procedure Laterality Date  . ABDOMINAL AORTIC ENDOVASCULAR STENT GRAFT N/A 07/01/2013   Procedure: ABDOMINAL AORTIC ENDOVASCULAR STENT GRAFT- GORE;  Surgeon: Juanda Crumble  Antony Blackbird, MD;  Location: [facility];  Service: Vascular;  Laterality: N/A;  Ultrasound guided  . ABDOMINAL HYSTERECTOMY     partial not cancer  . BREAST SURGERY     lumpectomy  . CYSTOCELE REPAIR    . ESOPHAGOGASTRODUODENOSCOPY  09/1998   erosive esphagitis ? stricture treated 12/2002  . HERNIA REPAIR  41/2878   umbilical hernia  . RECTOCELE REPAIR    . REFRACTIVE SURGERY     for film after cataract surgery.  Marland Kitchen RIGHT COLECTOMY  1997   benign  . SKIN LESION EXCISION  05/2011   basal cell skin lesion removed.     OB History   No  obstetric history on file.      Home Medications    Prior to Admission medications   Medication Sig Start Date End Date Taking? Authorizing Provider  acetaminophen (TYLENOL) 500 MG tablet Take 500 mg by mouth every 4 (four) hours as needed for mild pain.    Yes [provider]  albuterol (PROAIR HFA) 108 (90 Base) MCG/ACT inhaler Inhale 2 puffs into the lungs every 4 (four) hours as needed for wheezing. 09/10/17  Yes Kozlow, Donnamarie Poag, MD  beclomethasone (QVAR REDIHALER) 80 MCG/ACT inhaler Inhale 2 puffs into the lungs 2 (two) times daily. 12/22/18  Yes Kozlow, Donnamarie Poag, MD  cetirizine (ZYRTEC) 10 MG tablet TAKE 1 TABLET BY MOUTH DAILY AS NEEDED FOR ALLERGIES Patient taking differently: Take 10 mg by mouth daily.  06/10/18  Yes Kozlow, Donnamarie Poag, MD  clopidogrel (PLAVIX) 75 MG tablet Take 1 tablet (75 mg total) by mouth daily. 07/14/18  Yes Tower, Wynelle Fanny, MD  dimenhyDRINATE (DRAMAMINE PO) Take 1 tablet by mouth as needed (dizziness).   Yes [provider]  docusate sodium (COLACE) 100 MG capsule Take 100 mg by mouth every evening.   Yes [provider]  fluticasone (FLONASE) 50 MCG/ACT nasal spray Place 2 sprays into both nostrils daily. 07/14/18  Yes Tower, Marne A, MD  LUMIGAN 0.01 % SOLN Place 1 drop into both eyes at bedtime. 12/18/17  Yes [provider]  NEXIUM 40 MG capsule TAKE ONE (1) CAPSULE BY MOUTH 2 TIMES DAILY BEFORE A MEAL Patient taking differently: Take 40 mg by mouth See admin instructions. TAKE ONE (1) CAPSULE every morning. Takes a second dose later in the day as needed for heartburn. 07/14/18  Yes Tower, Wynelle Fanny, MD  ondansetron (ZOFRAN ODT) 4 MG disintegrating tablet Take 1 tablet (4 mg total) by mouth every 8 (eight) hours as needed for nausea or vomiting. 09/15/18  Yes Tower, Wynelle Fanny, MD  Polyethyl Glycol-Propyl Glycol (SYSTANE OP) Place 1 drop into both eyes every 8 (eight) hours as needed (dry eyes).    Yes [provider]  polyethylene  glycol (MIRALAX / GLYCOLAX) packet Take 17 g by mouth daily as needed for mild constipation.    Yes [provider]  Probiotic Product (ALIGN) 4 MG CAPS Take 4 mg by mouth daily.   Yes [provider]  traMADol (ULTRAM) 50 MG tablet Take 1/2 pill by mouth each am and 1 tab QHS Patient taking differently: Take 25-50 mg by mouth See admin instructions. Take 1/2 pill by mouth each am and 1 tab QHS 10/13/18  Yes Tower, Wynelle Fanny, MD  trimethoprim (TRIMPEX) 100 MG tablet Take 1 tablet (100 mg total) by mouth 2 (two) times daily. Patient taking differently: Take 100 mg by mouth daily with lunch.  03/07/17  Yes Tower, [employer],  MD  benzonatate (TESSALON) 100 MG capsule Take 1-2 capsules (100-200 mg total) by mouth 3 (three) times daily as needed. Patient not taking: Reported on 12/23/2018 11/17/18   Elby Beck, FNP  fluticasone (FLOVENT HFA) 44 MCG/ACT inhaler Inhale 2 puffs into the lungs daily. Patient not taking: Reported on 12/23/2018 12/22/18   Jiles Prows, MD  Fluticasone Furoate (ARNUITY ELLIPTA) 200 MCG/ACT AEPB Inhale 1 puff into the lungs daily. Patient not taking: Reported on 12/23/2018 12/22/18   Jiles Prows, MD    Family History Family History  Problem Relation Age of Onset  . Heart disease Mother        deceased age 84 heart attack.  Marland Kitchen Heart attack Mother   . Other Mother        varicose veins  . Varicose Veins Mother   . Heart disease Father        age 28 heart attack  . Cancer Father   . Stroke Brother   . Cancer Brother   . Heart disease Brother   . Heart attack Brother   . Cancer Daughter   . Diabetes Son     Social History Social History   Tobacco Use  . Smoking status: Never Smoker  . Smokeless tobacco: Never Used  Substance Use Topics  . Alcohol use: No    Alcohol/week: 0.0 standard drinks  . Drug use: No     Allergies   Amoxicillin-pot clavulanate; Avelox [moxifloxacin]; Flovent hfa [fluticasone]; Ibuprofen; Keflex [cephalexin];  Nitrofurantoin; Omeprazole; Prevnar [pneumococcal 13-val conj vacc]; Rosuvastatin; Sertraline hcl; and Tetanus toxoids   Review of Systems Review of Systems  Constitutional: Negative for fever.  Respiratory: Negative for shortness of breath.   Cardiovascular: Negative for chest pain.  Gastrointestinal: Positive for nausea and vomiting.  Musculoskeletal: Positive for gait problem and neck pain.  Neurological: Positive for dizziness. Negative for weakness and headaches.  All other systems reviewed and are negative.    Physical Exam Updated Vital Signs BP (!) 150/68   Pulse 82   Temp 98.4 F (36.9 C) (Oral)   Resp 16   LMP 12/17/1968   SpO2 94%   Physical Exam Vitals signs and nursing note reviewed.  Constitutional:      General: She is not in acute distress.    Appearance: She is well-developed.  HENT:     Head: Normocephalic and atraumatic.     Right Ear: External ear normal.     Left Ear: External ear normal.     Nose: Nose normal.  Eyes:     General:        Right eye: No discharge.        Left eye: No discharge.     Extraocular Movements: Extraocular movements intact.     Pupils: Pupils are equal, round, and reactive to light.  Cardiovascular:     Rate and Rhythm: Normal rate and regular rhythm.     Heart sounds: Normal heart sounds.  Pulmonary:     Effort: Pulmonary effort is normal.     Breath sounds: Normal breath sounds.  Abdominal:     Palpations: Abdomen is soft.     Tenderness: There is no abdominal tenderness.  Skin:    General: Skin is warm and dry.  Neurological:     Mental Status: She is alert.     Comments: CN 3-12 grossly intact. 5/5 strength in all 4 extremities. Grossly normal sensation. Normal finger to nose.   Psychiatric:  Mood and Affect: Mood is not anxious.      ED Treatments / Results  Labs (all labs ordered are listed, but only abnormal results are displayed) Labs Reviewed  COMPREHENSIVE METABOLIC PANEL - Abnormal; Notable  for the following components:      Result Value   Glucose, Bld 114 (*)    GFR calc non Af Amer 54 (*)    All other components within normal limits  URINALYSIS, ROUTINE W REFLEX MICROSCOPIC - Abnormal; Notable for the following components:   Color, Urine STRAW (*)    Hgb urine dipstick MODERATE (*)    Bacteria, UA RARE (*)    All other components within normal limits  I-STAT CHEM 8, ED - Abnormal; Notable for the following components:   Glucose, Bld 112 (*)    All other components within normal limits  PROTIME-INR  APTT  CBC  DIFFERENTIAL  RAPID URINE DRUG SCREEN, HOSP PERFORMED  ETHANOL  I-STAT TROPONIN, ED    EKG EKG Interpretation  Date/Time:  Tuesday December 23 2018 09:32:10 EST Ventricular Rate:  84 PR Interval:    QRS Duration: 85 QT Interval:  403 QTC Calculation: 477 R Axis:   -25 Text Interpretation:  Sinus or ectopic atrial rhythm Prolonged PR interval Borderline left axis deviation Consider anterior infarct no significant change since Sept 2018 Confirmed by Sherwood Gambler [phone]) on 12/23/2018 11:06:09 AM   Radiology Ct Angio Head W Or Wo Contrast  Result Date: 12/23/2018 CLINICAL DATA:  Focal neuro deficit greater than 6 hours. Suspect stroke. Aphasia, syncope EXAM: CT ANGIOGRAPHY HEAD AND NECK TECHNIQUE: Multidetector CT imaging of the head and neck was performed using the standard protocol during bolus administration of intravenous contrast. Multiplanar CT image reconstructions and MIPs were obtained to evaluate the vascular anatomy. Carotid stenosis measurements (when applicable) are obtained utilizing NASCET criteria, using the distal internal carotid diameter as the denominator. CONTRAST:  176mL ISOVUE-370 IOPAMIDOL (ISOVUE-370) INJECTION 76% COMPARISON:  CT head 12/23/2018 FINDINGS: CTA NECK FINDINGS Aortic arch: Atherosclerotic aortic arch. Atherosclerotic disease and mild stenosis in the subclavian artery bilaterally. Right carotid system: Mild atherosclerotic  calcification right carotid bifurcation without significant stenosis. Left carotid system: Mild stenosis distal left common carotid artery. Mild atherosclerotic disease left internal carotid artery without stenosis. Vertebral arteries: Both vertebral arteries widely patent in the neck. Skeleton: No acute skeletal abnormality. Other neck: Negative for mass or adenopathy Upper chest: 2 cm sub solid density right upper lobe. Follow-up recommended. Probable scarring in the right upper lobe anteriorly and in the left upper lobe. Review of the MIP images confirms the above findings CTA HEAD FINDINGS Anterior circulation: Atherosclerotic calcification and mild stenosis in the cavernous carotid bilaterally. Anterior and middle cerebral arteries patent bilaterally without significant stenosis or occlusion Posterior circulation: Mild stenosis distal vertebral artery bilaterally. Basilar widely patent. Right PICA patent. Left PICA not visualized. Superior cerebellar and posterior cerebral arteries patent bilaterally without stenosis. Venous sinuses: Patent Anatomic variants: None Delayed phase: Normal enhancement on delayed imaging. Review of the MIP images confirms the above findings IMPRESSION: 1. Negative for emergent large vessel occlusion 2. Mild stenosis distal left common carotid artery. Carotid bifurcation mildly disease bilaterally without significant stenosis. Mild atherosclerotic stenosis in the cavernous carotid bilaterally. 3. Mild stenosis distal vertebral artery bilaterally. 4. 2 cm sub solid density right upper lobe. Recommend CT chest at this time for baseline evaluation. This could be due to scarring or neoplasm. Electronically Signed   By: Franchot Gallo M.D.   On:  12/23/2018 13:25   Dg Chest 2 View  Result Date: 12/23/2018 CLINICAL DATA:  Syncope. EXAM: CHEST - 2 VIEW COMPARISON:  Chest x-ray dated January 01, 2018. FINDINGS: Stable mild cardiomegaly. Normal pulmonary vascularity. Atherosclerotic  calcification of the aortic arch. No focal consolidation, pleural effusion, or pneumothorax. No acute osseous abnormality. Chronic L1 compression deformity. IMPRESSION: No active cardiopulmonary disease. Electronically Signed   By: Titus Dubin M.D.   On: 12/23/2018 13:31   Ct Head Wo Contrast  Result Date: 12/23/2018 CLINICAL DATA:  Ataxia with stroke suspected.  Syncope. EXAM: CT HEAD WITHOUT CONTRAST TECHNIQUE: Contiguous axial images were obtained from the base of the skull through the vertex without intravenous contrast. COMPARISON:  02/20/2018 FINDINGS: Brain: No evidence of acute infarction, hemorrhage, hydrocephalus, extra-axial collection or mass lesion/mass effect. Small remote right cerebellar infarct. Mild for age cerebral volume loss. Stable pattern of chronic small vessel ischemia in the deep cerebral white matter. Vascular: Atherosclerotic calcification Skull: No acute finding Sinuses/Orbits: Bilateral cataract resection.  No acute finding. IMPRESSION: No acute finding or change from prior. Electronically Signed   By: Monte Fantasia M.D.   On: 12/23/2018 10:49   Ct Angio Neck W And/or Wo Contrast  Result Date: 12/23/2018 CLINICAL DATA:  Focal neuro deficit greater than 6 hours. Suspect stroke. Aphasia, syncope EXAM: CT ANGIOGRAPHY HEAD AND NECK TECHNIQUE: Multidetector CT imaging of the head and neck was performed using the standard protocol during bolus administration of intravenous contrast. Multiplanar CT image reconstructions and MIPs were obtained to evaluate the vascular anatomy. Carotid stenosis measurements (when applicable) are obtained utilizing NASCET criteria, using the distal internal carotid diameter as the denominator. CONTRAST:  141mL ISOVUE-370 IOPAMIDOL (ISOVUE-370) INJECTION 76% COMPARISON:  CT head 12/23/2018 FINDINGS: CTA NECK FINDINGS Aortic arch: Atherosclerotic aortic arch. Atherosclerotic disease and mild stenosis in the subclavian artery bilaterally. Right carotid  system: Mild atherosclerotic calcification right carotid bifurcation without significant stenosis. Left carotid system: Mild stenosis distal left common carotid artery. Mild atherosclerotic disease left internal carotid artery without stenosis. Vertebral arteries: Both vertebral arteries widely patent in the neck. Skeleton: No acute skeletal abnormality. Other neck: Negative for mass or adenopathy Upper chest: 2 cm sub solid density right upper lobe. Follow-up recommended. Probable scarring in the right upper lobe anteriorly and in the left upper lobe. Review of the MIP images confirms the above findings CTA HEAD FINDINGS Anterior circulation: Atherosclerotic calcification and mild stenosis in the cavernous carotid bilaterally. Anterior and middle cerebral arteries patent bilaterally without significant stenosis or occlusion Posterior circulation: Mild stenosis distal vertebral artery bilaterally. Basilar widely patent. Right PICA patent. Left PICA not visualized. Superior cerebellar and posterior cerebral arteries patent bilaterally without stenosis. Venous sinuses: Patent Anatomic variants: None Delayed phase: Normal enhancement on delayed imaging. Review of the MIP images confirms the above findings IMPRESSION: 1. Negative for emergent large vessel occlusion 2. Mild stenosis distal left common carotid artery. Carotid bifurcation mildly disease bilaterally without significant stenosis. Mild atherosclerotic stenosis in the cavernous carotid bilaterally. 3. Mild stenosis distal vertebral artery bilaterally. 4. 2 cm sub solid density right upper lobe. Recommend CT chest at this time for baseline evaluation. This could be due to scarring or neoplasm. Electronically Signed   By: Franchot Gallo M.D.   On: 12/23/2018 13:25    Procedures Procedures (including critical care time)  Medications Ordered in ED Medications  sodium chloride 0.9 % bolus 500 mL (0 mLs Intravenous Stopped 12/23/18 1103)  diazepam (VALIUM)  tablet 2 mg (2 mg Oral  Given 12/23/18 1213)  iopamidol (ISOVUE-370) 76 % injection 100 mL (100 mLs Intravenous Contrast Given 12/23/18 1247)  fentaNYL (SUBLIMAZE) injection 50 mcg (50 mcg Intravenous Given 12/23/18 1343)     Initial Impression / Assessment and Plan / ED Course  I have reviewed the triage vital signs and the nursing notes.  Pertinent labs & imaging results that were available during my care of the patient were reviewed by me and considered in my medical decision making (see chart for details).     The patient does not have any focal neuro deficits.  Technically she was last known normal yesterday or before.  With the dizziness, this could be neurologic versus syncope of other cause.  Her symptoms sound more neuro with the dizziness and vomiting and difficulty walking but at the same time she also passed out briefly.  Given the neck/trapezius discomfort, CT angiography was obtained and shows no obvious dissection or other acute findings.  No head bleed.  No chest pain, shortness of breath, hypoxia so I think PE is probably unlikely.  At this point, she is still having difficulty getting up.  While she does have chronic low back pain, there is no abdominal pain or new back pain is I think abdominal emergency is pretty unlikely.  And she will need observation admission for further treatment and physical therapy/work-up.  Final Clinical Impressions(s) / ED Diagnoses   Final diagnoses:  Syncope and collapse    ED Discharge Orders    None       Sherwood Gambler, MD 12/23/18 1529

## 2018-12-23 NOTE — ED Notes (Signed)
Pt's family requesting more pain meds for pt.

## 2018-12-23 NOTE — ED Notes (Signed)
Daughter reports she advised admitting team that she would give pt medication from home.

## 2018-12-23 NOTE — ED Notes (Signed)
Patient transported to CT 

## 2018-12-23 NOTE — Telephone Encounter (Signed)
PA submitted through cover my meds 

## 2018-12-24 ENCOUNTER — Inpatient Hospital Stay (HOSPITAL_COMMUNITY): Payer: Medicare Other

## 2018-12-24 DIAGNOSIS — R55 Syncope and collapse: Secondary | ICD-10-CM

## 2018-12-24 DIAGNOSIS — I951 Orthostatic hypotension: Principal | ICD-10-CM

## 2018-12-24 DIAGNOSIS — I160 Hypertensive urgency: Secondary | ICD-10-CM | POA: Diagnosis present

## 2018-12-24 DIAGNOSIS — G9341 Metabolic encephalopathy: Secondary | ICD-10-CM

## 2018-12-24 LAB — BASIC METABOLIC PANEL
Anion gap: 8 (ref 5–15)
BUN: 7 mg/dL — ABNORMAL LOW (ref 8–23)
CO2: 23 mmol/L (ref 22–32)
Calcium: 8.7 mg/dL — ABNORMAL LOW (ref 8.9–10.3)
Chloride: 108 mmol/L (ref 98–111)
Creatinine, Ser: 0.9 mg/dL (ref 0.44–1.00)
GFR calc Af Amer: >60 mL/min (ref >60)
GFR calc non Af Amer: 54 mL/min — ABNORMAL LOW (ref >60)
Glucose, Bld: 90 mg/dL (ref 70–99)
POTASSIUM: 3.5 mmol/L (ref 3.5–5.1)
Sodium: 139 mmol/L (ref 135–145)

## 2018-12-24 LAB — TROPONIN I: Troponin I: <0.03 ng/mL (ref <0.03)

## 2018-12-24 LAB — GLUCOSE, CAPILLARY: Glucose-Capillary: 88 mg/dL (ref 70–99)

## 2018-12-24 LAB — ECHOCARDIOGRAM COMPLETE

## 2018-12-24 MED ORDER — POLYETHYLENE GLYCOL 3350 17 G PO PACK: 17.0000 g | PACK | Freq: Every day | ORAL | Status: DC | PRN

## 2018-12-24 MED ORDER — DOCUSATE SODIUM 100 MG PO CAPS
100.0000 mg | ORAL_CAPSULE | Freq: Every evening | ORAL | Status: DC
  Administered 2018-12-24 – 2018-12-25 (×2): 100 mg via ORAL
  Filled 2018-12-24 (×2): qty 1

## 2018-12-24 MED ORDER — ALBUTEROL SULFATE (2.5 MG/3ML) 0.083% IN NEBU: 2.5000 mg | INHALATION_SOLUTION | RESPIRATORY_TRACT | Status: DC | PRN

## 2018-12-24 MED ORDER — AMLODIPINE BESYLATE 2.5 MG PO TABS
2.5000 mg | ORAL_TABLET | Freq: Every day | ORAL | Status: DC
  Administered 2018-12-25: 2.5 mg via ORAL
  Filled 2018-12-24 (×2): qty 1

## 2018-12-24 MED ORDER — ACETAMINOPHEN 325 MG PO TABS
650.0000 mg | ORAL_TABLET | Freq: Four times a day (QID) | ORAL | Status: DC | PRN
  Administered 2018-12-25 – 2018-12-26 (×2): 650 mg via ORAL
  Filled 2018-12-24 (×2): qty 2

## 2018-12-24 NOTE — Consult Note (Addendum)
Referring Physician: In house Code Stroke, Rapid Response RN    Reason for Consult: In House Code Stoke  HPI: Ashlee Reyes is an 85 y.o. female who was admitted on 1/7 for syncope and two days of dizziness and uncontrolled HTN. Work up this far has been MRI brain neg, CTA h/n shows some min bilat carotid stenosis, Bilat distal vert stenosis. Echo pending. Tele without arrythmia. Today the pts dtr says she became confused and had slurred speech after awakening from a nap. There is no focal deficits, no aphasia, just confused to date/place/age.She does have confusion at times, but family states she is not usually "this" confused. She c/o head and neck ache as well as her typical abd pain which is not new for her. No further dysarthria evident on my exam at bedside. Stat [place] show no acute changes. Blood sugar checked= 88.  Date last known well: today Time last known well: ~1200  tPA Given: not given d/t diagnostic uncertainty.   Past Medical History Past Medical History:  Diagnosis Date  . AAA (abdominal aortic aneurysm) ([place])    followed by Dr Oneida Alar. Last u/s07/10 stable AAA with largest measurement 3.97 x 4.02cm.  . Allergic rhinitis, seasonal   . Asthma   . Atrial fibrillation ([place])   . CAD (coronary artery disease) 07/2009   mod by cath 08/10  . Complication of anesthesia   . Depression   . Disc disease, degenerative, lumbar or lumbosacral    SPINAL STENOSIS  . Diverticulosis   . GERD (gastroesophageal reflux disease)   . Hemorrhoids   . History of recurrent UTIs   . Hyperlipidemia   . IBS (irritable bowel syndrome)   . IC (irritable colon)    DMSO in past  . Interstitial cystitis   . Osteoarthritis    spine/spinal stenosis  . Osteopenia 2011  . PONV (postoperative nausea and vomiting)   . Skin cancer    basal cell cancer - face.    Surgical History Past Surgical History:  Procedure Laterality Date  . ABDOMINAL AORTIC ENDOVASCULAR STENT GRAFT N/A 07/01/2013    Procedure: ABDOMINAL AORTIC ENDOVASCULAR STENT GRAFT- GORE;  Surgeon: Elam Dutch, MD;  Location: [facility];  Service: Vascular;  Laterality: N/A;  Ultrasound guided  . ABDOMINAL HYSTERECTOMY     partial not cancer  . BREAST SURGERY     lumpectomy  . CYSTOCELE REPAIR    . ESOPHAGOGASTRODUODENOSCOPY  09/1998   erosive esphagitis ? stricture treated 12/2002  . HERNIA REPAIR  [id number]/[id number]   umbilical hernia  . RECTOCELE REPAIR    . REFRACTIVE SURGERY     for film after cataract surgery.  Marland Kitchen RIGHT COLECTOMY  1997   benign  . SKIN LESION EXCISION  05/2011   basal cell skin lesion removed.    Family History  Family History  Problem Relation Age of Onset  . Heart disease Mother        deceased age 73 heart attack.  Marland Kitchen Heart attack Mother   . Other Mother        varicose veins  . Varicose Veins Mother   . Heart disease Father        age 52 heart attack  . Cancer Father   . Stroke Brother   . Cancer Brother   . Heart disease Brother   . Heart attack Brother   . Cancer Daughter   . Diabetes Son     Social History:   reports that she has never  smoked. She has never used smokeless tobacco. She reports that she does not drink alcohol or use drugs.  Allergies:  Allergies  Allergen Reactions  . Amoxicillin-Pot Clavulanate Nausea And Vomiting    Has patient had a PCN reaction causing immediate rash, facial/tongue/throat swelling, SOB or lightheadedness with hypotension: No Has patient had a PCN reaction causing severe rash involving mucus membranes or skin necrosis: No Has patient had a PCN reaction that required hospitalization: No Has patient had a PCN reaction occurring within the last 10 years: No If all of the above answers are "NO", then may proceed with Cephalosporin use.   . Avelox [Moxifloxacin] Nausea And Vomiting  . Flovent Hfa [Fluticasone] Other (See Comments)    "makes her feel funny" per family member  . Ibuprofen Other (See Comments)    GI upset  . Keflex  [Cephalexin] Other (See Comments)    abd pain / GI upset  . Nitrofurantoin Other (See Comments)    Chest pain or indigestion with nausea  . Omeprazole Nausea Only  . Prevnar [Pneumococcal 13-Val Conj Vacc] Itching    Severe local reaction with redness and itching   . Rosuvastatin Other (See Comments)    Foot pain  . Sertraline Hcl Other (See Comments)    Made her more depressed  . Tetanus Toxoids     Local redness and swelling     Home Medications:  Medications Prior to Admission  Medication Sig Dispense Refill  . acetaminophen (TYLENOL) 500 MG tablet Take 500 mg by mouth every 4 (four) hours as needed for mild pain.     Marland Kitchen albuterol (PROAIR HFA) 108 (90 Base) MCG/ACT inhaler Inhale 2 puffs into the lungs every 4 (four) hours as needed for wheezing. 1 Inhaler 2  . beclomethasone (QVAR REDIHALER) 80 MCG/ACT inhaler Inhale 2 puffs into the lungs 2 (two) times daily. 1 Inhaler 0  . cetirizine (ZYRTEC) 10 MG tablet TAKE 1 TABLET BY MOUTH DAILY AS NEEDED FOR ALLERGIES (Patient taking differently: Take 10 mg by mouth daily. ) 30 tablet 5  . clopidogrel (PLAVIX) 75 MG tablet Take 1 tablet (75 mg total) by mouth daily. 30 tablet 11  . dimenhyDRINATE (DRAMAMINE PO) Take 1 tablet by mouth as needed (dizziness).    Marland Kitchen docusate sodium (COLACE) 100 MG capsule Take 100 mg by mouth every evening.    . fluticasone (FLONASE) 50 MCG/ACT nasal spray Place 2 sprays into both nostrils daily. 16 g 11  . LUMIGAN 0.01 % SOLN Place 1 drop into both eyes at bedtime.    Marland Kitchen NEXIUM 40 MG capsule TAKE ONE (1) CAPSULE BY MOUTH 2 TIMES DAILY BEFORE A MEAL (Patient taking differently: Take 40 mg by mouth See admin instructions. TAKE ONE (1) CAPSULE every morning. Takes a second dose later in the day as needed for heartburn.) 60 capsule 11  . ondansetron (ZOFRAN ODT) 4 MG disintegrating tablet Take 1 tablet (4 mg total) by mouth every 8 (eight) hours as needed for nausea or vomiting. 20 tablet 1  . Polyethyl Glycol-Propyl  Glycol (SYSTANE OP) Place 1 drop into both eyes every 8 (eight) hours as needed (dry eyes).     . polyethylene glycol (MIRALAX / GLYCOLAX) packet Take 17 g by mouth daily as needed for mild constipation.     . Probiotic Product (ALIGN) 4 MG CAPS Take 4 mg by mouth daily.    . traMADol (ULTRAM) 50 MG tablet Take 1/2 pill by mouth each am and 1 tab QHS (Patient taking  differently: Take 25-50 mg by mouth See admin instructions. Take 1/2 pill by mouth each am and 1 tab QHS) 45 tablet 3  . trimethoprim (TRIMPEX) 100 MG tablet Take 1 tablet (100 mg total) by mouth 2 (two) times daily. (Patient taking differently: Take 100 mg by mouth daily with lunch. ) 60 tablet 5  . benzonatate (TESSALON) 100 MG capsule Take 1-2 capsules (100-200 mg total) by mouth 3 (three) times daily as needed. (Patient not taking: Reported on 12/23/2018) 30 capsule 0  . fluticasone (FLOVENT HFA) 44 MCG/ACT inhaler Inhale 2 puffs into the lungs daily. (Patient not taking: Reported on 12/23/2018) 1 Inhaler 0  . Fluticasone Furoate (ARNUITY ELLIPTA) 200 MCG/ACT AEPB Inhale 1 puff into the lungs daily. (Patient not taking: Reported on 12/23/2018) 30 each 5    Hospital Medications . acidophilus  1 capsule Oral Daily  . clopidogrel  75 mg Oral Daily  . fluticasone  2 spray Each Nare Daily  . heparin  5,000 Units Subcutaneous Q8H  . latanoprost  1 drop Both Eyes QHS  . loratadine  10 mg Oral Daily  . pantoprazole  40 mg Oral Daily  . sodium chloride flush  3 mL Intravenous Q12H    ROS:  History obtained from pt/dtr at bedside since pt is poor historian  General ROS: negative for - chills, fatigue, fever, night sweats, weight gain or weight loss Psychological ROS: negative for - behavioral disorder, hallucinations, memory difficulties, mood swings or suicidal ideation Ophthalmic ROS: negative for - blurry vision, double vision, eye pain or loss of vision ENT ROS: negative for - epistaxis, nasal discharge, oral lesions, sore throat,  tinnitus or vertigo Allergy and Immunology ROS: negative for - hives or itchy/watery eyes Hematological and Lymphatic ROS: negative for - bleeding problems, bruising or swollen lymph nodes Endocrine ROS: negative for - galactorrhea, hair pattern changes, polydipsia/polyuria or temperature intolerance Respiratory ROS: negative for - cough, hemoptysis, shortness of breath or wheezing Cardiovascular ROS: negative for - chest pain, dyspnea on exertion, edema or irregular heartbeat Gastrointestinal ROS: negative for - abdominal pain, diarrhea, hematemesis, nausea/vomiting or stool incontinence Genito-Urinary ROS: negative for - dysuria, hematuria, incontinence or urinary frequency/urgency Musculoskeletal ROS: negative for - joint swelling or muscular weakness Neurological ROS: as noted in HPI Dermatological ROS: negative for rash and skin lesion changes   Physical Examination:  Vitals:   12/23/18 2013 12/24/18 0010 12/24/18 0417 12/24/18 1514  BP: (!) 158/86 131/64 (!) 152/72 (!) 146/63  Pulse: 77 75 72 78  Resp:  18 18   Temp: 98.9 F (37.2 C) 98.6 F (37 C) 98.5 F (36.9 C)   TempSrc: Oral Oral Oral   SpO2: 95% 94% 92% 100%    General - mild distress. Appears well nourished and younger than stated age. Heart - Regular rate and rhythm - no murmer appreciated Lungs - Clear to auscultation  Abdomen - Soft - non tender Extremities - Distal pulses intact - no edema Skin - Warm and dry   Neurologic Examination:  Mental Status: Alert, oriented to name, place and gives her month/day, but not year or age. Unable to give month/yr.  Speech fluent without evidence of aphasia. No anomia. Clear and without dysarthria on my exam.  Able to follow 3 step commands without difficulty. Cranial Nerves: II: Discs not visualized; Visual fields grossly normal, pupils equal, round, reactive to light III,IV, VI: ptosis not present, extra-ocular motions intact bilaterally V,VII: smile symmetric, facial  light touch sensation normal bilaterally VIII: hearing  normal bilaterally IX,X: gag reflex present XI: bilateral shoulder shrug XII: midline tongue extension Motor: RUE - 5/5    LUE - 5/5   RLE - 5/5    LLE - 5/5 Tone and bulk:normal tone throughout; no atrophy noted Sensory: Light touch intact throughout, bilaterally Deep Tendon Reflexes: 2+ and symmetric throughout Plantars: Right: downgoing   Left: downgoing Cerebellar: normal finger-to-nose and normal heel-to-shin test Gait: deferred at this time.   NIHSS 1a Level of Conscious:0 1b LOC Questions: 2 1c LOC Commands: 0 2 Best Gaze: 0 3 Visual: 0 4 Facial Palsy: 0 5a Motor Arm - left: 0 5b Motor Arm - Right: 0 6a Motor Leg - Left: 0 6b Motor Leg - Right: 0 7 Limb Ataxia: 0 8 Sensory: 0 9 Best Language: 0 10 Dysarthria:0 11 Extinct. and Inattention:0 TOTAL: 2   LABORATORY STUDIES:  Basic Metabolic Panel: Recent Labs  Lab 12/23/18 0957 12/23/18 1007 12/24/18 0549  NA 138 140 139  K 4.0 4.1 3.5  CL 104 103 108  CO2 26  --  23  GLUCOSE 114* 112* 90  BUN 8 9 7*  CREATININE 0.90 0.90 0.90  CALCIUM 9.2  --  8.7*    Liver Function Tests: Recent Labs  Lab 12/23/18 0957  AST 18  ALT 13  ALKPHOS 54  BILITOT 0.6  PROT 6.8  ALBUMIN 3.6   No results for input(s): LIPASE, AMYLASE in the last 168 hours. No results for input(s): AMMONIA in the last 168 hours.  CBC: Recent Labs  Lab 12/23/18 0957 12/23/18 1007  WBC 7.7  --   NEUTROABS 6.2  --   HGB 13.5 14.6  HCT 44.0 43.0  MCV 90.7  --   PLT 298  --     Cardiac Enzymes: Recent Labs  Lab 12/23/18 2152 12/24/18 0549  TROPONINI <0.03 <0.03    BNP: Invalid input(s): POCBNP  CBG: No results for input(s): GLUCAP in the last 168 hours.  Microbiology:   Coagulation Studies: Recent Labs    12/23/18 0957  LABPROT 12.8  INR 0.97    Urinalysis:  Recent Labs  Lab 12/23/18 0943  COLORURINE STRAW*  LABSPEC 1.008  PHURINE 8.0  GLUCOSEU  NEGATIVE  HGBUR MODERATE*  BILIRUBINUR NEGATIVE  KETONESUR NEGATIVE  PROTEINUR NEGATIVE  NITRITE NEGATIVE  LEUKOCYTESUR NEGATIVE    Lipid Panel:     Component Value Date/Time   CHOL 240 (H) 07/08/2018 0844   TRIG 110.0 07/08/2018 0844   HDL 54.30 07/08/2018 0844   CHOLHDL 4 07/08/2018 0844   VLDL 22.0 07/08/2018 0844   LDLCALC 164 (H) 07/08/2018 0844    HgbA1C:  No results found for: HGBA1C  Urine Drug Screen:      Component Value Date/Time   LABOPIA NONE DETECTED 12/23/2018 1102   COCAINSCRNUR NONE DETECTED 12/23/2018 1102   LABBENZ NONE DETECTED 12/23/2018 1102   AMPHETMU NONE DETECTED 12/23/2018 1102   THCU NONE DETECTED 12/23/2018 1102   LABBARB NONE DETECTED 12/23/2018 1102     Alcohol Level:  Recent Labs  Lab 12/23/18 0957  ETH <10    Miscellaneous labs:  EKG  EKG   IMAGING: Ct Angio Head W Or Wo Contrast  Result Date: 12/23/2018 CLINICAL DATA:  Focal neuro deficit greater than 6 hours. Suspect stroke. Aphasia, syncope EXAM: CT ANGIOGRAPHY HEAD AND NECK TECHNIQUE: Multidetector CT imaging of the head and neck was performed using the standard protocol during bolus administration of intravenous contrast. Multiplanar CT image reconstructions and MIPs were obtained  to evaluate the vascular anatomy. Carotid stenosis measurements (when applicable) are obtained utilizing NASCET criteria, using the distal internal carotid diameter as the denominator. CONTRAST:  142mL ISOVUE-370 IOPAMIDOL (ISOVUE-370) INJECTION 76% COMPARISON:  CT head 12/23/2018 FINDINGS: CTA NECK FINDINGS Aortic arch: Atherosclerotic aortic arch. Atherosclerotic disease and mild stenosis in the subclavian artery bilaterally. Right carotid system: Mild atherosclerotic calcification right carotid bifurcation without significant stenosis. Left carotid system: Mild stenosis distal left common carotid artery. Mild atherosclerotic disease left internal carotid artery without stenosis. Vertebral arteries:  Both vertebral arteries widely patent in the neck. Skeleton: No acute skeletal abnormality. Other neck: Negative for mass or adenopathy Upper chest: 2 cm sub solid density right upper lobe. Follow-up recommended. Probable scarring in the right upper lobe anteriorly and in the left upper lobe. Review of the MIP images confirms the above findings CTA HEAD FINDINGS Anterior circulation: Atherosclerotic calcification and mild stenosis in the cavernous carotid bilaterally. Anterior and middle cerebral arteries patent bilaterally without significant stenosis or occlusion Posterior circulation: Mild stenosis distal vertebral artery bilaterally. Basilar widely patent. Right PICA patent. Left PICA not visualized. Superior cerebellar and posterior cerebral arteries patent bilaterally without stenosis. Venous sinuses: Patent Anatomic variants: None Delayed phase: Normal enhancement on delayed imaging. Review of the MIP images confirms the above findings IMPRESSION: 1. Negative for emergent large vessel occlusion 2. Mild stenosis distal left common carotid artery. Carotid bifurcation mildly disease bilaterally without significant stenosis. Mild atherosclerotic stenosis in the cavernous carotid bilaterally. 3. Mild stenosis distal vertebral artery bilaterally. 4. 2 cm sub solid density right upper lobe. Recommend CT chest at this time for baseline evaluation. This could be due to scarring or neoplasm. Electronically Signed   By: Franchot Gallo M.D.   On: 12/23/2018 13:25   Dg Chest 2 View  Result Date: 12/23/2018 CLINICAL DATA:  Syncope. EXAM: CHEST - 2 VIEW COMPARISON:  Chest x-ray dated January 01, 2018. FINDINGS: Stable mild cardiomegaly. Normal pulmonary vascularity. Atherosclerotic calcification of the aortic arch. No focal consolidation, pleural effusion, or pneumothorax. No acute osseous abnormality. Chronic L1 compression deformity. IMPRESSION: No active cardiopulmonary disease. Electronically Signed   By: Titus Dubin M.D.   On: 12/23/2018 13:31   Ct Head Wo Contrast  Result Date: 12/23/2018 CLINICAL DATA:  Ataxia with stroke suspected.  Syncope. EXAM: CT HEAD WITHOUT CONTRAST TECHNIQUE: Contiguous axial images were obtained from the base of the skull through the vertex without intravenous contrast. COMPARISON:  02/20/2018 FINDINGS: Brain: No evidence of acute infarction, hemorrhage, hydrocephalus, extra-axial collection or mass lesion/mass effect. Small remote right cerebellar infarct. Mild for age cerebral volume loss. Stable pattern of chronic small vessel ischemia in the deep cerebral white matter. Vascular: Atherosclerotic calcification Skull: No acute finding Sinuses/Orbits: Bilateral cataract resection.  No acute finding. IMPRESSION: No acute finding or change from prior. Electronically Signed   By: Monte Fantasia M.D.   On: 12/23/2018 10:49   Ct Angio Neck W And/or Wo Contrast  Result Date: 12/23/2018 CLINICAL DATA:  Focal neuro deficit greater than 6 hours. Suspect stroke. Aphasia, syncope EXAM: CT ANGIOGRAPHY HEAD AND NECK TECHNIQUE: Multidetector CT imaging of the head and neck was performed using the standard protocol during bolus administration of intravenous contrast. Multiplanar CT image reconstructions and MIPs were obtained to evaluate the vascular anatomy. Carotid stenosis measurements (when applicable) are obtained utilizing NASCET criteria, using the distal internal carotid diameter as the denominator. CONTRAST:  167mL ISOVUE-370 IOPAMIDOL (ISOVUE-370) INJECTION 76% COMPARISON:  CT head 12/23/2018 FINDINGS: CTA NECK FINDINGS  Aortic arch: Atherosclerotic aortic arch. Atherosclerotic disease and mild stenosis in the subclavian artery bilaterally. Right carotid system: Mild atherosclerotic calcification right carotid bifurcation without significant stenosis. Left carotid system: Mild stenosis distal left common carotid artery. Mild atherosclerotic disease left internal carotid artery without  stenosis. Vertebral arteries: Both vertebral arteries widely patent in the neck. Skeleton: No acute skeletal abnormality. Other neck: Negative for mass or adenopathy Upper chest: 2 cm sub solid density right upper lobe. Follow-up recommended. Probable scarring in the right upper lobe anteriorly and in the left upper lobe. Review of the MIP images confirms the above findings CTA HEAD FINDINGS Anterior circulation: Atherosclerotic calcification and mild stenosis in the cavernous carotid bilaterally. Anterior and middle cerebral arteries patent bilaterally without significant stenosis or occlusion Posterior circulation: Mild stenosis distal vertebral artery bilaterally. Basilar widely patent. Right PICA patent. Left PICA not visualized. Superior cerebellar and posterior cerebral arteries patent bilaterally without stenosis. Venous sinuses: Patent Anatomic variants: None Delayed phase: Normal enhancement on delayed imaging. Review of the MIP images confirms the above findings IMPRESSION: 1. Negative for emergent large vessel occlusion 2. Mild stenosis distal left common carotid artery. Carotid bifurcation mildly disease bilaterally without significant stenosis. Mild atherosclerotic stenosis in the cavernous carotid bilaterally. 3. Mild stenosis distal vertebral artery bilaterally. 4. 2 cm sub solid density right upper lobe. Recommend CT chest at this time for baseline evaluation. This could be due to scarring or neoplasm. Electronically Signed   By: Franchot Gallo M.D.   On: 12/23/2018 13:25   Mr Brain Wo Contrast  Result Date: 12/23/2018 CLINICAL DATA:  Recurrent syncope EXAM: MRI HEAD WITHOUT CONTRAST TECHNIQUE: Multiplanar, multiecho pulse sequences of the brain and surrounding structures were obtained without intravenous contrast. COMPARISON:  Head CT 12/23/2018 FINDINGS: The examination had to be discontinued prior to completion due to the patient's back pain and inability to remain in the scanner. Axial and  coronal DWI, axial T2, axial FLAIR and axial GRE sequences were obtained. BRAIN: There is no acute infarct, acute hemorrhage, hydrocephalus or extra-axial collection. The midline structures are normal. No midline shift or other mass effect. There are old bilateral cerebellar infarcts and old bilateral basal ganglia lacunar infarcts. Early confluent hyperintense T2-weighted signal of the periventricular and deep white matter, most commonly due to chronic ischemic microangiopathy. Generalized atrophy without lobar predilection. Susceptibility-sensitive sequences show no chronic microhemorrhage or superficial siderosis. VASCULAR: Major intracranial arterial and venous sinus flow voids are normal. SKULL AND UPPER CERVICAL SPINE: Calvarial bone marrow signal is normal. There is no skull base mass. Visualized upper cervical spine and soft tissues are normal. SINUSES/ORBITS: No fluid levels or advanced mucosal thickening. No mastoid or middle ear effusion. The orbits are normal. IMPRESSION: 1. Truncated examination. 2. No acute intracranial abnormality. 3. 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 Electronically Signed   By: Ulyses Jarred M.D.   On: 12/23/2018 19:51   Ct Head Code Stroke Wo Contrast  Result Date: 12/24/2018 CLINICAL DATA:  Code stroke.  Slurred speech. EXAM: CT HEAD WITHOUT CONTRAST TECHNIQUE: Contiguous axial images were obtained from the base of the skull through the vertex without intravenous contrast. COMPARISON:  MR brain 12/23/2018. CTA head neck 12/23/2018 FINDINGS: Brain: No evidence of acute infarction, hemorrhage, hydrocephalus, extra-axial collection or mass lesion/mass effect. Remote RIGHT cerebellar infarcts. Advanced atrophy. Hypoattenuation of white matter, likely small vessel disease. Vascular: Calcification of the cavernous internal carotid arteries and distal vertebral arteries consistent with cerebrovascular atherosclerotic disease. No signs of intracranial  large  vessel occlusion. Skull: Normal. Negative for fracture or focal lesion. Sinuses/Orbits: No acute finding. Other: None ASPECTS ([place] Stroke Program Early CT Score) - Ganglionic level infarction (caudate, lentiform nuclei, internal capsule, insula, M1-M3 cortex): 7 - Supraganglionic infarction (M4-M6 cortex): 3 Total score (0-10 with 10 being normal): 10 IMPRESSION: 1. Atrophy and small vessel disease. No acute intracranial findings. 2. ASPECTS is 10. 3. These results were communicated to Dr. Lorraine Lax at 3:54 pmon 1/8/2020by text page via the [facility] messaging system. * Electronically Signed   By: Staci Righter M.D.   On: 12/24/2018 15:55      Assessment: 85 y.o. female who was admitted for syncope work up on 1/7. Vascular work up already done in detail, which has been reviewed. Work up this far has been MRI brain neg, CTA h/n shows some min bilat carotid stenosis, Bilat distal vert stenosis. Echo pending. Tele without arrythmia. Sent for stat repeat scan, but no new changes.   # Confusion w/reported dysarthria- ddx: TIA, delirium, encephalopathy # Dizziness with syncope spell- vascular wk up completed by primary team and reviewed.  # HTN- well controlled now # Headache- non-specific, intermittent 5/10. Tx symptomatically # Abd pain- GI issues are chronic per family # Advanced care planning- DNR/DNI confirmed with family at bedside.   Plan: Close Neuro checks for new 2hrs Vascular wk up already completed, no further suggestions Fall precautions HTN control    Desiree Metzger-Cihelka, ARNP-C, ANVP-BC  Attending Neurologist's note to follow   NEUROHOSPITALIST ADDENDUM Performed a face to face diagnostic evaluation.   I have reviewed the contents of history and physical exam as documented by PA/ARNP/Resident and agree with above documentation.  I have discussed and formulated the above plan as documented. Edits to the note have been made as needed.  Patient was admitted for syncope  work-up.  She woke up this afternoon and was confused and slurring her words witnessed by her daughter.  This is unusual for her.  She was taken down immediately for stat CT head which was unremarkable. We will watch her closely to see if she worsens.  It appears she has already completed her stroke work-up with echocardiogram, Dopplers.  Continue on Plavix and statin.  Stroke team to follow and can make any further recommendations      MD [facility] [phone]   If 7pm to 7am, please call on call as listed on AMION.

## 2018-12-24 NOTE — Progress Notes (Signed)
  Echocardiogram 2D Echocardiogram has been performed.  Ashlee Reyes 12/24/2018, 10:14 AM

## 2018-12-24 NOTE — Evaluation (Signed)
Occupational Therapy Evaluation Patient Details Name: Ashlee Reyes MRN: [id number] DOB: [birth year] Today's Date: 12/24/2018    History of Present Illness 85 y.o. female, with past medical history significant for asthma, A. fib and CAD presenting with two days history of dizziness, vertigo, and uncontrolled blood pressure. MRI negative for acute abnormalities.    Clinical Impression   PTA, pt was living with her daughter and was performing BADLs and using RW from functional mobility. Pt currently requiring Min Guard A for UB ADLs, Min A for LB ADLs, and Min A for functional transfers. Pt presenting with decreased strength and balance as well as urinary incontinents; per daughter pt was continent PTA. Pt reporting dizziness throughout ADLs and mobility; no nystagmus noted. Pt would benefit from further acute OT to facilitate safe dc. Recommend dc to SNF for further OT to optimize safety, independence with ADLs, and return to PLOF.   Blood pressures: Supine - 162/69 Sitting EOB - 161/84 Standing - 147/116 Sitting in recliner - 147/85     Follow Up Recommendations  SNF;Supervision/Assistance - 24 hour    Equipment Recommendations  None recommended by OT    Recommendations for Other Services PT consult     Precautions / Restrictions Precautions Precautions: Fall      Mobility Bed Mobility Overal bed mobility: Needs Assistance Bed Mobility: Supine to Sit     Supine to sit: Min assist;HOB elevated     General bed mobility comments: Min A to elevate trunk and faciltiate reach towards bedrail  Transfers Overall transfer level: Needs assistance Equipment used: Rolling walker (2 wheeled) Transfers: Sit to/from [facility] Sit to Stand: Min assist Stand pivot transfers: Min assist       General transfer comment: Min A to initate power up into standing. Noted posterior lean and required Min A to correct balance. With cues, pt able to weight shift  forward in static standing    Balance Overall balance assessment: Needs assistance Sitting-balance support: No upper extremity supported;Feet supported Sitting balance-Leahy Scale: Good Sitting balance - Comments: Pt able to don LB clothing while seated   Standing balance support: During functional activity;Single extremity supported Standing balance-Leahy Scale: Poor Standing balance comment: Requiring UE support or physical A                           ADL either performed or assessed with clinical judgement   ADL Overall ADL's : Needs assistance/impaired Eating/Feeding: Set up;Supervision/ safety;Sitting Eating/Feeding Details (indicate cue type and reason): Set up to eat breakfast while seated in recliner Grooming: Wash/dry hands;Wash/dry face;Supervision/safety;Set up;Sitting   Upper Body Bathing: Min guard;Sitting   Lower Body Bathing: Minimal assistance;Sit to/from stand   Upper Body Dressing : Min guard;Sitting   Lower Body Dressing: Minimal assistance;Sit to/from stand Lower Body Dressing Details (indicate cue type and reason): Pt donning depends while seated at EOB with Min A to initiate power up into standing and then correct posterior lean in standing. Toilet Transfer: Minimal assistance;Stand-pivot;RW(simulated to recliner)           Functional mobility during ADLs: Minimal assistance;Rolling walker General ADL Comments: Pt presenting with decreased cognition, strength, and balance.      Vision Baseline Vision/History: Wears glasses Wears Glasses: At all times Patient Visual Report: No change from baseline       Perception     Praxis      Pertinent Vitals/Pain Pain Assessment: Faces Faces Pain Scale: No hurt Pain  Intervention(s): Monitored during session     Hand Dominance Right   Extremity/Trunk Assessment Upper Extremity Assessment Upper Extremity Assessment: Generalized weakness   Lower Extremity Assessment Lower Extremity  Assessment: Defer to PT evaluation   Cervical / Trunk Assessment Cervical / Trunk Assessment: Kyphotic   Communication Communication Communication: HOH   Cognition Arousal/Alertness: Awake/alert Behavior During Therapy: WFL for tasks assessed/performed Overall Cognitive Status: Impaired/Different from baseline Area of Impairment: Orientation;Following commands;Awareness;Problem solving;Memory                 Orientation Level: Disoriented to;Situation;Place;Time   Memory: Decreased short-term memory Following Commands: Follows one step commands with increased time   Awareness: Emergent Problem Solving: Slow processing;Requires verbal cues General Comments: Pt able oriented to self and able to say she is in the hospital. Daughter reports that pt's cognition is not baseline and she is more confused. When asking the year, pt states "oh my memory is just gone today." Pt requiring increased cues and time throughout session. Very pleasant   General Comments  Daughter present throughout session. Pt with urinary incontience during mobility. Pt's daughter reports she did not have incontience PTA    Exercises     Shoulder Instructions      Home Living Family/patient expects to be discharged to:: Private residence Living Arrangements: Children(Daughter) Available Help at Discharge: Family;Available PRN/intermittently Type of Home: House Home Access: Ramped entrance     Home Layout: One level     Bathroom Shower/Tub: Occupational psychologist: Handicapped height     Home Equipment: Environmental consultant - 2 wheels;Grab bars - tub/shower;Shower seat          Prior Functioning/Environment Level of Independence: Independent with assistive device(s)        Comments: Pt performs BADLs and uses a RW for function mobility        OT Problem List: Decreased strength;Decreased range of motion;Decreased activity tolerance;Impaired balance (sitting and/or standing);Decreased  knowledge of use of DME or AE;Decreased knowledge of precautions      OT Treatment/Interventions: Self-care/ADL training;Therapeutic exercise;Energy conservation;DME and/or AE instruction;Therapeutic activities;Patient/family education    OT Goals(Current goals can be found in the care plan section) Acute Rehab OT Goals Patient Stated Goal: "Go to rehab to get stronger" OT Goal Formulation: With patient/family Time For Goal Achievement: 01/07/19 Potential to Achieve Goals: Good  OT Frequency: Min 2X/week   Barriers to D/C:            Co-evaluation              AM-PAC OT "6 Clicks" Daily Activity     Outcome Measure Help from another person eating meals?: None Help from another person taking care of personal grooming?: A Little Help from another person toileting, which includes using toliet, bedpan, or urinal?: A Little Help from another person bathing (including washing, rinsing, drying)?: A Little Help from another person to put on and taking off regular upper body clothing?: A Little Help from another person to put on and taking off regular lower body clothing?: A Little 6 Click Score: 19   End of Session Equipment Utilized During Treatment: Gait belt;Rolling walker Nurse Communication: Mobility status(Urinary incontience)  Activity Tolerance: Patient tolerated treatment well Patient left: in chair;with call bell/phone within reach;with chair alarm set  OT Visit Diagnosis: Unsteadiness on feet (R26.81);Other abnormalities of gait and mobility (R26.89);Muscle weakness (generalized) (M62.81)                Time: 2947-6546 OT Time Calculation (  min): 30 min Charges:  OT General Charges $OT Visit: 1 Visit OT Evaluation $OT Eval Moderate Complexity: 1 Mod OT Treatments $Self Care/Home Management : 8-22 mins  Tashika Goodin MSOT, OTR/L Acute Rehab Pager: [phone] Office: [place] 12/24/2018, 9:32 AM

## 2018-12-24 NOTE — Progress Notes (Signed)
Patients daughter is administering patient's home medications. patients daughter said she told Dr in the ER she was going to administer home medications.  Jadier Rockers, Tivis Ringer, RN

## 2018-12-24 NOTE — Plan of Care (Signed)
  Problem: Education: Goal: Knowledge of General Education information will improve Description Including pain rating scale, medication(s)/side effects and non-pharmacologic comfort measures Outcome: Progressing   

## 2018-12-24 NOTE — Evaluation (Signed)
Physical Therapy Evaluation Patient Details Name: Maylin Freeburg MRN: [id number] DOB: 09-29-1921 Today's Date: 12/24/2018   History of Present Illness  85 y.o. female, with past medical history significant for asthma, A. fib and CAD presenting with two days history of dizziness, vertigo, and uncontrolled blood pressure.  Clinical Impression  Pt admitted with/for the problems stated above.  At this time, pt in not at baseline function or cognition (per daughter) and needs min assist for mobility and gait.Marland Kitchen  Pt currently limited functionally due to the problems listed. ( See problems list.)   Pt will benefit from PT to maximize function and safety in order to get ready for next venue listed below.     Follow Up Recommendations SNF    Equipment Recommendations  None recommended by PT;Other (comment)(TBA at next venue)    Recommendations for Other Services       Precautions / Restrictions Precautions Precautions: Fall      Mobility  Bed Mobility Overal bed mobility: Needs Assistance Bed Mobility: Sit to Supine       Sit to supine: Min guard   General bed mobility comments: pt lifted legs into bed and bridged to the center without assist  Transfers Overall transfer level: Needs assistance Equipment used: Rolling walker (2 wheeled) Transfers: Sit to/from [facility] Sit to Stand: Min assist Stand pivot transfers: Min assist       General transfer comment: Min A to initate power up into standing. Noted posterior lean and required Min A to correct balance. With cues, pt able to weight shift forward in static standing  Ambulation/Gait Ambulation/Gait assistance: Min assist Gait Distance (Feet): 20 Feet(x2 to/from the bathroom) Assistive device: Rolling walker (2 wheeled) Gait Pattern/deviations: Step-through pattern   Gait velocity interpretation: <1.31 ft/sec, indicative of household ambulator General Gait Details: unsteady, variably short steps  needing minimal stability.  cues for posture, assist to help maneuver the RW  Stairs            Wheelchair Mobility    Modified Rankin (Stroke Patients Only)       Balance Overall balance assessment: Needs assistance Sitting-balance support: No upper extremity supported;Feet supported Sitting balance-Leahy Scale: Good Sitting balance - Comments: Pt able to don LB clothing while seated   Standing balance support: During functional activity;Single extremity supported Standing balance-Leahy Scale: Poor Standing balance comment: Requiring UE support or physical A                             Pertinent Vitals/Pain Pain Assessment: Faces Faces Pain Scale: No hurt Pain Intervention(s): Monitored during session    Home Living Family/patient expects to be discharged to:: Private residence Living Arrangements: Children(Daughter) Available Help at Discharge: Family;Available PRN/intermittently Type of Home: House Home Access: Ramped entrance     Home Layout: One level Home Equipment: Walker - 2 wheels;Grab bars - tub/shower;Shower seat      Prior Function Level of Independence: Independent with assistive device(s)         Comments: Pt performs BADLs and uses a RW for function mobility     Hand Dominance   Dominant Hand: Right    Extremity/Trunk Assessment   Upper Extremity Assessment Upper Extremity Assessment: Generalized weakness    Lower Extremity Assessment Lower Extremity Assessment: Generalized weakness(no overt weakness, but generally weak proximally)    Cervical / Trunk Assessment Cervical / Trunk Assessment: Kyphotic  Communication   Communication: HOH  Cognition Arousal/Alertness:  Awake/alert Behavior During Therapy: WFL for tasks assessed/performed Overall Cognitive Status: Impaired/Different from baseline Area of Impairment: Orientation;Following commands;Awareness;Problem solving;Memory                 Orientation Level:  Disoriented to;Situation;Place;Time   Memory: Decreased short-term memory Following Commands: Follows one step commands with increased time   Awareness: Emergent Problem Solving: Slow processing;Requires verbal cues        General Comments General comments (skin integrity, edema, etc.): pt's daughter present throughout.  Pt was incontinent of urine in her "depends" and needed further toileting.  Pt report stiffness in her neck,  visually seeing lines going up and down and being just a little dizzy.  Her sitting BP at this time was 124/76.    Exercises     Assessment/Plan    PT Assessment Patient needs continued PT services  PT Problem List Decreased strength;Decreased activity tolerance;Decreased balance;Decreased mobility;Decreased cognition;Decreased knowledge of use of DME       PT Treatment Interventions DME instruction;Gait training;Functional mobility training;Therapeutic activities;Balance training;Patient/family education    PT Goals (Current goals can be found in the Care Plan section)  Acute Rehab PT Goals Patient Stated Goal: "Go to rehab to get stronger" PT Goal Formulation: With patient/family Time For Goal Achievement: 12/31/18 Potential to Achieve Goals: Good    Frequency Min 2X/week   Barriers to discharge        Co-evaluation               AM-PAC PT "6 Clicks" Mobility  Outcome Measure Help needed turning from your back to your side while in a flat bed without using bedrails?: A Little Help needed moving from lying on your back to sitting on the side of a flat bed without using bedrails?: A Little Help needed moving to and from a bed to a chair (including a wheelchair)?: A Little Help needed standing up from a chair using your arms (e.g., wheelchair or bedside chair)?: A Little Help needed to walk in hospital room?: A Little Help needed climbing 3-5 steps with a railing? : A Lot 6 Click Score: 17    End of Session   Activity Tolerance:  Patient tolerated treatment well Patient left: in bed;with call bell/phone within reach;with bed alarm set;with family/visitor present Nurse Communication: Mobility status PT Visit Diagnosis: Unsteadiness on feet (R26.81);Muscle weakness (generalized) (M62.81)    Time: 4917-9150 PT Time Calculation (min) (ACUTE ONLY): 33 min   Charges:   PT Evaluation $PT Eval Moderate Complexity: 1 Mod PT Treatments $Gait Training: 8-22 mins        12/24/2018  Donnella Sham, PT Acute Rehabilitation Services [phone]  (pager) [phone]  (office)  Tessie Fass Carrie Usery 12/24/2018, 2:04 PM

## 2018-12-24 NOTE — Progress Notes (Signed)
Pt's daughter refused medication administration this morning, she states she is responsible for pt's medication and will administer medication brought from home.  RN provided extensive education, reminded pt's daughter that all medication has to be sent to pharmacy and home medication should not be administered to pt by family. Daughter said she has already explained to the physician and that RN should make a note in the computer so no one will offer pt any hospital medication. Pt's daughter asked for RN's name for the record.  RN will continue to assess patient for changes. All morning medication returned back to the [facility]. MD notified

## 2018-12-24 NOTE — Code Documentation (Addendum)
85 yo female from [facility] with reports of change in mental status per nursing staff and family. Pt has been on [facility] all day and was admitted for abdominal pain. Pt was seen at 1445 at her baseline per nursing staff. At 40, family called staff into room saying the patient seemed confused and had slight change in mental state. Code Stroke was activated due to sudden changes. Stroke Team met patient in her room. Upon examination, pt had no focal deficits. No weakness noted. Pt was unable to answer age and month - which family reports is not normal for the patient. Pt taken for CT. CT Head negative for hemorrhage. Not a tPA candidate due to too mild to treat. No LVO symptoms noted for patient to have CTA completed. Pt taken back to [facility] by this RN and Rapid Response. Patient remains in the tPA window until 1915. Handoff given to [place], Therapist, sports.

## 2018-12-24 NOTE — Plan of Care (Signed)
Pt is progressing toward desired goal 

## 2018-12-24 NOTE — Progress Notes (Signed)
PROGRESS NOTE  Ashlee Reyes LGX:[id number] DOB: [birth year] DOA: 12/23/2018 PCP: Abner Greenspan, MD  HPI/Brief Narrative  Ashlee Reyes is a 85 y.o. year old female with medical history significant for AAA (repair 2014), Plavix therapy for prior CVA, asthmawho presented on 12/23/2018 with dizziness and lightheadedness while sitting and was found to have hypertensive urgency and orthostatic hypotension.  Subjective No acute complaints currently  Assessment/Plan:  #Acute confusion Occurred in the afternoon per report from nurses.  Patient was unable to answer orientation questions and code stroke was called.  Repeat CT imaging unremarkable, MRI brain obtained 1 day prior was negative for any acute intracranial abnormalities we will continue neurochecks.  Suspect this most likely delirium in setting of MCI.  Otherwise patient has no acute neurologic deficits.  #Syncope Related to orthostatic.  Orthostatic vitals were positive with DBP greater than 10 mmHg change( supine 162/69, sitting 161/84, standing 147/116).  Continue to monitor telemetry.  #Hypertensive urgency Blood pressure elevated 21/97 on admission.  Much improved but still has significantly elevated diastolic.  No home BP medications.  Given orthostatic concern will start low-dose amlodipine 2.5 mg daily and monitor closely.  #History of nonhemorrhagic CVA. Prior CT imaging showed mild ischemic changes consistent with chronic infarcts.  Patient had symptoms of slurred speech in 2016 and was advised to continue Plavix per her neurologist at that time and has continued since  #Hyperlipidemia, stable Continue home statins  #Chronic back pain We will hold off on home oxycodone given some acute confusion.  Will add PRN Tylenol  #COPD No dyspnea or wheezing on exam. PRN albuterol. Takes qvar daily  #history of Chronic UTIs/interstitial cystitis Daughter reports chronic history for which she takes chronic Bactrim UA  and urine culture here are unremarkable, continue home medications  #GERD Continue PPI  #H/o of AAA repair (stent in 2014) Evaluated with duplex ultrasound on 11/2018 found to have diameter of 3.7 cm, unchanged from prior.  Will continue outpatient follow-up 18 months with vascular       DVT prophylaxis: Consultants: Neurology  Procedures:  none     Code Status: DNR   Family Communication: Daughter update at bedside   Disposition Plan: Pending further neurology recommendations, will need to continue neurochecks, continue syncope work-up, improvement in blood pressure and orthostatic hypotension.        Objective: Vitals:   12/24/18 1656 12/24/18 1715 12/24/18 1730 12/24/18 1800  BP: (!) 128/57 (!) 171/79 (!) 131/58 (!) 147/108  Pulse:  83 75   Resp: 17 20 20 20   Temp: 98 F (36.7 C)     TempSrc: Axillary     SpO2: 95% 99% 98% 98%   No intake or output data in the 24 hours ending 12/24/18 1821 There were no vitals filed for this visit.  Exam:  Constitutional:normal appearing elderly female, no distress Eyes: EOMI, anicteric, normal conjunctivae ENMT: Oropharynx with moist mucous membranes Neck: FROM Cardiovascular: RRR no MRGs, with no peripheral edema Respiratory: Normal respiratory effort, clear breath sounds  Abdomen: Soft,non-tender, normal bowel sounds Skin: No rash ulcers, or lesions. Without skin tenting  Neurologic: Grossly no focal neuro deficit. Psychiatric:Appropriate affect, and mood. Mental status alert, oriented to self, context, and place  Data Reviewed: CBC: Recent Labs  Lab 12/23/18 0957 12/23/18 1007  WBC 7.7  --   NEUTROABS 6.2  --   HGB 13.5 14.6  HCT 44.0 43.0  MCV 90.7  --   PLT 298  --  Basic Metabolic Panel: Recent Labs  Lab 12/23/18 0957 12/23/18 1007 12/24/18 0549  NA 138 140 139  K 4.0 4.1 3.5  CL 104 103 108  CO2 26  --  23  GLUCOSE 114* 112* 90  BUN 8 9 7*  CREATININE 0.90 0.90 0.90  CALCIUM 9.2  --   8.7*   GFR: CrCl cannot be calculated (Unknown ideal weight.). Liver Function Tests: Recent Labs  Lab 12/23/18 0957  AST 18  ALT 13  ALKPHOS 54  BILITOT 0.6  PROT 6.8  ALBUMIN 3.6   No results for input(s): LIPASE, AMYLASE in the last 168 hours. No results for input(s): AMMONIA in the last 168 hours. Coagulation Profile: Recent Labs  Lab 12/23/18 0957  INR 0.97   Cardiac Enzymes: Recent Labs  Lab 12/23/18 2152 12/24/18 0549  TROPONINI <0.03 <0.03   BNP (last 3 results) No results for input(s): PROBNP in the last 8760 hours. HbA1C: No results for input(s): HGBA1C in the last 72 hours. CBG: Recent Labs  Lab 12/24/18 1533  GLUCAP 88   Lipid Profile: No results for input(s): CHOL, HDL, LDLCALC, TRIG, CHOLHDL, LDLDIRECT in the last 72 hours. Thyroid Function Tests: No results for input(s): TSH, T4TOTAL, FREET4, T3FREE, THYROIDAB in the last 72 hours. Anemia Panel: No results for input(s): VITAMINB12, FOLATE, FERRITIN, TIBC, IRON, RETICCTPCT in the last 72 hours. Urine analysis:    Component Value Date/Time   COLORURINE STRAW (A) 12/23/2018 0943   APPEARANCEUR CLEAR 12/23/2018 0943   LABSPEC 1.008 12/23/2018 0943   PHURINE 8.0 12/23/2018 0943   GLUCOSEU NEGATIVE 12/23/2018 0943   HGBUR MODERATE (A) 12/23/2018 0943   HGBUR small 07/28/2009 1142   BILIRUBINUR NEGATIVE 12/23/2018 0943   BILIRUBINUR negative 08/20/2018 1058   KETONESUR NEGATIVE 12/23/2018 0943   PROTEINUR NEGATIVE 12/23/2018 0943   UROBILINOGEN 0.2 08/20/2018 1058   UROBILINOGEN 0.2 01/11/2015 1128   NITRITE NEGATIVE 12/23/2018 0943   LEUKOCYTESUR NEGATIVE 12/23/2018 0943   Sepsis Labs: @LABRCNTIP (procalcitonin:4,lacticidven:4)  )No results found for this or any previous visit (from the past 240 hour(s)).    Studies: Mr Brain Wo Contrast  Result Date: 12/23/2018 CLINICAL DATA:  Recurrent syncope EXAM: MRI HEAD WITHOUT CONTRAST TECHNIQUE: Multiplanar, multiecho pulse sequences of the  brain and surrounding structures were obtained without intravenous contrast. COMPARISON:  Head CT 12/23/2018 FINDINGS: The examination had to be discontinued prior to completion due to the patient's back pain and inability to remain in the scanner. Axial and coronal DWI, axial T2, axial FLAIR and axial GRE sequences were obtained. BRAIN: There is no acute infarct, acute hemorrhage, hydrocephalus or extra-axial collection. The midline structures are normal. No midline shift or other mass effect. There are old bilateral cerebellar infarcts and old bilateral basal ganglia lacunar infarcts. Early confluent hyperintense T2-weighted signal of the periventricular and deep white matter, most commonly due to chronic ischemic microangiopathy. Generalized atrophy without lobar predilection. Susceptibility-sensitive sequences show no chronic microhemorrhage or superficial siderosis. VASCULAR: Major intracranial arterial and venous sinus flow voids are normal. SKULL AND UPPER CERVICAL SPINE: Calvarial bone marrow signal is normal. There is no skull base mass. Visualized upper cervical spine and soft tissues are normal. SINUSES/ORBITS: No fluid levels or advanced mucosal thickening. No mastoid or middle ear effusion. The orbits are normal. IMPRESSION: 1. Truncated examination. 2. No acute intracranial abnormality. 3. 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 Electronically Signed   By: Ulyses Jarred M.D.   On: 12/23/2018 19:51   Ct Head Code Stroke Wo  Contrast  Result Date: 12/24/2018 CLINICAL DATA:  Code stroke.  Slurred speech. EXAM: CT HEAD WITHOUT CONTRAST TECHNIQUE: Contiguous axial images were obtained from the base of the skull through the vertex without intravenous contrast. COMPARISON:  MR brain 12/23/2018. CTA head neck 12/23/2018 FINDINGS: Brain: No evidence of acute infarction, hemorrhage, hydrocephalus, extra-axial collection or mass lesion/mass effect. Remote RIGHT cerebellar infarcts.  Advanced atrophy. Hypoattenuation of white matter, likely small vessel disease. Vascular: Calcification of the cavernous internal carotid arteries and distal vertebral arteries consistent with cerebrovascular atherosclerotic disease. No signs of intracranial large vessel occlusion. Skull: Normal. Negative for fracture or focal lesion. Sinuses/Orbits: No acute finding. Other: None ASPECTS ([place] Stroke Program Early CT Score) - Ganglionic level infarction (caudate, lentiform nuclei, internal capsule, insula, M1-M3 cortex): 7 - Supraganglionic infarction (M4-M6 cortex): 3 Total score (0-10 with 10 being normal): 10 IMPRESSION: 1. Atrophy and small vessel disease. No acute intracranial findings. 2. ASPECTS is 10. 3. These results were communicated to Dr. Lorraine Lax at 3:54 pmon 1/8/2020by text page via the [facility] messaging system. * Electronically Signed   By: Staci Righter M.D.   On: 12/24/2018 15:55    Scheduled Meds: . acidophilus  1 capsule Oral Daily  . amLODipine  2.5 mg Oral Daily  . clopidogrel  75 mg Oral Daily  . fluticasone  2 spray Each Nare Daily  . heparin  5,000 Units Subcutaneous Q8H  . latanoprost  1 drop Both Eyes QHS  . loratadine  10 mg Oral Daily  . pantoprazole  40 mg Oral Daily  . sodium chloride flush  3 mL Intravenous Q12H    Continuous Infusions: . sodium chloride       LOS: 1 day     Desiree Hane, MD [facility] Pager [phone]  If 7PM-7AM, please contact night-coverage [email or web address] Password [email or web address] 12/24/2018, 6:21 PM

## 2018-12-25 MED ORDER — TRAMADOL HCL 50 MG PO TABS
50.0000 mg | ORAL_TABLET | Freq: Every day | ORAL | Status: DC
  Administered 2018-12-25: 50 mg via ORAL
  Filled 2018-12-25: qty 1

## 2018-12-25 MED ORDER — TRAMADOL HCL 50 MG PO TABS
25.0000 mg | ORAL_TABLET | Freq: Every day | ORAL | Status: DC
  Administered 2018-12-25 – 2018-12-26 (×2): 25 mg via ORAL
  Filled 2018-12-25 (×2): qty 1

## 2018-12-25 NOTE — Progress Notes (Signed)
Bladder scan revealed >931, will do an in and out cath.

## 2018-12-25 NOTE — Clinical Social Work Note (Signed)
Clinical Social Work Assessment  Patient Details  Name: Ashlee Reyes MRN: [id number] Date of Birth: [birth year]  Date of referral:  12/25/18               Reason for consult:  Facility Placement                Permission sought to share information with:  Facility Sport and exercise psychologist, Family Supports Permission granted to share information::  Yes, Verbal Permission Granted  Name::     Ashlee Reyes  Agency::  SNF  Relationship::  Daughters  Sport and exercise psychologist Information:     Housing/Transportation Living arrangements for the past 2 months:  Single Family Home Source of Information:  Patient, Medical Team, Adult Children Patient Interpreter Needed:  None Criminal Activity/Legal Involvement Pertinent to Current Situation/Hospitalization:  No - Comment as needed Significant Relationships:  Adult Children Lives with:  Self, Adult Children Do you feel safe going back to the place where you live?  Yes Need for family participation in patient care:  Yes (Comment)  Care giving concerns:  Patient from home with daughter, but will benefit from short term SNF at discharge.   Social Worker assessment / plan:  CSW met with patient and daughters at bedside to discuss recommendation for SNF. CSW engaged patient in discussion, and daughter, Ashlee Reyes, indicated that CSW needed to be making decisions with her instead of patient. CSW asked patient if that was ok, and patient said she didn't care about anything except taking her pain away in her neck and her back. Ashlee Reyes discussed how she has already reached out to [facility] and was told that they would keep a bed available for her when she was ready for discharge, for CSW to let them know. CSW completed referral information and faxed to [facility]. CSW to follow.  Employment status:  Retired Forensic scientist:  Medicare PT Recommendations:  [place] / Referral to community resources:  [place]  Patient/Family's  Response to care:  Patient and daughters agreeable to SNF placement.  Patient/Family's Understanding of and Emotional Response to Diagnosis, Current Treatment, and Prognosis:  Patient's daughter, Ashlee Reyes, said that the patient lives with her and she's the one who's been taking care of her, so that she knows that she's not where she was before she came in to the hospital. Patient's daughter indicated that they have had a lot of family over at [place] and they know the owners, so she will cause a fit if she has to in order to get her mother over there. Patient attempted to engage in conversation with CSW questions, but said she's in so much pain that she can't focus on anything else. Patient and daughter said they would discuss the pain with the MD when she came to see them.  Emotional Assessment Appearance:  Appears stated age Attitude/Demeanor/Rapport:  Engaged Affect (typically observed):  Pleasant Orientation:  Oriented to Self, Oriented to Place Alcohol / Substance use:  Not Applicable Psych involvement (Current and /or in the community):  No (Comment)  Discharge Needs  Concerns to be addressed:  Care Coordination Readmission within the last 30 days:  No Current discharge risk:  Physical Impairment, Dependent with Mobility Barriers to Discharge:  Continued Medical Work up, [place], [place] 12/25/2018, 12:36 PM

## 2018-12-25 NOTE — Consult Note (Signed)
[facility] CM Primary Care Navigator  12/25/2018  Kadijah Shamoon 05-11-1921 [id number]   Met withpatient and daughters [name] and [name]) at the bedsidetoidentify possible discharge needs. Patient was dozing on and off during this visit.  Daughter [name]) reports that patient had "complaints of severe headache, dizziness with elevated blood pressure on the 200's and had passed out" that had resulted to this admission. (acute confusion, uncontrolled blood pressure, syncopal episode, hypertensive urgency and orthostatic hypotension)   Patient's daughter endorses Dr. Loura Pardon with [facility] at [facility] as the primary care provider for patient.   [facility] [place] [facility] any problem so far.  Patient's daughter [name]) has been managing her medications at home using "pill box" system filled once a week.   She also provides transportation for patient to her doctors' appointments when patient is able and strong enough to be transported, otherwise, she calls and maintains close contact with the providers, as stated.  The patient lives with daughter [name]) who serves as her primary caregiver at home. Daughters have been supportive of patient's care.  Anticipated plan for discharge isskilled nursing facility (SNF)per therapy recommendation.   According to daughter, plan for long term placement after rehab is a possibility and are looking at the facility where patient's brother is currently residing.  Patient expressedunderstandingto callprimarycare provider's office if ever patient will return back home, for a post discharge follow- up visitwithin1- 2 weeksor sooner if needs arise.Patient letter (with PCP's contact number) was provided as their reminder.  Explained topatient's daughters regarding [facility] CM services available for health management and resources at home,but they are unsure  of the plan at this point. Daughter states that patient's improvement will dictate the plan after discharge from rehab facility. Daughters were encouraged, andboth verbalized understandingto seek referral from primary care provider to [facility] care management ifdeemed necessary and appropriatefor any servicesin the future- if ever patientgetsback home.   [facility] care management information was provided for future needs thatpatientmay have.  Primary care provider's office is listed as providing transition of care (TOC) follow-up.   For additional questions please contact:  Edwena Felty A. Xzayvier Fagin, BSN, RN-BC [facility] PRIMARY CARE Navigator Cell: [phone]

## 2018-12-25 NOTE — [place] FL2 (Signed)
[place] MEDICAID FL2 LEVEL OF CARE SCREENING TOOL     IDENTIFICATION  Patient Name: Ashlee Reyes Birthdate: 07-29-1921 Sex: female Admission Date (Current Location): 12/23/2018  [facility] and [place] Number:  Herbalist and Address:  [facility], [place] [address] [place],  [address]      Provider Number: [id number]  Attending Physician Name and Address:  Desiree Hane, MD  Relative Name and Phone Number:       Current Level of Care: Hospital Recommended Level of Care: [place] Prior Approval Number:    Date Approved/Denied:   PASRR Number: [id number]  Discharge Plan: SNF    Current Diagnoses: Patient Active Problem List   Diagnosis Date Noted  . Hypertensive urgency 12/24/2018  . Compression fracture of L1 lumbar vertebra (HCC) 09/26/2018  . Mobility impaired 09/26/2018  . History of endovascular stent graft for abdominal aortic aneurysm (AAA) 01/01/2018  . Unilateral primary osteoarthritis, left knee 07/11/2017  . Hyperlipidemia 03/12/2017  . Frequent UTI/interstitial cystitis 02/06/2016  . Hypotension 01/09/2016  . Slurred speech 06/01/2015  . Carotid stenosis 06/01/2015  . Local reaction to immunization 02/21/2015  . Encounter for Medicare annual wellness exam 02/15/2015  . Estrogen deficiency 02/15/2015  . Sinusitis, chronic 01/12/2015  . IBS (irritable bowel syndrome) 01/12/2015  . Orthostatic hypotension 01/11/2015  . Dizziness   . Palpitations 05/13/2014  . Second degree heart block 10/18/2013  . Syncope 10/17/2013  . Aneurysm of abdominal vessel ([place]) 06/25/2013  . Osteopenia 01/13/2013  . CAD (coronary artery disease)   . Chronic depression 06/03/2007  . GERD 06/02/2007  . Asthma   . Chronic interstitial cystitis   . Osteoarthritis   . Spinal stenosis of lumbar region   . History of urinary tract infection     Orientation RESPIRATION BLADDER Height & Weight     Self, Place  Normal Incontinent, External catheter Weight:   Height:     BEHAVIORAL SYMPTOMS/MOOD NEUROLOGICAL BOWEL NUTRITION STATUS      Continent Diet(see DC Summary)  AMBULATORY STATUS COMMUNICATION OF NEEDS Skin   Limited Assist Verbally Normal                       Personal Care Assistance Level of Assistance  Bathing, Feeding, Dressing Bathing Assistance: Limited assistance Feeding assistance: Limited assistance Dressing Assistance: Limited assistance     Functional Limitations Info  Sight, Hearing, Speech Sight Info: Adequate Hearing Info: Adequate Speech Info: Adequate    SPECIAL CARE FACTORS FREQUENCY  PT (By licensed PT), OT (By licensed OT)     PT Frequency: 5x/wk OT Frequency: 5x/wk            Contractures Contractures Info: Not present    Additional Factors Info  Code Status, Allergies Code Status Info: DNR Allergies Info: Amoxicillin-pot Clavulanate, Avelox Moxifloxacin, Flovent Hfa Fluticasone, Ibuprofen, Keflex Cephalexin, Nitrofurantoin, Omeprazole, Prevnar Pneumococcal 13-val Conj Vacc, Rosuvastatin, Sertraline Hcl, Tetanus Toxoids           Current Medications (12/25/2018):  This is the current hospital active medication list Current Facility-Administered Medications  Medication Dose Route Frequency Provider Last Rate Last Dose  . 0.9 %  sodium chloride infusion  250 mL Intravenous PRN Merton Border, MD      . acetaminophen (TYLENOL) tablet 650 mg  650 mg Oral Q6H PRN Oretha Milch D, MD   650 mg at 12/25/18 0750  . acidophilus (RISAQUAD) capsule 1 capsule  1 capsule Oral  Daily Merton Border, MD   1 capsule at 12/25/18 1011  . albuterol (PROVENTIL) (2.5 MG/3ML) 0.083% nebulizer solution 2.5 mg  2.5 mg Inhalation Q4H PRN Oretha Milch D, MD      . amLODipine (NORVASC) tablet 2.5 mg  2.5 mg Oral Daily Oretha Milch D, MD   2.5 mg at 12/25/18 1011  . clopidogrel (PLAVIX) tablet 75 mg  75 mg Oral Daily Merton Border, MD   75 mg at 12/25/18 1011  . dimenhyDRINATE  (DRAMAMINE) tablet 25 mg  25 mg Oral Q8H PRN Merton Border, MD      . docusate sodium (COLACE) capsule 100 mg  100 mg Oral QPM Oretha Milch D, MD   100 mg at 12/24/18 1849  . fluticasone (FLONASE) 50 MCG/ACT nasal spray 2 spray  2 spray Each Nare Daily Merton Border, MD      . heparin injection 5,000 Units  5,000 Units Subcutaneous Q8H Merton Border, MD   5,000 Units at 12/25/18 0523  . latanoprost (XALATAN) 0.005 % ophthalmic solution 1 drop  1 drop Both Eyes QHS Hijazi, Deatra Canter, MD      . loratadine (CLARITIN) tablet 10 mg  10 mg Oral Daily Merton Border, MD   10 mg at 12/25/18 1011  . ondansetron (ZOFRAN) injection 4 mg  4 mg Intravenous Q6H PRN Merton Border, MD      . ondansetron (ZOFRAN-ODT) disintegrating tablet 4 mg  4 mg Oral Q8H PRN Merton Border, MD   4 mg at 12/24/18 1654  . pantoprazole (PROTONIX) EC tablet 40 mg  40 mg Oral Daily Merton Border, MD   40 mg at 12/25/18 1011  . polyethylene glycol (MIRALAX / GLYCOLAX) packet 17 g  17 g Oral Daily PRN Oretha Milch D, MD      . polyvinyl alcohol (LIQUIFILM TEARS) 1.4 % ophthalmic solution 1 drop  1 drop Both Eyes PRN Merton Border, MD      . sodium chloride flush (NS) 0.9 % injection 3 mL  3 mL Intravenous Q12H Merton Border, MD   3 mL at 12/25/18 1013  . sodium chloride flush (NS) 0.9 % injection 3 mL  3 mL Intravenous PRN Merton Border, MD         Discharge Medications: Please see discharge summary for a list of discharge medications.  Relevant Imaging Results:  Relevant Lab Results:   Additional Information SS#: [id number]  Geralynn Ochs, LCSW

## 2018-12-25 NOTE — Progress Notes (Addendum)
STROKE TEAM PROGRESS NOTE   INTERVAL HISTORY Her daughter is at the bedside.  Patient in bed, awake, alert. Displays some confusion. Per daughter there has been a decline in patient's memory over the last 6 months. Has not been evaluated by a Neurologist for this.   Vitals:   12/25/18 0423 12/25/18 0500 12/25/18 0700 12/25/18 0900  BP:  (!) 112/56 124/62 (!) 115/57  Pulse: (!) 118 90 77   Resp:  20 20   Temp: 98.1 F (36.7 C)     TempSrc: Oral     SpO2: 100%       CBC:  Recent Labs  Lab 12/23/18 0957 12/23/18 1007  WBC 7.7  --   NEUTROABS 6.2  --   HGB 13.5 14.6  HCT 44.0 43.0  MCV 90.7  --   PLT 298  --     Basic Metabolic Panel:  Recent Labs  Lab 12/23/18 0957 12/23/18 1007 12/24/18 0549  NA 138 140 139  K 4.0 4.1 3.5  CL 104 103 108  CO2 26  --  23  GLUCOSE 114* 112* 90  BUN 8 9 7*  CREATININE 0.90 0.90 0.90  CALCIUM 9.2  --  8.7*   Lipid Panel:     Component Value Date/Time   CHOL 240 (H) 07/08/2018 0844   TRIG 110.0 07/08/2018 0844   HDL 54.30 07/08/2018 0844   CHOLHDL 4 07/08/2018 0844   VLDL 22.0 07/08/2018 0844   LDLCALC 164 (H) 07/08/2018 0844   HgbA1c: No results found for: HGBA1C Urine Drug Screen:     Component Value Date/Time   LABOPIA NONE DETECTED 12/23/2018 1102   COCAINSCRNUR NONE DETECTED 12/23/2018 1102   LABBENZ NONE DETECTED 12/23/2018 1102   AMPHETMU NONE DETECTED 12/23/2018 1102   THCU NONE DETECTED 12/23/2018 1102   LABBARB NONE DETECTED 12/23/2018 1102    Alcohol Level     Component Value Date/Time   ETH <10 12/23/2018 0957    IMAGING Ct Angio Head W Or Wo Contrast  Result Date: 12/23/2018 CLINICAL DATA:  Focal neuro deficit greater than 6 hours. Suspect stroke. Aphasia, syncope EXAM: CT ANGIOGRAPHY HEAD AND NECK TECHNIQUE: Multidetector CT imaging of the head and neck was performed using the standard protocol during bolus administration of intravenous contrast. Multiplanar CT image reconstructions and MIPs were  obtained to evaluate the vascular anatomy. Carotid stenosis measurements (when applicable) are obtained utilizing NASCET criteria, using the distal internal carotid diameter as the denominator. CONTRAST:  124mL ISOVUE-370 IOPAMIDOL (ISOVUE-370) INJECTION 76% COMPARISON:  CT head 12/23/2018 FINDINGS: CTA NECK FINDINGS Aortic arch: Atherosclerotic aortic arch. Atherosclerotic disease and mild stenosis in the subclavian artery bilaterally. Right carotid system: Mild atherosclerotic calcification right carotid bifurcation without significant stenosis. Left carotid system: Mild stenosis distal left common carotid artery. Mild atherosclerotic disease left internal carotid artery without stenosis. Vertebral arteries: Both vertebral arteries widely patent in the neck. Skeleton: No acute skeletal abnormality. Other neck: Negative for mass or adenopathy Upper chest: 2 cm sub solid density right upper lobe. Follow-up recommended. Probable scarring in the right upper lobe anteriorly and in the left upper lobe. Review of the MIP images confirms the above findings CTA HEAD FINDINGS Anterior circulation: Atherosclerotic calcification and mild stenosis in the cavernous carotid bilaterally. Anterior and middle cerebral arteries patent bilaterally without significant stenosis or occlusion Posterior circulation: Mild stenosis distal vertebral artery bilaterally. Basilar widely patent. Right PICA patent. Left PICA not visualized. Superior cerebellar and posterior cerebral arteries patent bilaterally without stenosis. Venous  sinuses: Patent Anatomic variants: None Delayed phase: Normal enhancement on delayed imaging. Review of the MIP images confirms the above findings IMPRESSION: 1. Negative for emergent large vessel occlusion 2. Mild stenosis distal left common carotid artery. Carotid bifurcation mildly disease bilaterally without significant stenosis. Mild atherosclerotic stenosis in the cavernous carotid bilaterally. 3. Mild  stenosis distal vertebral artery bilaterally. 4. 2 cm sub solid density right upper lobe. Recommend CT chest at this time for baseline evaluation. This could be due to scarring or neoplasm. Electronically Signed   By: Franchot Gallo M.D.   On: 12/23/2018 13:25   Dg Chest 2 View  Result Date: 12/23/2018 CLINICAL DATA:  Syncope. EXAM: CHEST - 2 VIEW COMPARISON:  Chest x-ray dated January 01, 2018. FINDINGS: Stable mild cardiomegaly. Normal pulmonary vascularity. Atherosclerotic calcification of the aortic arch. No focal consolidation, pleural effusion, or pneumothorax. No acute osseous abnormality. Chronic L1 compression deformity. IMPRESSION: No active cardiopulmonary disease. Electronically Signed   By: Titus Dubin M.D.   On: 12/23/2018 13:31   Ct Head Wo Contrast  Result Date: 12/23/2018 CLINICAL DATA:  Ataxia with stroke suspected.  Syncope. EXAM: CT HEAD WITHOUT CONTRAST TECHNIQUE: Contiguous axial images were obtained from the base of the skull through the vertex without intravenous contrast. COMPARISON:  02/20/2018 FINDINGS: Brain: No evidence of acute infarction, hemorrhage, hydrocephalus, extra-axial collection or mass lesion/mass effect. Small remote right cerebellar infarct. Mild for age cerebral volume loss. Stable pattern of chronic small vessel ischemia in the deep cerebral white matter. Vascular: Atherosclerotic calcification Skull: No acute finding Sinuses/Orbits: Bilateral cataract resection.  No acute finding. IMPRESSION: No acute finding or change from prior. Electronically Signed   By: Monte Fantasia M.D.   On: 12/23/2018 10:49   Ct Angio Neck W And/or Wo Contrast  Result Date: 12/23/2018 CLINICAL DATA:  Focal neuro deficit greater than 6 hours. Suspect stroke. Aphasia, syncope EXAM: CT ANGIOGRAPHY HEAD AND NECK TECHNIQUE: Multidetector CT imaging of the head and neck was performed using the standard protocol during bolus administration of intravenous contrast. Multiplanar CT image  reconstructions and MIPs were obtained to evaluate the vascular anatomy. Carotid stenosis measurements (when applicable) are obtained utilizing NASCET criteria, using the distal internal carotid diameter as the denominator. CONTRAST:  150mL ISOVUE-370 IOPAMIDOL (ISOVUE-370) INJECTION 76% COMPARISON:  CT head 12/23/2018 FINDINGS: CTA NECK FINDINGS Aortic arch: Atherosclerotic aortic arch. Atherosclerotic disease and mild stenosis in the subclavian artery bilaterally. Right carotid system: Mild atherosclerotic calcification right carotid bifurcation without significant stenosis. Left carotid system: Mild stenosis distal left common carotid artery. Mild atherosclerotic disease left internal carotid artery without stenosis. Vertebral arteries: Both vertebral arteries widely patent in the neck. Skeleton: No acute skeletal abnormality. Other neck: Negative for mass or adenopathy Upper chest: 2 cm sub solid density right upper lobe. Follow-up recommended. Probable scarring in the right upper lobe anteriorly and in the left upper lobe. Review of the MIP images confirms the above findings CTA HEAD FINDINGS Anterior circulation: Atherosclerotic calcification and mild stenosis in the cavernous carotid bilaterally. Anterior and middle cerebral arteries patent bilaterally without significant stenosis or occlusion Posterior circulation: Mild stenosis distal vertebral artery bilaterally. Basilar widely patent. Right PICA patent. Left PICA not visualized. Superior cerebellar and posterior cerebral arteries patent bilaterally without stenosis. Venous sinuses: Patent Anatomic variants: None Delayed phase: Normal enhancement on delayed imaging. Review of the MIP images confirms the above findings IMPRESSION: 1. Negative for emergent large vessel occlusion 2. Mild stenosis distal left common carotid artery. Carotid bifurcation mildly disease  bilaterally without significant stenosis. Mild atherosclerotic stenosis in the cavernous  carotid bilaterally. 3. Mild stenosis distal vertebral artery bilaterally. 4. 2 cm sub solid density right upper lobe. Recommend CT chest at this time for baseline evaluation. This could be due to scarring or neoplasm. Electronically Signed   By: Franchot Gallo M.D.   On: 12/23/2018 13:25   Mr Brain Wo Contrast  Result Date: 12/23/2018 CLINICAL DATA:  Recurrent syncope EXAM: MRI HEAD WITHOUT CONTRAST TECHNIQUE: Multiplanar, multiecho pulse sequences of the brain and surrounding structures were obtained without intravenous contrast. COMPARISON:  Head CT 12/23/2018 FINDINGS: The examination had to be discontinued prior to completion due to the patient's back pain and inability to remain in the scanner. Axial and coronal DWI, axial T2, axial FLAIR and axial GRE sequences were obtained. BRAIN: There is no acute infarct, acute hemorrhage, hydrocephalus or extra-axial collection. The midline structures are normal. No midline shift or other mass effect. There are old bilateral cerebellar infarcts and old bilateral basal ganglia lacunar infarcts. Early confluent hyperintense T2-weighted signal of the periventricular and deep white matter, most commonly due to chronic ischemic microangiopathy. Generalized atrophy without lobar predilection. Susceptibility-sensitive sequences show no chronic microhemorrhage or superficial siderosis. VASCULAR: Major intracranial arterial and venous sinus flow voids are normal. SKULL AND UPPER CERVICAL SPINE: Calvarial bone marrow signal is normal. There is no skull base mass. Visualized upper cervical spine and soft tissues are normal. SINUSES/ORBITS: No fluid levels or advanced mucosal thickening. No mastoid or middle ear effusion. The orbits are normal. IMPRESSION: 1. Truncated examination. 2. No acute intracranial abnormality. 3. 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 Electronically Signed   By: Ulyses Jarred M.D.   On: 12/23/2018 19:51   Ct Head Code  Stroke Wo Contrast  Result Date: 12/24/2018 CLINICAL DATA:  Code stroke.  Slurred speech. EXAM: CT HEAD WITHOUT CONTRAST TECHNIQUE: Contiguous axial images were obtained from the base of the skull through the vertex without intravenous contrast. COMPARISON:  MR brain 12/23/2018. CTA head neck 12/23/2018 FINDINGS: Brain: No evidence of acute infarction, hemorrhage, hydrocephalus, extra-axial collection or mass lesion/mass effect. Remote RIGHT cerebellar infarcts. Advanced atrophy. Hypoattenuation of white matter, likely small vessel disease. Vascular: Calcification of the cavernous internal carotid arteries and distal vertebral arteries consistent with cerebrovascular atherosclerotic disease. No signs of intracranial large vessel occlusion. Skull: Normal. Negative for fracture or focal lesion. Sinuses/Orbits: No acute finding. Other: None ASPECTS ([place] Stroke Program Early CT Score) - Ganglionic level infarction (caudate, lentiform nuclei, internal capsule, insula, M1-M3 cortex): 7 - Supraganglionic infarction (M4-M6 cortex): 3 Total score (0-10 with 10 being normal): 10 IMPRESSION: 1. Atrophy and small vessel disease. No acute intracranial findings. 2. ASPECTS is 10. 3. These results were communicated to Dr. Lorraine Lax at 3:54 pmon 1/8/2020by text page via the [facility] messaging system. * Electronically Signed   By: Staci Righter M.D.   On: 12/24/2018 15:55    PHYSICAL EXAM Pleasant elderly Caucasian lady not in distress. She is hard of hearing. . Afebrile. Head is nontraumatic. Neck is supple without bruit.    Cardiac exam no murmur or gallop. Lungs are clear to auscultation. Distal pulses are well felt. Neurological Exam.:  Awake alert oriented to place and person only. Diminished attention, registration and recall. Poor insight into illness. Able to name only 4 animals with 4 legs.close simple one and two-step commands. Speech is normal. Eye movements are full range without nystagmus. Blinks to threat  bilaterally. Fundi not visualized. Tongue midline. No facial  weakness. Motor system exam no solid 4 extremities equally well against gravity but left upper extremity movements limited due to mechanical elbow pain. No focal weakness. Deep tendon defects are symmetric. Plantars downgoing. Gait not tested. ASSESSMENT/PLAN Ms. Aleicia Kenagy is a 85 y.o. female with history of HLD, CAD, A fib, AAA presenting with dizziness, syncope and uncontrolled HTN.  Work up this far has been MRI brain neg, CTA h/n shows some min bilat carotid stenosis, Bilat distal vert stenosis. Echo pending. Tele without arrythmia. Today the pts dtr says she became confused and had slurred speech after awakening from a nap. There is no focal deficits, no aphasia, just confused to date/place/age.She does have confusion at times, but family states she is not usually "this" confused. She c/o head and neck ache as well as her typical abd pain which is not new for her. No further dysarthria evident on my exam at bedside. Stat Galateo show no acute changes. Blood sugar checked= 88.  doubtTIA: likely transient confusion with baseline mild cognitive impairment  Code Stroke CT head No acute stroke. Small vessel disease. Atrophy.ASPECTS 10.    CTA head & neck 1. Negative for emergent large vessel occlusion 2. Mild stenosis distal left common carotid artery. Carotid bifurcation mildly disease bilaterally without significant stenosis. Mild atherosclerotic stenosis in the cavernous carotid bilaterally. 3. Mild stenosis distal vertebral artery bilaterally. 4. 2 cm sub solid density right upper lobe. Recommend CT chest at this time for baseline evaluation. This could be due to scarring or neoplasm.  MRI  Truncated examination.2. No acute intracranial abnormality. 3. 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 EF 65-70%. Normal RV size and systolic function. No  significant valvular  abnormalities.  LDL Pending  HgbA1c pending  Heparin for VTE prophylaxis Diet Order            Diet Heart Room service appropriate? Yes; Fluid consistency: Thin  Diet effective now               clopidogrel 75 mg daily prior to admission, now on clopidogrel 75 mg daily.   Therapy recommendations:  SNF  Disposition:  Pending; probable SNF  Hypertension  Unstable . Permissive hypertension (OK if < 220/120) but gradually normalize in 5-7 days . Long-term BP goal normotensive  Hyperlipidemia  Home meds:  none,   LDL pending, goal < 70   Continue statin at discharge  Diabetes type II  HgbA1c pending goal < 7.0  controlled  Other Stroke Risk Factors  Advanced age  Family hx stroke (Brother)  Coronary artery disease  Other Active Problems  GERD  COPD  Chronic back pain   Laurey Morale, MSN, NP-C [facility] day # 2 I have personally obtained history,examined this patient, reviewed notes, independently viewed imaging studies, participated in medical decision making and plan of care.ROS completed by me personally and pertinent positives fully documented  I have made any additions or clarifications directly to the above note. Agree with note above.  Presented with transient confusion and dizziness which appears to resolve and brain imaging does not support diagnosis of stroke. I suspect she has baseline mild cognitive impairment which had decompensated. Recommend no further stroke workup. Follow-up as an outpatient in neurology clinic for treatment for dementia and cognitive impairment if family is interested. Long discussion with patient and daughter and answered questions. Greater than 50% time during this 35 minute visit  was spent on counseling and coordination of care but TIA, cognitive impairment and answering questions. Discussed with Dr.Nettey  Antony Contras, MD Medical Director [place] Pager: [phone] 12/25/2018 4:13 PM    To contact Stroke Continuity provider, please refer to [email or web address] After hours, contact General Neurology

## 2018-12-25 NOTE — Progress Notes (Signed)
PROGRESS NOTE  Ashlee Reyes UMP:[id number] DOB: 1921-11-02 DOA: 12/23/2018 PCP: Abner Greenspan, MD  HPI/Brief Narrative  Ashlee Reyes is a 85 y.o. year old female with medical history significant for AAA (repair 2014), Plavix therapy for prior CVA, asthmawho presented on 12/23/2018 with dizziness and lightheadedness while sitting and was found to have hypertensive urgency and orthostatic hypotension.  Subjective Complains of neck pain.  This is a chronic problem that typically gets better with home tramadol and Tylenol.  Assessment/Plan:  #Acute confusion, resolved Daughter reports patient abruptly leaning over the side on 1/8 afternoon.  Code stroke was called at that time CT head showed no acute intracranial abnormalities, neurology is following.  Of note MRI brain from a day prior was negative for any stroke.  Currently patient is alert and oriented to person and place  #Syncope Related to orthostatic.  Orthostatic vitals were positive with DBP greater than 10 mmHg change( supine 162/69, sitting 161/84, standing 147/116) on 1/8 will repeat vitals on 1/9, if persists will add compression stockings.  Continue to monitor telemetry.  #Hypertensive urgency, resolved #Hypertension, new diagnosis Start amlodipine 2.5 mg on 1/9.  Blood pressure much better, monitor closely.  #Chronic neck pain Ongoing history for past 3 months.  Usually improves with Tylenol and tramadol at home.  Will resume home tramadol given no longer confused.  Add warm compresses.  Not having any neurologic deficits to be concerned about spinal pathology.  #History of nonhemorrhagic CVA. Prior CT imaging showed mild ischemic changes consistent with chronic infarcts.  Patient had symptoms of slurred speech in 2016 and was advised to continue Plavix per her neurologist at that time and has continued since  #Hyperlipidemia, stable Continue home statins  #Chronic back pain We will hold off on home  oxycodone given some acute confusion.  Will add PRN Tylenol  #COPD No dyspnea or wheezing on exam. PRN albuterol. Takes qvar daily  #history of Chronic UTIs/interstitial cystitis Daughter reports chronic history for which she takes chronic Bactrim UA and urine culture here are unremarkable, continue home medications  #GERD Continue PPI  #H/o of AAA repair (stent in 2014) Evaluated with duplex ultrasound on 11/2018 found to have diameter of 3.7 cm, unchanged from prior.  Will continue outpatient follow-up 18 months with vascular       DVT prophylaxis: Consultants: Neurology  Procedures:  none     Code Status: DNR   Family Communication: Daughter update at bedside   Disposition Plan: PT/OT evaluation recommends skilled nursing facility placement for continued rehabilitation, medical work-up currently includes repeating orthostatic vitals, ensuring stable blood pressure, ensuring able to participate in rehab without drops in blood pressure, management of neck pain.  And final neurology recommendations        Objective: Vitals:   12/25/18 0700 12/25/18 0900 12/25/18 1100 12/25/18 1200  BP: 124/62 (!) 115/57 (!) 142/74 (!) 142/74  Pulse: 77  77 88  Resp: 20  16 18   Temp:   98.2 F (36.8 C) 97.9 F (36.6 C)  TempSrc:   Oral Oral  SpO2:       No intake or output data in the 24 hours ending 12/25/18 1240 There were no vitals filed for this visit.  Exam:  Constitutional:normal appearing elderly female, no distress Eyes: EOMI, anicteric, normal conjunctivae ENMT: Oropharynx with moist mucous membranes Neck: FROM Cardiovascular: RRR no MRGs, with no peripheral edema Respiratory: Normal respiratory effort, clear breath sounds MSK: Some paraspinal tenderness in cervical region,  full range of motion of neck Abdomen: Soft,non-tender, normal bowel sounds Skin: No rash ulcers, or lesions. Without skin tenting  Neurologic: Grossly no focal neuro  deficit. Psychiatric:Appropriate affect, and mood. Mental status alert, oriented to self, place (month: Hospital) and able to name daughter.  Not oriented to context or time  Data Reviewed: CBC: Recent Labs  Lab 12/23/18 0957 12/23/18 1007  WBC 7.7  --   NEUTROABS 6.2  --   HGB 13.5 14.6  HCT 44.0 43.0  MCV 90.7  --   PLT 298  --    Basic Metabolic Panel: Recent Labs  Lab 12/23/18 0957 12/23/18 1007 12/24/18 0549  NA 138 140 139  K 4.0 4.1 3.5  CL 104 103 108  CO2 26  --  23  GLUCOSE 114* 112* 90  BUN 8 9 7*  CREATININE 0.90 0.90 0.90  CALCIUM 9.2  --  8.7*   GFR: CrCl cannot be calculated (Unknown ideal weight.). Liver Function Tests: Recent Labs  Lab 12/23/18 0957  AST 18  ALT 13  ALKPHOS 54  BILITOT 0.6  PROT 6.8  ALBUMIN 3.6   No results for input(s): LIPASE, AMYLASE in the last 168 hours. No results for input(s): AMMONIA in the last 168 hours. Coagulation Profile: Recent Labs  Lab 12/23/18 0957  INR 0.97   Cardiac Enzymes: Recent Labs  Lab 12/23/18 2152 12/24/18 0549  TROPONINI <0.03 <0.03   BNP (last 3 results) No results for input(s): PROBNP in the last 8760 hours. HbA1C: No results for input(s): HGBA1C in the last 72 hours. CBG: Recent Labs  Lab 12/24/18 1533  GLUCAP 88   Lipid Profile: No results for input(s): CHOL, HDL, LDLCALC, TRIG, CHOLHDL, LDLDIRECT in the last 72 hours. Thyroid Function Tests: No results for input(s): TSH, T4TOTAL, FREET4, T3FREE, THYROIDAB in the last 72 hours. Anemia Panel: No results for input(s): VITAMINB12, FOLATE, FERRITIN, TIBC, IRON, RETICCTPCT in the last 72 hours. Urine analysis:    Component Value Date/Time   COLORURINE STRAW (A) 12/23/2018 0943   APPEARANCEUR CLEAR 12/23/2018 0943   LABSPEC 1.008 12/23/2018 0943   PHURINE 8.0 12/23/2018 0943   GLUCOSEU NEGATIVE 12/23/2018 0943   HGBUR MODERATE (A) 12/23/2018 0943   HGBUR small 07/28/2009 1142   BILIRUBINUR NEGATIVE 12/23/2018 0943    BILIRUBINUR negative 08/20/2018 1058   KETONESUR NEGATIVE 12/23/2018 0943   PROTEINUR NEGATIVE 12/23/2018 0943   UROBILINOGEN 0.2 08/20/2018 1058   UROBILINOGEN 0.2 01/11/2015 1128   NITRITE NEGATIVE 12/23/2018 0943   LEUKOCYTESUR NEGATIVE 12/23/2018 0943   Sepsis Labs: @LABRCNTIP (procalcitonin:4,lacticidven:4)  )No results found for this or any previous visit (from the past 240 hour(s)).    Studies: Ct Head Code Stroke Wo Contrast  Result Date: 12/24/2018 CLINICAL DATA:  Code stroke.  Slurred speech. EXAM: CT HEAD WITHOUT CONTRAST TECHNIQUE: Contiguous axial images were obtained from the base of the skull through the vertex without intravenous contrast. COMPARISON:  MR brain 12/23/2018. CTA head neck 12/23/2018 FINDINGS: Brain: No evidence of acute infarction, hemorrhage, hydrocephalus, extra-axial collection or mass lesion/mass effect. Remote RIGHT cerebellar infarcts. Advanced atrophy. Hypoattenuation of white matter, likely small vessel disease. Vascular: Calcification of the cavernous internal carotid arteries and distal vertebral arteries consistent with cerebrovascular atherosclerotic disease. No signs of intracranial large vessel occlusion. Skull: Normal. Negative for fracture or focal lesion. Sinuses/Orbits: No acute finding. Other: None ASPECTS ([place] Stroke Program Early CT Score) - Ganglionic level infarction (caudate, lentiform nuclei, internal capsule, insula, M1-M3 cortex): 7 - Supraganglionic infarction (M4-M6 cortex):  3 Total score (0-10 with 10 being normal): 10 IMPRESSION: 1. Atrophy and small vessel disease. No acute intracranial findings. 2. ASPECTS is 10. 3. These results were communicated to Dr. Lorraine Lax at 3:54 pmon 1/8/2020by text page via the [facility] messaging system. * Electronically Signed   By: Staci Righter M.D.   On: 12/24/2018 15:55    Scheduled Meds: . acidophilus  1 capsule Oral Daily  . amLODipine  2.5 mg Oral Daily  . clopidogrel  75 mg Oral Daily  . docusate  sodium  100 mg Oral QPM  . fluticasone  2 spray Each Nare Daily  . heparin  5,000 Units Subcutaneous Q8H  . latanoprost  1 drop Both Eyes QHS  . loratadine  10 mg Oral Daily  . pantoprazole  40 mg Oral Daily  . sodium chloride flush  3 mL Intravenous Q12H  . traMADol  25 mg Oral Daily  . traMADol  50 mg Oral QHS    Continuous Infusions: . sodium chloride       LOS: 2 days     Desiree Hane, MD [facility] Pager [phone]  If 7PM-7AM, please contact night-coverage [email or web address] Password [email or web address] 12/25/2018, 12:40 PM

## 2018-12-25 NOTE — Plan of Care (Signed)
  Problem: Education: Goal: Knowledge of General Education information will improve Description Including pain rating scale, medication(s)/side effects and non-pharmacologic comfort measures Outcome: Progressing   Problem: Health Behavior/Discharge Planning: Goal: Ability to manage health-related needs will improve Outcome: Progressing   Problem: Clinical Measurements: Goal: Ability to maintain clinical measurements within normal limits will improve Outcome: Progressing Goal: Will remain free from infection Outcome: Progressing Goal: Diagnostic test results will improve Outcome: Progressing Goal: Respiratory complications will improve Outcome: Progressing Goal: Cardiovascular complication will be avoided Outcome: Progressing   Problem: Activity: Goal: Risk for activity intolerance will decrease Outcome: Progressing   Problem: Nutrition: Goal: Adequate nutrition will be maintained Outcome: Progressing   Problem: Coping: Goal: Level of anxiety will decrease Outcome: Progressing   Problem: Elimination: Goal: Will not experience complications related to bowel motility Outcome: Progressing Goal: Will not experience complications related to urinary retention Outcome: Progressing   Problem: Pain Managment: Goal: General experience of comfort will improve Outcome: Progressing   Problem: Safety: Goal: Ability to remain free from injury will improve Outcome: Progressing   Problem: Skin Integrity: Goal: Risk for impaired skin integrity will decrease Outcome: Progressing   Problem: Education: Goal: Knowledge of disease or condition will improve Outcome: Progressing Goal: Knowledge of secondary prevention will improve Outcome: Progressing Goal: Knowledge of patient specific risk factors addressed and post discharge goals established will improve Outcome: Progressing Goal: Individualized Educational Video(s) Outcome: Progressing   Problem: Self-Care: Goal:  Verbalization of feelings and concerns over difficulty with self-care will improve Outcome: Progressing Goal: Ability to communicate needs accurately will improve Outcome: Progressing

## 2018-12-26 DIAGNOSIS — R41841 Cognitive communication deficit: Secondary | ICD-10-CM | POA: Diagnosis not present

## 2018-12-26 DIAGNOSIS — I2510 Atherosclerotic heart disease of native coronary artery without angina pectoris: Secondary | ICD-10-CM | POA: Diagnosis not present

## 2018-12-26 DIAGNOSIS — I739 Peripheral vascular disease, unspecified: Secondary | ICD-10-CM | POA: Diagnosis not present

## 2018-12-26 DIAGNOSIS — M6281 Muscle weakness (generalized): Secondary | ICD-10-CM | POA: Diagnosis not present

## 2018-12-26 DIAGNOSIS — R262 Difficulty in walking, not elsewhere classified: Secondary | ICD-10-CM | POA: Diagnosis not present

## 2018-12-26 DIAGNOSIS — M2550 Pain in unspecified joint: Secondary | ICD-10-CM | POA: Diagnosis not present

## 2018-12-26 DIAGNOSIS — R2689 Other abnormalities of gait and mobility: Secondary | ICD-10-CM | POA: Diagnosis not present

## 2018-12-26 DIAGNOSIS — J309 Allergic rhinitis, unspecified: Secondary | ICD-10-CM | POA: Diagnosis not present

## 2018-12-26 DIAGNOSIS — R4189 Other symptoms and signs involving cognitive functions and awareness: Secondary | ICD-10-CM | POA: Diagnosis not present

## 2018-12-26 DIAGNOSIS — Z87440 Personal history of urinary (tract) infections: Secondary | ICD-10-CM | POA: Diagnosis not present

## 2018-12-26 DIAGNOSIS — M1712 Unilateral primary osteoarthritis, left knee: Secondary | ICD-10-CM | POA: Diagnosis not present

## 2018-12-26 DIAGNOSIS — J45909 Unspecified asthma, uncomplicated: Secondary | ICD-10-CM | POA: Diagnosis not present

## 2018-12-26 DIAGNOSIS — R278 Other lack of coordination: Secondary | ICD-10-CM | POA: Diagnosis not present

## 2018-12-26 DIAGNOSIS — S32010D Wedge compression fracture of first lumbar vertebra, subsequent encounter for fracture with routine healing: Secondary | ICD-10-CM | POA: Diagnosis not present

## 2018-12-26 DIAGNOSIS — H409 Unspecified glaucoma: Secondary | ICD-10-CM | POA: Diagnosis not present

## 2018-12-26 DIAGNOSIS — R2681 Unsteadiness on feet: Secondary | ICD-10-CM | POA: Diagnosis not present

## 2018-12-26 DIAGNOSIS — Z8673 Personal history of transient ischemic attack (TIA), and cerebral infarction without residual deficits: Secondary | ICD-10-CM | POA: Diagnosis not present

## 2018-12-26 DIAGNOSIS — E785 Hyperlipidemia, unspecified: Secondary | ICD-10-CM | POA: Diagnosis not present

## 2018-12-26 DIAGNOSIS — H04123 Dry eye syndrome of bilateral lacrimal glands: Secondary | ICD-10-CM | POA: Diagnosis not present

## 2018-12-26 DIAGNOSIS — Z95828 Presence of other vascular implants and grafts: Secondary | ICD-10-CM | POA: Diagnosis not present

## 2018-12-26 DIAGNOSIS — R410 Disorientation, unspecified: Secondary | ICD-10-CM | POA: Diagnosis not present

## 2018-12-26 DIAGNOSIS — K5900 Constipation, unspecified: Secondary | ICD-10-CM | POA: Diagnosis not present

## 2018-12-26 DIAGNOSIS — K589 Irritable bowel syndrome without diarrhea: Secondary | ICD-10-CM | POA: Diagnosis not present

## 2018-12-26 DIAGNOSIS — Z7401 Bed confinement status: Secondary | ICD-10-CM | POA: Diagnosis not present

## 2018-12-26 DIAGNOSIS — I10 Essential (primary) hypertension: Secondary | ICD-10-CM | POA: Diagnosis not present

## 2018-12-26 DIAGNOSIS — M8580 Other specified disorders of bone density and structure, unspecified site: Secondary | ICD-10-CM | POA: Diagnosis not present

## 2018-12-26 DIAGNOSIS — F339 Major depressive disorder, recurrent, unspecified: Secondary | ICD-10-CM | POA: Diagnosis not present

## 2018-12-26 DIAGNOSIS — R1310 Dysphagia, unspecified: Secondary | ICD-10-CM | POA: Diagnosis not present

## 2018-12-26 DIAGNOSIS — I160 Hypertensive urgency: Secondary | ICD-10-CM | POA: Diagnosis not present

## 2018-12-26 DIAGNOSIS — R55 Syncope and collapse: Secondary | ICD-10-CM | POA: Diagnosis not present

## 2018-12-26 DIAGNOSIS — M542 Cervicalgia: Secondary | ICD-10-CM | POA: Diagnosis not present

## 2018-12-26 DIAGNOSIS — M1990 Unspecified osteoarthritis, unspecified site: Secondary | ICD-10-CM | POA: Diagnosis not present

## 2018-12-26 DIAGNOSIS — I951 Orthostatic hypotension: Secondary | ICD-10-CM | POA: Diagnosis not present

## 2018-12-26 LAB — CBC
HCT: 39.4 % (ref 36.0–46.0)
Hemoglobin: 12.3 g/dL (ref 12.0–15.0)
MCH: 28 pg (ref 26.0–34.0)
MCHC: 31.2 g/dL (ref 30.0–36.0)
MCV: 89.7 fL (ref 80.0–100.0)
Platelets: 229 10*3/uL (ref 150–400)
RBC: 4.39 MIL/uL (ref 3.87–5.11)
RDW: 13.2 % (ref 11.5–15.5)
WBC: 5.8 10*3/uL (ref 4.0–10.5)
nRBC: 0 % (ref 0.0–0.2)

## 2018-12-26 LAB — BASIC METABOLIC PANEL
Anion gap: 8 (ref 5–15)
BUN: 13 mg/dL (ref 8–23)
CO2: 25 mmol/L (ref 22–32)
Calcium: 8.6 mg/dL — ABNORMAL LOW (ref 8.9–10.3)
Chloride: 105 mmol/L (ref 98–111)
Creatinine, Ser: 1.02 mg/dL — ABNORMAL HIGH (ref 0.44–1.00)
GFR calc Af Amer: 54 mL/min — ABNORMAL LOW (ref >60)
GFR calc non Af Amer: 47 mL/min — ABNORMAL LOW (ref >60)
Glucose, Bld: 109 mg/dL — ABNORMAL HIGH (ref 70–99)
Potassium: 3.4 mmol/L — ABNORMAL LOW (ref 3.5–5.1)
SODIUM: 138 mmol/L (ref 135–145)

## 2018-12-26 LAB — LIPID PANEL
Cholesterol: 218 mg/dL — ABNORMAL HIGH (ref 0–200)
HDL: 40 mg/dL — ABNORMAL LOW (ref >40)
LDL CALC: 149 mg/dL — AB (ref 0–99)
TRIGLYCERIDES: 147 mg/dL (ref <150)
Total CHOL/HDL Ratio: 5.5 RATIO
VLDL: 29 mg/dL (ref 0–40)

## 2018-12-26 LAB — HEMOGLOBIN A1C
Hgb A1c MFr Bld: 5.6 % (ref 4.8–5.6)
Mean Plasma Glucose: 114.02 mg/dL

## 2018-12-26 MED ORDER — TRIMETHOPRIM 100 MG PO TABS: 100.0000 mg | ORAL_TABLET | Freq: Every day | ORAL | 0 refills | Status: AC

## 2018-12-26 MED ORDER — AMLODIPINE BESYLATE 2.5 MG PO TABS: 2.5000 mg | ORAL_TABLET | Freq: Every day | ORAL | Status: DC

## 2018-12-26 MED ORDER — CETIRIZINE HCL 10 MG PO TABS: ORAL_TABLET | ORAL | 5 refills | Status: DC

## 2018-12-26 MED ORDER — POLYETHYLENE GLYCOL 3350 17 G PO PACK: 17.0000 g | PACK | Freq: Every day | ORAL | 0 refills | Status: AC | PRN

## 2018-12-26 MED ORDER — FLUTICASONE PROPIONATE 50 MCG/ACT NA SUSP: 2.0000 | Freq: Every day | NASAL | 11 refills | Status: AC

## 2018-12-26 MED ORDER — ALIGN 4 MG PO CAPS: 4.0000 mg | ORAL_CAPSULE | Freq: Every day | ORAL | Status: AC

## 2018-12-26 MED ORDER — DOCUSATE SODIUM 100 MG PO CAPS: 100.0000 mg | ORAL_CAPSULE | Freq: Every evening | ORAL | 0 refills | Status: AC

## 2018-12-26 MED ORDER — CLOPIDOGREL BISULFATE 75 MG PO TABS: 75.0000 mg | ORAL_TABLET | Freq: Every day | ORAL | 11 refills | Status: DC

## 2018-12-26 MED ORDER — TRAMADOL HCL 50 MG PO TABS: ORAL_TABLET | ORAL | 0 refills | Status: DC

## 2018-12-26 MED ORDER — ONDANSETRON 4 MG PO TBDP: 4.0000 mg | ORAL_TABLET | Freq: Three times a day (TID) | ORAL | 1 refills | Status: DC | PRN

## 2018-12-26 MED ORDER — ALBUTEROL SULFATE HFA 108 (90 BASE) MCG/ACT IN AERS: 2.0000 | INHALATION_SPRAY | RESPIRATORY_TRACT | 2 refills | Status: DC | PRN

## 2018-12-26 MED ORDER — ACETAMINOPHEN 500 MG PO TABS: 500.0000 mg | ORAL_TABLET | ORAL | 0 refills | Status: AC | PRN

## 2018-12-26 NOTE — Discharge Summary (Signed)
Discharge Summary  Ashlee Reyes LKG:[id number] DOB: 03/01/1921  PCP: Ashlee Greenspan, MD  Admit date: 12/23/2018 Discharge date: 12/26/2018   Time spent: < 25 minutes  Admitted From: home Disposition:  SNF  Recommendations for Outpatient Follow-up:  1. Follow up with SNF PCP in 1 week (BP check) 2. New medications: Amlodipine 2.5 mg (will need BP check by PCP SNF), prescription provided for tramadol as needed (home medication)    Discharge Diagnoses:  Active Hospital Problems   Diagnosis Date Noted  . Hypertensive urgency 12/24/2018  . Orthostatic hypotension 01/11/2015  . Syncope 10/17/2013    Resolved Hospital Problems  No resolved problems to display.    Discharge Condition: Stable  CODE STATUS: DNR  History of present illness:  Ashlee Reyes is a 85 y.o. year old female with medical history significant for AAA (repair 2014), Plavix therapy for prior CVA, asthma who presented on 12/23/2018 with dizziness and lightheadedness while sitting and was found to have hypertensive urgency and orthostatic hypotension. Remaining hospital course addressed in problem based format below:   Hospital Course:  Orthostatic syncope, resolved Upon initial arrival, her syncope was worked up with both CT head and MRI brain and CT angios head and neck which were both negative.  TTE showed preserved EF with no wall motion abnormalities on telemetry she had no abnormal rhythms. She was found to be quite orthostatic upon arrival which improved with supportive care.  Patient did not need compression stockings or other medications to maintain normal blood pressure with changes in position.  Orthostatic vitals were obtained on day of discharge and remained within normal limits.  Hypertensive urgency, resolved Hypertension, new diagnosis Upon arrival blood pressure was quite elevated with systolics in the 664Q.  Given her concomitant orthostatic syncope was started on low-dose amlodipine  2.5 mg with great control of blood pressure.  Will continue on discharge.  Will need BP check at SNF PCP.  Acute confusion, most likely delirium in setting of mild cognitive impairment  During hospital stay patient did have an abrupt change in mental status with increased lethargy and decreased fussiness.  Code stroke was called, CT head at that time showed no acute intracranial abnormalities.  Neurology did not think this was consistent with a TIA more likely related to transient delirium in a patient with mild cognitive impairment.  Since then patient has been able to maintain her mental status baseline which is her being alert to self, place only which is corroborated by her daughter  Chronic neck pain Ongoing for past 3 months.  Maintain good pain control with her home Tylenol and PRN tramadol.  Did not have any neurological deficits during hospitalization.  History of nonhemorrhagic CVA CT imaging does show mild ischemic changes consistent with chronic infarcts (patient has been on Plavix since 2016 for symptoms of slurred speech.  She will continue on discharge.  Hyperlipidemia, stable  GERD continue PPI  History of chronic UTI/interstitial cystitis UA here showed no acute UTI Family would like to continue chronic Bactrim Did have brief episode of urinary incontinence while here which resolved with intermittent in and out catheterizations as she was mildly retaining urine.  Upon discharge patient was able to empty her bladder on her own without any catheterization.  History of AAA repair (status post stent 2014) Vitals duplex ultrasound on 11/2018, diameter at that time 3.7 cm.  Unchanged from prior.  Outpatient follow-up in 18 months with vascular.    Consultations:  Neurology  Procedures/Studies: TTE, 12/24/2018 Study Conclusions  - Left ventricle: The cavity size was normal. Wall thickness was   increased in a pattern of mild LVH. Systolic function was   vigorous. The  estimated ejection fraction was in the range of 65%   to 70%. Wall motion was normal; there were no regional wall   motion abnormalities. Doppler parameters are consistent with   abnormal left ventricular relaxation (grade 1 diastolic   dysfunction). - Aortic valve: There was no stenosis. There was trivial   regurgitation. - Mitral valve: There was trivial regurgitation. - Right ventricle: The cavity size was normal. Systolic function   was normal. - Pulmonary arteries: No complete TR doppler jet so unable to   estimate PA systolic pressure. - Inferior vena cava: The vessel was normal in size. The   respirophasic diameter changes were in the normal range (>= 50%),   consistent with normal central venous pressure. - Pericardium, extracardiac: A trivial pericardial effusion was   identified.  Impressions:  - Normal LV size with mild LV hypertrophy. EF 65-70%. Normal RV   size and systolic function. No significant valvular   abnormalities.   Discharge Exam: BP 126/66 (BP Location: Left Arm)   Pulse 84   Temp 97.8 F (36.6 C) (Oral)   Resp 16   LMP 12/17/1968   SpO2 98%   General: Lying in bed, no apparent distress Eyes: EOMI, anicteric ENT: Oral Mucosa clear and moist Cardiovascular: regular rate and rhythm, no murmurs, rubs or gallops, no edema, Respiratory: Normal respiratory effort, lungs clear to auscultation bilaterally Abdomen: soft, non-distended, non-tender, normal bowel sounds Skin: No Rash Neurologic: Grossly no focal neuro deficit.Mental status alert, oriented to self, context, place.  Not to time, speech normal, Psychiatric:Appropriate affect, and mood   Discharge Instructions You were cared for by a hospitalist during your hospital stay. If you have any questions about your discharge medications or the care you received while you were in the hospital after you are discharged, you can call the unit and asked to speak with the hospitalist on call if the  hospitalist that took care of you is not available. Once you are discharged, your primary care physician will handle any further medical issues. Please note that NO REFILLS for any discharge medications will be authorized once you are discharged, as it is imperative that you return to your primary care physician (or establish a relationship with a primary care physician if you do not have one) for your aftercare needs so that they can reassess your need for medications and monitor your lab values.  Discharge Instructions    Diet - low sodium heart healthy   Complete by:  As directed    Increase activity slowly   Complete by:  As directed      Allergies as of 12/26/2018      Reactions   Amoxicillin-pot Clavulanate Nausea And Vomiting   Has patient had a PCN reaction causing immediate rash, facial/tongue/throat swelling, SOB or lightheadedness with hypotension: No Has patient had a PCN reaction causing severe rash involving mucus membranes or skin necrosis: No Has patient had a PCN reaction that required hospitalization: No Has patient had a PCN reaction occurring within the last 10 years: No If all of the above answers are "NO", then may proceed with Cephalosporin use.   Avelox [moxifloxacin] Nausea And Vomiting   Flovent Hfa [fluticasone] Other (See Comments)   "makes her feel funny" per family member   Ibuprofen Other (See Comments)  GI upset   Keflex [cephalexin] Other (See Comments)   abd pain / GI upset   Nitrofurantoin Other (See Comments)   Chest pain or indigestion with nausea   Omeprazole Nausea Only   Prevnar [pneumococcal 13-val Conj Vacc] Itching   Severe local reaction with redness and itching    Rosuvastatin Other (See Comments)   Foot pain   Sertraline Hcl Other (See Comments)   Made her more depressed   Tetanus Toxoids    Local redness and swelling       Medication List    STOP taking these medications   benzonatate 100 MG capsule Commonly known as:  TESSALON    fluticasone 44 MCG/ACT inhaler Commonly known as:  FLOVENT HFA   Fluticasone Furoate 200 MCG/ACT Aepb Commonly known as:  ARNUITY ELLIPTA     TAKE these medications   acetaminophen 500 MG tablet Commonly known as:  TYLENOL Take 1 tablet (500 mg total) by mouth every 4 (four) hours as needed for mild pain.   albuterol 108 (90 Base) MCG/ACT inhaler Commonly known as:  PROAIR HFA Inhale 2 puffs into the lungs every 4 (four) hours as needed for wheezing.   ALIGN 4 MG Caps Take 1 capsule (4 mg total) by mouth daily.   amLODipine 2.5 MG tablet Commonly known as:  NORVASC Take 1 tablet (2.5 mg total) by mouth daily. Start taking on:  December 27, 2018   beclomethasone 80 MCG/ACT inhaler Commonly known as:  QVAR REDIHALER Inhale 2 puffs into the lungs 2 (two) times daily.   cetirizine 10 MG tablet Commonly known as:  ZYRTEC TAKE 1 TABLET BY MOUTH DAILY AS NEEDED FOR ALLERGIES What changed:    how much to take  how to take this  when to take this  additional instructions   clopidogrel 75 MG tablet Commonly known as:  PLAVIX Take 1 tablet (75 mg total) by mouth daily.   docusate sodium 100 MG capsule Commonly known as:  COLACE Take 1 capsule (100 mg total) by mouth every evening.   DRAMAMINE PO Take 1 tablet by mouth as needed (dizziness).   fluticasone 50 MCG/ACT nasal spray Commonly known as:  FLONASE Place 2 sprays into both nostrils daily.   LUMIGAN 0.01 % Soln Generic drug:  bimatoprost Place 1 drop into both eyes at bedtime.   NEXIUM 40 MG capsule Generic drug:  esomeprazole TAKE ONE (1) CAPSULE BY MOUTH 2 TIMES DAILY BEFORE A MEAL What changed:    how much to take  how to take this  when to take this  additional instructions   ondansetron 4 MG disintegrating tablet Commonly known as:  ZOFRAN ODT Take 1 tablet (4 mg total) by mouth every 8 (eight) hours as needed for nausea or vomiting.   polyethylene glycol packet Commonly known as:  MIRALAX  / GLYCOLAX Take 17 g by mouth daily as needed for mild constipation.   SYSTANE OP Place 1 drop into both eyes every 8 (eight) hours as needed (dry eyes).   traMADol 50 MG tablet Commonly known as:  ULTRAM Take 1/2 pill by mouth each am and 1 tab QHS What changed:    how much to take  how to take this  when to take this   trimethoprim 100 MG tablet Commonly known as:  TRIMPEX Take 1 tablet (100 mg total) by mouth daily with lunch.      Allergies  Allergen Reactions  . Amoxicillin-Pot Clavulanate Nausea And Vomiting    Has  patient had a PCN reaction causing immediate rash, facial/tongue/throat swelling, SOB or lightheadedness with hypotension: No Has patient had a PCN reaction causing severe rash involving mucus membranes or skin necrosis: No Has patient had a PCN reaction that required hospitalization: No Has patient had a PCN reaction occurring within the last 10 years: No If all of the above answers are "NO", then may proceed with Cephalosporin use.   . Avelox [Moxifloxacin] Nausea And Vomiting  . Flovent Hfa [Fluticasone] Other (See Comments)    "makes her feel funny" per family member  . Ibuprofen Other (See Comments)    GI upset  . Keflex [Cephalexin] Other (See Comments)    abd pain / GI upset  . Nitrofurantoin Other (See Comments)    Chest pain or indigestion with nausea  . Omeprazole Nausea Only  . Prevnar [Pneumococcal 13-Val Conj Vacc] Itching    Severe local reaction with redness and itching   . Rosuvastatin Other (See Comments)    Foot pain  . Sertraline Hcl Other (See Comments)    Made her more depressed  . Tetanus Toxoids     Local redness and swelling       The results of significant diagnostics from this hospitalization (including imaging, microbiology, ancillary and laboratory) are listed below for reference.    Significant Diagnostic Studies: Ct Angio Head W Or Wo Contrast  Result Date: 12/23/2018 CLINICAL DATA:  Focal neuro deficit greater  than 6 hours. Suspect stroke. Aphasia, syncope EXAM: CT ANGIOGRAPHY HEAD AND NECK TECHNIQUE: Multidetector CT imaging of the head and neck was performed using the standard protocol during bolus administration of intravenous contrast. Multiplanar CT image reconstructions and MIPs were obtained to evaluate the vascular anatomy. Carotid stenosis measurements (when applicable) are obtained utilizing NASCET criteria, using the distal internal carotid diameter as the denominator. CONTRAST:  13mL ISOVUE-370 IOPAMIDOL (ISOVUE-370) INJECTION 76% COMPARISON:  CT head 12/23/2018 FINDINGS: CTA NECK FINDINGS Aortic arch: Atherosclerotic aortic arch. Atherosclerotic disease and mild stenosis in the subclavian artery bilaterally. Right carotid system: Mild atherosclerotic calcification right carotid bifurcation without significant stenosis. Left carotid system: Mild stenosis distal left common carotid artery. Mild atherosclerotic disease left internal carotid artery without stenosis. Vertebral arteries: Both vertebral arteries widely patent in the neck. Skeleton: No acute skeletal abnormality. Other neck: Negative for mass or adenopathy Upper chest: 2 cm sub solid density right upper lobe. Follow-up recommended. Probable scarring in the right upper lobe anteriorly and in the left upper lobe. Review of the MIP images confirms the above findings CTA HEAD FINDINGS Anterior circulation: Atherosclerotic calcification and mild stenosis in the cavernous carotid bilaterally. Anterior and middle cerebral arteries patent bilaterally without significant stenosis or occlusion Posterior circulation: Mild stenosis distal vertebral artery bilaterally. Basilar widely patent. Right PICA patent. Left PICA not visualized. Superior cerebellar and posterior cerebral arteries patent bilaterally without stenosis. Venous sinuses: Patent Anatomic variants: None Delayed phase: Normal enhancement on delayed imaging. Review of the MIP images confirms the  above findings IMPRESSION: 1. Negative for emergent large vessel occlusion 2. Mild stenosis distal left common carotid artery. Carotid bifurcation mildly disease bilaterally without significant stenosis. Mild atherosclerotic stenosis in the cavernous carotid bilaterally. 3. Mild stenosis distal vertebral artery bilaterally. 4. 2 cm sub solid density right upper lobe. Recommend CT chest at this time for baseline evaluation. This could be due to scarring or neoplasm. Electronically Signed   By: Franchot Gallo M.D.   On: 12/23/2018 13:25   Dg Chest 2 View  Result Date: 12/23/2018  CLINICAL DATA:  Syncope. EXAM: CHEST - 2 VIEW COMPARISON:  Chest x-ray dated January 01, 2018. FINDINGS: Stable mild cardiomegaly. Normal pulmonary vascularity. Atherosclerotic calcification of the aortic arch. No focal consolidation, pleural effusion, or pneumothorax. No acute osseous abnormality. Chronic L1 compression deformity. IMPRESSION: No active cardiopulmonary disease. Electronically Signed   By: Titus Dubin M.D.   On: 12/23/2018 13:31   Ct Head Wo Contrast  Result Date: 12/23/2018 CLINICAL DATA:  Ataxia with stroke suspected.  Syncope. EXAM: CT HEAD WITHOUT CONTRAST TECHNIQUE: Contiguous axial images were obtained from the base of the skull through the vertex without intravenous contrast. COMPARISON:  02/20/2018 FINDINGS: Brain: No evidence of acute infarction, hemorrhage, hydrocephalus, extra-axial collection or mass lesion/mass effect. Small remote right cerebellar infarct. Mild for age cerebral volume loss. Stable pattern of chronic small vessel ischemia in the deep cerebral white matter. Vascular: Atherosclerotic calcification Skull: No acute finding Sinuses/Orbits: Bilateral cataract resection.  No acute finding. IMPRESSION: No acute finding or change from prior. Electronically Signed   By: Monte Fantasia M.D.   On: 12/23/2018 10:49   Ct Angio Neck W And/or Wo Contrast  Result Date: 12/23/2018 CLINICAL DATA:  Focal  neuro deficit greater than 6 hours. Suspect stroke. Aphasia, syncope EXAM: CT ANGIOGRAPHY HEAD AND NECK TECHNIQUE: Multidetector CT imaging of the head and neck was performed using the standard protocol during bolus administration of intravenous contrast. Multiplanar CT image reconstructions and MIPs were obtained to evaluate the vascular anatomy. Carotid stenosis measurements (when applicable) are obtained utilizing NASCET criteria, using the distal internal carotid diameter as the denominator. CONTRAST:  131mL ISOVUE-370 IOPAMIDOL (ISOVUE-370) INJECTION 76% COMPARISON:  CT head 12/23/2018 FINDINGS: CTA NECK FINDINGS Aortic arch: Atherosclerotic aortic arch. Atherosclerotic disease and mild stenosis in the subclavian artery bilaterally. Right carotid system: Mild atherosclerotic calcification right carotid bifurcation without significant stenosis. Left carotid system: Mild stenosis distal left common carotid artery. Mild atherosclerotic disease left internal carotid artery without stenosis. Vertebral arteries: Both vertebral arteries widely patent in the neck. Skeleton: No acute skeletal abnormality. Other neck: Negative for mass or adenopathy Upper chest: 2 cm sub solid density right upper lobe. Follow-up recommended. Probable scarring in the right upper lobe anteriorly and in the left upper lobe. Review of the MIP images confirms the above findings CTA HEAD FINDINGS Anterior circulation: Atherosclerotic calcification and mild stenosis in the cavernous carotid bilaterally. Anterior and middle cerebral arteries patent bilaterally without significant stenosis or occlusion Posterior circulation: Mild stenosis distal vertebral artery bilaterally. Basilar widely patent. Right PICA patent. Left PICA not visualized. Superior cerebellar and posterior cerebral arteries patent bilaterally without stenosis. Venous sinuses: Patent Anatomic variants: None Delayed phase: Normal enhancement on delayed imaging. Review of the MIP  images confirms the above findings IMPRESSION: 1. Negative for emergent large vessel occlusion 2. Mild stenosis distal left common carotid artery. Carotid bifurcation mildly disease bilaterally without significant stenosis. Mild atherosclerotic stenosis in the cavernous carotid bilaterally. 3. Mild stenosis distal vertebral artery bilaterally. 4. 2 cm sub solid density right upper lobe. Recommend CT chest at this time for baseline evaluation. This could be due to scarring or neoplasm. Electronically Signed   By: Franchot Gallo M.D.   On: 12/23/2018 13:25   Mr Brain Wo Contrast  Result Date: 12/23/2018 CLINICAL DATA:  Recurrent syncope EXAM: MRI HEAD WITHOUT CONTRAST TECHNIQUE: Multiplanar, multiecho pulse sequences of the brain and surrounding structures were obtained without intravenous contrast. COMPARISON:  Head CT 12/23/2018 FINDINGS: The examination had to be discontinued prior to completion  due to the patient's back pain and inability to remain in the scanner. Axial and coronal DWI, axial T2, axial FLAIR and axial GRE sequences were obtained. BRAIN: There is no acute infarct, acute hemorrhage, hydrocephalus or extra-axial collection. The midline structures are normal. No midline shift or other mass effect. There are old bilateral cerebellar infarcts and old bilateral basal ganglia lacunar infarcts. Early confluent hyperintense T2-weighted signal of the periventricular and deep white matter, most commonly due to chronic ischemic microangiopathy. Generalized atrophy without lobar predilection. Susceptibility-sensitive sequences show no chronic microhemorrhage or superficial siderosis. VASCULAR: Major intracranial arterial and venous sinus flow voids are normal. SKULL AND UPPER CERVICAL SPINE: Calvarial bone marrow signal is normal. There is no skull base mass. Visualized upper cervical spine and soft tissues are normal. SINUSES/ORBITS: No fluid levels or advanced mucosal thickening. No mastoid or middle ear  effusion. The orbits are normal. IMPRESSION: 1. Truncated examination. 2. No acute intracranial abnormality. 3. 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 Electronically Signed   By: Ulyses Jarred M.D.   On: 12/23/2018 19:51   Vas [place] Evar Duplex  Result Date: 12/04/2018 ABDOMINAL AORTA STUDY Indications: Follow up exam for EVAR. Surgery date 07/01/2013. Limitations: Patient discomfort and air/bowel gas.  Performing Technologist: Ronal Fear RVS, RCS  Examination Guidelines: A complete evaluation includes B-mode imaging, spectral Doppler, color Doppler, and power Doppler as needed of all accessible portions of each vessel. Bilateral testing is considered an integral part of a complete examination. Limited examinations for reoccurring indications may be performed as noted.  Endovascular Aortic Repair (EVAR): +----------+----------------+-------------------+-------------------+           Diameter AP (cm)Diameter Trans (cm)Velocities (cm/sec) +----------+----------------+-------------------+-------------------+ Aorta     3.72            3.74               66                  +----------+----------------+-------------------+-------------------+ Right Limb                                                       +----------+----------------+-------------------+-------------------+ Left Limb                                                        +----------+----------------+-------------------+-------------------+  Summary: Abdominal Aorta: Patent endovascular aneurysm repair with no evidence of endoleak where visualized. Technically difficult exam due to patient intolerance to probe pressure and excessive bowel gas.  *See table(s) above for measurements and observations.  Electronically signed by Ruta Hinds MD on 12/04/2018 at 1:45:56 PM.   Final    Ct Head Code Stroke Wo Contrast  Result Date: 12/24/2018 CLINICAL DATA:  Code stroke.  Slurred speech. EXAM: CT HEAD  WITHOUT CONTRAST TECHNIQUE: Contiguous axial images were obtained from the base of the skull through the vertex without intravenous contrast. COMPARISON:  MR brain 12/23/2018. CTA head neck 12/23/2018 FINDINGS: Brain: No evidence of acute infarction, hemorrhage, hydrocephalus, extra-axial collection or mass lesion/mass effect. Remote RIGHT cerebellar infarcts. Advanced atrophy. Hypoattenuation of white matter, likely small vessel disease. Vascular: Calcification of the cavernous internal carotid arteries and distal  vertebral arteries consistent with cerebrovascular atherosclerotic disease. No signs of intracranial large vessel occlusion. Skull: Normal. Negative for fracture or focal lesion. Sinuses/Orbits: No acute finding. Other: None ASPECTS ([place] Stroke Program Early CT Score) - Ganglionic level infarction (caudate, lentiform nuclei, internal capsule, insula, M1-M3 cortex): 7 - Supraganglionic infarction (M4-M6 cortex): 3 Total score (0-10 with 10 being normal): 10 IMPRESSION: 1. Atrophy and small vessel disease. No acute intracranial findings. 2. ASPECTS is 10. 3. These results were communicated to Dr. Lorraine Lax at 3:54 pmon 1/8/2020by text page via the [facility] messaging system. * Electronically Signed   By: Staci Righter M.D.   On: 12/24/2018 15:55    Microbiology: No results found for this or any previous visit (from the past 240 hour(s)).   Labs: Basic Metabolic Panel: Recent Labs  Lab 12/23/18 0957 12/23/18 1007 12/24/18 0549 12/26/18 0700  NA 138 140 139 138  K 4.0 4.1 3.5 3.4*  CL 104 103 108 105  CO2 26  --  23 25  GLUCOSE 114* 112* 90 109*  BUN 8 9 7* 13  CREATININE 0.90 0.90 0.90 1.02*  CALCIUM 9.2  --  8.7* 8.6*   Liver Function Tests: Recent Labs  Lab 12/23/18 0957  AST 18  ALT 13  ALKPHOS 54  BILITOT 0.6  PROT 6.8  ALBUMIN 3.6   No results for input(s): LIPASE, AMYLASE in the last 168 hours. No results for input(s): AMMONIA in the last 168 hours. CBC: Recent Labs    Lab 12/23/18 0957 12/23/18 1007 12/26/18 0700  WBC 7.7  --  5.8  NEUTROABS 6.2  --   --   HGB 13.5 14.6 12.3  HCT 44.0 43.0 39.4  MCV 90.7  --  89.7  PLT 298  --  229   Cardiac Enzymes: Recent Labs  Lab 12/23/18 2152 12/24/18 0549  TROPONINI <0.03 <0.03   BNP: BNP (last 3 results) No results for input(s): BNP in the last 8760 hours.  ProBNP (last 3 results) No results for input(s): PROBNP in the last 8760 hours.  CBG: Recent Labs  Lab 12/24/18 1533  GLUCAP 88       Signed:  Desiree Hane, MD [facility] 12/26/2018, 2:19 PM

## 2018-12-26 NOTE — Care Management Important Message (Signed)
Important Message  Patient Details  Name: Ashlee Reyes MRN: [id number] Date of Birth: May 18, 1921   Medicare Important Message Given:  Yes    Orbie Pyo 12/26/2018, 3:32 PM

## 2018-12-26 NOTE — Progress Notes (Signed)
Physical Therapy Treatment Patient Details Name: Ashlee Reyes MRN: [id number] DOB: 12-30-1920 Today's Date: 12/26/2018    History of Present Illness 85 y.o. female, with past medical history significant for asthma, A. fib and CAD presenting with two days history of dizziness, vertigo, and uncontrolled blood pressure.    PT Comments    Alert and participative, but "tired".  Mobility more steady, needing subjectively less assist though still minimal.     Follow Up Recommendations  SNF     Equipment Recommendations  None recommended by PT;Other (comment)(TBA next venue)    Recommendations for Other Services       Precautions / Restrictions Precautions Precautions: Fall    Mobility  Bed Mobility Overal bed mobility: Needs Assistance Bed Mobility: Supine to Sit     Supine to sit: Min guard;HOB elevated        Transfers Overall transfer level: Needs assistance Equipment used: Rolling walker (2 wheeled) Transfers: Sit to/from [facility] Sit to Stand: Min assist;Min guard(depending on height)         General transfer comment: cues for hand placement and both boost and forward assist if surface is too low  Ambulation/Gait Ambulation/Gait assistance: Min assist Gait Distance (Feet): 84 Feet Assistive device: Rolling walker (2 wheeled) Gait Pattern/deviations: Step-through pattern     General Gait Details: mildly unstead steps with cues for posture.  Assisted maneuvering the RW   Stairs             Wheelchair Mobility    Modified Rankin (Stroke Patients Only)       Balance     Sitting balance-Leahy Scale: Good       Standing balance-Leahy Scale: Poor Standing balance comment: Requiring UE support or physical A                            Cognition Arousal/Alertness: Awake/alert Behavior During Therapy: WFL for tasks assessed/performed Overall Cognitive Status: Impaired/Different from baseline Area of  Impairment: Orientation;Following commands;Awareness;Problem solving;Memory                 Orientation Level: Situation;Place;Time   Memory: Decreased short-term memory Following Commands: Follows one step commands with increased time   Awareness: Emergent Problem Solving: Slow processing        Exercises      General Comments        Pertinent Vitals/Pain Pain Assessment: Faces Faces Pain Scale: No hurt Pain Intervention(s): Monitored during session    Home Living                      Prior Function            PT Goals (current goals can now be found in the care plan section) Acute Rehab PT Goals Patient Stated Goal: "Go to rehab to get stronger" PT Goal Formulation: With patient/family Time For Goal Achievement: 12/31/18 Potential to Achieve Goals: Good Progress towards PT goals: Progressing toward goals    Frequency    Min 2X/week      PT Plan Current plan remains appropriate    Co-evaluation              AM-PAC PT "6 Clicks" Mobility   Outcome Measure  Help needed turning from your back to your side while in a flat bed without using bedrails?: A Little Help needed moving from lying on your back to sitting on the side of a flat bed  without using bedrails?: A Little Help needed moving to and from a bed to a chair (including a wheelchair)?: A Little Help needed standing up from a chair using your arms (e.g., wheelchair or bedside chair)?: A Little Help needed to walk in hospital room?: A Little Help needed climbing 3-5 steps with a railing? : A Lot 6 Click Score: 17    End of Session   Activity Tolerance: Patient tolerated treatment well Patient left: in bed;with call bell/phone within reach;with bed alarm set;with family/visitor present Nurse Communication: Mobility status PT Visit Diagnosis: Unsteadiness on feet (R26.81);Muscle weakness (generalized) (M62.81)     Time: 7741-2878 PT Time Calculation (min) (ACUTE ONLY): 16  min  Charges:  $Gait Training: 8-22 mins                     12/26/2018  Ashlee Reyes, PT Acute Rehabilitation Services [phone]  (pager) [phone]  (office)   Ashlee Reyes Ashlee Reyes 12/26/2018, 1:44 PM

## 2018-12-26 NOTE — Clinical Social Work Placement (Signed)
Nurse to call report to [phone], Room 104A     CLINICAL SOCIAL WORK PLACEMENT  NOTE  Date:  12/26/2018  Patient Details  Name: Ashlee Reyes MRN: [id number] Date of Birth: [birth year]  Clinical Social Work is seeking post-discharge placement for this patient at the [place] level of care (*CSW will initial, date and re-position this form in  chart as items are completed):  Yes   Patient/family provided with [place] Work Department's list of facilities offering this level of care within the geographic area requested by the patient (or if unable, by the patient's family).  Yes   Patient/family informed of their freedom to choose among providers that offer the needed level of care, that participate in Medicare, Medicaid or managed care program needed by the patient, have an available bed and are willing to accept the patient.  Yes   Patient/family informed of [place]'s ownership interest in [facility] and [facility], as well as of the fact that they are under no obligation to receive care at these facilities.  PASRR submitted to EDS on       PASRR number received on       Existing PASRR number confirmed on       FL2 transmitted to all facilities in geographic area requested by pt/family on       FL2 transmitted to all facilities within larger geographic area on       Patient informed that his/her managed care company has contracts with or will negotiate with certain facilities, including the following:        Yes   Patient/family informed of bed offers received.  Patient chooses bed at [place], [place]     Physician recommends and patient chooses bed at      Patient to be transferred to [place] on 12/26/18.  Patient to be transferred to facility by PTAR     Patient family notified on 12/26/18 of transfer.  Name of family member notified:  Daughter [facility]     PHYSICIAN       Additional  Comment:    _______________________________________________ Geralynn Ochs, [place] 12/26/2018, 3:17 PM

## 2018-12-26 NOTE — Progress Notes (Signed)
   12/26/18 1232  Vitals  Temp 97.8 F (36.6 C)  Temp Source Oral  BP 126/66  BP Location Left Arm  BP Method Automatic  Patient Position (if appropriate) Sitting  Pulse Rate 84  Pulse Rate Source Monitor  Resp 16  Orthostatic Lying   BP- Lying 135/66  Pulse- Lying 73  Orthostatic Sitting  BP- Sitting 126/66  Pulse- Sitting 84  Orthostatic Standing at 0 minutes  BP- Standing at 0 minutes 128/70  Pulse- Standing at 0 minutes 86  Orthostatic Standing at 3 minutes  BP- Standing at 3 minutes 141/74  Pulse- Standing at 3 minutes 87  Oxygen Therapy  SpO2 98 %  O2 Device Room Air    Orthostatic VS as above.

## 2018-12-26 NOTE — Progress Notes (Signed)
Report given to nurse Joseph Art at [place] ([facility] garden). All belongings sent with daughter who is at bedside. Pt transported to disposition per PTAR. VSS. Denied distress.    Ave Filter, RN

## 2018-12-26 NOTE — Progress Notes (Signed)
   12/26/18 1146  Output (mL)  Urine 325 mL  Urine Characteristics  Urine Color Yellow/straw  Urine Appearance Clear  Urine Odor No odor  Bladder Scan Volume (mL) 529 mL  Post Void Cath Residual (mL) 224 mL    Patient denied any discomfort pre/post void. Pt is sitting in chair at this time for breakfast at this time. Pt denied any pain/dizziness.    Ave Filter, RN

## 2018-12-26 NOTE — Progress Notes (Signed)
STROKE TEAM PROGRESS NOTE   INTERVAL HISTORY Her daughter is at the bedside.  Patient in bed, awake, alert.  . Per daughter she is doing better today and not confused  Vitals:   12/25/18 1700 12/25/18 1902 12/26/18 0910 12/26/18 1232  BP: (!) 148/61 132/72 (!) 98/55 126/66  Pulse:   81 84  Resp:   17 16  Temp:   97.6 F (36.4 C) 97.8 F (36.6 C)  TempSrc:   Oral Oral  SpO2:  90% 97% 98%    CBC:  Recent Labs  Lab 12/23/18 0957 12/23/18 1007 12/26/18 0700  WBC 7.7  --  5.8  NEUTROABS 6.2  --   --   HGB 13.5 14.6 12.3  HCT 44.0 43.0 39.4  MCV 90.7  --  89.7  PLT 298  --  376    Basic Metabolic Panel:  Recent Labs  Lab 12/24/18 0549 12/26/18 0700  NA 139 138  K 3.5 3.4*  CL 108 105  CO2 23 25  GLUCOSE 90 109*  BUN 7* 13  CREATININE 0.90 1.02*  CALCIUM 8.7* 8.6*   Lipid Panel:     Component Value Date/Time   CHOL 218 (H) 12/26/2018 0700   TRIG 147 12/26/2018 0700   HDL 40 (L) 12/26/2018 0700   CHOLHDL 5.5 12/26/2018 0700   VLDL 29 12/26/2018 0700   LDLCALC 149 (H) 12/26/2018 0700   HgbA1c:  Lab Results  Component Value Date   HGBA1C 5.6 12/26/2018   Urine Drug Screen:     Component Value Date/Time   LABOPIA NONE DETECTED 12/23/2018 1102   COCAINSCRNUR NONE DETECTED 12/23/2018 1102   LABBENZ NONE DETECTED 12/23/2018 1102   AMPHETMU NONE DETECTED 12/23/2018 1102   THCU NONE DETECTED 12/23/2018 1102   LABBARB NONE DETECTED 12/23/2018 1102    Alcohol Level     Component Value Date/Time   ETH <10 12/23/2018 0957    IMAGING Ct Head Code Stroke Wo Contrast  Result Date: 12/24/2018 CLINICAL DATA:  Code stroke.  Slurred speech. EXAM: CT HEAD WITHOUT CONTRAST TECHNIQUE: Contiguous axial images were obtained from the base of the skull through the vertex without intravenous contrast. COMPARISON:  MR brain 12/23/2018. CTA head neck 12/23/2018 FINDINGS: Brain: No evidence of acute infarction, hemorrhage, hydrocephalus, extra-axial collection or mass  lesion/mass effect. Remote RIGHT cerebellar infarcts. Advanced atrophy. Hypoattenuation of white matter, likely small vessel disease. Vascular: Calcification of the cavernous internal carotid arteries and distal vertebral arteries consistent with cerebrovascular atherosclerotic disease. No signs of intracranial large vessel occlusion. Skull: Normal. Negative for fracture or focal lesion. Sinuses/Orbits: No acute finding. Other: None ASPECTS ([place] Stroke Program Early CT Score) - Ganglionic level infarction (caudate, lentiform nuclei, internal capsule, insula, M1-M3 cortex): 7 - Supraganglionic infarction (M4-M6 cortex): 3 Total score (0-10 with 10 being normal): 10 IMPRESSION: 1. Atrophy and small vessel disease. No acute intracranial findings. 2. ASPECTS is 10. 3. These results were communicated to Dr. Lorraine Lax at 3:54 pmon 1/8/2020by text page via the [facility] messaging system. * Electronically Signed   By: Staci Righter M.D.   On: 12/24/2018 15:55    PHYSICAL EXAM Pleasant elderly Caucasian lady not in distress. She is hard of hearing. . Afebrile. Head is nontraumatic. Neck is supple without bruit.    Cardiac exam no murmur or gallop. Lungs are clear to auscultation. Distal pulses are well felt. Neurological Exam.:  Awake alert oriented to place and person only. Diminished attention, registration and recall. Poor insight into illness. Able to  name only 4 animals with 4 legs.close simple one and two-step commands. Speech is normal. Eye movements are full range without nystagmus. Blinks to threat bilaterally. Fundi not visualized. Tongue midline. No facial weakness. Motor system exam no solid 4 extremities equally well against gravity but left upper extremity movements limited due to mechanical elbow pain. No focal weakness. Deep tendon defects are symmetric. Plantars downgoing. Gait not tested. ASSESSMENT/PLAN Ms. Letzy Gullickson is a 85 y.o. female with history of HLD, CAD, A fib, AAA presenting with  dizziness, syncope and uncontrolled HTN.  Work up this far has been MRI brain neg, CTA h/n shows some min bilat carotid stenosis, Bilat distal vert stenosis. Echo pending. Tele without arrythmia. Today the pts dtr says she became confused and had slurred speech after awakening from a nap. There is no focal deficits, no aphasia, just confused to date/place/age.She does have confusion at times, but family states she is not usually "this" confused. She c/o head and neck ache as well as her typical abd pain which is not new for her. No further dysarthria evident on my exam at bedside. Stat [place] show no acute changes. Blood sugar checked= 88.  doubtTIA: likely transient confusion with baseline mild cognitive impairment  Code Stroke CT head No acute stroke. Small vessel disease. Atrophy.ASPECTS 10.    CTA head & neck 1. Negative for emergent large vessel occlusion 2. Mild stenosis distal left common carotid artery. Carotid bifurcation mildly disease bilaterally without significant stenosis. Mild atherosclerotic stenosis in the cavernous carotid bilaterally. 3. Mild stenosis distal vertebral artery bilaterally. 4. 2 cm sub solid density right upper lobe. Recommend CT chest at this time for baseline evaluation. This could be due to scarring or neoplasm.  MRI  Truncated examination.2. No acute intracranial abnormality. 3. 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 EF 65-70%. Normal RV size and systolic function. No significant valvular  abnormalities.  LDL 149 mg%  HgbA1c 5.9  Heparin for VTE prophylaxis Diet Order            Diet Heart Room service appropriate? Yes; Fluid consistency: Thin  Diet effective now              clopidogrel 75 mg daily prior to admission, now on clopidogrel 75 mg daily.   Therapy recommendations:  SNF  Disposition:  Pending; probable SNF  Hypertension  Unstable . Permissive  hypertension (OK if < 220/120) but gradually normalize in 5-7 days . Long-term BP goal normotensive  Hyperlipidemia  Home meds:  none,   LDL 149, goal < 70   Continue statin at discharge  Diabetes type II  HgbA1c pending goal < 7.0  controlled  Other Stroke Risk Factors  Advanced age  Family hx stroke (Brother)  Coronary artery disease  Other Active Problems  GERD  COPD  Chronic back pain     Hospital day # 3  She presented with transient confusion and dizziness which appears to resolve and brain imaging does not support diagnosis of stroke. I suspect she has baseline mild cognitive impairment which had decompensated. Recommend no further stroke workup. Follow-up as an outpatient in neurology clinic for treatment for dementia and cognitive impairment if family is interested. Long discussion with patient and daughter and answered questions.  Stroke team will sign off. Call for questions. Discussed with Dr.Nettey  Antony Contras, MD Medical Director [place] Pager: [phone] 12/26/2018 12:54 PM  To contact Stroke Continuity provider, please refer to [email or web address] After hours, contact General Neurology

## 2018-12-28 DIAGNOSIS — M542 Cervicalgia: Secondary | ICD-10-CM | POA: Diagnosis not present

## 2018-12-28 DIAGNOSIS — R2689 Other abnormalities of gait and mobility: Secondary | ICD-10-CM | POA: Diagnosis not present

## 2018-12-28 DIAGNOSIS — S32010D Wedge compression fracture of first lumbar vertebra, subsequent encounter for fracture with routine healing: Secondary | ICD-10-CM | POA: Diagnosis not present

## 2018-12-28 DIAGNOSIS — R55 Syncope and collapse: Secondary | ICD-10-CM | POA: Diagnosis not present

## 2018-12-28 DIAGNOSIS — I951 Orthostatic hypotension: Secondary | ICD-10-CM | POA: Diagnosis not present

## 2018-12-28 DIAGNOSIS — I10 Essential (primary) hypertension: Secondary | ICD-10-CM | POA: Diagnosis not present

## 2018-12-28 DIAGNOSIS — M1712 Unilateral primary osteoarthritis, left knee: Secondary | ICD-10-CM | POA: Diagnosis not present

## 2018-12-30 ENCOUNTER — Telehealth (INDEPENDENT_AMBULATORY_CARE_PROVIDER_SITE_OTHER): Payer: Self-pay | Admitting: *Deleted

## 2018-12-30 NOTE — Telephone Encounter (Signed)
Needs OV for neck pain per Dr. Ernestina Patches.

## 2018-12-30 NOTE — Telephone Encounter (Signed)
Pt is scheduled for an ov 02/05/2019

## 2019-01-01 ENCOUNTER — Other Ambulatory Visit: Payer: Self-pay | Admitting: *Deleted

## 2019-01-01 NOTE — Patient Outreach (Signed)
[facility]) Care Management  01/01/2019  Ashlee Reyes 19-Sep-1921 [id number]    Attended interdisciplinary team meeting at [place] with [facility] UM team member Ashlee Reyes to discuss patient's progress and plan for transitioning to home. PT states patient has been at the facility for 6 days and doing well.  Nurse states patient has not had b/p issues while at the facility but daughter is concerned patient seems depressed. Discharge planner states they have a careplan meeting scheduled for today to discuss discharge plans.  When admitted to facility d/c plan was for patient to return home with daughter Ashlee Reyes where she previously resided.  No discharge date has been set at this time.  Met with patient and daughters Ashlee Reyes and Ashlee Reyes at the bedside. Introduced Psychologist, forensic and gave [facility] patient packet with contact information. Daughter Ashlee Reyes) reports that patient had "complaints of severe headache, dizziness with elevated blood pressure on the 200's and had passed out" that had resulted in patient being admitted to the hospital. (acute confusion, uncontrolled blood pressure, syncopal episode, hypertensive urgency and orthostatic hypotension) Ashlee Reyes stated patient lives with her and she would like for patient to return home with her.  Ashlee Reyes reports her sister and the patient's caregiver has had two surgeries this year and has had a hard time being able to transport patient.  Ashlee Reyes states she would like to speak with [facility] LCSW about transportation options in their area.  Ashlee Reyes stated she would also like to ask about other caregiver resources in their area. Ashlee Reyes stated she prepares patient's medications and patient does have certain medications she would like to speak with [facility] Pharmacist about regarding cost. Patient stated she felt she was doing well with therapy but she was frustrated with her memory loss.  Patient gave permission for  [facility] CM to speak with her daughter Ashlee Reyes on her behalf.   Referral placed for [facility] Pharmacist and [facility] LCSW.   I will monitor for discharge from SNF and make [place] aware of discharge date.  [facility] UM team member aware of [facility] CM referral.   Ashlee Altland RN, [place] Acute Care Coordinator [phone]) Business Mobile [phone]) Toll free office

## 2019-01-03 ENCOUNTER — Other Ambulatory Visit: Payer: Self-pay | Admitting: *Deleted

## 2019-01-03 NOTE — Patient Outreach (Signed)
[facility]) Care Management  01/03/2019  Joleen Stuckert [birth year] [id number]  Post Acute Care Coordination  Phone call to patient's daughter [name] on 01/02/19 by the request of SNF Liaison to provide her with caregiver and transportation resources. Per patient's daughter, patient will discharge to her home, however she is not sure of a discharge date. Per patient's daughter, she is not sure what transportation or in home needs patient will have until she arrives home and she is able to see how her mobility will be.  Per patient's daughter, she would like to assess if patient can transfer in and out of the car. If she can, then she will be able to transfer patient to medical appointments.  She further discussed that patient is also receiving Palliative Care services and they can assist with resources as well.  Plan: This Education officer, museum will follow up with patient's daughter within 2 weeks to assess for transportation and in home needs.    Sheralyn Boatman [facility] Care Management [phone]

## 2019-01-06 ENCOUNTER — Ambulatory Visit: Payer: Self-pay | Admitting: Pharmacist

## 2019-01-06 ENCOUNTER — Other Ambulatory Visit: Payer: Medicare Other

## 2019-01-06 DIAGNOSIS — Z515 Encounter for palliative care: Secondary | ICD-10-CM

## 2019-01-07 NOTE — Progress Notes (Signed)
COMMUNITY PALLIATIVE CARE SW NOTE  PATIENT NAME: Ashlee Reyes DOB: 29-May-1921 MRN: [id number]  PRIMARY CARE PROVIDER: Abner Greenspan, MD  RESPONSIBLE PARTY:  Acct ID - Guarantor Home Phone Work Phone Relationship Acct Type  [id number] ROYELLE, [id number]  Self P/F     [address], [place], [place] [address]     PLAN OF CARE and INTERVENTIONS:             1. GOALS OF CARE/ ADVANCE CARE PLANNING:  Patient has DNR in place. No changes to patient's wishes. 2. SOCIAL/EMOTIONAL/SPIRITUAL ASSESSMENT/ INTERVENTIONS:  Patient currently at [facility] for short term rehab after recent hospital admission for elevated blood pressure. Patient is alert and mostly oriented, however is having difficulty with increased forgetfulness. Patient continues to repeat that she would like to go home and does not remember her hospitalization or why she is at the facility. Patient's daughter present, and continues to visit patient daily to provide care and assistance. Patient's son present in the room with patient as he is a permanent resident at [facility]. No significant needs identified at this time. 3. PATIENT/CAREGIVER EDUCATION/ COPING:  Patient's daughter [name] is very active and involved in patient's care. She has been visiting patient at least daily and has open communication with facility staff. [name] reports that she anticipates that patient will be returning home soon and looks forward to getting back in "their routine".  4. PERSONAL EMERGENCY PLAN:  None- patient at facility. 5. COMMUNITY RESOURCES COORDINATION/ HEALTH CARE NAVIGATION:  Spoke with Benjamine Mola, SW at the facility who states according to therapy and team, patient is progressing well and will likely be discharging home soon. Order was written by physician to continue palliative care in the facility and home upon discharge.  6. FINANCIAL/LEGAL CONCERNS/INTERVENTIONS:  None     SOCIAL HX:  Social History   Tobacco Use  .  Smoking status: Never Smoker  . Smokeless tobacco: Never Used  Substance Use Topics  . Alcohol use: No    Alcohol/week: 0.0 standard drinks    CODE STATUS:   Code Status: Prior  ADVANCED DIRECTIVES: N MOST FORM COMPLETE:  no HOSPICE EDUCATION PROVIDED: None this visit-   [id number] ambulates with walker. Able to toilet and feed herself.       Atha Starks

## 2019-01-09 ENCOUNTER — Ambulatory Visit: Payer: Self-pay | Admitting: Pharmacist

## 2019-01-10 DIAGNOSIS — M48061 Spinal stenosis, lumbar region without neurogenic claudication: Secondary | ICD-10-CM | POA: Diagnosis not present

## 2019-01-10 DIAGNOSIS — K219 Gastro-esophageal reflux disease without esophagitis: Secondary | ICD-10-CM | POA: Diagnosis not present

## 2019-01-10 DIAGNOSIS — E785 Hyperlipidemia, unspecified: Secondary | ICD-10-CM | POA: Diagnosis not present

## 2019-01-10 DIAGNOSIS — I951 Orthostatic hypotension: Secondary | ICD-10-CM | POA: Diagnosis not present

## 2019-01-10 DIAGNOSIS — F329 Major depressive disorder, single episode, unspecified: Secondary | ICD-10-CM | POA: Diagnosis not present

## 2019-01-10 DIAGNOSIS — H04123 Dry eye syndrome of bilateral lacrimal glands: Secondary | ICD-10-CM | POA: Diagnosis not present

## 2019-01-10 DIAGNOSIS — G8929 Other chronic pain: Secondary | ICD-10-CM | POA: Diagnosis not present

## 2019-01-10 DIAGNOSIS — J45909 Unspecified asthma, uncomplicated: Secondary | ICD-10-CM | POA: Diagnosis not present

## 2019-01-10 DIAGNOSIS — H409 Unspecified glaucoma: Secondary | ICD-10-CM | POA: Diagnosis not present

## 2019-01-10 DIAGNOSIS — M1712 Unilateral primary osteoarthritis, left knee: Secondary | ICD-10-CM | POA: Diagnosis not present

## 2019-01-10 DIAGNOSIS — R911 Solitary pulmonary nodule: Secondary | ICD-10-CM | POA: Diagnosis not present

## 2019-01-10 DIAGNOSIS — I10 Essential (primary) hypertension: Secondary | ICD-10-CM | POA: Diagnosis not present

## 2019-01-10 DIAGNOSIS — S32010D Wedge compression fracture of first lumbar vertebra, subsequent encounter for fracture with routine healing: Secondary | ICD-10-CM | POA: Diagnosis not present

## 2019-01-10 DIAGNOSIS — I2510 Atherosclerotic heart disease of native coronary artery without angina pectoris: Secondary | ICD-10-CM | POA: Diagnosis not present

## 2019-01-10 DIAGNOSIS — Z8673 Personal history of transient ischemic attack (TIA), and cerebral infarction without residual deficits: Secondary | ICD-10-CM | POA: Diagnosis not present

## 2019-01-10 DIAGNOSIS — M8580 Other specified disorders of bone density and structure, unspecified site: Secondary | ICD-10-CM | POA: Diagnosis not present

## 2019-01-10 DIAGNOSIS — Z9181 History of falling: Secondary | ICD-10-CM | POA: Diagnosis not present

## 2019-01-10 DIAGNOSIS — Z87440 Personal history of urinary (tract) infections: Secondary | ICD-10-CM | POA: Diagnosis not present

## 2019-01-12 ENCOUNTER — Ambulatory Visit (INDEPENDENT_AMBULATORY_CARE_PROVIDER_SITE_OTHER): Payer: Medicare Other | Admitting: Family Medicine

## 2019-01-12 ENCOUNTER — Other Ambulatory Visit: Payer: Self-pay | Admitting: *Deleted

## 2019-01-12 ENCOUNTER — Ambulatory Visit: Payer: Self-pay | Admitting: Pharmacist

## 2019-01-12 ENCOUNTER — Encounter: Payer: Self-pay | Admitting: Family Medicine

## 2019-01-12 ENCOUNTER — Telehealth: Payer: Self-pay | Admitting: Family Medicine

## 2019-01-12 ENCOUNTER — Other Ambulatory Visit: Payer: Self-pay | Admitting: Allergy and Immunology

## 2019-01-12 ENCOUNTER — Telehealth: Payer: Self-pay | Admitting: *Deleted

## 2019-01-12 ENCOUNTER — Other Ambulatory Visit: Payer: Self-pay | Admitting: Pharmacist

## 2019-01-12 ENCOUNTER — Encounter: Payer: Self-pay | Admitting: *Deleted

## 2019-01-12 ENCOUNTER — Other Ambulatory Visit: Payer: Self-pay

## 2019-01-12 VITALS — BP 142/74 | HR 62 | Temp 97.5°F | Ht 64.0 in | Wt 171.0 lb

## 2019-01-12 DIAGNOSIS — I951 Orthostatic hypotension: Secondary | ICD-10-CM

## 2019-01-12 DIAGNOSIS — G8929 Other chronic pain: Secondary | ICD-10-CM | POA: Diagnosis not present

## 2019-01-12 DIAGNOSIS — I714 Abdominal aortic aneurysm, without rupture, unspecified: Secondary | ICD-10-CM

## 2019-01-12 DIAGNOSIS — N390 Urinary tract infection, site not specified: Secondary | ICD-10-CM

## 2019-01-12 DIAGNOSIS — M1712 Unilateral primary osteoarthritis, left knee: Secondary | ICD-10-CM | POA: Diagnosis not present

## 2019-01-12 DIAGNOSIS — Z515 Encounter for palliative care: Secondary | ICD-10-CM | POA: Diagnosis not present

## 2019-01-12 DIAGNOSIS — I160 Hypertensive urgency: Secondary | ICD-10-CM | POA: Diagnosis not present

## 2019-01-12 DIAGNOSIS — E7800 Pure hypercholesterolemia, unspecified: Secondary | ICD-10-CM

## 2019-01-12 DIAGNOSIS — Z7409 Other reduced mobility: Secondary | ICD-10-CM | POA: Diagnosis not present

## 2019-01-12 DIAGNOSIS — J3089 Other allergic rhinitis: Secondary | ICD-10-CM

## 2019-01-12 DIAGNOSIS — M48061 Spinal stenosis, lumbar region without neurogenic claudication: Secondary | ICD-10-CM | POA: Diagnosis not present

## 2019-01-12 DIAGNOSIS — S32010D Wedge compression fracture of first lumbar vertebra, subsequent encounter for fracture with routine healing: Secondary | ICD-10-CM | POA: Diagnosis not present

## 2019-01-12 DIAGNOSIS — I10 Essential (primary) hypertension: Secondary | ICD-10-CM | POA: Diagnosis not present

## 2019-01-12 MED ORDER — AMLODIPINE BESYLATE 2.5 MG PO TABS: ORAL_TABLET | ORAL | 11 refills | Status: AC

## 2019-01-12 MED ORDER — CETIRIZINE HCL 10 MG PO TABS: ORAL_TABLET | ORAL | 11 refills | Status: DC

## 2019-01-12 NOTE — Assessment & Plan Note (Signed)
This is intermittent and can even occur in setting of hypertensive urgency  Reviewed hospital records, lab results and studies in detail   Reassuring vitals and exam today  Family is encouraging fluid intake  Walking with walker and assistance (home care is visiting) , also rec a palliative care visit  Holding amlodipine for bp over 145/90 or worse  Continue to watch Low threshold for cardiology visit if needed

## 2019-01-12 NOTE — Assessment & Plan Note (Signed)
Clear UA this hospitalization  No symptoms currently

## 2019-01-12 NOTE — Assessment & Plan Note (Signed)
Had had visit with palliative care to discuss goals and safety issues Family has found this helpful

## 2019-01-12 NOTE — Patient Outreach (Signed)
[facility]) Care Management [facility] CM Telephone Outreach, Transition of Care outreach Post- SNF discharge day # 3 Caregiver declined [place] services 01/12/2019  Amisha Pospisil 1921-08-22 [id number]  Successful telephone outreach to "Ashlee Reyes," caregiver/ daughter of Ashlee Reyes, 85 y/o female referred to [facility] by [facility] after recent hospitalization January 7-10, 2020 for dizziness, lightheadedness, confusion, HTN emergency; patient was discharged from hospital to SNF for rehabilitation, and was subsequently discharged form SNF on January 09, 2019.   Patient provided verbal consent/ request to speak with daughter/ caregiver Ashlee Reyes at time of [facility] outreach on January 01, 2019.  HIPAA/ identity verified with patient's caregiver today.  Today, caregiver stated that she is confused around [place] services/ role was discussed with caregiver; explained that call was placed today as a result of [facility], which caregiver recalls.  Discussed difference between home health services and [place] services with caregiver and explained that referrals to [facility] CSW and pharmacist had also been placed.  Today, caregiver stated that she believes things are "going very well" now that patient is at home, and she confirms that home health team have completed outreach and that Palliative Care remains active in patient's care at home.  Caregiver stated that she "does not need all these nurses involved;" and stated that she believes patient "has plenty of peope already involved in her care."  Caregiver politely declines [place] RN CM involvement; explained that she should still expect to hear from [facility] CSW/ pharmacy team, and she is agreeable to speaking to both, although she acknowledges that "I don't mother qualifies for anything."  Caregiver denies further issues, concerns, or problems today;  I  confirmed that she has [place] CM packet with main numbers should she reconsider and wish to participate in [place] program.  Caregiver stated that if she decided to participate, she owuld contact [place] CM office.  Plan:  Will update [facility] CM team and assigned [facility] CM around caregiver's decision not to participate in [place] RN CM program, and will make patient's PCP aware of same.  Oneta Rack, RN, BSN, [facility] Care Management  [phone]

## 2019-01-12 NOTE — Telephone Encounter (Signed)
Need verba order for PT. Effective 1.25.20 1wk 1   2wk 3  1w 1

## 2019-01-12 NOTE — Progress Notes (Signed)
Subjective:    Patient ID: Ashlee Reyes, female    DOB: [birth year], 85 y.o.   MRN: [id number]  HPI  Here for f/u after hosp/rehab stay at [facility]  She was hospitalized from 1/7 to 12/26/18 for dizziness Found to have hypertensive urgency as well as orthostatic hypotension   Upon arrival pt had CT head/MRI brain and CT angio of head and neck that were reassurine TTE showed preserved EF w/o wall motion abnormalities  Nl arrhythmia on tele  A/P scanned from hospital stay as follows   Hospital Course:  Orthostatic syncope, resolved Upon initial arrival, her syncope was worked up with both CT head and MRI brain and CT angios head and neck which were both negative.  TTE showed preserved EF with no wall motion abnormalities on telemetry she had no abnormal rhythms. She was found to be quite orthostatic upon arrival which improved with supportive care.  Patient did not need compression stockings or other medications to maintain normal blood pressure with changes in position.  Orthostatic vitals were obtained on day of discharge and remained within normal limits.  Family wonders if she may have been dehydrated  No particular triggers  When ems came 716 systolic  Family had given her dramamine   Hypertensive urgency, resolved Hypertension, new diagnosis Upon arrival blood pressure was quite elevated with systolics in the 967E.  Given her concomitant orthostatic syncope was started on low-dose amlodipine 2.5 mg with great control of blood pressure.  Will continue on discharge.  Will need BP check at SNF PCP.  Acute confusion, most likely delirium in setting of mild cognitive impairment  During hospital stay patient did have an abrupt change in mental status with increased lethargy and decreased fussiness.  Code stroke was called, CT head at that time showed no acute intracranial abnormalities.  Neurology did not think this was consistent with a TIA more likely related to  transient delirium in a patient with mild cognitive impairment.  Since then patient has been able to maintain her mental status baseline which is her being alert to self, place only which is corroborated by her daughter  Chronic neck pain Ongoing for past 3 months.  Maintain good pain control with her home Tylenol and PRN tramadol.  Did not have any neurological deficits during hospitalization.  History of nonhemorrhagic CVA CT imaging does show mild ischemic changes consistent with chronic infarcts (patient has been on Plavix since 2016 for symptoms of slurred speech.  She will continue on discharge.  Hyperlipidemia, stable  Lab Results  Component Value Date   CHOL 218 (H) 12/26/2018   HDL 40 (L) 12/26/2018   LDLCALC 149 (H) 12/26/2018   LDLDIRECT 161.0 02/06/2016   TRIG 147 12/26/2018   CHOLHDL 5.5 12/26/2018     GERD continue PPI Pt states she has had burning in her belly -for 20y  Sees [facility]  Controls with food and maalox  Can get constipated  (had small bm this am)  Appetite is good and she is eating regularly   History of chronic UTI/interstitial cystitis UA here showed no acute UTI Family would like to continue chronic Bactrim Did have brief episode of urinary incontinence while here which resolved with intermittent in and out catheterizations as she was mildly retaining urine.  Upon discharge patient was able to empty her bladder on her own without any catheterization.  History of AAA repair (status post stent 2014) Vitals duplex ultrasound on 11/2018, diameter at that time  3.7 cm.  Unchanged from prior.  Outpatient follow-up in 18 months with vascular.  Labs: Lab Results  Component Value Date   CREATININE 1.02 (H) 12/26/2018   BUN 13 12/26/2018   NA 138 12/26/2018   K 3.4 (L) 12/26/2018   CL 105 12/26/2018   CO2 25 12/26/2018   Lab Results  Component Value Date   ALT 13 12/23/2018   AST 18 12/23/2018   ALKPHOS 54 12/23/2018   BILITOT 0.6 12/23/2018    Lab Results  Component Value Date   WBC 5.8 12/26/2018   HGB 12.3 12/26/2018   HCT 39.4 12/26/2018   MCV 89.7 12/26/2018   PLT 229 12/26/2018   Lab Results  Component Value Date   HGBA1C 5.6 12/26/2018   She was then d/c to [facility] for rehab /PT bp was noted to be low so amlodipine was d/c  Wt Readings from Last 3 Encounters:  01/12/19 171 lb (77.6 kg)  12/04/18 179 lb (81.2 kg)  12/02/18 179 lb (81.2 kg)  lost 8 lb  Did not eat well in the hospital  29.35 kg/m   BP Readings from Last 3 Encounters:  01/12/19 (!) 142/74  12/26/18 (!) 169/89  12/04/18 128/77   Continued to have neck pain   Has since had a palliative care encounter 1/21 Family is happy with that  Rehab is coming to there house  Doing her home exercises   C/o of a soreness in lateral knee L  Arthritis - has had shots in that knee   Is glad to be home  Neck pain is off and on - overall improved  Has appt with Dr Newton feb 20th    Patient Active Problem List   Diagnosis Date Noted  . Hypertensive urgency 12/24/2018  . Compression fracture of L1 lumbar vertebra (HCC) 09/26/2018  . Mobility impaired 09/26/2018  . History of endovascular stent graft for abdominal aortic aneurysm (AAA) 01/01/2018  . Unilateral primary osteoarthritis, left knee 07/11/2017  . Hyperlipidemia 03/12/2017  . Frequent UTI/interstitial cystitis 02/06/2016  . Hypotension 01/09/2016  . Slurred speech 06/01/2015  . Carotid stenosis 06/01/2015  . Local reaction to immunization 02/21/2015  . Encounter for Medicare annual wellness exam 02/15/2015  . Estrogen deficiency 02/15/2015  . Sinusitis, chronic 01/12/2015  . IBS (irritable bowel syndrome) 01/12/2015  . Orthostatic hypotension 01/11/2015  . Dizziness   . Palpitations 05/13/2014  . Second degree heart block 10/18/2013  . Syncope 10/17/2013  . Aneurysm of abdominal vessel ([place]) 06/25/2013  . Osteopenia 01/13/2013  . CAD (coronary artery disease)   .  Chronic depression 06/03/2007  . GERD 06/02/2007  . Asthma   . Chronic interstitial cystitis   . Osteoarthritis   . Spinal stenosis of lumbar region   . History of urinary tract infection    Past Medical History:  Diagnosis Date  . AAA (abdominal aortic aneurysm) ([place])    followed by Dr Oneida Alar. Last u/s07/10 stable AAA with largest measurement 3.97 x 4.02cm.  . Allergic rhinitis, seasonal   . Asthma   . Atrial fibrillation ([place])   . CAD (coronary artery disease) 07/2009   mod by cath 08/10  . Complication of anesthesia   . Depression   . Disc disease, degenerative, lumbar or lumbosacral    SPINAL STENOSIS  . Diverticulosis   . GERD (gastroesophageal reflux disease)   . Hemorrhoids   . History of recurrent UTIs   . Hyperlipidemia   . IBS (irritable bowel syndrome)   .  IC (irritable colon)    DMSO in past  . Interstitial cystitis   . Osteoarthritis    spine/spinal stenosis  . Osteopenia 2011  . PONV (postoperative nausea and vomiting)   . Skin cancer    basal cell cancer - face.   Past Surgical History:  Procedure Laterality Date  . ABDOMINAL AORTIC ENDOVASCULAR STENT GRAFT N/A 07/01/2013   Procedure: ABDOMINAL AORTIC ENDOVASCULAR STENT GRAFT- GORE;  Surgeon: Elam Dutch, MD;  Location: [facility];  Service: Vascular;  Laterality: N/A;  Ultrasound guided  . ABDOMINAL HYSTERECTOMY     partial not cancer  . BREAST SURGERY     lumpectomy  . CYSTOCELE REPAIR    . ESOPHAGOGASTRODUODENOSCOPY  09/1998   erosive esphagitis ? stricture treated 12/2002  . HERNIA REPAIR  16/1096   umbilical hernia  . RECTOCELE REPAIR    . REFRACTIVE SURGERY     for film after cataract surgery.  Marland Kitchen RIGHT COLECTOMY  1997   benign  . SKIN LESION EXCISION  05/2011   basal cell skin lesion removed.   Social History   Tobacco Use  . Smoking status: Never Smoker  . Smokeless tobacco: Never Used  Substance Use Topics  . Alcohol use: No    Alcohol/week: 0.0 standard drinks  . Drug use: No    Family History  Problem Relation Age of Onset  . Heart disease Mother        deceased age 65 heart attack.  Marland Kitchen Heart attack Mother   . Other Mother        varicose veins  . Varicose Veins Mother   . Heart disease Father        age 35 heart attack  . Cancer Father   . Stroke Brother   . Cancer Brother   . Heart disease Brother   . Heart attack Brother   . Cancer Daughter   . Diabetes Son    Allergies  Allergen Reactions  . Amoxicillin-Pot Clavulanate Nausea And Vomiting    Has patient had a PCN reaction causing immediate rash, facial/tongue/throat swelling, SOB or lightheadedness with hypotension: No Has patient had a PCN reaction causing severe rash involving mucus membranes or skin necrosis: No Has patient had a PCN reaction that required hospitalization: No Has patient had a PCN reaction occurring within the last 10 years: No If all of the above answers are "NO", then may proceed with Cephalosporin use.   . Avelox [Moxifloxacin] Nausea And Vomiting  . Flovent Hfa [Fluticasone] Other (See Comments)    "makes her feel funny" per family member  . Ibuprofen Other (See Comments)    GI upset  . Keflex [Cephalexin] Other (See Comments)    abd pain / GI upset  . Nitrofurantoin Other (See Comments)    Chest pain or indigestion with nausea  . Omeprazole Nausea Only  . Prevnar [Pneumococcal 13-Val Conj Vacc] Itching    Severe local reaction with redness and itching   . Rosuvastatin Other (See Comments)    Foot pain  . Sertraline Hcl Other (See Comments)    Made her more depressed  . Tetanus Toxoids     Local redness and swelling    Current Outpatient Medications on File Prior to Visit  Medication Sig Dispense Refill  . acetaminophen (TYLENOL) 500 MG tablet Take 1 tablet (500 mg total) by mouth every 4 (four) hours as needed for mild pain. 30 tablet 0  . albuterol (PROAIR HFA) 108 (90 Base) MCG/ACT inhaler Inhale 2 puffs  into the lungs every 4 (four) hours as needed for  wheezing. 1 Inhaler 2  . beclomethasone (QVAR REDIHALER) 80 MCG/ACT inhaler Inhale 2 puffs into the lungs 2 (two) times daily. 1 Inhaler 0  . clopidogrel (PLAVIX) 75 MG tablet Take 1 tablet (75 mg total) by mouth daily. 30 tablet 11  . docusate sodium (COLACE) 100 MG capsule Take 1 capsule (100 mg total) by mouth every evening. 10 capsule 0  . fluticasone (FLONASE) 50 MCG/ACT nasal spray Place 2 sprays into both nostrils daily. 16 g 11  . LUMIGAN 0.01 % SOLN Place 1 drop into both eyes at bedtime.    Marland Kitchen NEXIUM 40 MG capsule TAKE ONE (1) CAPSULE BY MOUTH 2 TIMES DAILY BEFORE A MEAL (Patient taking differently: Take 40 mg by mouth See admin instructions. TAKE ONE (1) CAPSULE every morning. Takes a second dose later in the day as needed for heartburn.) 60 capsule 11  . ondansetron (ZOFRAN ODT) 4 MG disintegrating tablet Take 1 tablet (4 mg total) by mouth every 8 (eight) hours as needed for nausea or vomiting. 20 tablet 1  . Polyethyl Glycol-Propyl Glycol (SYSTANE OP) Place 1 drop into both eyes every 8 (eight) hours as needed (dry eyes).     . polyethylene glycol (MIRALAX / GLYCOLAX) packet Take 17 g by mouth daily as needed for mild constipation. 14 each 0  . Probiotic Product (ALIGN) 4 MG CAPS Take 1 capsule (4 mg total) by mouth daily.    . traMADol (ULTRAM) 50 MG tablet Take 1/2 pill by mouth each am and 1 tab QHS 20 tablet 0  . trimethoprim (TRIMPEX) 100 MG tablet Take 1 tablet (100 mg total) by mouth daily with lunch. 30 tablet 0  . buPROPion (WELLBUTRIN SR) 100 MG 12 hr tablet TK 1 T PO D FOR DEPRESSION     No current facility-administered medications on file prior to visit.      Review of Systems  Constitutional: Positive for appetite change and fatigue. Negative for activity change, fever and unexpected weight change.  HENT: Negative for congestion, ear pain, rhinorrhea, sinus pressure and sore throat.   Eyes: Negative for pain, redness and visual disturbance.  Respiratory: Negative for  cough, shortness of breath and wheezing.   Cardiovascular: Negative for chest pain, palpitations and leg swelling.  Gastrointestinal: Negative for abdominal pain, blood in stool, constipation and diarrhea.  Endocrine: Negative for polydipsia and polyuria.  Genitourinary: Negative for dysuria, frequency and urgency.       Baseline dysuria with IC  Not bad today however   Musculoskeletal: Positive for arthralgias and neck pain. Negative for back pain and myalgias.  Skin: Negative for pallor and rash.  Allergic/Immunologic: Negative for environmental allergies.  Neurological: Positive for weakness. Negative for dizziness, tremors, seizures, syncope, facial asymmetry, speech difficulty, light-headedness, numbness and headaches.       Generalized weakness-not focal   Generally poor balance Light headedness is resolved   Hematological: Negative for adenopathy. Does not bruise/bleed easily.  Psychiatric/Behavioral: Negative for decreased concentration and dysphoric mood. The patient is not nervous/anxious.        More cognitive slowing /signs of dementia Was confused in the hospital and that is much improved         Objective:   Physical Exam Constitutional:      General: She is not in acute distress.    Appearance: Normal appearance. She is well-developed.     Comments: Frail appearing elderly female  HENT:  Head: Normocephalic and atraumatic.     Nose: Nose normal.     Mouth/Throat:     Mouth: Mucous membranes are moist.     Pharynx: Oropharynx is clear.  Eyes:     General: No scleral icterus.       Right eye: No discharge.        Left eye: No discharge.     Conjunctiva/sclera: Conjunctivae normal.     Pupils: Pupils are equal, round, and reactive to light.  Neck:     Musculoskeletal: Normal range of motion and neck supple. No neck rigidity.     Thyroid: No thyromegaly.     Vascular: Carotid bruit present. No JVD.     Comments: Tender over L trapezius muscle  No swelling    Nl rom of CS  Cardiovascular:     Rate and Rhythm: Normal rate and regular rhythm.     Pulses: Normal pulses.     Heart sounds: Normal heart sounds. No gallop.   Pulmonary:     Effort: Pulmonary effort is normal. No respiratory distress.     Breath sounds: Normal breath sounds. No stridor. No wheezing, rhonchi or rales.  Chest:     Chest wall: No tenderness.  Abdominal:     General: Bowel sounds are normal. There is no distension or abdominal bruit.     Palpations: Abdomen is soft. There is no mass.     Tenderness: There is no abdominal tenderness.  Musculoskeletal:     Right lower leg: No edema.     Left lower leg: No edema.  Lymphadenopathy:     Cervical: No cervical adenopathy.  Skin:    General: Skin is warm and dry.     Capillary Refill: Capillary refill takes less than 2 seconds.     Coloration: Skin is not pale.     Findings: No bruising or rash.  Neurological:     Mental Status: She is alert. Mental status is at baseline.     Cranial Nerves: No cranial nerve deficit.     Coordination: Coordination normal.     Deep Tendon Reflexes: Reflexes are normal and symmetric. Reflexes normal.  Psychiatric:        Mood and Affect: Mood normal. Affect is blunt.     Comments: Mildly blunted affect baseline Not confused or agitated Answers questions appropriately   Cognition is mildly slowed            Assessment & Plan:   Problem List Items Addressed This Visit      Cardiovascular and Mediastinum   Orthostatic hypotension    This is intermittent and can even occur in setting of hypertensive urgency  Reviewed hospital records, lab results and studies in detail   Reassuring vitals and exam today  Family is encouraging fluid intake  Walking with walker and assistance (home care is visiting) , also rec a palliative care visit  Holding amlodipine for bp over 145/90 or worse  Continue to watch Low threshold for cardiology visit if needed        Relevant Medications    amLODipine (NORVASC) 2.5 MG tablet   Hypertensive urgency - Primary    Reviewed hospital records, lab results and studies in detail  This resolved with 2.5 mg of amlodipine (then held at [facility] home for low bp Will continue to watch  Holding amlodipine to give prn for bp over 145/90  Cautious with hx of orthostatic hypotension  No evidence of cva or tia this admit  Studies are reassuring      Relevant Medications   amLODipine (NORVASC) 2.5 MG tablet     Genitourinary   Frequent UTI/interstitial cystitis    Clear UA this hospitalization  No symptoms currently        Other   Hyperlipidemia    Disc goals for lipids and reasons to control them Rev last labs with pt Rev low sat fat diet in detail Intolerant to statins Does have vascular dz at age 45      Relevant Medications   amLODipine (NORVASC) 2.5 MG tablet   Mobility impaired    S/p hospitalization -home PT is helping Continues to use walker at all times No falls since coming home       Palliative care status    Had had visit with palliative care to discuss goals and safety issues Family has found this helpful        Other Visit Diagnoses    Abdominal aortic aneurysm (AAA) without rupture (HCC)   (Chronic)     Relevant Medications   amLODipine (NORVASC) 2.5 MG tablet

## 2019-01-12 NOTE — Assessment & Plan Note (Signed)
Disc goals for lipids and reasons to control them Rev last labs with pt Rev low sat fat diet in detail Intolerant to statins Does have vascular dz at age 85

## 2019-01-12 NOTE — Telephone Encounter (Signed)
Please ok those verbal orders  

## 2019-01-12 NOTE — Telephone Encounter (Signed)
Patient's daughter called stating certirizine 10mg  still has not went through to the pharmacy, the pharmacy told her to call us. Please advise    [facility]

## 2019-01-12 NOTE — Patient Outreach (Signed)
[facility]) Care Management  [place]  01/12/2019  Ashlee Reyes [birth year] [id number]   Reason for call: medication management & assistance  Unsuccessful telephone call attempt #1 to patient.   HIPAA compliant voicemail left requesting a return call on Linda's (daughter's VM)  Plan:  I will make another outreach attempt to patient within 3-4 business days   Regina Eck, PharmD, [place]  [phone]

## 2019-01-12 NOTE — Assessment & Plan Note (Signed)
S/p hospitalization -home PT is helping Continues to use walker at all times No falls since coming home

## 2019-01-12 NOTE — Assessment & Plan Note (Signed)
Complaining much of L knee pain  Disc use of heat/ice and brace  Has had injections and walks with walker  I think a TENS unit is reasonable if this helped her

## 2019-01-12 NOTE — Patient Outreach (Signed)
[facility]) Care Management  01/12/2019  Ashlee Reyes 1921-01-07 [id number]   Phone call to patient's daughter to follow up on patient's transportation needs. Per patient's daughter, "we are okay for right now". Per patient's daughter, patient is not in a wheelchair and can transfer in and out of her car. Per patient's daughter, because she can do that she is confident that she will be able to transport patient on her own to her medical appointments.  This Education officer, museum did discuss Engineer, structural as a resource and inquired if patient's daughter would like this Education officer, museum to refer patient to this program as a back up plan. However patient's daughter declined. The contact number to Transportation and Mobility Services provided to patient's daughter if needed in the future. Patient's daughter also provided with this social worker's contact information if needed in the future. This Education officer, museum will sign off at this time.    Sheralyn Boatman [facility] Care Management [phone]

## 2019-01-12 NOTE — Telephone Encounter (Signed)
It looks like Ashlee Reyes has sent in rx at 3:06pm today

## 2019-01-12 NOTE — Telephone Encounter (Signed)
Ashlee Reyes with [facility] advised

## 2019-01-12 NOTE — Patient Instructions (Signed)
I will sign an order for TENS unit for knee if the therapists want to send it   Give amlodipine if blood pressure is above 145/90  Otherwise hold it   Keep [place] posted (if hypotension re occurs)  Make sure to stay hydrated to prevent this

## 2019-01-12 NOTE — Assessment & Plan Note (Signed)
Reviewed hospital records, lab results and studies in detail  This resolved with 2.5 mg of amlodipine (then held at [facility] home for low bp Will continue to watch  Holding amlodipine to give prn for bp over 145/90  Cautious with hx of orthostatic hypotension  No evidence of cva or tia this admit  Studies are reassuring

## 2019-01-13 ENCOUNTER — Telehealth: Payer: Self-pay | Admitting: *Deleted

## 2019-01-13 DIAGNOSIS — I10 Essential (primary) hypertension: Secondary | ICD-10-CM | POA: Diagnosis not present

## 2019-01-13 DIAGNOSIS — G8929 Other chronic pain: Secondary | ICD-10-CM | POA: Diagnosis not present

## 2019-01-13 DIAGNOSIS — M48061 Spinal stenosis, lumbar region without neurogenic claudication: Secondary | ICD-10-CM | POA: Diagnosis not present

## 2019-01-13 DIAGNOSIS — I951 Orthostatic hypotension: Secondary | ICD-10-CM | POA: Diagnosis not present

## 2019-01-13 DIAGNOSIS — S32010D Wedge compression fracture of first lumbar vertebra, subsequent encounter for fracture with routine healing: Secondary | ICD-10-CM | POA: Diagnosis not present

## 2019-01-13 DIAGNOSIS — M1712 Unilateral primary osteoarthritis, left knee: Secondary | ICD-10-CM | POA: Diagnosis not present

## 2019-01-13 NOTE — Telephone Encounter (Signed)
Please ok those verbal orders  

## 2019-01-13 NOTE — Telephone Encounter (Signed)
Minda a Speech therapist is calling requesting verbal orders for speech for cognitive communication deficit. She would like a verbal order for speech: Twice a week for 3 weeks

## 2019-01-14 DIAGNOSIS — G8929 Other chronic pain: Secondary | ICD-10-CM | POA: Diagnosis not present

## 2019-01-14 DIAGNOSIS — S32010D Wedge compression fracture of first lumbar vertebra, subsequent encounter for fracture with routine healing: Secondary | ICD-10-CM | POA: Diagnosis not present

## 2019-01-14 DIAGNOSIS — M1712 Unilateral primary osteoarthritis, left knee: Secondary | ICD-10-CM | POA: Diagnosis not present

## 2019-01-14 DIAGNOSIS — I951 Orthostatic hypotension: Secondary | ICD-10-CM | POA: Diagnosis not present

## 2019-01-14 DIAGNOSIS — I10 Essential (primary) hypertension: Secondary | ICD-10-CM | POA: Diagnosis not present

## 2019-01-14 DIAGNOSIS — M48061 Spinal stenosis, lumbar region without neurogenic claudication: Secondary | ICD-10-CM | POA: Diagnosis not present

## 2019-01-14 NOTE — Telephone Encounter (Signed)
Left VM giving Minda verbal orders

## 2019-01-15 ENCOUNTER — Other Ambulatory Visit: Payer: Self-pay | Admitting: Pharmacist

## 2019-01-15 ENCOUNTER — Ambulatory Visit: Payer: Self-pay | Admitting: Pharmacist

## 2019-01-16 ENCOUNTER — Telehealth: Payer: Self-pay

## 2019-01-16 DIAGNOSIS — I951 Orthostatic hypotension: Secondary | ICD-10-CM | POA: Diagnosis not present

## 2019-01-16 DIAGNOSIS — I10 Essential (primary) hypertension: Secondary | ICD-10-CM | POA: Diagnosis not present

## 2019-01-16 DIAGNOSIS — S32010D Wedge compression fracture of first lumbar vertebra, subsequent encounter for fracture with routine healing: Secondary | ICD-10-CM | POA: Diagnosis not present

## 2019-01-16 DIAGNOSIS — M1712 Unilateral primary osteoarthritis, left knee: Secondary | ICD-10-CM | POA: Diagnosis not present

## 2019-01-16 DIAGNOSIS — G8929 Other chronic pain: Secondary | ICD-10-CM | POA: Diagnosis not present

## 2019-01-16 DIAGNOSIS — M48061 Spinal stenosis, lumbar region without neurogenic claudication: Secondary | ICD-10-CM | POA: Diagnosis not present

## 2019-01-16 NOTE — Patient Outreach (Signed)
[name]) Care Management [place]  01/16/2019  Ashlee Reyes 1921/06/08 [id number]  Reason for referral: medication management/assistance  [facility] pharmacy case is being closed due to the following reasons:  Successful outreach to [name] (patient's daughter).  She states that the patient is doing well and they do not require pharmacy assistance at this time.  Patient has been provided [facility] CM contact information if assistance needed in the future.    Thank you for allowing [facility] pharmacy to be involved in this patient's care.    Regina Eck, PharmD, [place]  [phone]

## 2019-01-16 NOTE — Telephone Encounter (Signed)
Patient's daughter contacted palliative care to discuss patient having breakthrough pain yesterday and wondering if patient could possibly take another 1/2 tab of tramadol.  Daughter reports patient takes 1/2 in the morning and 1 pill at night for the chronic shoulder pain. RN sent electronic message to Dr. Glori Bickers for advice.  Per dr. Glori Bickers, ok to take another 1/2 if pain continues.  RN contacted daughter back and she said that they alternated heat and ice and pain was relieved.  Instructed daughter to contact Dr. Marliss Coots office if breakthrough pain continues to be an issue.  Daughter Ashlee Reyes verbalized understanding and will call us if needed.

## 2019-01-19 DIAGNOSIS — M1712 Unilateral primary osteoarthritis, left knee: Secondary | ICD-10-CM | POA: Diagnosis not present

## 2019-01-19 DIAGNOSIS — M48061 Spinal stenosis, lumbar region without neurogenic claudication: Secondary | ICD-10-CM | POA: Diagnosis not present

## 2019-01-19 DIAGNOSIS — G8929 Other chronic pain: Secondary | ICD-10-CM | POA: Diagnosis not present

## 2019-01-19 DIAGNOSIS — S32010D Wedge compression fracture of first lumbar vertebra, subsequent encounter for fracture with routine healing: Secondary | ICD-10-CM | POA: Diagnosis not present

## 2019-01-19 DIAGNOSIS — I951 Orthostatic hypotension: Secondary | ICD-10-CM | POA: Diagnosis not present

## 2019-01-20 DIAGNOSIS — M1712 Unilateral primary osteoarthritis, left knee: Secondary | ICD-10-CM | POA: Diagnosis not present

## 2019-01-20 DIAGNOSIS — I951 Orthostatic hypotension: Secondary | ICD-10-CM | POA: Diagnosis not present

## 2019-01-20 DIAGNOSIS — M48061 Spinal stenosis, lumbar region without neurogenic claudication: Secondary | ICD-10-CM | POA: Diagnosis not present

## 2019-01-20 DIAGNOSIS — I10 Essential (primary) hypertension: Secondary | ICD-10-CM | POA: Diagnosis not present

## 2019-01-20 DIAGNOSIS — S32010D Wedge compression fracture of first lumbar vertebra, subsequent encounter for fracture with routine healing: Secondary | ICD-10-CM | POA: Diagnosis not present

## 2019-01-20 DIAGNOSIS — G8929 Other chronic pain: Secondary | ICD-10-CM | POA: Diagnosis not present

## 2019-01-21 ENCOUNTER — Other Ambulatory Visit: Payer: Self-pay | Admitting: Allergy and Immunology

## 2019-01-22 DIAGNOSIS — S32010D Wedge compression fracture of first lumbar vertebra, subsequent encounter for fracture with routine healing: Secondary | ICD-10-CM | POA: Diagnosis not present

## 2019-01-22 DIAGNOSIS — M48061 Spinal stenosis, lumbar region without neurogenic claudication: Secondary | ICD-10-CM | POA: Diagnosis not present

## 2019-01-22 DIAGNOSIS — G8929 Other chronic pain: Secondary | ICD-10-CM | POA: Diagnosis not present

## 2019-01-22 DIAGNOSIS — I951 Orthostatic hypotension: Secondary | ICD-10-CM | POA: Diagnosis not present

## 2019-01-22 DIAGNOSIS — I10 Essential (primary) hypertension: Secondary | ICD-10-CM | POA: Diagnosis not present

## 2019-01-22 DIAGNOSIS — M1712 Unilateral primary osteoarthritis, left knee: Secondary | ICD-10-CM | POA: Diagnosis not present

## 2019-01-23 DIAGNOSIS — G8929 Other chronic pain: Secondary | ICD-10-CM | POA: Diagnosis not present

## 2019-01-23 DIAGNOSIS — M48061 Spinal stenosis, lumbar region without neurogenic claudication: Secondary | ICD-10-CM | POA: Diagnosis not present

## 2019-01-23 DIAGNOSIS — S32010D Wedge compression fracture of first lumbar vertebra, subsequent encounter for fracture with routine healing: Secondary | ICD-10-CM | POA: Diagnosis not present

## 2019-01-23 DIAGNOSIS — I10 Essential (primary) hypertension: Secondary | ICD-10-CM | POA: Diagnosis not present

## 2019-01-23 DIAGNOSIS — M1712 Unilateral primary osteoarthritis, left knee: Secondary | ICD-10-CM | POA: Diagnosis not present

## 2019-01-23 DIAGNOSIS — I951 Orthostatic hypotension: Secondary | ICD-10-CM | POA: Diagnosis not present

## 2019-01-26 DIAGNOSIS — M1712 Unilateral primary osteoarthritis, left knee: Secondary | ICD-10-CM | POA: Diagnosis not present

## 2019-01-26 DIAGNOSIS — I951 Orthostatic hypotension: Secondary | ICD-10-CM | POA: Diagnosis not present

## 2019-01-26 DIAGNOSIS — M48061 Spinal stenosis, lumbar region without neurogenic claudication: Secondary | ICD-10-CM | POA: Diagnosis not present

## 2019-01-26 DIAGNOSIS — G8929 Other chronic pain: Secondary | ICD-10-CM | POA: Diagnosis not present

## 2019-01-26 DIAGNOSIS — S32010D Wedge compression fracture of first lumbar vertebra, subsequent encounter for fracture with routine healing: Secondary | ICD-10-CM | POA: Diagnosis not present

## 2019-01-26 DIAGNOSIS — I10 Essential (primary) hypertension: Secondary | ICD-10-CM | POA: Diagnosis not present

## 2019-01-27 ENCOUNTER — Telehealth: Payer: Self-pay

## 2019-01-27 ENCOUNTER — Other Ambulatory Visit: Payer: Medicare Other

## 2019-01-27 NOTE — Telephone Encounter (Signed)
Pt notified of Dr. Marliss Coots comments and verbalized understanding. She will keep an eye on it and only give med PRN but if BP continues to remain elevated [name] will update [place]

## 2019-01-27 NOTE — Telephone Encounter (Signed)
[facility] Patient Name: Ashlee Reyes Gender: Female DOB: 1921/06/10 Age: 85 Y 5 M 27 D Return Phone Number: [phone] (Primary), [phone] (Secondary) Address[place] [address] Client [place] [facility] - Client Client Site [facility] Tower, Roque Lias - MD Contact Type Call Who Is Calling Patient / Member / Family / Caregiver Call Type Triage / Clinical Caller Name [name] Relationship To Patient Daughter Return Phone Number [phone] (Primary) Chief Complaint Blood Pressure High Reason for Call Symptomatic / Request for [place] states she has a question about her mothers BP RX. BP is 171/103 Translation No Nurse Assessment Nurse: Pearletha Alfred, RN, Kaila Date/Time (Eastern Time): 01/26/2019 5:16:37 PM Confirm and document reason for call. If symptomatic, describe symptoms. ---Caller states she has a question about her mothers BP RX. BP is 171/103- recheck is now 172/122. Daughter gave her a new medication for the first time- its a new prescription. Amlodipine 2.5mg  take one by mouth daily as needed for elevated BP. Medication was given at 4:55pm. Patient had a nose bleed. No symptoms. Has the patient traveled to [place] OR had close contact with a person known to have the novel coronavirus illness in the last 14 days? ---No Does the patient have any new or worsening symptoms? ---Yes Will a triage be completed? ---Yes Related visit to physician within the last 2 weeks? ---Yes Does the PT have any chronic conditions? (i.e. diabetes, asthma, this includes High risk factors for pregnancy, etc.) ---Yes List chronic conditions. ---asthma Is this a behavioral health or substance abuse call? ---No Guidelines Guideline Title Affirmed Question Affirmed Notes Nurse Date/Time  (Eastern Time) High Blood Pressure Systolic BP >= 026 OR Diastolic >= 378 Swank, RN, Kaila 01/26/2019 5:19:48 PM Disp. Time Eilene Ghazi Time) Disposition Final User 01/26/2019 5:22:00 PM SEE PCP WITHIN 3 DAYS Yes Swank, RN, Kaila PLEASE NOTE: All timestamps contained within this report are represented as [place] Standard Time. CONFIDENTIALTY NOTICE: This fax transmission is intended only for the addressee. It contains information that is legally privileged, confidential or otherwise protected from use or disclosure. If you are not the intended recipient, you are strictly prohibited from reviewing, disclosing, copying using or disseminating any of this information or taking any action in reliance on or regarding this information. If you have received this fax in error, please notify us immediately by telephone so that we can arrange for its return to [place]. Phone: [phone], Toll-Free: [phone], Fax: [phone] Page: 2 of 2 Call Id: [id number] [place] Disagree/Comply Comply Caller Understands Yes PreDisposition Call Doctor Care Advice Given Per Guideline SEE PCP WITHIN 3 DAYS: * You need to be seen within 2 or 3 days. Call your doctor (or NP/PA) during regular office hours and make an appointment. A clinic or urgent care center are good places to go for care if your doctor's office is closed or you can't get an appointment. NOTE: If office will be open tomorrow, tell caller to call then, not in 3 days. HIGH BLOOD PRESSURE: * Untreated high blood pressure may cause damage to your heart, brain, kidneys, and eyes. * Treatment of high blood pressure can reduce the risk of stroke, heart attack, and heart failure. * The goal of blood pressure treatment for most people with hypertension is to keep the blood pressure under 140/90. For people that are 37 years or older, your doctor may instead  want to keep the blood pressure under 150/90. CALL BACK IF: * Weakness or numbness of the face, arm or leg on  one side of the body occurs * Difficulty walking, difficulty talking, or severe headache occurs * Chest pain or difficulty breathing occurs * Your blood pressure is over 180/110 * You become worse. CARE ADVICE given per High Blood Pressure (Adult) guideline.

## 2019-01-27 NOTE — Telephone Encounter (Signed)
Please see how she is doing/ how bp is and if she has had any more nose bleeds Thanks

## 2019-01-27 NOTE — Telephone Encounter (Signed)
That sounds better  I don't think she needs it today  Thanks

## 2019-01-27 NOTE — Telephone Encounter (Signed)
Pt last seen 01/12/19.Please advise.

## 2019-01-27 NOTE — Telephone Encounter (Signed)
No additional nose bleeds. Daughter took her BP at 12:40pm today in her right wrist it was 153/84, so she took it 5 min later on her left wrist at 12:45pm and it was 143/82, pulse 79, pt has no dizziness, no HA, no other sxs, she's eating normally, and urinating normally. daughter said she did give her the amlodipine yesterday and that was the 1st and only time she has given med to her but daughter didn't know if she needs to start giving it to her on a regular basis or PRN or should she give her another dose of amlodipine today, please advise

## 2019-01-28 ENCOUNTER — Ambulatory Visit (INDEPENDENT_AMBULATORY_CARE_PROVIDER_SITE_OTHER): Payer: Medicare Other

## 2019-01-28 ENCOUNTER — Ambulatory Visit (INDEPENDENT_AMBULATORY_CARE_PROVIDER_SITE_OTHER): Payer: Medicare Other | Admitting: Physical Medicine and Rehabilitation

## 2019-01-28 ENCOUNTER — Encounter (INDEPENDENT_AMBULATORY_CARE_PROVIDER_SITE_OTHER): Payer: Self-pay | Admitting: Physical Medicine and Rehabilitation

## 2019-01-28 VITALS — BP 137/73 | HR 79 | Ht 67.0 in | Wt 170.0 lb

## 2019-01-28 DIAGNOSIS — M542 Cervicalgia: Secondary | ICD-10-CM | POA: Diagnosis not present

## 2019-01-28 DIAGNOSIS — M5412 Radiculopathy, cervical region: Secondary | ICD-10-CM

## 2019-01-28 DIAGNOSIS — M7918 Myalgia, other site: Secondary | ICD-10-CM

## 2019-01-28 NOTE — Progress Notes (Signed)
 .  Numeric Pain Rating Scale and Functional Assessment Average Pain 7 Pain Right Now 2 My pain is constant, sharp, dull and aching Pain is worse with: some activites Pain improves with: therapy/exercise   In the last MONTH (on 0-10 scale) has pain interfered with the following?  1. General activity like being  able to carry out your everyday physical activities such as walking, climbing stairs, carrying groceries, or moving a chair?  Rating(6)  2. Relation with others like being able to carry out your usual social activities and roles such as  activities at home, at work and in your community. Rating(6)  3. Enjoyment of life such that you have  been bothered by emotional problems such as feeling anxious, depressed or irritable?  Rating(3)

## 2019-01-29 ENCOUNTER — Encounter (INDEPENDENT_AMBULATORY_CARE_PROVIDER_SITE_OTHER): Payer: Self-pay | Admitting: Physical Medicine and Rehabilitation

## 2019-01-29 DIAGNOSIS — S32010D Wedge compression fracture of first lumbar vertebra, subsequent encounter for fracture with routine healing: Secondary | ICD-10-CM | POA: Diagnosis not present

## 2019-01-29 DIAGNOSIS — M48061 Spinal stenosis, lumbar region without neurogenic claudication: Secondary | ICD-10-CM | POA: Diagnosis not present

## 2019-01-29 DIAGNOSIS — M1712 Unilateral primary osteoarthritis, left knee: Secondary | ICD-10-CM | POA: Diagnosis not present

## 2019-01-29 DIAGNOSIS — I951 Orthostatic hypotension: Secondary | ICD-10-CM | POA: Diagnosis not present

## 2019-01-29 DIAGNOSIS — G8929 Other chronic pain: Secondary | ICD-10-CM | POA: Diagnosis not present

## 2019-01-29 DIAGNOSIS — I10 Essential (primary) hypertension: Secondary | ICD-10-CM | POA: Diagnosis not present

## 2019-01-29 NOTE — Progress Notes (Signed)
Ashlee Reyes - 85 y.o. female MRN [id number]  Date of birth: 07-12-1921  Office Visit Note: Visit Date: 01/28/2019 PCP: Abner Greenspan, MD Referred by: Tower, Wynelle Fanny, MD  Subjective: Chief Complaint  Patient presents with  . Neck - Pain  . Left Shoulder - Pain  . Left Hand - Tingling   HPI: Ashlee Reyes is a 85 y.o. female who comes in today For evaluation and management of chronic worsening recently more severe neck pain with some referral in the left trapezius and shoulder area and occasional tingling in the left hand.  Patient is a very poor historian with memory difficulties.  Her daughter is present who provides a lot of the history.  She reports recent flareup of severe neck pain with some referral in the left trapezius and shoulder area.  No specific injury.  Will also endorse tingling of the left hand.  Patient says that both hands can tingle but her daughter denies that she has been recently describing any right-sided complaints.  She denies any specific headache.  They have not noted any change in behavior any change in bowel or bladder function or weakness.  I have seen the patient on numerous occasions for lumbar stenosis.  She has not had MRI or x-ray of the cervical spine.  She did have a recent CT angiogram of the head and neck.  This was reviewed with the patient today and did not really reveal any high-grade bony stenosis although there were areas of right more than left facet arthropathy and foraminal narrowing.  X-ray of the cervical spine was performed today and reviewed below.  She has not any prior electrodiagnostic studies or history of carpal tunnel syndrome.  She is getting ongoing physical therapy at home.  They have actually gotten a TENS unit that they are going to try to use for her back and were asking today about using it for her neck.  Interestingly over the last week they have changed her position to the way she sleeps using a pillow between her  legs in a different position with her neck.  She does lay on her left side predominantly.  Since this change has happened she does feel like her neck pain has gotten better.  Review of Systems  Constitutional: Negative for chills, fever, malaise/fatigue and weight loss.  HENT: Negative for hearing loss and sinus pain.   Eyes: Negative for blurred vision, double vision and photophobia.  Respiratory: Negative for cough and shortness of breath.   Cardiovascular: Negative for chest pain, palpitations and leg swelling.  Gastrointestinal: Negative for abdominal pain, nausea and vomiting.  Genitourinary: Negative for flank pain.  Musculoskeletal: Positive for joint pain and neck pain. Negative for myalgias.  Skin: Negative for itching and rash.  Neurological: Negative for tremors, focal weakness and weakness.  Endo/Heme/Allergies: Negative.   Psychiatric/Behavioral: Positive for memory loss. Negative for depression.  All other systems reviewed and are negative.  Otherwise per HPI.  Assessment & Plan: Visit Diagnoses:  1. Cervical radiculopathy   2. Cervicalgia   3. Myofascial pain syndrome     Plan: Findings:  Chronic history of neck pain with recent severe worsening may be positional in nature how she was sleeping.  Since they have change the way she sleeps in the position she actually has improved quite a bit.  Cervical spine x-ray shows increased forward flexion without listhesis with degenerative changes at C5-6.  CT angiogram was really unrevealing other than right  more than left arthritis and foraminal narrowing.  I think the random tingling in the hand is probably a red herring and could be some level of may be mild carpal tunnel syndrome she had good strength in the hands.  At this point I think the best approach is to have the current physical therapy folks seeing her at home to look at her myofascial type pain and trigger points and continue to work with positioning.  Have also asked them  during the day that when she is not active to have her head and neck back past 90 degrees where she can have some support on the head and this would make those muscles around the spine relax.  She can also use the TENS unit if she would like and they can show her how to do that and I did write a prescription for for physical therapy to show him how to use the TENS unit and to work with her myofascial pain.    Meds & Orders: No orders of the defined types were placed in this encounter.   Orders Placed This Encounter  Procedures  . XR Cervical Spine 2 or 3 views    Follow-up: Return if symptoms worsen or fail to improve.   Procedures: No procedures performed  No notes on file   Clinical History: CT ABD 01/01/2018  Musculoskeletal: There is mild anterolisthesis of L4 on L5, stable. There is also slight anterolisthesis of L3 on L4, stable. There is multifocal osteoarthritic change in the lumbar spine. There is severe spinal stenosis at L3-4 and L4-5 due to spondylolisthesis, disc protrusion and bony hypertrophy at these levels. Moderate spinal stenosis is noted at L2-3 due to disc protrusion and bony hypertrophy. No blastic or lytic bone lesions are evident. There is no intramuscular or abdominal wall lesion.  IMPRESSION: 1. Multiple sigmoid diverticula without diverticulitis. No bowel obstruction. No abscess. No periappendiceal region inflammation.  2. Status post aorto bi-iliac stent for aneurysm. Aorta has a current maximum transverse diameter 3.0 x 2.8 cm. There is extensive aortic and iliac artery atherosclerosis. There is also coronary artery calcification. Major mesenteric vessels appear patent.  3. Severe spinal stenosis at L3-4 and L4-5, multifactorial. Moderate spinal stenosis at L2-3.   She reports that she has never smoked. She has never used smokeless tobacco.  Recent Labs    12/26/18 0700  HGBA1C 5.6    Objective:  VS:  HT:5\' 7"  (170.2 cm)   WT:170 lb (77.1  kg)  BMI:26.62    BP:137/73  HR:79bpm  TEMP: ( )  RESP:  Physical Exam Vitals signs and nursing note reviewed.  Constitutional:      General: She is not in acute distress.    Appearance: Normal appearance. She is well-developed. She is obese. She is not ill-appearing.  HENT:     Head: Normocephalic and atraumatic.  Eyes:     Conjunctiva/sclera: Conjunctivae normal.     Pupils: Pupils are equal, round, and reactive to light.  Neck:     Musculoskeletal: Normal range of motion and neck supple. Muscular tenderness present. No neck rigidity.  Cardiovascular:     Rate and Rhythm: Normal rate.     Pulses: Normal pulses.  Pulmonary:     Effort: Pulmonary effort is normal.  Musculoskeletal:     Right lower leg: No edema.     Left lower leg: No edema.     Comments: Patient ambulates with walker.  She has a forward flexed cervical spine she has  some pain at full extension with negative Spurling's test she has positive trigger points in the trapezius levator scapula and supraspinatus.  She has more trigger point on the left than the right.  She has some pain with the shoulders at end ranges but pretty good range of motion of the shoulders bilaterally no real impingement.  She has good strength in the upper extremities with wrist extension long finger flexion and APB without any atrophy noted.  Skin:    General: Skin is warm and dry.     Findings: No erythema or rash.  Neurological:     General: No focal deficit present.     Mental Status: She is alert and oriented to person, place, and time.     Sensory: No sensory deficit.     Motor: No abnormal muscle tone.     Coordination: Coordination normal.     Gait: Gait normal.  Psychiatric:        Mood and Affect: Mood normal.        Behavior: Behavior normal.     Ortho Exam Imaging: 01/28/2019 AP and lateral cervical spine x-ray dated today shows increased forward flexion with good lordosis without any listhesis.  There is multilevel facet  arthropathy with mainly degenerative disc type height loss at see 5-6 and C4-5.  No concerning fractures or dislocations or other lesions.  Past Medical/Family/Surgical/Social History: Medications & Allergies reviewed per EMR, new medications updated. Patient Active Problem List   Diagnosis Date Noted  . Palliative care status 01/12/2019  . Hypertensive urgency 12/24/2018  . Compression fracture of L1 lumbar vertebra (HCC) 09/26/2018  . Mobility impaired 09/26/2018  . History of endovascular stent graft for abdominal aortic aneurysm (AAA) 01/01/2018  . Unilateral primary osteoarthritis, left knee 07/11/2017  . Hyperlipidemia 03/12/2017  . Frequent UTI/interstitial cystitis 02/06/2016  . Hypotension 01/09/2016  . Carotid stenosis 06/01/2015  . Local reaction to immunization 02/21/2015  . Encounter for Medicare annual wellness exam 02/15/2015  . Estrogen deficiency 02/15/2015  . Sinusitis, chronic 01/12/2015  . IBS (irritable bowel syndrome) 01/12/2015  . Orthostatic hypotension 01/11/2015  . Palpitations 05/13/2014  . Second degree heart block 10/18/2013  . Syncope 10/17/2013  . Aneurysm of abdominal vessel ([place]) 06/25/2013  . Osteopenia 01/13/2013  . CAD (coronary artery disease)   . Chronic depression 06/03/2007  . GERD 06/02/2007  . Asthma   . Chronic interstitial cystitis   . Osteoarthritis   . Spinal stenosis of lumbar region   . History of urinary tract infection    Past Medical History:  Diagnosis Date  . AAA (abdominal aortic aneurysm) ([place])    followed by Dr Oneida Alar. Last u/s07/10 stable AAA with largest measurement 3.97 x 4.02cm.  . Allergic rhinitis, seasonal   . Asthma   . Atrial fibrillation ([place])   . CAD (coronary artery disease) 07/2009   mod by cath 08/10  . Complication of anesthesia   . Depression   . Disc disease, degenerative, lumbar or lumbosacral    SPINAL STENOSIS  . Diverticulosis   . GERD (gastroesophageal reflux disease)   . Hemorrhoids   .  History of recurrent UTIs   . Hyperlipidemia   . IBS (irritable bowel syndrome)   . IC (irritable colon)    DMSO in past  . Interstitial cystitis   . Osteoarthritis    spine/spinal stenosis  . Osteopenia 2011  . PONV (postoperative nausea and vomiting)   . Skin cancer    basal cell cancer - face.  Family History  Problem Relation Age of Onset  . Heart disease Mother        deceased age 62 heart attack.  Marland Kitchen Heart attack Mother   . Other Mother        varicose veins  . Varicose Veins Mother   . Heart disease Father        age 28 heart attack  . Cancer Father   . Stroke Brother   . Cancer Brother   . Heart disease Brother   . Heart attack Brother   . Cancer Daughter   . Diabetes Son    Past Surgical History:  Procedure Laterality Date  . ABDOMINAL AORTIC ENDOVASCULAR STENT GRAFT N/A 07/01/2013   Procedure: ABDOMINAL AORTIC ENDOVASCULAR STENT GRAFT- GORE;  Surgeon: Elam Dutch, MD;  Location: [facility];  Service: Vascular;  Laterality: N/A;  Ultrasound guided  . ABDOMINAL HYSTERECTOMY     partial not cancer  . BREAST SURGERY     lumpectomy  . CYSTOCELE REPAIR    . ESOPHAGOGASTRODUODENOSCOPY  09/1998   erosive esphagitis ? stricture treated 12/2002  . HERNIA REPAIR  57/4734   umbilical hernia  . RECTOCELE REPAIR    . REFRACTIVE SURGERY     for film after cataract surgery.  Marland Kitchen RIGHT COLECTOMY  1997   benign  . SKIN LESION EXCISION  05/2011   basal cell skin lesion removed.   Social History   Occupational History  . Not on file  Tobacco Use  . Smoking status: Never Smoker  . Smokeless tobacco: Never Used  Substance and Sexual Activity  . Alcohol use: No    Alcohol/week: 0.0 standard drinks  . Drug use: No  . Sexual activity: Not Currently

## 2019-02-03 DIAGNOSIS — S32010D Wedge compression fracture of first lumbar vertebra, subsequent encounter for fracture with routine healing: Secondary | ICD-10-CM | POA: Diagnosis not present

## 2019-02-03 DIAGNOSIS — I951 Orthostatic hypotension: Secondary | ICD-10-CM | POA: Diagnosis not present

## 2019-02-03 DIAGNOSIS — M1712 Unilateral primary osteoarthritis, left knee: Secondary | ICD-10-CM | POA: Diagnosis not present

## 2019-02-03 DIAGNOSIS — G8929 Other chronic pain: Secondary | ICD-10-CM | POA: Diagnosis not present

## 2019-02-03 DIAGNOSIS — I10 Essential (primary) hypertension: Secondary | ICD-10-CM | POA: Diagnosis not present

## 2019-02-03 DIAGNOSIS — M48061 Spinal stenosis, lumbar region without neurogenic claudication: Secondary | ICD-10-CM | POA: Diagnosis not present

## 2019-02-04 ENCOUNTER — Other Ambulatory Visit: Payer: Medicare Other

## 2019-02-04 DIAGNOSIS — Z515 Encounter for palliative care: Secondary | ICD-10-CM

## 2019-02-04 NOTE — Progress Notes (Signed)
PATIENT NAME: Ashlee Reyes DOB: 12/14/1921 MRN: [id number]  PRIMARY CARE PROVIDER: Abner Greenspan, MD  RESPONSIBLE PARTY:  Acct ID - Guarantor Home Phone Work Phone Relationship Acct Type  [id number] Ashlee Reyes, Ashlee [phone]  Self P/F     [address], [place], [place] [address]    PLAN OF CARE and INTERVENTIONS:               1.  GOALS OF CARE/ ADVANCE CARE PLANNING:  Remain in home with daughter without infection.  Remain safe in the home without falls.                  2.  PATIENT/CAREGIVER EDUCATION:  Education on signs and symptoms of infections and CHF (weight gain, shortness of breath, cough)                4. PERSONAL EMERGENCY PLAN:  In Place               5.  DISEASE STATUS:Worsening memory issues    HISTORY OF PRESENT ILLNESS:    CODE STATUS: DNR  ADVANCED DIRECTIVES: Y MOST FORM: No PPS: 40%   PHYSICAL EXAM:   VITALS:See Vital signs  LUNGS: clear to auscultation , decreased breath sounds CARDIAC: Cor RRR  EXTREMITIES: 1+ edema SKIN: Skin color, texture, turgor normal. No rashes or lesions or mobility and turgor normal and no evidence of bleeding or bruising  NEURO: positive for memory problems   Palliative Care home visit to patient's home.  Patient's daughter Ashlee Reyes cares for patient.  Daughter reports patient was hospitalized in January then went to [facility] home.  Patient returned home on January 24th and received PT and speech through home health agency.  Patient continues to ambulate with her walker.  Patient has had a 7-8 pound weight loss since December.  Daughter reports patient is not complaining of pain as frequently.  Daughter reports patient is eating well.  Patient continues to have a non productive cough.  Daughter states patient's memory is worsening. Patient has has no med changes other than daughter discontinued Wellbutrin that was started while patient was at the nursing home.  Daughter encouraged to contact Palliative Care with questions  or concerns.                    Nilda Simmer, RN

## 2019-02-05 ENCOUNTER — Ambulatory Visit (INDEPENDENT_AMBULATORY_CARE_PROVIDER_SITE_OTHER): Payer: Self-pay | Admitting: Physical Medicine and Rehabilitation

## 2019-02-05 DIAGNOSIS — M1712 Unilateral primary osteoarthritis, left knee: Secondary | ICD-10-CM | POA: Diagnosis not present

## 2019-02-05 DIAGNOSIS — G8929 Other chronic pain: Secondary | ICD-10-CM | POA: Diagnosis not present

## 2019-02-05 DIAGNOSIS — S32010D Wedge compression fracture of first lumbar vertebra, subsequent encounter for fracture with routine healing: Secondary | ICD-10-CM | POA: Diagnosis not present

## 2019-02-05 DIAGNOSIS — I951 Orthostatic hypotension: Secondary | ICD-10-CM | POA: Diagnosis not present

## 2019-02-05 DIAGNOSIS — M48061 Spinal stenosis, lumbar region without neurogenic claudication: Secondary | ICD-10-CM | POA: Diagnosis not present

## 2019-02-05 DIAGNOSIS — I10 Essential (primary) hypertension: Secondary | ICD-10-CM | POA: Diagnosis not present

## 2019-02-06 ENCOUNTER — Telehealth: Payer: Self-pay | Admitting: *Deleted

## 2019-02-06 DIAGNOSIS — S32010D Wedge compression fracture of first lumbar vertebra, subsequent encounter for fracture with routine healing: Secondary | ICD-10-CM | POA: Diagnosis not present

## 2019-02-06 DIAGNOSIS — G8929 Other chronic pain: Secondary | ICD-10-CM | POA: Diagnosis not present

## 2019-02-06 DIAGNOSIS — I10 Essential (primary) hypertension: Secondary | ICD-10-CM | POA: Diagnosis not present

## 2019-02-06 DIAGNOSIS — M48061 Spinal stenosis, lumbar region without neurogenic claudication: Secondary | ICD-10-CM | POA: Diagnosis not present

## 2019-02-06 DIAGNOSIS — M1712 Unilateral primary osteoarthritis, left knee: Secondary | ICD-10-CM | POA: Diagnosis not present

## 2019-02-06 DIAGNOSIS — I951 Orthostatic hypotension: Secondary | ICD-10-CM | POA: Diagnosis not present

## 2019-02-06 NOTE — Telephone Encounter (Signed)
PA for pt's nexium was done at www.covermymeds.com and it was instantly approved. I will await their approval faxed letter to send to pharmacy

## 2019-02-07 ENCOUNTER — Other Ambulatory Visit: Payer: Self-pay | Admitting: Allergy and Immunology

## 2019-02-07 DIAGNOSIS — J3089 Other allergic rhinitis: Secondary | ICD-10-CM

## 2019-02-11 ENCOUNTER — Telehealth: Payer: Self-pay | Admitting: Allergy and Immunology

## 2019-02-11 ENCOUNTER — Other Ambulatory Visit: Payer: Self-pay

## 2019-02-11 DIAGNOSIS — J3089 Other allergic rhinitis: Secondary | ICD-10-CM

## 2019-02-11 MED ORDER — CETIRIZINE HCL 10 MG PO TABS: ORAL_TABLET | ORAL | 0 refills | Status: DC

## 2019-02-11 NOTE — Telephone Encounter (Signed)
Refill sent in to [facility] in [place].

## 2019-02-11 NOTE — Telephone Encounter (Addendum)
Pt daughter called and made her appointment for march 31,2020. And need to have Zyrtec called into [facility] [place], [address] [phone].

## 2019-02-16 ENCOUNTER — Other Ambulatory Visit: Payer: Self-pay | Admitting: Family Medicine

## 2019-02-16 NOTE — Telephone Encounter (Signed)
Pt has CPE scheduled for 07/31/2019, this Rx was given to pt in the hospital on 12/26/18 #20 tabs with 0 refills

## 2019-02-19 ENCOUNTER — Telehealth: Payer: Self-pay

## 2019-02-19 NOTE — Telephone Encounter (Signed)
Patient's daughter is calling stating her mother has been clearing her throat for several days. She was concern so she was administering her albuterol more often. I ask daughter if she looked under distress and having any sx of cyanosis or dyspnea. She stated she takes all her medication including her Nexium, but not the Ranitidine due to the recall concerns. Patient was wondering if we need to switch to Famotidine due to recall. I advise patient daughter tablets are okay and not capsuals. Patient saturation remains at 94% and was advise if she is having any distress she should go to the ER. Patient has an appt on 03/17/2019.

## 2019-02-19 NOTE — Telephone Encounter (Signed)
Can use either famotidine 40 mg or ranitidine 300 mg.

## 2019-02-20 ENCOUNTER — Telehealth: Payer: Self-pay | Admitting: *Deleted

## 2019-02-20 ENCOUNTER — Telehealth: Payer: Self-pay

## 2019-02-20 ENCOUNTER — Ambulatory Visit (INDEPENDENT_AMBULATORY_CARE_PROVIDER_SITE_OTHER): Payer: Medicare Other | Admitting: Family Medicine

## 2019-02-20 ENCOUNTER — Encounter: Payer: Self-pay | Admitting: Family Medicine

## 2019-02-20 VITALS — BP 136/78 | HR 87 | Temp 98.2°F | Ht 66.0 in | Wt 171.3 lb

## 2019-02-20 DIAGNOSIS — K219 Gastro-esophageal reflux disease without esophagitis: Secondary | ICD-10-CM | POA: Diagnosis not present

## 2019-02-20 DIAGNOSIS — J301 Allergic rhinitis due to pollen: Secondary | ICD-10-CM

## 2019-02-20 DIAGNOSIS — J4541 Moderate persistent asthma with (acute) exacerbation: Secondary | ICD-10-CM

## 2019-02-20 DIAGNOSIS — J309 Allergic rhinitis, unspecified: Secondary | ICD-10-CM | POA: Insufficient documentation

## 2019-02-20 MED ORDER — FAMOTIDINE 40 MG PO TABS: 40.0000 mg | ORAL_TABLET | Freq: Every day | ORAL | 5 refills | Status: DC

## 2019-02-20 NOTE — Telephone Encounter (Signed)
error 

## 2019-02-20 NOTE — Patient Instructions (Addendum)
Continue the allergy medicines and add the pepcid (this may further help allergies as well as acid reflux when can make you feel short of breath when lying down)   Continue all your allergy and asthma medicines   I think there is a combo of post nasal drip and also reflux going on

## 2019-02-20 NOTE — Telephone Encounter (Signed)
I spoke with patient's daughter this morning. Patient is seeing her PCP this morning in reference to her symptoms. Daughter asked that famotidine 40 mg be sent in. This has been sent to pharmacy on file.

## 2019-02-20 NOTE — Telephone Encounter (Signed)
[name] said since yesterday pt is clearing her throat and feels like she is having problems getting a deep breath. No CP. Pulse ox 92-93% and one time went to 97%. BP 131/74 P94 T 98.2. Linda request pt appt; pt is not in distress trying to her breath. Scheduled appt with Dr Glori Bickers 02/20/19 at 10:30. If pt condition worsens prior to appt pt will go to ED. FYI to Dr Glori Bickers.

## 2019-02-20 NOTE — Addendum Note (Signed)
Addended by: Lucrezia Starch I on: 02/20/2019 10:01 AM   Modules accepted: Orders

## 2019-02-20 NOTE — Progress Notes (Signed)
Subjective:    Patient ID: Ashlee Reyes, female    DOB: Aug 06, 1921, 85 y.o.   MRN: [id number]  HPI  Here with c/o shortness of breath   Last night pt c/o of not being able to catch her breath  Had to sit up in her bed  Was eventually able to go back to sleep   Clears her throat a lot  No cough  No fever  Thinks it may be her allergies (was outdoors yesterday , under a cedar tree)  No nasal congestion today- unsure about last night   Not exertional  No new stress or anxiety   Needs to do her exercises more    Wt Readings from Last 3 Encounters:  02/20/19 171 lb 5 oz (77.7 kg)  02/04/19 171 lb (77.6 kg)  01/28/19 170 lb (77.1 kg)   27.65 kg/m   Pulse ox 97% on RA today  Never smoked   Albuterol hfa- ? If helps all the time  q var  flonase  zyrtec H/o asthma and chronic sinusitis and nasal allergy   Allergist is sending in some pepcid (also has reflux)   Blood pressure BP Readings from Last 3 Encounters:  02/20/19 136/78  02/04/19 132/78  01/28/19 137/73   Pulse Readings from Last 3 Encounters:  02/20/19 87  02/04/19 82  01/28/19 79   Patient Active Problem List   Diagnosis Date Noted  . Allergic rhinitis 02/20/2019  . Palliative care status 01/12/2019  . Hypertensive urgency 12/24/2018  . Compression fracture of L1 lumbar vertebra (HCC) 09/26/2018  . Mobility impaired 09/26/2018  . History of endovascular stent graft for abdominal aortic aneurysm (AAA) 01/01/2018  . Unilateral primary osteoarthritis, left knee 07/11/2017  . Hyperlipidemia 03/12/2017  . Frequent UTI/interstitial cystitis 02/06/2016  . Hypotension 01/09/2016  . Carotid stenosis 06/01/2015  . Local reaction to immunization 02/21/2015  . Encounter for Medicare annual wellness exam 02/15/2015  . Estrogen deficiency 02/15/2015  . Sinusitis, chronic 01/12/2015  . IBS (irritable bowel syndrome) 01/12/2015  . Orthostatic hypotension 01/11/2015  . Palpitations 05/13/2014  .  Second degree heart block 10/18/2013  . Syncope 10/17/2013  . Aneurysm of abdominal vessel ([place]) 06/25/2013  . Osteopenia 01/13/2013  . CAD (coronary artery disease)   . Chronic depression 06/03/2007  . GERD 06/02/2007  . Asthma   . Chronic interstitial cystitis   . Osteoarthritis   . Spinal stenosis of lumbar region   . History of urinary tract infection    Past Medical History:  Diagnosis Date  . AAA (abdominal aortic aneurysm) ([place])    followed by Dr Oneida Alar. Last u/s07/10 stable AAA with largest measurement 3.97 x 4.02cm.  . Allergic rhinitis, seasonal   . Asthma   . Atrial fibrillation ([place])   . CAD (coronary artery disease) 07/2009   mod by cath 08/10  . Complication of anesthesia   . Depression   . Disc disease, degenerative, lumbar or lumbosacral    SPINAL STENOSIS  . Diverticulosis   . GERD (gastroesophageal reflux disease)   . Hemorrhoids   . History of recurrent UTIs   . Hyperlipidemia   . IBS (irritable bowel syndrome)   . IC (irritable colon)    DMSO in past  . Interstitial cystitis   . Osteoarthritis    spine/spinal stenosis  . Osteopenia 2011  . PONV (postoperative nausea and vomiting)   . Skin cancer    basal cell cancer - face.   Past Surgical History:  Procedure  Laterality Date  . ABDOMINAL AORTIC ENDOVASCULAR STENT GRAFT N/A 07/01/2013   Procedure: ABDOMINAL AORTIC ENDOVASCULAR STENT GRAFT- GORE;  Surgeon: Elam Dutch, MD;  Location: [facility];  Service: Vascular;  Laterality: N/A;  Ultrasound guided  . ABDOMINAL HYSTERECTOMY     partial not cancer  . BREAST SURGERY     lumpectomy  . CYSTOCELE REPAIR    . ESOPHAGOGASTRODUODENOSCOPY  09/1998   erosive esphagitis ? stricture treated 12/2002  . HERNIA REPAIR  40/9735   umbilical hernia  . RECTOCELE REPAIR    . REFRACTIVE SURGERY     for film after cataract surgery.  Marland Kitchen RIGHT COLECTOMY  1997   benign  . SKIN LESION EXCISION  05/2011   basal cell skin lesion removed.   Social History    Tobacco Use  . Smoking status: Never Smoker  . Smokeless tobacco: Never Used  Substance Use Topics  . Alcohol use: No    Alcohol/week: 0.0 standard drinks  . Drug use: No   Family History  Problem Relation Age of Onset  . Heart disease Mother        deceased age 76 heart attack.  Marland Kitchen Heart attack Mother   . Other Mother        varicose veins  . Varicose Veins Mother   . Heart disease Father        age 67 heart attack  . Cancer Father   . Stroke Brother   . Cancer Brother   . Heart disease Brother   . Heart attack Brother   . Cancer Daughter   . Diabetes Son    Allergies  Allergen Reactions  . Amoxicillin-Pot Clavulanate Nausea And Vomiting    Has patient had a PCN reaction causing immediate rash, facial/tongue/throat swelling, SOB or lightheadedness with hypotension: No Has patient had a PCN reaction causing severe rash involving mucus membranes or skin necrosis: No Has patient had a PCN reaction that required hospitalization: No Has patient had a PCN reaction occurring within the last 10 years: No If all of the above answers are "NO", then may proceed with Cephalosporin use.   . Avelox [Moxifloxacin] Nausea And Vomiting  . Flovent Hfa [Fluticasone] Other (See Comments)    "makes her feel funny" per family member  . Ibuprofen Other (See Comments)    GI upset  . Keflex [Cephalexin] Other (See Comments)    abd pain / GI upset  . Nitrofurantoin Other (See Comments)    Chest pain or indigestion with nausea  . Omeprazole Nausea Only  . Prevnar [Pneumococcal 13-Val Conj Vacc] Itching    Severe local reaction with redness and itching   . Rosuvastatin Other (See Comments)    Foot pain  . Sertraline Hcl Other (See Comments)    Made her more depressed  . Tetanus Toxoids     Local redness and swelling    Current Outpatient Medications on File Prior to Visit  Medication Sig Dispense Refill  . acetaminophen (TYLENOL) 500 MG tablet Take 1 tablet (500 mg total) by mouth  every 4 (four) hours as needed for mild pain. 30 tablet 0  . albuterol (PROVENTIL HFA;VENTOLIN HFA) 108 (90 Base) MCG/ACT inhaler INHALE 2 PUFFS BY MOUTH EVERY 4 HOURS AS NEEDED FOR WHEEZING 8.5 g 0  . amLODipine (NORVASC) 2.5 MG tablet Take one pill by mouth daily as needed for elevated blood pressure (over 145/90) 30 tablet 11  . beclomethasone (QVAR REDIHALER) 80 MCG/ACT inhaler Inhale 2 puffs into the lungs 2 (  two) times daily. 1 Inhaler 0  . cetirizine (ZYRTEC) 10 MG tablet TAKE 1 TABLET BY MOUTH EVERY DAY AS NEEDED 30 tablet 0  . clopidogrel (PLAVIX) 75 MG tablet Take 1 tablet (75 mg total) by mouth daily. 30 tablet 11  . docusate sodium (COLACE) 100 MG capsule Take 1 capsule (100 mg total) by mouth every evening. 10 capsule 0  . fluticasone (FLONASE) 50 MCG/ACT nasal spray Place 2 sprays into both nostrils daily. 16 g 11  . LUMIGAN 0.01 % SOLN Place 1 drop into both eyes at bedtime.    Marland Kitchen NEXIUM 40 MG capsule TAKE ONE (1) CAPSULE BY MOUTH 2 TIMES DAILY BEFORE A MEAL (Patient taking differently: Take 40 mg by mouth See admin instructions. TAKE ONE (1) CAPSULE every morning. Takes a second dose later in the day as needed for heartburn.) 60 capsule 11  . ondansetron (ZOFRAN ODT) 4 MG disintegrating tablet Take 1 tablet (4 mg total) by mouth every 8 (eight) hours as needed for nausea or vomiting. 20 tablet 1  . Polyethyl Glycol-Propyl Glycol (SYSTANE OP) Place 1 drop into both eyes every 8 (eight) hours as needed (dry eyes).     . polyethylene glycol (MIRALAX / GLYCOLAX) packet Take 17 g by mouth daily as needed for mild constipation. 14 each 0  . Probiotic Product (ALIGN) 4 MG CAPS Take 1 capsule (4 mg total) by mouth daily.    . traMADol (ULTRAM) 50 MG tablet TAKE 1/2 TABLET BY MOUTH EVERY MORNING AND 1 AT BEDTIME 45 tablet 0  . trimethoprim (TRIMPEX) 100 MG tablet Take 1 tablet (100 mg total) by mouth daily with lunch. 30 tablet 0  . famotidine (PEPCID) 40 MG tablet Take 1 tablet (40 mg total)  by mouth daily. (Patient not taking: Reported on 02/20/2019) 30 tablet 5   No current facility-administered medications on file prior to visit.      Review of Systems  Constitutional: Negative for activity change, appetite change, fatigue, fever and unexpected weight change.  HENT: Negative for congestion, ear pain, rhinorrhea, sinus pressure and sore throat.   Eyes: Negative for pain, redness and visual disturbance.  Respiratory: Positive for shortness of breath. Negative for cough, choking, chest tightness, wheezing and stridor.        No wheezing today  Cardiovascular: Negative for chest pain, palpitations and leg swelling.  Gastrointestinal: Negative for abdominal pain, blood in stool, constipation and diarrhea.  Endocrine: Negative for polydipsia and polyuria.  Genitourinary: Negative for dysuria, frequency and urgency.  Musculoskeletal: Positive for arthralgias and back pain. Negative for myalgias.  Skin: Negative for pallor and rash.  Allergic/Immunologic: Negative for environmental allergies.  Neurological: Negative for dizziness, syncope and headaches.  Hematological: Negative for adenopathy. Does not bruise/bleed easily.  Psychiatric/Behavioral: Negative for decreased concentration and dysphoric mood. The patient is nervous/anxious.        Objective:   Physical Exam Constitutional:      General: She is not in acute distress.    Appearance: She is well-developed and normal weight. She is not ill-appearing.     Interventions: She is not intubated. HENT:     Head: Normocephalic and atraumatic.     Mouth/Throat:     Mouth: Mucous membranes are moist.     Pharynx: Oropharynx is clear. No pharyngeal swelling or oropharyngeal exudate.     Comments: Clear pnd noted  Eyes:     Conjunctiva/sclera: Conjunctivae normal.     Pupils: Pupils are equal, round, and reactive to light.  Neck:     Musculoskeletal: Normal range of motion and neck supple.     Thyroid: No thyromegaly.      Vascular: No carotid bruit or JVD.  Cardiovascular:     Rate and Rhythm: Normal rate and regular rhythm.     Heart sounds: Normal heart sounds. No gallop.   Pulmonary:     Effort: Pulmonary effort is normal. No tachypnea, accessory muscle usage or respiratory distress. She is not intubated.     Breath sounds: Normal breath sounds. No stridor. No wheezing, rhonchi or rales.     Comments: Good air exch No wheeze even on forced exp Chest:     Chest wall: No tenderness.  Abdominal:     General: Bowel sounds are normal. There is no distension or abdominal bruit.     Palpations: Abdomen is soft. There is no mass.     Tenderness: There is no abdominal tenderness.  Musculoskeletal:     Right lower leg: No edema.     Left lower leg: No edema.  Lymphadenopathy:     Cervical: No cervical adenopathy.  Skin:    General: Skin is warm and dry.     Coloration: Skin is not pale.     Findings: No rash.  Neurological:     Mental Status: She is alert. Mental status is at baseline.     Deep Tendon Reflexes: Reflexes are normal and symmetric.  Psychiatric:        Mood and Affect: Mood normal.           Assessment & Plan:   Problem List Items Addressed This Visit      Respiratory   Asthma (Chronic)    No wheezing On q var  Albuterol prn       Allergic rhinitis    Suspect pnd may have caused feeling of sob last night Enc to continue allergy medicines and allergist f/u  Elevating head of bed may also help        Digestive   GERD - Primary (Chronic)    I wonder if reflux at night may be causing throat clearing and feeling of sob  pepcid was recently px by allergist -plans to start it Update if no improvement  Also elevating head of bed may help

## 2019-02-22 NOTE — Assessment & Plan Note (Signed)
Suspect pnd may have caused feeling of sob last night Enc to continue allergy medicines and allergist f/u  Elevating head of bed may also help

## 2019-02-22 NOTE — Assessment & Plan Note (Signed)
No wheezing On q var  Albuterol prn

## 2019-02-22 NOTE — Assessment & Plan Note (Signed)
I wonder if reflux at night may be causing throat clearing and feeling of sob  pepcid was recently px by allergist -plans to start it Update if no improvement  Also elevating head of bed may help

## 2019-02-23 DIAGNOSIS — Z961 Presence of intraocular lens: Secondary | ICD-10-CM | POA: Diagnosis not present

## 2019-02-23 DIAGNOSIS — H35363 Drusen (degenerative) of macula, bilateral: Secondary | ICD-10-CM | POA: Diagnosis not present

## 2019-02-23 DIAGNOSIS — H353131 Nonexudative age-related macular degeneration, bilateral, early dry stage: Secondary | ICD-10-CM | POA: Diagnosis not present

## 2019-02-23 DIAGNOSIS — H35453 Secondary pigmentary degeneration, bilateral: Secondary | ICD-10-CM | POA: Diagnosis not present

## 2019-02-26 ENCOUNTER — Other Ambulatory Visit: Payer: Self-pay

## 2019-02-26 ENCOUNTER — Other Ambulatory Visit: Payer: Medicare Other

## 2019-02-26 DIAGNOSIS — Z515 Encounter for palliative care: Secondary | ICD-10-CM

## 2019-02-27 NOTE — Progress Notes (Signed)
COMMUNITY PALLIATIVE CARE SW NOTE  PATIENT NAME: Ashlee Reyes DOB: Apr 09, 1921 MRN: [id number]  PRIMARY CARE PROVIDER: Abner Greenspan, MD  RESPONSIBLE PARTY:  Acct ID - Guarantor Home Phone Work Phone Relationship Acct Type  [id number] HADDY, [phone]  Self P/F     [address], [place], [place] [address]     PLAN OF CARE and INTERVENTIONS:             1. GOALS OF CARE/ ADVANCE CARE PLANNING:  Patient has DNR in the home.  2. SOCIAL/EMOTIONAL/SPIRITUAL ASSESSMENT/ INTERVENTIONS:  Visit completed at patient's home with daughter Ashlee Reyes, patient, RN and SW. Patient ambulates from the bathroom to the living room with rolling walker. Daughter states patient is doing well since returning home from [facility] for rehab. Patient is pleasant and intermittently confused. She denies any concerns at this time.  3. PATIENT/CAREGIVER EDUCATION/ COPING:  Ashlee Reyes is coping well with managing patient's care while also taking care of her 2 handicapped brothers who are in two different facilities and also coordinating care for her aunt who lives across the road. She maintains much organization and remains a great advocate for patient and the others she cares for.  4. PERSONAL EMERGENCY PLAN:  Daughter will call 911 for emergencies.  5. COMMUNITY RESOURCES COORDINATION/ HEALTH CARE NAVIGATION:  Daughter plans to contact a pulmonology office to inquire about whether they would advise patient to be seen. She is not established with a lung doctor but daughter states she has developed some shortness of breath with exertion. Virgie, RN states it's likely due to patient's heart, but getting guidance from pulmonologist wouldn't hurt.  6. FINANCIAL/LEGAL CONCERNS/INTERVENTIONS:  None at this time.      SOCIAL HX:  Social History   Tobacco Use  . Smoking status: Never Smoker  . Smokeless tobacco: Never Used  Substance Use Topics  . Alcohol use: No    Alcohol/week: 0.0 standard drinks    CODE STATUS:    Code Status: Prior  ADVANCED DIRECTIVES: N MOST FORM COMPLETE:  no HOSPICE EDUCATION PROVIDED: none this visit.   SHU:OHFGBMS needs assistance with bathing and dressing but is able to toilet and feed herself.   I spent 45 minutes with patient and daughter providing support from 2:30p-3:15p.      Atha Starks

## 2019-02-27 NOTE — Progress Notes (Signed)
PATIENT NAME: Ashlee Reyes DOB: 09/06/1921 MRN: [id number]  PRIMARY CARE PROVIDER: Abner Greenspan, MD  RESPONSIBLE PARTY:  Acct ID - Guarantor Home Phone Work Phone Relationship Acct Type  [id number] Ashlee Reyes, Ashlee [phone]  Self P/F     [address], [place], [place] [address]    PLAN OF CARE and INTERVENTIONS:               1.  GOALS OF CARE/ ADVANCE CARE PLANNING:  Remain at home with daughter.                2.  PATIENT/CAREGIVER EDUCATION:  Education to daughter on signs and symptoms of infection, good handwashing to prevent infection.                3.  DISEASE STATUS: Patient is a 85 year old patient who resides with her daughter Ashlee Reyes.  Patient having worsening memory issues per daughter.  Daughter reports patient having periods of shortness of breath.     HISTORY OF PRESENT ILLNESS:    CODE STATUS: DNR  ADVANCED DIRECTIVES: Y MOST FORM: No PPS: 50%   PHYSICAL EXAM:   VITALS: Today's Vitals   02/26/19 1445  BP: 118/78  Pulse: 95  Resp: 16  Temp: 98.2 F (36.8 C)  TempSrc: Temporal  SpO2: 98%  Weight: 172 lb (78 kg)  PainSc: 0-No pain    LUNGS: negative findings: no chest deformities noted, normal respiratory rate and rhythm, clear to auscultation , decreased breath sounds CARDIAC: Cor RRR  EXTREMITIES: Trace edema SKIN: Skin color, texture, turgor normal. No rashes or lesions  NEURO: positive for memory problems   Palliative Care visit made with SW Atha Starks.  Patient in bathroom when SW and RN arrive.  Patient's daughter Ashlee Reyes reports patient is having shortness of breath at times.  Daughter reports patient's 02 sats are in the 90's however patient's breathing is labored.  Education to daughter that patient's heart is weak and patient is likely feeling short of breath due to heart weakness.  Patient ambulates from bathroom with walker. Daughter reports patient's memory is getting worse and patient does not know her at times.  Daughter reports since  patient's hospitalization and rehab at [facility] in January  Patient's memory has worsened.  Daughter reports patient continues to complain of intermittent pain in left arm and neck.  Patient has Ultram for pain.  Patient's appetite remains good.  Patient has not suffered any falls.  Daughter states she is planning on taking patient to see pulmonary MD after the virus crisis is over.  Daughter denies having any questions or concerns regarding patient at the present time.  Daughter encouraged to call with questions or concerns.                     Nilda Simmer, RN

## 2019-03-02 ENCOUNTER — Other Ambulatory Visit: Payer: Self-pay | Admitting: *Deleted

## 2019-03-02 MED ORDER — ONDANSETRON 4 MG PO TBDP: 4.0000 mg | ORAL_TABLET | Freq: Three times a day (TID) | ORAL | 2 refills | Status: DC | PRN

## 2019-03-02 NOTE — Telephone Encounter (Signed)
Patient's daughter [name] called requesting a refill on her nausea medication. [name] stated that the pharmacy told her that they have requested this 3 times, no record of this.

## 2019-03-02 NOTE — Telephone Encounter (Signed)
done

## 2019-03-03 DIAGNOSIS — D485 Neoplasm of uncertain behavior of skin: Secondary | ICD-10-CM | POA: Diagnosis not present

## 2019-03-03 DIAGNOSIS — Z85828 Personal history of other malignant neoplasm of skin: Secondary | ICD-10-CM | POA: Diagnosis not present

## 2019-03-03 DIAGNOSIS — D0461 Carcinoma in situ of skin of right upper limb, including shoulder: Secondary | ICD-10-CM | POA: Diagnosis not present

## 2019-03-03 DIAGNOSIS — C44622 Squamous cell carcinoma of skin of right upper limb, including shoulder: Secondary | ICD-10-CM | POA: Diagnosis not present

## 2019-03-06 ENCOUNTER — Telehealth: Payer: Self-pay | Admitting: Cardiovascular Disease

## 2019-03-06 NOTE — Telephone Encounter (Signed)
New message    Pt c/o Shortness Of Breath: STAT if SOB developed within the last 24 hours or pt is noticeably SOB on the phone  1. Are you currently SOB (can you hear that pt is SOB on the phone)? Yes, the daughter states that she is sob now  2. How long have you been experiencing SOB?off and on for 3 years   3. Are you SOB when sitting or when up moving around?both  4. Are you currently experiencing any other symptoms?nauseated, no energy

## 2019-03-06 NOTE — Telephone Encounter (Signed)
Returned call to patient's daughter. She states that the patient has had SOB off and on for the past several years. She states that this has gotten a little worse over the past several days. She states that this is at rest and on exertion. She states that the patient has a cough as well. She denies fever, travel, or recent exposure to anyone sick. She states that she is tired all of the time and had some nausea the other day. She denies having any swelling or weight gain. Denies chest pain, lightheadedness, dizziness, sycnope, irregular beats, or any other Sx. BP 140/83 HR 81, SpO2 97% on RA. Patient is not on a diuretic and had recent echo. Patient uses QVAR and albuterol inhalers. Advised for patient to reach out to PCP and let us know if her Sx worsened.

## 2019-03-09 NOTE — Telephone Encounter (Signed)
I have not seen patient in 3 years.  She is now being seen by palliative care It seems like she has had a progressive decline over the past several years  I would defer to the palliative care team and her primary care team  for further management

## 2019-03-09 NOTE — Telephone Encounter (Signed)
Called and spoke with patient's daughter, [name], and advised her that Dr. Acie Fredrickson would like for patient's palliative care doctor or PCP make medical decisions for her since he has not been consistently involved in her care recently. Daughter states patient has an appointment with the allergy doctor next week and she will check with their office to see if they are still seeing patients. She asked me if she should seek a referral to pulmonology. I advised that if patient is uncomfortable that she should seek advice of PCP. Daughter states patient is not uncomfortable and that they both agree with limiting exposure during the time of threat of Covid 19. Daughter verbalized understanding and agreement with plan of care and thanked me and Dr. Acie Fredrickson for taking the time to call.

## 2019-03-10 ENCOUNTER — Telehealth: Payer: Self-pay

## 2019-03-10 NOTE — Telephone Encounter (Signed)
I spoke with the patients daughter about doing a tele visit instead of bringing her mom in.Daugheter(Ashlee Reyes) states that Ashlee Reyes has been having a lot of issues with her oxygen level going into the low 80s then back up to the mid 90s. The daughter stated she was told by the pcp that the patient needs to see a pulmonologist. I checked to see if that referral had been placed but I do not see anything. The daughter is also wondering if her mom should be on O2.  Please Advise.   PCP is in [place]

## 2019-03-10 NOTE — Telephone Encounter (Signed)
She needs to be seen if her oxygen is dropping. If we can get her a pulmonology visit this week that would be fine or she can see [place] in clinic

## 2019-03-11 ENCOUNTER — Telehealth: Payer: Self-pay | Admitting: General Practice

## 2019-03-11 NOTE — Telephone Encounter (Signed)
Spoke with pt's daughter, [name]. States that the pt has been having increased shortness of breath. Reports that this has been going on for over a month. Denied any issues with cough, fever, chest tightness, wheezing or any recent travel or sick contacts. Pt's daughter has been monitoring the pt's vitals. BP, pulse and oxygen saturations have been normal. There was one reading of 89% on room air but that has not happened but once. Renato Gails that due to the pt's age it would not be safe for her to come in the office for a visit. [name] agrees and states that she will continue to monitor the pt and call us back if anything changes. Advised her that if the pt's oxygen level got lower than 88% on room air that she would need to call 911. [name] agreed and verbalized understanding. Nothing further was needed at this time.

## 2019-03-11 NOTE — Telephone Encounter (Signed)
I have contacted [facility], they will contact the daughter to possibly set up a E Visit as they are not seeing patients in office. She is also going to Send a message back to Dr Melvyn Novas as the daughter requested to have an [place] doctor only.  Thanks

## 2019-03-11 NOTE — Telephone Encounter (Signed)
Hey Dr Neldon Mc please see the telephone contact with the Pulmonologist today.

## 2019-03-11 NOTE — Telephone Encounter (Signed)
Spoke with [name], she would like for me to call her back in an hour at the number below. She is not able to take the call now. Will call back later.    Phone [phone]

## 2019-03-17 ENCOUNTER — Ambulatory Visit: Payer: Medicare Other | Admitting: Allergy and Immunology

## 2019-03-18 ENCOUNTER — Other Ambulatory Visit: Payer: Self-pay | Admitting: Allergy and Immunology

## 2019-03-18 ENCOUNTER — Other Ambulatory Visit: Payer: Self-pay | Admitting: Family Medicine

## 2019-03-18 DIAGNOSIS — J3089 Other allergic rhinitis: Secondary | ICD-10-CM

## 2019-03-20 ENCOUNTER — Telehealth: Payer: Self-pay

## 2019-03-20 NOTE — Telephone Encounter (Signed)
Telephone call to schedule contact time for next week.  RN spoke with patient's daughter [name].  [name] reports she judt finished assisting patient with getting a shower.  [name] reports it is 'quite a ordeal" to get patient showered.  Linda in agreement with RN calling to perform PMPM visit 03-24-19 at 10:00 AM.  [name] encouraged to call with questions or concerns.

## 2019-03-24 ENCOUNTER — Other Ambulatory Visit: Payer: Self-pay

## 2019-03-24 ENCOUNTER — Other Ambulatory Visit: Payer: Medicare Other

## 2019-03-24 DIAGNOSIS — Z515 Encounter for palliative care: Secondary | ICD-10-CM

## 2019-03-24 NOTE — Progress Notes (Signed)
PATIENT NAME: Ashlee Reyes DOB: 1921-09-14 MRN: [id number]  PRIMARY CARE PROVIDER: Abner Greenspan, MD  RESPONSIBLE PARTY:  Acct ID - Guarantor Home Phone Work Phone Relationship Acct Type  [id number] ERIYAH, [id number]  Self P/F     [address], [place], [place] [address]    PLAN OF CARE and INTERVENTIONS:               1.  GOALS OF CARE/ ADVANCE CARE PLANNING:  Remain in home with daughter for as long as daughter can manage patient's care.                2.  PATIENT/CAREGIVER EDUCATION:  Education on signs and symptoms of infection, education on measures to aide with constipation, good handwashing.                3.  DISEASE STATUS: Patient is a 85 year old patient who resides in daughter Ashlee Reyes's home.  Patient continues to feel short of breath however O2 sats are normal.  Patient continues to complain of stomach pain frequently.      HISTORY OF PRESENT ILLNESS:    CODE STATUS: DNR   ADVANCED DIRECTIVES:Y MOST FORM: No PPS: 40%   PHYSICAL EXAM:   VITALS:See Vital signs, daughter took vital signs prior to telephonic visit.  LUNGS: shortness of breath on exertion, no cough  CARDIAC: Normal heart rate  EXTREMITIES: Trace edema SKIN: Trace edema to lower extremities.  NEURO: alert to self and familiars   Due to the COVID-19 crisis, this virtual check-in visit was done via telephone from my office and it was initiated and consent by this patient and or family.  Daughter Ashlee Reyes in agreement with telephonic visit.  Telephone visit made to patient and daughter Ashlee Reyes.  Ashlee Reyes checked patient's vital signs prior to telephonic visit.  Patient reports she "is doing OK."  Patient reports she feels nauseated frequently.  Daughter reports patient complains of nausea every morning and every afternoon.  Daughter reports patient takes Zofran for nausea.  Daughter reports patient continues to complain of pain in her left knee, back and stomach pain.  Daughter reports patient has  suffered with chronic pain stomach pain for 20 years.Daughter reports patient's bowels have been moving everyday until today.   Patient continues to take Miralax daily. Daughter reports she held Miralax for 2 days as patient's stools were loose.  RN provides education to daughter to decrease Miralax dose to 1/2 to 3/4 capful as patient sufferes with severe constipation.  Education to daughter if patient misses dose of medication patient may become more constipated.  Daughter reports patient uses rescue inhaler for shortness of breath.  Daughter reports patient is eating and sleeping well.  Daughter states patient's memory issues continue to worsen.  Daughter reports patient has no needs and she and patient have been staying in due to [place].  Support provided to daughter in managing patient's care.  Daughter encouraged to call with questions or concerns.                Nilda Simmer, RN

## 2019-04-01 NOTE — Telephone Encounter (Signed)
Pt seen 02/20/19.

## 2019-04-10 ENCOUNTER — Other Ambulatory Visit: Payer: Self-pay | Admitting: Family Medicine

## 2019-04-10 ENCOUNTER — Other Ambulatory Visit: Payer: Self-pay | Admitting: Allergy and Immunology

## 2019-04-10 DIAGNOSIS — J3089 Other allergic rhinitis: Secondary | ICD-10-CM

## 2019-04-10 NOTE — Telephone Encounter (Signed)
Name of Medication: Tramadol 50mg  Name of Pharmacy: [facility] or Written Date and Quantity: 02/16/19 #45, Daneil Dolin Last Office Visit and Type:   02/20/19 Acute Gerd/esophagitis  Next Office Visit and Type:  07/31/19 annual exam Last Controlled Substance Agreement Date: HOSPICE/PALLIATIVE PATIENT Last UDS: (same as above)

## 2019-04-13 ENCOUNTER — Telehealth: Payer: Self-pay

## 2019-04-13 NOTE — Telephone Encounter (Signed)
Telephone call to follow up on patient's status.  Patient's daughter [name] reports patient is "doing about the same.  [name] states she feels patient is having increased confusion at times.  Patient's son who resides at [facility] cre home was diagnosed with CoVid 19 virus.  [name] states he remains at facility and is receiving treatment.  [name] reports she has not informed her Mom that her brother has virus.  [name] denies patient having any needs at the present time.  [name] encouraged to call with questions or concerns.

## 2019-04-16 NOTE — Telephone Encounter (Signed)
Telephone call to patient's daughter [name] to schedule telephonic visit.  Daughter [name] in agreement with telephonic visit 04/23/19 at 1:30 PM.

## 2019-04-22 ENCOUNTER — Telehealth: Payer: Self-pay

## 2019-04-22 MED ORDER — ESOMEPRAZOLE MAGNESIUM 40 MG PO CPDR: 40.0000 mg | DELAYED_RELEASE_CAPSULE | Freq: Two times a day (BID) | ORAL | 11 refills | Status: DC

## 2019-04-22 NOTE — Telephone Encounter (Signed)
[name] pts daughter (DPR signed) left v/m that nexium is too expensive and request rx for generic nexium 40 mg taking one tab bid sent to [facility] [address].Please advise.

## 2019-04-22 NOTE — Telephone Encounter (Signed)
I sent it  

## 2019-04-22 NOTE — Telephone Encounter (Signed)
Message left for patient to return my call.  

## 2019-04-23 ENCOUNTER — Other Ambulatory Visit: Payer: Medicare Other

## 2019-04-23 ENCOUNTER — Other Ambulatory Visit: Payer: Self-pay

## 2019-04-23 ENCOUNTER — Other Ambulatory Visit: Payer: Self-pay | Admitting: Allergy and Immunology

## 2019-04-23 DIAGNOSIS — Z515 Encounter for palliative care: Secondary | ICD-10-CM

## 2019-04-23 NOTE — Telephone Encounter (Signed)
Pt daughter [name] aware rx has been sent

## 2019-04-23 NOTE — Progress Notes (Signed)
PATIENT NAME: Ashlee Reyes DOB: 1921/07/26 MRN: [id number]  PRIMARY CARE PROVIDER: Abner Greenspan, MD  RESPONSIBLE PARTY:  Acct ID - Guarantor Home Phone Work Phone Relationship Acct Type  [id number] Ashlee Reyes, Ashlee [phone]  Self P/F     [address], [place], [place] [address]    PLAN OF CARE and INTERVENTIONS:               1.  GOALS OF CARE/ ADVANCE CARE PLANNING:  Remain in home with daughter as long as daughter can manage patient's care.               2.  PATIENT/CAREGIVER EDUCATION:  Education on s/s of infection, education on fluid overload (weight gain ,edema, shortness of breath) education on fall precautions.               3.  DISEASE STATUS: Patient is a 85 year old patient who resides with daughter [name].  Visit completed telephonically.  Due to the COVID-19 crisis, this visit was done via telephonic from my office and it was initiated and consent by this patient and or family. Daughter reports patient is doing "fairly well."  Daughter states patient's weight is 175, patient has had almost a 5 pounds weight gain in a month. [name] reports patient's left ankle is swollen.  Education to daughter to encourage patient to keep lower extremities elevated.  Daughter reports patient's appetite is good.  Daughter reports patient does complain of pain in her back at times. Patient continues to have nausea every day.  Patient has Zofran in the home for nausea.  Daughter reports patient is not eating as well. Daughter states patient is eating smaller portions.  Daughter reports patient has been sleeping well.  Daughter states patient has needed meds and supplies.  Daughter remains in agreement with Palliative Care services for patient.  Daughter encouraged to call with questions or concerns.                    HISTORY OF PRESENT ILLNESS:    CODE STATUS:  DNR ADVANCED DIRECTIVES: Y MOST FORM: No PPS: 40%   PHYSICAL EXAM:   VITALS: See vital signs/ telephonic visit. Daughte took VS  prior to phone call  LUNGS: denied shortness of breath or cough CARDIAC: B/P elevated earlier 152/105 then 130/83 EXTREMITIES: Edema in LLE SKIN: no open areas of skin breakdown  NEURO: Alert to self and most familiars        Nilda Simmer, RN

## 2019-04-30 ENCOUNTER — Other Ambulatory Visit: Payer: Self-pay

## 2019-04-30 ENCOUNTER — Encounter: Payer: Self-pay | Admitting: Allergy and Immunology

## 2019-04-30 ENCOUNTER — Ambulatory Visit (INDEPENDENT_AMBULATORY_CARE_PROVIDER_SITE_OTHER): Payer: Medicare Other | Admitting: Allergy and Immunology

## 2019-04-30 VITALS — BP 123/70 | HR 82 | Temp 97.5°F | Wt 172.8 lb

## 2019-04-30 DIAGNOSIS — J3089 Other allergic rhinitis: Secondary | ICD-10-CM

## 2019-04-30 DIAGNOSIS — J4530 Mild persistent asthma, uncomplicated: Secondary | ICD-10-CM

## 2019-04-30 DIAGNOSIS — K219 Gastro-esophageal reflux disease without esophagitis: Secondary | ICD-10-CM | POA: Diagnosis not present

## 2019-04-30 NOTE — Patient Instructions (Addendum)
  1. Continue Nexium 40 - 1 tablet one time per day in AM  2. Continue famotidine 40mg  - 1 tablet one time per day in evening  3. If needed:    A. ProAir HFA or similar 2 inhalations every 4-6 hours  B. Mucinex DM 1-2 tablets twic a day  C. Nasal saline multiple times per day  D. Cetirizine 10mg  - 1 tablet one time per day  4. Return in Summer 2020 or earlier if problem

## 2019-04-30 NOTE — Progress Notes (Signed)
[facility]   Follow-up Note  Referring Provider: Tower, Wynelle Fanny, MD Primary Provider: Abner Greenspan, MD Date of Office Visit: 04/30/2019  Subjective:   Ashlee Reyes (DOB: 08-Feb-1921) is a 85 y.o. female who returns to the [facility] on 04/30/2019 in re-evaluation of the following:  HPI: This is a e- med visit requested by patient and patient's daughter who is located at home.  Ashlee Reyes is followed in this clinic for respiratory tract issues including asthma and reflux induced respiratory disease and rhinitis.  Her last visit to this clinic was 10 June 2018.  Lungs doing fine. Using albuterol 1-2 times per week. Daughter checking oxygen and seems like reading in the low 90's and not below. Not very active at home. Sleeping OK without respiratory problems. Not using Qvar. No steroid administration for asthma.   Nose doing fine. Not using Flonase.   Reflux OK but apparently has chronic nausea. Uses Zofran daily. Using Nexium and famotidine.   Allergies as of 04/30/2019      Reactions   Amoxicillin-pot Clavulanate Nausea And Vomiting   Has patient had a PCN reaction causing immediate rash, facial/tongue/throat swelling, SOB or lightheadedness with hypotension: No Has patient had a PCN reaction causing severe rash involving mucus membranes or skin necrosis: No Has patient had a PCN reaction that required hospitalization: No Has patient had a PCN reaction occurring within the last 10 years: No If all of the above answers are "NO", then may proceed with Cephalosporin use.   Avelox [moxifloxacin] Nausea And Vomiting   Flovent Hfa [fluticasone] Other (See Comments)   "makes her feel funny" per family member   Ibuprofen Other (See Comments)   GI upset   Keflex [cephalexin] Other (See Comments)   abd pain / GI upset   Nitrofurantoin Other (See Comments)   Chest pain or indigestion with nausea   Omeprazole Nausea  Only   Prevnar [pneumococcal 13-val Conj Vacc] Itching   Severe local reaction with redness and itching    Rosuvastatin Other (See Comments)   Foot pain   Sertraline Hcl Other (See Comments)   Made her more depressed   Tetanus Toxoids    Local redness and swelling       Medication List      acetaminophen 500 MG tablet Commonly known as:  TYLENOL Take 1 tablet (500 mg total) by mouth every 4 (four) hours as needed for mild pain.   albuterol 108 (90 Base) MCG/ACT inhaler Commonly known as:  VENTOLIN HFA INHALE 2 PUFFS BY MOUTH EVERY 4 HOURS AS NEEDED FOR WHEEZING   Align 4 MG Caps Take 1 capsule (4 mg total) by mouth daily.   amLODipine 2.5 MG tablet Commonly known as:  NORVASC Take one pill by mouth daily as needed for elevated blood pressure (over 145/90)   beclomethasone 80 MCG/ACT inhaler Commonly known as:  Qvar RediHaler Inhale 2 puffs into the lungs 2 (two) times daily.   cetirizine 10 MG tablet Commonly known as:  ZYRTEC TAKE 1 TABLET BY MOUTH EVERY DAY AS NEEDED   clopidogrel 75 MG tablet Commonly known as:  PLAVIX Take 1 tablet (75 mg total) by mouth daily.   docusate sodium 100 MG capsule Commonly known as:  COLACE Take 1 capsule (100 mg total) by mouth every evening.   esomeprazole 40 MG capsule Commonly known as:  NEXIUM Take 1 capsule (40 mg total) by mouth 2 (two)  times daily before a meal.   famotidine 40 MG tablet Commonly known as:  PEPCID Take 1 tablet (40 mg total) by mouth daily.   fluticasone 50 MCG/ACT nasal spray Commonly known as:  Flonase Place 2 sprays into both nostrils daily.   Lumigan 0.01 % Soln Generic drug:  bimatoprost Place 1 drop into both eyes at bedtime.   ondansetron 4 MG disintegrating tablet Commonly known as:  Zofran ODT Take 1 tablet (4 mg total) by mouth every 8 (eight) hours as needed for nausea or vomiting.   polyethylene glycol 17 g packet Commonly known as:  MIRALAX / GLYCOLAX Take 17 g by mouth daily as  needed for mild constipation.   SYSTANE OP Place 1 drop into both eyes every 8 (eight) hours as needed (dry eyes).   traMADol 50 MG tablet Commonly known as:  ULTRAM TAKE 1/2 TABLET BY MOUTH EVERY AM AND 1 AT BEDTIME   trimethoprim 100 MG tablet Commonly known as:  TRIMPEX Take 1 tablet (100 mg total) by mouth daily with lunch.       Past Medical History:  Diagnosis Date  . AAA (abdominal aortic aneurysm) ([place])    followed by Dr Oneida Alar. Last u/s07/10 stable AAA with largest measurement 3.97 x 4.02cm.  . Allergic rhinitis, seasonal   . Asthma   . Atrial fibrillation ([place])   . CAD (coronary artery disease) 07/2009   mod by cath 08/10  . Complication of anesthesia   . Depression   . Disc disease, degenerative, lumbar or lumbosacral    SPINAL STENOSIS  . Diverticulosis   . GERD (gastroesophageal reflux disease)   . Hemorrhoids   . History of recurrent UTIs   . Hyperlipidemia   . IBS (irritable bowel syndrome)   . IC (irritable colon)    DMSO in past  . Interstitial cystitis   . Osteoarthritis    spine/spinal stenosis  . Osteopenia 2011  . PONV (postoperative nausea and vomiting)   . Skin cancer    basal cell cancer - face.    Past Surgical History:  Procedure Laterality Date  . ABDOMINAL AORTIC ENDOVASCULAR STENT GRAFT N/A 07/01/2013   Procedure: ABDOMINAL AORTIC ENDOVASCULAR STENT GRAFT- GORE;  Surgeon: Elam Dutch, MD;  Location: [facility];  Service: Vascular;  Laterality: N/A;  Ultrasound guided  . ABDOMINAL HYSTERECTOMY     partial not cancer  . BREAST SURGERY     lumpectomy  . CYSTOCELE REPAIR    . ESOPHAGOGASTRODUODENOSCOPY  09/1998   erosive esphagitis ? stricture treated 12/2002  . HERNIA REPAIR  31/5176   umbilical hernia  . RECTOCELE REPAIR    . REFRACTIVE SURGERY     for film after cataract surgery.  Marland Kitchen RIGHT COLECTOMY  1997   benign  . SKIN LESION EXCISION  05/2011   basal cell skin lesion removed.    Review of systems negative except as noted  in HPI / PMHx or noted below:  Review of Systems  Constitutional: Negative.   HENT: Negative.   Eyes: Negative.   Respiratory: Negative.   Cardiovascular: Negative.   Gastrointestinal: Negative.   Genitourinary: Negative.   Musculoskeletal: Negative.   Skin: Negative.   Neurological: Negative.   Endo/Heme/Allergies: Negative.   Psychiatric/Behavioral: Negative.      Objective:   VS provided by daughter:  Vitals:   04/30/19 1015  BP: 123/70  Pulse: 82  Temp: (!) 97.5 F (36.4 C)  SpO2: 97%      Weight: 172 lb 12.8  oz (78.4 kg)(Informed by patient's daughter)   Physical Exam-deferred  Diagnostics: none  Assessment and Plan:   1. Asthma, well controlled, mild persistent   2. Other allergic rhinitis   3. LPRD (laryngopharyngeal reflux disease)     1. Continue Nexium 40 - 1 tablet one time per day in AM  2. Continue famotidine 40mg  - 1 tablet one time per day in evening  3. If needed:    A. ProAir HFA or similar 2 inhalations every 4-6 hours  B. Mucinex DM 1-2 tablets twic a day  C. Nasal saline multiple times per day  D. Cetirizine 10mg  - 1 tablet one time per day  4. Return in Summer 2020 or earlier if problem  It sounds as though Ashlee Reyes is doing relatively well and with minimal amount of medication administration she appears to have her respiratory tract issue under good control and her reflux issue under good control.  We will continue to have her use the plan noted above and see her back in this clinic in the summer 2020 or earlier if there is a problem.  Total patient interaction time 20 minutes.  Allena Katz, MD Allergy / Immunology [facility]

## 2019-05-04 ENCOUNTER — Encounter: Payer: Self-pay | Admitting: Allergy and Immunology

## 2019-05-07 ENCOUNTER — Encounter: Payer: Self-pay | Admitting: Family Medicine

## 2019-05-07 DIAGNOSIS — C44329 Squamous cell carcinoma of skin of other parts of face: Secondary | ICD-10-CM | POA: Diagnosis not present

## 2019-05-07 DIAGNOSIS — Z85828 Personal history of other malignant neoplasm of skin: Secondary | ICD-10-CM | POA: Diagnosis not present

## 2019-05-07 NOTE — Progress Notes (Signed)
Patient is at home.  Provider is in the office.  Start time: 10:05 am End time: 10:29 am Verbal consent given to file insurance.

## 2019-05-17 ENCOUNTER — Other Ambulatory Visit: Payer: Self-pay | Admitting: Family Medicine

## 2019-05-17 ENCOUNTER — Other Ambulatory Visit: Payer: Self-pay | Admitting: Allergy and Immunology

## 2019-05-17 DIAGNOSIS — J3089 Other allergic rhinitis: Secondary | ICD-10-CM

## 2019-05-18 ENCOUNTER — Telehealth: Payer: Self-pay

## 2019-05-18 NOTE — Telephone Encounter (Signed)
Telephone call to patient's daughter [name] to schedule teleahealth telephonic visit.  Linda requests visit on 05/28/19 at 8:30 AM.

## 2019-05-19 NOTE — Telephone Encounter (Signed)
Name of Medication: Tramadol 50mg  Name of Pharmacy: [facility] or Written Date and Quantity: 04/10/19 #45, 0 refills Last Office Visit and Type:   02/20/19 Acute Gerd/esophagitis  Next Office Visit and Type:  07/31/19 annual exam Last Controlled Substance Agreement Date: HOSPICE/PALLIATIVE PATIENT

## 2019-05-28 ENCOUNTER — Other Ambulatory Visit: Payer: Medicare Other

## 2019-05-28 ENCOUNTER — Other Ambulatory Visit: Payer: Self-pay

## 2019-05-28 DIAGNOSIS — Z515 Encounter for palliative care: Secondary | ICD-10-CM

## 2019-05-28 NOTE — Progress Notes (Signed)
PATIENT NAME: Ashlee Reyes. [place] DOB: 1921/11/26 MRN: [id number]  PRIMARY CARE PROVIDER: Abner Greenspan, MD  RESPONSIBLE PARTY:  Act ID - Guarantor Home Phone Work Phone Relationship Acct Type  [id number] CHENIKA, [phone]  Self P/F     [place], [place], [place] [address]    PLAN OF CARE and INTERVENTIONS:               1.  GOALS OF CARE/ ADVANCE CARE PLANNING: Remain at home with her daughter Ashlee Reyes.               2.  PATIENT/CAREGIVER EDUCATION:  Education on fall precautions, education to keep lower extremities elevated to reduce edema in LE's.              3.  DISEASE STATUS:Patient is a 85 year old patient who resides in home with her daughter Ashlee Reyes. Due to the COVID-19 crisis, this visit was done via telephonic from my office and it was initiated and consent by this patient and or family. Daughter Ashlee Reyes obtained patient's vital signs prior to telephonic visit.  Patient continues to have pain in her stomach and legs.  Ashlee Reyes reports she adminsiterers Tramadol to patient every evening and patient takes Tylenol as needed during the day.  Patient has been having episodes of elevated blood pressure. Daughter administers extra tablet of Norvasc to patient when B/P is elevated.  Patient continues to ambulate with her walker and has not suffered any falls.  Patient going with daughter to facility to see her son from car as today is sons 66th birthday.  Daughter reports patient continues to shortness of breath on exertion.  Patient has not had any cough or fever.  Daughter reports patient has days when she does not eat as well.  Daughter reports patient did not want to eat lunch yesterday.  Daughter reports patient has been sleeping well and naps throughout the day.  Patient continues to have memory issues and daughter feels patient does not know her some days.  Daughter denies patient having any needs at the present time.  Daughter encouraged to cal with questions or concerns.                    HISTORY OF PRESENT ILLNESS:    CODE STATUS: DNR  ADVANCED DIRECTIVES:Y MOST FORM: No PPS:40%   PHYSICAL EXAM:   VITALS:Daughter Ashlee Reyes took patient's VS prior to RN's visit  LUNGS: Shortness of breath on exertion, denies cough CARDIAC: normal heart rate EXTREMITIES: 2+ edema in LE's left greater than the right SKIN: Patient has no open areas of skin breakdown  NEURO: Patient has dementia        Nilda Simmer, RN

## 2019-06-01 DIAGNOSIS — R350 Frequency of micturition: Secondary | ICD-10-CM | POA: Diagnosis not present

## 2019-06-02 ENCOUNTER — Telehealth: Payer: Self-pay | Admitting: Family Medicine

## 2019-06-02 NOTE — Telephone Encounter (Signed)
I may be able to do a phone visit if they can send a good picture through mychart  If the picture is not clear enough / then she would need to be seen

## 2019-06-02 NOTE — Telephone Encounter (Signed)
I don't think so. (it is not secure)   Does she have a cell phone?  We could do a virtual visit with a smart phone if she has one (or if it is an iphone can do face time)   I think an android I may be able to use my doximetry app  If a family member has a smart phone they could borrow I think we could make it work   If not- I need to see her  Perhaps we could have her come in the side entrance to reduce exposure if I did ?

## 2019-06-02 NOTE — Telephone Encounter (Signed)
Patient's daughter called today about patient having some type of bite on her right leg.  They do not have a way to schedule a virtual visit but would like to know if they could schedule a phone visit and if there is anyway that a picture could be sent over to our office    BEST PHONE- [phone]

## 2019-06-02 NOTE — Telephone Encounter (Signed)
Called Patient's daughter back She stated that she is unable to do my chart. Even after walking her through the steps she said she does not do my chart or anything like that. She wanted to see if the picture could be sent through and e mail.

## 2019-06-03 ENCOUNTER — Ambulatory Visit (INDEPENDENT_AMBULATORY_CARE_PROVIDER_SITE_OTHER): Payer: Medicare Other | Admitting: Family Medicine

## 2019-06-03 ENCOUNTER — Encounter: Payer: Self-pay | Admitting: Family Medicine

## 2019-06-03 DIAGNOSIS — S80861A Insect bite (nonvenomous), right lower leg, initial encounter: Secondary | ICD-10-CM | POA: Diagnosis not present

## 2019-06-03 DIAGNOSIS — W57XXXA Bitten or stung by nonvenomous insect and other nonvenomous arthropods, initial encounter: Secondary | ICD-10-CM | POA: Diagnosis not present

## 2019-06-03 MED ORDER — DOXYCYCLINE HYCLATE 100 MG PO TABS: 100.0000 mg | ORAL_TABLET | Freq: Two times a day (BID) | ORAL | 0 refills | Status: DC

## 2019-06-03 MED ORDER — TRIAMCINOLONE ACETONIDE 0.1 % EX CREA: 1.0000 "application " | TOPICAL_CREAM | Freq: Two times a day (BID) | CUTANEOUS | 0 refills | Status: DC | PRN

## 2019-06-03 NOTE — Patient Instructions (Signed)
Keep bite site clean with soap and water  Elevate if able  Take doxycycline 100 mg twice daily to cover for skin infection or tick related illness Try the triamcinolone cream for itch/inflammation  Call asap if worse- pain /redness/size Also if fever or malaise or rash   Update if not starting to improve in a week or if worsening

## 2019-06-03 NOTE — Assessment & Plan Note (Signed)
On ankle- oval/ looks to be 3-4 cm in diameter  Scab in middle/ dark red surrounding  A little sore/more itchy Cannot r/o tick bite by hx (no signs of tick bourne illness)  For likely skin infection-inst to clean with soap and water  Elevate Px doxycycline to take 100 mg bid for 7d  And for itch/ inflammation- triamcinolone cream bid as needed Oral antihistamine at night is also ok  Update if not starting to improve in a week or if worsening

## 2019-06-03 NOTE — Progress Notes (Signed)
Virtual Visit via Video Note  I connected with Ashlee Reyes on 06/03/19 at  8:00 AM EDT by a video enabled telemedicine application and verified that I am speaking with the correct person using two identifiers.  Location: Patient: home Provider: office    I discussed the limitations of evaluation and management by telemedicine and the availability of in person appointments. The patient expressed understanding and agreed to proceed.  History of Present Illness: Presents with a bug bite lower on lower ankle  Thinks she was bitten on Sunday   ? Where it happened (did go in yard sat and on deck Sunday) May have been a tick- ? If it was attatched That was Sunday  Monday- started to get red  Monday-worse Has a puncture wound with white center- never drained  Tender and touchy-not severe pain  Does not hurt to watch  It itches   Gave benadryl last night - perhaps it helped a little  Also put benadryl cream  Used baking soda  Cleaning with soap and water   No fever  97.4 this am  No new body aches or joint swelling /pain more than usual No rash   bp is high this am 153/95  ? If she feels a little anxious   Review of Systems  Constitutional: Negative for chills, diaphoresis, fever, malaise/fatigue and weight loss.  HENT: Negative for sore throat.   Eyes: Negative for discharge and redness.  Respiratory: Negative for cough, shortness of breath and wheezing.   Cardiovascular: Negative for chest pain and palpitations.  Skin: Positive for itching. Negative for rash.  Neurological: Negative for dizziness and headaches.      Patient Active Problem List   Diagnosis Date Noted  . Insect bite of right lower leg 06/03/2019  . Allergic rhinitis 02/20/2019  . Palliative care status 01/12/2019  . Hypertensive urgency 12/24/2018  . Compression fracture of L1 lumbar vertebra (HCC) 09/26/2018  . Mobility impaired 09/26/2018  . History of endovascular stent graft for abdominal  aortic aneurysm (AAA) 01/01/2018  . Unilateral primary osteoarthritis, left knee 07/11/2017  . Hyperlipidemia 03/12/2017  . Frequent UTI/interstitial cystitis 02/06/2016  . Hypotension 01/09/2016  . Carotid stenosis 06/01/2015  . Local reaction to immunization 02/21/2015  . Encounter for Medicare annual wellness exam 02/15/2015  . Estrogen deficiency 02/15/2015  . Sinusitis, chronic 01/12/2015  . IBS (irritable bowel syndrome) 01/12/2015  . Orthostatic hypotension 01/11/2015  . Palpitations 05/13/2014  . Second degree heart block 10/18/2013  . Syncope 10/17/2013  . Aneurysm of abdominal vessel ([place]) 06/25/2013  . Osteopenia 01/13/2013  . CAD (coronary artery disease)   . Chronic depression 06/03/2007  . GERD 06/02/2007  . Asthma   . Chronic interstitial cystitis   . Osteoarthritis   . Spinal stenosis of lumbar region   . History of urinary tract infection    Past Medical History:  Diagnosis Date  . AAA (abdominal aortic aneurysm) ([place])    followed by Dr Oneida Alar. Last u/s07/10 stable AAA with largest measurement 3.97 x 4.02cm.  . Allergic rhinitis, seasonal   . Asthma   . Atrial fibrillation ([place])   . CAD (coronary artery disease) 07/2009   mod by cath 08/10  . Complication of anesthesia   . Depression   . Disc disease, degenerative, lumbar or lumbosacral    SPINAL STENOSIS  . Diverticulosis   . GERD (gastroesophageal reflux disease)   . Hemorrhoids   . History of recurrent UTIs   . Hyperlipidemia   .  IBS (irritable bowel syndrome)   . IC (irritable colon)    DMSO in past  . Interstitial cystitis   . Osteoarthritis    spine/spinal stenosis  . Osteopenia 2011  . PONV (postoperative nausea and vomiting)   . Skin cancer    basal cell cancer - face.   Past Surgical History:  Procedure Laterality Date  . ABDOMINAL AORTIC ENDOVASCULAR STENT GRAFT N/A 07/01/2013   Procedure: ABDOMINAL AORTIC ENDOVASCULAR STENT GRAFT- GORE;  Surgeon: Elam Dutch, MD;  Location: [facility];  Service: Vascular;  Laterality: N/A;  Ultrasound guided  . ABDOMINAL HYSTERECTOMY     partial not cancer  . BREAST SURGERY     lumpectomy  . CYSTOCELE REPAIR    . ESOPHAGOGASTRODUODENOSCOPY  09/1998   erosive esphagitis ? stricture treated 12/2002  . HERNIA REPAIR  [id number]/4696   umbilical hernia  . RECTOCELE REPAIR    . REFRACTIVE SURGERY     for film after cataract surgery.  Marland Kitchen RIGHT COLECTOMY  1997   benign  . SKIN LESION EXCISION  05/2011   basal cell skin lesion removed.   Social History   Tobacco Use  . Smoking status: Never Smoker  . Smokeless tobacco: Never Used  Substance Use Topics  . Alcohol use: No    Alcohol/week: 0.0 standard drinks  . Drug use: No   Family History  Problem Relation Age of Onset  . Heart disease Mother        deceased age 81 heart attack.  Marland Kitchen Heart attack Mother   . Other Mother        varicose veins  . Varicose Veins Mother   . Heart disease Father        age 68 heart attack  . Cancer Father   . Stroke Brother   . Cancer Brother   . Heart disease Brother   . Heart attack Brother   . Cancer Daughter   . Diabetes Son    Allergies  Allergen Reactions  . Amoxicillin-Pot Clavulanate Nausea And Vomiting    Has patient had a PCN reaction causing immediate rash, facial/tongue/throat swelling, SOB or lightheadedness with hypotension: No Has patient had a PCN reaction causing severe rash involving mucus membranes or skin necrosis: No Has patient had a PCN reaction that required hospitalization: No Has patient had a PCN reaction occurring within the last 10 years: No If all of the above answers are "NO", then may proceed with Cephalosporin use.   . Avelox [Moxifloxacin] Nausea And Vomiting  . Flovent Hfa [Fluticasone] Other (See Comments)    "makes her feel funny" per family member  . Ibuprofen Other (See Comments)    GI upset  . Keflex [Cephalexin] Other (See Comments)    abd pain / GI upset  . Nitrofurantoin Other (See Comments)     Chest pain or indigestion with nausea  . Omeprazole Nausea Only  . Prevnar [Pneumococcal 13-Val Conj Vacc] Itching    Severe local reaction with redness and itching   . Rosuvastatin Other (See Comments)    Foot pain  . Sertraline Hcl Other (See Comments)    Made her more depressed  . Tetanus Toxoids     Local redness and swelling    Current Outpatient Medications on File Prior to Visit  Medication Sig Dispense Refill  . acetaminophen (TYLENOL) 500 MG tablet Take 1 tablet (500 mg total) by mouth every 4 (four) hours as needed for mild pain. 30 tablet 0  . albuterol (VENTOLIN HFA)  108 (90 Base) MCG/ACT inhaler INHALE 2 PUFFS BY MOUTH EVERY 4 HOURS AS NEEDED FOR WHEEZING 18 g 1  . amLODipine (NORVASC) 2.5 MG tablet Take one pill by mouth daily as needed for elevated blood pressure (over 145/90) 30 tablet 11  . beclomethasone (QVAR REDIHALER) 80 MCG/ACT inhaler Inhale 2 puffs into the lungs 2 (two) times daily. 1 Inhaler 0  . cetirizine (ZYRTEC) 10 MG tablet TAKE 1 TABLET BY MOUTH EVERYDAY AS NEEDED 30 tablet 4  . clopidogrel (PLAVIX) 75 MG tablet Take 1 tablet (75 mg total) by mouth daily. 30 tablet 11  . docusate sodium (COLACE) 100 MG capsule Take 1 capsule (100 mg total) by mouth every evening. 10 capsule 0  . esomeprazole (NEXIUM) 40 MG capsule Take 1 capsule (40 mg total) by mouth 2 (two) times daily before a meal. 60 capsule 11  . famotidine (PEPCID) 40 MG tablet Take 1 tablet (40 mg total) by mouth daily. 30 tablet 5  . fluticasone (FLONASE) 50 MCG/ACT nasal spray Place 2 sprays into both nostrils daily. 16 g 11  . LUMIGAN 0.01 % SOLN Place 1 drop into both eyes at bedtime.    . ondansetron (ZOFRAN ODT) 4 MG disintegrating tablet Take 1 tablet (4 mg total) by mouth every 8 (eight) hours as needed for nausea or vomiting. 20 tablet 2  . Polyethyl Glycol-Propyl Glycol (SYSTANE OP) Place 1 drop into both eyes every 8 (eight) hours as needed (dry eyes).     . polyethylene glycol (MIRALAX /  GLYCOLAX) packet Take 17 g by mouth daily as needed for mild constipation. 14 each 0  . Probiotic Product (ALIGN) 4 MG CAPS Take 1 capsule (4 mg total) by mouth daily.    . traMADol (ULTRAM) 50 MG tablet TAKE 1/2 TABLET BY MOUTH EVERY MORNING AND 1 TABLET AT BEDTIME 45 tablet 0  . trimethoprim (TRIMPEX) 100 MG tablet Take 1 tablet (100 mg total) by mouth daily with lunch. 30 tablet 0   No current facility-administered medications on file prior to visit.     Observations/Objective: Patient appears well, in no distress Weight is baseline (obese) No facial swelling or asymmetry Normal voice-not hoarse and no slurred speech No obvious tremor or mobility impairment Moving neck and UEs normally Hearing impaired- family helps her with hx and communication  No cough or shortness of breath during interview  Talkative and mentally sharp with no cognitive changes 4 cm oval area of erythema and induration (consistent with bug bite) on R anterior ankle  It has a dark tiny scab in the middle , no obvious drainage  No vesicles or pustules Per pt is warm to the touch and slightly tender  No excoriations No bullseye shape or streaking  Leg does not appear swollen Affect is normal        Assessment and Plan: Problem List Items Addressed This Visit      Musculoskeletal and Integument   Insect bite of right lower leg    On ankle- oval/ looks to be 3-4 cm in diameter  Scab in middle/ dark red surrounding  A little sore/more itchy Cannot r/o tick bite by hx (no signs of tick bourne illness)  For likely skin infection-inst to clean with soap and water  Elevate Px doxycycline to take 100 mg bid for 7d  And for itch/ inflammation- triamcinolone cream bid as needed Oral antihistamine at night is also ok  Update if not starting to improve in a week or if worsening  Follow Up Instructions: Keep bite site clean with soap and water  Elevate if able  Take doxycycline 100 mg twice  daily to cover for skin infection or tick related illness Try the triamcinolone cream for itch/inflammation  Call asap if worse- pain /redness/size Also if fever or malaise or rash   Update if not starting to improve in a week or if worsening     I discussed the assessment and treatment plan with the patient. The patient was provided an opportunity to ask questions and all were answered. The patient agreed with the plan and demonstrated an understanding of the instructions.   The patient was advised to call back or seek an in-person evaluation if the symptoms worsen or if the condition fails to improve as anticipated.    Loura Pardon, MD

## 2019-06-22 ENCOUNTER — Telehealth: Payer: Self-pay

## 2019-06-22 NOTE — Telephone Encounter (Signed)
Telephone call to patient to schedule TELEHEALTH visit with patient/family. Patient/family in agreement with TELEHEALTH visit on 06-25-19 at 9:30 AM.

## 2019-06-25 ENCOUNTER — Other Ambulatory Visit: Payer: Self-pay

## 2019-06-25 ENCOUNTER — Other Ambulatory Visit: Payer: Medicare Other

## 2019-06-25 DIAGNOSIS — Z515 Encounter for palliative care: Secondary | ICD-10-CM

## 2019-06-25 NOTE — Progress Notes (Signed)
COMMUNITY PALLIATIVE CARE SW NOTE  PATIENT NAME: Ashlee Reyes DOB: 01-Aug-1921 MRN: [id number]  PRIMARY CARE PROVIDER: Abner Greenspan, MD  RESPONSIBLE PARTY:  Acct ID - Guarantor Home Phone Work Phone Relationship Acct Type  [id number] BURNETTE, VALENTI210 [phone]  Self P/F     [address], [place], [place] [address]     PLAN OF CARE and INTERVENTIONS:             1. GOALS OF CARE/ ADVANCE CARE PLANNING:  Patient is a DNR, form is in the home. Patient's goal is to remain at home with daughter. 2. SOCIAL/EMOTIONAL/SPIRITUAL ASSESSMENT/ INTERVENTIONS:  SW completed TELEHEALTH visit with patient and [name] (patient's daughter). Patient is doing "good". [name] reports that patient's weight was 173.2 today, and patient's vitals look great. [name] noted that patient's pain levels are lower, continues to take Tramadol at night. Patient is still struggling with nausea and this does seem to impact her appetite per [name]. Patient tries to eat slowly and this helps. Patient continues to have memory concerns, [name] feels that this has become much worse in the past three-four weeks. Patient and [name] having been spending time outside, enjoying the sunshine. Patient was also able to do a window visit with her son at the nursing facility yesterday. 3. PATIENT/CAREGIVER EDUCATION/ COPING:  Patient and family are coping "well" overall. [name] does express feeling "not well" and "tired" today following a busy day yesterday. SW and [name] discussed caregiver fatigue and the importance of self-care.  4. PERSONAL EMERGENCY PLAN:  Family will call 9-1-1 for emergencies. Due to COVID-19 crisis, [name] verbalized that patient is remaining at home and limiting contacts.  5. COMMUNITY RESOURCES COORDINATION/ HEALTH CARE NAVIGATION:  [name] coordinates all of patient's care. Patient is scheduled for Medicare Wellness Visit in early August. No concerns.  6. FINANCIAL/LEGAL CONCERNS/INTERVENTIONS:  None.     SOCIAL HX:   Social History   Tobacco Use  . Smoking status: Never Smoker  . Smokeless tobacco: Never Used  Substance Use Topics  . Alcohol use: No    Alcohol/week: 0.0 standard drinks    CODE STATUS:   Code Status: Prior  ADVANCED DIRECTIVES: N MOST FORM COMPLETE:  No. HOSPICE EDUCATION PROVIDED: None.  PPS: Patient needs assistance with bathing and dressing, but is able to toilet and feed herself.    Due to the COVID-19 crisis, this visit was done via telephone from my office and it was initiated and consent by this patient and/or family. This was a scheduled visit.   I spent 45 minutes with patient/family, from 9:30-10:00a providing education, support and consultation.   Margaretmary Lombard, LCSW

## 2019-07-10 ENCOUNTER — Telehealth: Payer: Self-pay

## 2019-07-10 NOTE — Telephone Encounter (Signed)
Telephone call to patient's daughter [name].  [name] did not answer phone.  RN left message requesting call back from daughter to schedule palliative care visit next week.

## 2019-07-13 ENCOUNTER — Telehealth: Payer: Self-pay

## 2019-07-13 NOTE — Telephone Encounter (Signed)
Telephone call from patient's daughter Ashlee Reyes.  RN left message on Friday requesting call back to schedule visit.  Daughter reports patient is doing well.  Daughter states she has a lot going on with other family members. Daughyer requests RN call back on 07/20/19 to schedule visit time for 2nd week in August.  Daughter informed to call with concerns.

## 2019-07-17 ENCOUNTER — Telehealth: Payer: Self-pay

## 2019-07-17 NOTE — Telephone Encounter (Signed)
RN called to schedule time to visit for next week per daughters Linda's request.  No one answered phone, RN left message requesting daughter call back to schedule time for RN to visit next week.

## 2019-07-21 ENCOUNTER — Telehealth: Payer: Self-pay

## 2019-07-21 NOTE — Telephone Encounter (Signed)
Telephone call from patient's daughter [name] returning RN's call from last week.  [name] states she would like palliative visit on 07-30-19 at 10:00 AM.  [name] states she will call and let RN know if she wants home or telephonic.  Patient will be having her 89th birthday on Friday 07[birth year].

## 2019-07-24 ENCOUNTER — Ambulatory Visit (INDEPENDENT_AMBULATORY_CARE_PROVIDER_SITE_OTHER): Payer: Medicare Other

## 2019-07-24 ENCOUNTER — Ambulatory Visit: Payer: Medicare Other

## 2019-07-24 VITALS — BP 130/86 | HR 73 | Temp 97.4°F | Ht 66.0 in | Wt 173.0 lb

## 2019-07-24 DIAGNOSIS — Z0000 Encounter for general adult medical examination without abnormal findings: Secondary | ICD-10-CM | POA: Diagnosis not present

## 2019-07-24 NOTE — Progress Notes (Signed)
Subjective:   Ashlee Reyes is a 85 y.o. female who presents for Medicare Annual (Subsequent) preventive examination.  This visit type was conducted due to national recommendations for restrictions regarding the COVID-19 Pandemic (e.g. social distancing). This format is felt to be most appropriate for this patient at this time. All issues noted in this document were discussed and addressed. No physical exam was performed (except for noted visual exam findings with Video Visits). This patient, Ashlee Reyes and her daughter Ashlee Reyes, have given permission to perform this visit via telephone. Vital signs may be absent or patient reported.  Patient location:  At home  Nurse location:  At home     Review of Systems:  n/a Cardiac Risk Factors include: advanced age (>53men, >93 women);dyslipidemia;hypertension;sedentary lifestyle     Objective:     Vitals: BP 130/86 Comment: per daughter  Pulse 73 Comment: per daughter  Temp (!) 97.4 F (36.3 C) Comment: per daughter  Ht 5\' 6"  (1.676 m) Comment: per patient  Wt 173 lb (78.5 kg) Comment: per daughter  LMP 12/17/1968   BMI 27.92 kg/m   Body mass index is 27.92 kg/m.  Advanced Directives 07/24/2019 07/08/2018 02/20/2018 01/01/2018 09/05/2017 08/22/2017 03/07/2017  Does Patient Have a Medical Advance Directive? Yes Yes No (No Data) Yes Yes Yes  Type of Paramedic of [place];Living will - - [place];Living will [place];Living will [place];Living will  Does patient want to make changes to medical advance directive? No - Patient declined - - - - - -  Copy of [place] in Chart? No - copy requested Yes - - - No - copy requested No - copy requested  Would patient like information on creating a medical advance directive? - - No - Patient declined - - - -    Tobacco Social History   Tobacco  Use  Smoking Status Never Smoker  Smokeless Tobacco Never Used     Counseling given: Not Answered   Clinical Intake:  Pre-visit preparation completed: Yes  Pain : No/denies pain     Nutritional Status: BMI 25 -29 Overweight Nutritional Risks: None Diabetes: No  How often do you need to have someone help you when you read instructions, pamphlets, or other written materials from your doctor or pharmacy?: 1 - Never What is the last grade level you completed in school?: 9th or 10th grade  Interpreter Needed?: No  Information entered by :: NAllen LPN  Past Medical History:  Diagnosis Date  . AAA (abdominal aortic aneurysm) ([place])    followed by Dr Oneida Alar. Last u/s07/10 stable AAA with largest measurement 3.97 x 4.02cm.  . Allergic rhinitis, seasonal   . Asthma   . Atrial fibrillation ([place])   . CAD (coronary artery disease) 07/2009   mod by cath 08/10  . Complication of anesthesia   . Depression   . Disc disease, degenerative, lumbar or lumbosacral    SPINAL STENOSIS  . Diverticulosis   . GERD (gastroesophageal reflux disease)   . Hemorrhoids   . History of recurrent UTIs   . Hyperlipidemia   . IBS (irritable bowel syndrome)   . IC (irritable colon)    DMSO in past  . Interstitial cystitis   . Osteoarthritis    spine/spinal stenosis  . Osteopenia 2011  . PONV (postoperative nausea and vomiting)   . Skin cancer    basal cell cancer -  face.   Past Surgical History:  Procedure Laterality Date  . ABDOMINAL AORTIC ENDOVASCULAR STENT GRAFT N/A 07/01/2013   Procedure: ABDOMINAL AORTIC ENDOVASCULAR STENT GRAFT- GORE;  Surgeon: Elam Dutch, MD;  Location: [facility];  Service: Vascular;  Laterality: N/A;  Ultrasound guided  . ABDOMINAL HYSTERECTOMY     partial not cancer  . BREAST SURGERY     lumpectomy  . CYSTOCELE REPAIR    . ESOPHAGOGASTRODUODENOSCOPY  09/1998   erosive esphagitis ? stricture treated 12/2002  . HERNIA REPAIR  16/1096   umbilical hernia  .  RECTOCELE REPAIR    . REFRACTIVE SURGERY     for film after cataract surgery.  Marland Kitchen RIGHT COLECTOMY  1997   benign  . SKIN LESION EXCISION  05/2011   basal cell skin lesion removed.   Family History  Problem Relation Age of Onset  . Heart disease Mother        deceased age 7 heart attack.  Marland Kitchen Heart attack Mother   . Other Mother        varicose veins  . Varicose Veins Mother   . Heart disease Father        age 51 heart attack  . Cancer Father   . Stroke Brother   . Cancer Brother   . Heart disease Brother   . Heart attack Brother   . Cancer Daughter   . Diabetes Son    Social History   Socioeconomic History  . Marital status: Widowed    Spouse name: Not on file  . Number of children: Not on file  . Years of education: Not on file  . Highest education level: Not on file  Occupational History  . Occupation: retired  Scientific laboratory technician  . Financial resource strain: Not hard at all  . Food insecurity    Worry: Never true    Inability: Never true  . Transportation needs    Medical: No    Non-medical: No  Tobacco Use  . Smoking status: Never Smoker  . Smokeless tobacco: Never Used  Substance and Sexual Activity  . Alcohol use: No    Alcohol/week: 0.0 standard drinks  . Drug use: No  . Sexual activity: Not Currently  Lifestyle  . Physical activity    Days per week: 0 days    Minutes per session: 0 min  . Stress: Not at all  Relationships  . Social Herbalist on phone: Not on file    Gets together: Not on file    Attends religious service: Not on file    Active member of club or organization: Not on file    Attends meetings of clubs or organizations: Not on file    Relationship status: Not on file  Other Topics Concern  . Not on file  Social History Narrative  . Not on file    Outpatient Encounter Medications as of 07/24/2019  Medication Sig  . acetaminophen (TYLENOL) 500 MG tablet Take 1 tablet (500 mg total) by mouth every 4 (four) hours as needed for  mild pain.  Marland Kitchen albuterol (VENTOLIN HFA) 108 (90 Base) MCG/ACT inhaler INHALE 2 PUFFS BY MOUTH EVERY 4 HOURS AS NEEDED FOR WHEEZING  . amLODipine (NORVASC) 2.5 MG tablet Take one pill by mouth daily as needed for elevated blood pressure (over 145/90)  . beclomethasone (QVAR REDIHALER) 80 MCG/ACT inhaler Inhale 2 puffs into the lungs 2 (two) times daily.  . cetirizine (ZYRTEC) 10 MG tablet TAKE 1 TABLET BY  MOUTH EVERYDAY AS NEEDED  . clopidogrel (PLAVIX) 75 MG tablet Take 1 tablet (75 mg total) by mouth daily.  Marland Kitchen docusate sodium (COLACE) 100 MG capsule Take 1 capsule (100 mg total) by mouth every evening.  Marland Kitchen esomeprazole (NEXIUM) 40 MG capsule Take 1 capsule (40 mg total) by mouth 2 (two) times daily before a meal.  . famotidine (PEPCID) 40 MG tablet Take 1 tablet (40 mg total) by mouth daily.  . fluticasone (FLONASE) 50 MCG/ACT nasal spray Place 2 sprays into both nostrils daily.  Marland Kitchen LUMIGAN 0.01 % SOLN Place 1 drop into both eyes at bedtime.  . ondansetron (ZOFRAN ODT) 4 MG disintegrating tablet Take 1 tablet (4 mg total) by mouth every 8 (eight) hours as needed for nausea or vomiting.  [name] Glycol-Propyl Glycol (SYSTANE OP) Place 1 drop into both eyes every 8 (eight) hours as needed (dry eyes).   . polyethylene glycol (MIRALAX / GLYCOLAX) packet Take 17 g by mouth daily as needed for mild constipation.  . Probiotic Product (ALIGN) 4 MG CAPS Take 1 capsule (4 mg total) by mouth daily.  . traMADol (ULTRAM) 50 MG tablet TAKE 1/2 TABLET BY MOUTH EVERY MORNING AND 1 TABLET AT BEDTIME  . trimethoprim (TRIMPEX) 100 MG tablet Take 1 tablet (100 mg total) by mouth daily with lunch.  . doxycycline (VIBRA-TABS) 100 MG tablet Take 1 tablet (100 mg total) by mouth 2 (two) times daily. (Patient not taking: Reported on 07/24/2019)  . triamcinolone cream (KENALOG) 0.1 % Apply 1 application topically 2 (two) times daily as needed. To affected area (Patient not taking: Reported on 07/24/2019)   No  facility-administered encounter medications on file as of 07/24/2019.     Activities of Daily Living In your present state of health, do you have any difficulty performing the following activities: 07/24/2019  Hearing? Y  Comment has hearing aides  Vision? N  Difficulty concentrating or making decisions? Y  Walking or climbing stairs? Y  Comment uses a ramp  Dressing or bathing? N  Comment needs help getting in and out of shower  Doing errands, shopping? Y  Preparing Food and eating ? Y  Comment daughter prepares  Using the Toilet? N  In the past six months, have you accidently leaked urine? Y  Do you have problems with loss of bowel control? N  Managing your Medications? Y  Comment daughter manages  Managing your Finances? Y  Comment daughter Engineer, production or managing your Housekeeping? Y  Comment daughter manages  Some recent data might be hidden    Patient Care Team: Tower, Wynelle Fanny, MD as PCP - General Kozlow, Donnamarie Poag, MD as Consulting Physician (Allergy and Immunology) Bjorn Loser, MD as Consulting Physician (Urology) Rolm Bookbinder, MD as Consulting Physician (Dermatology) Elam Dutch, MD as Consulting Physician (Vascular Surgery) Sater, Nanine Means, MD as Consulting Physician (Neurology) Clarene Essex, MD as Consulting Physician (Gastroenterology) Alfonse Ras May, MD as Referring Physician (Audiology) Marygrace Drought, MD as Consulting Physician (Ophthalmology) Maricela Curet, RN as Registered Nurse    Assessment:   This is a routine wellness examination for Ashlee Reyes.  Exercise Activities and Dietary recommendations Current Exercise Habits: The patient does not participate in regular exercise at present  Goals    . Patient Stated     Starting 07/08/2018, I will continue to take medications as prescribed.        Fall Risk Fall Risk  07/24/2019 07/17/2018 07/08/2018 03/07/2017 03/05/2016  Falls in the past year?  0 No No No No  Comment - Emmi  Telephone Survey: data to providers prior to load - - -  Risk for fall due to : Impaired balance/gait;Medication side effect - - - -  Follow up Falls evaluation completed;Falls prevention discussed - - - -   Is the patient's home free of loose throw rugs in walkways, pet beds, electrical cords, etc?   yes      Grab bars in the bathroom? yes      Handrails on the stairs?   yes      Adequate lighting?   yes  Timed Get Up and Go performed: n/a  Depression Screen PHQ 2/9 Scores 07/24/2019 07/08/2018 03/07/2017 03/05/2016  PHQ - 2 Score 0 1 1 0  PHQ- 9 Score 9 1 - -     Cognitive Function MMSE - Mini Mental State Exam 07/24/2019 07/08/2018 03/07/2017 03/05/2016  Not completed: Unable to complete - - -  Orientation to time - 3 5 5   Orientation to time comments - unable to specify year - -  Orientation to Place - 5 5 5   Registration - 3 3 3   Attention/ Calculation - 0 0 5  Recall - 1 0 3  Recall-comments - unable to recall 2 of 3 words pt was unable to recall 3 of 3 words -  Language- name 2 objects - 0 0 0  Language- repeat - 1 1 0  Language- follow 3 step command - 3 2 3   Language- follow 3 step command-comments - - pt was unable to follow 1 step of 3 step command -  Language- read & follow direction - 0 0 1  Write a sentence - 0 0 0  Copy design - 0 0 0  Total score - 16 16 25         Immunization History  Administered Date(s) Administered  . Influenza Split 09/17/2012  . Influenza Whole 10/02/2005, 09/18/2007, 09/14/2008, 09/14/2009, 12/13/2010  . Influenza,inj,Quad PF,6+ Mos 09/30/2013, 02/15/2015, 03/05/2016, 11/02/2016, 09/26/2018  . Pneumococcal Conjugate-13 02/15/2015  . Td 07/29/2012    Qualifies for Shingles Vaccine? yes  Screening Tests Health Maintenance  Topic Date Due  . INFLUENZA VACCINE  07/18/2019  . MAMMOGRAM  02/14/2021 (Originally 10/23/2003)  . PNA vac Low Risk Adult (2 of 2 - PPSV23) 02/14/2021 (Originally 02/15/2016)  . TETANUS/TDAP  07/29/2022  . DEXA SCAN   Completed    Cancer Screenings: Lung: Low Dose CT Chest recommended if Age 44-80 years, 30 pack-year currently smoking OR have quit w/in 15years. Patient does not qualify. Breast:  Up to date on Mammogram? Yes   Up to date of Bone Density/Dexa? Yes Colorectal: not required  Additional Screenings: : Hepatitis C Screening: n/a     Plan:    Mini-Cog was not completed due to known memory issues per daughter.   I have personally reviewed and noted the following in the patient's chart:   . Medical and social history . Use of alcohol, tobacco or illicit drugs  . Current medications and supplements . Functional ability and status . Nutritional status . Physical activity . Advanced directives . List of other physicians . Hospitalizations, surgeries, and ER visits in previous 12 months . Vitals . Screenings to include cognitive, depression, and falls . Referrals and appointments  In addition, I have reviewed and discussed with patient certain preventive protocols, quality metrics, and best practice recommendations. A written personalized care plan for preventive services as well as general preventive health recommendations were provided to  patient.     Kellie Simmering, LPN  02/17/3544

## 2019-07-24 NOTE — Progress Notes (Signed)
PCP notes:  Health Maintenance:  No gaps  Abnormal Screenings:  Depression screen 9: Patient wants to not wake up, but does not want to hurt self.   Patient concerns:  None posed by daughter.  Nurse concerns:  Patient not wanting to wake up.  Next PCP appt.: 07/31/2019 at 9:30

## 2019-07-24 NOTE — Patient Instructions (Signed)
[name] , Thank you for taking time to come for your Medicare Wellness Visit. I appreciate your ongoing commitment to your health goals. Please review the following plan we discussed and let me know if I can assist you in the future.   Screening recommendations/referrals: Colonoscopy: not required Mammogram: not required Bone Density: 09/2010 Recommended yearly ophthalmology/optometry visit for glaucoma screening and checkup Recommended yearly dental visit for hygiene and checkup  Vaccinations: Influenza vaccine: 09/2018 Pneumococcal vaccine: 02/2015 Tdap vaccine: 07/2012 Shingles vaccine: discussed    Advanced directives: Please bring a copy of your POA (Power of [place]) and/or Living Will to your next appointment.    Conditions/risks identified: overweight  Next appointment: 07/31/2019 at 9:30   Preventive Care 38 Years and Older, Female Preventive care refers to lifestyle choices and visits with your health care provider that can promote health and wellness. What does preventive care include?  A yearly physical exam. This is also called an annual well check.  Dental exams once or twice a year.  Routine eye exams. Ask your health care provider how often you should have your eyes checked.  Personal lifestyle choices, including:  Daily care of your teeth and gums.  Regular physical activity.  Eating a healthy diet.  Avoiding tobacco and drug use.  Limiting alcohol use.  Practicing safe sex.  Taking low-dose aspirin every day.  Taking vitamin and mineral supplements as recommended by your health care provider. What happens during an annual well check? The services and screenings done by your health care provider during your annual well check will depend on your age, overall health, lifestyle risk factors, and family history of disease. Counseling  Your health care provider may ask you questions about your:  Alcohol use.  Tobacco use.  Drug use.   Emotional well-being.  Home and relationship well-being.  Sexual activity.  Eating habits.  History of falls.  Memory and ability to understand (cognition).  Work and work Statistician.  Reproductive health. Screening  You may have the following tests or measurements:  Height, weight, and BMI.  Blood pressure.  Lipid and cholesterol levels. These may be checked every 5 years, or more frequently if you are over 34 years old.  Skin check.  Lung cancer screening. You may have this screening every year starting at age 83 if you have a 30-pack-year history of smoking and currently smoke or have quit within the past 15 years.  Fecal occult blood test (FOBT) of the stool. You may have this test every year starting at age 52.  Flexible sigmoidoscopy or colonoscopy. You may have a sigmoidoscopy every 5 years or a colonoscopy every 10 years starting at age 84.  Hepatitis C blood test.  Hepatitis B blood test.  Sexually transmitted disease (STD) testing.  Diabetes screening. This is done by checking your blood sugar (glucose) after you have not eaten for a while (fasting). You may have this done every 1-3 years.  Bone density scan. This is done to screen for osteoporosis. You may have this done starting at age 58.  Mammogram. This may be done every 1-2 years. Talk to your health care provider about how often you should have regular mammograms. Talk with your health care provider about your test results, treatment options, and if necessary, the need for more tests. Vaccines  Your health care provider may recommend certain vaccines, such as:  Influenza vaccine. This is recommended every year.  Tetanus, diphtheria, and acellular pertussis (Tdap, Td) vaccine. You may need a  Td booster every 10 years.  Zoster vaccine. You may need this after age 23.  Pneumococcal 13-valent conjugate (PCV13) vaccine. One dose is recommended after age 68.  Pneumococcal polysaccharide (PPSV23)  vaccine. One dose is recommended after age 57. Talk to your health care provider about which screenings and vaccines you need and how often you need them. This information is not intended to replace advice given to you by your health care provider. Make sure you discuss any questions you have with your health care provider. Document Released: 12/30/2015 Document Revised: 08/22/2016 Document Reviewed: 10/04/2015 Elsevier Interactive Patient Education  2017 Index Prevention in the Home Falls can cause injuries. They can happen to people of all ages. There are many things you can do to make your home safe and to help prevent falls. What can I do on the outside of my home?  Regularly fix the edges of walkways and driveways and fix any cracks.  Remove anything that might make you trip as you walk through a door, such as a raised step or threshold.  Trim any bushes or trees on the path to your home.  Use bright outdoor lighting.  Clear any walking paths of anything that might make someone trip, such as rocks or tools.  Regularly check to see if handrails are loose or broken. Make sure that both sides of any steps have handrails.  Any raised decks and porches should have guardrails on the edges.  Have any leaves, snow, or ice cleared regularly.  Use sand or salt on walking paths during winter.  Clean up any spills in your garage right away. This includes oil or grease spills. What can I do in the bathroom?  Use night lights.  Install grab bars by the toilet and in the tub and shower. Do not use towel bars as grab bars.  Use non-skid mats or decals in the tub or shower.  If you need to sit down in the shower, use a plastic, non-slip stool.  Keep the floor dry. Clean up any water that spills on the floor as soon as it happens.  Remove soap buildup in the tub or shower regularly.  Attach bath mats securely with double-sided non-slip rug tape.  Do not have throw rugs  and other things on the floor that can make you trip. What can I do in the bedroom?  Use night lights.  Make sure that you have a light by your bed that is easy to reach.  Do not use any sheets or blankets that are too big for your bed. They should not hang down onto the floor.  Have a firm chair that has side arms. You can use this for support while you get dressed.  Do not have throw rugs and other things on the floor that can make you trip. What can I do in the kitchen?  Clean up any spills right away.  Avoid walking on wet floors.  Keep items that you use a lot in easy-to-reach places.  If you need to reach something above you, use a strong step stool that has a grab bar.  Keep electrical cords out of the way.  Do not use floor polish or wax that makes floors slippery. If you must use wax, use non-skid floor wax.  Do not have throw rugs and other things on the floor that can make you trip. What can I do with my stairs?  Do not leave any items on the stairs.  Make sure that there are handrails on both sides of the stairs and use them. Fix handrails that are broken or loose. Make sure that handrails are as long as the stairways.  Check any carpeting to make sure that it is firmly attached to the stairs. Fix any carpet that is loose or worn.  Avoid having throw rugs at the top or bottom of the stairs. If you do have throw rugs, attach them to the floor with carpet tape.  Make sure that you have a light switch at the top of the stairs and the bottom of the stairs. If you do not have them, ask someone to add them for you. What else can I do to help prevent falls?  Wear shoes that:  Do not have high heels.  Have rubber bottoms.  Are comfortable and fit you well.  Are closed at the toe. Do not wear sandals.  If you use a stepladder:  Make sure that it is fully opened. Do not climb a closed stepladder.  Make sure that both sides of the stepladder are locked into  place.  Ask someone to hold it for you, if possible.  Clearly mark and make sure that you can see:  Any grab bars or handrails.  First and last steps.  Where the edge of each step is.  Use tools that help you move around (mobility aids) if they are needed. These include:  Canes.  Walkers.  Scooters.  Crutches.  Turn on the lights when you go into a dark area. Replace any light bulbs as soon as they burn out.  Set up your furniture so you have a clear path. Avoid moving your furniture around.  If any of your floors are uneven, fix them.  If there are any pets around you, be aware of where they are.  Review your medicines with your doctor. Some medicines can make you feel dizzy. This can increase your chance of falling. Ask your doctor what other things that you can do to help prevent falls. This information is not intended to replace advice given to you by your health care provider. Make sure you discuss any questions you have with your health care provider. Document Released: 09/29/2009 Document Revised: 05/10/2016 Document Reviewed: 01/07/2015 Elsevier Interactive Patient Education  2017 [employer].

## 2019-07-26 ENCOUNTER — Telehealth: Payer: Self-pay | Admitting: Family Medicine

## 2019-07-26 DIAGNOSIS — E7800 Pure hypercholesterolemia, unspecified: Secondary | ICD-10-CM

## 2019-07-26 DIAGNOSIS — I160 Hypertensive urgency: Secondary | ICD-10-CM

## 2019-07-26 DIAGNOSIS — R002 Palpitations: Secondary | ICD-10-CM

## 2019-07-26 NOTE — Telephone Encounter (Signed)
-----   Message from Ellamae Sia sent at 07/20/2019  3:06 PM EDT ----- Regarding: Lab orders for Monday, 8.10.20 Patient is scheduled for CPX labs, please order future labs, Thanks , Karna Christmas

## 2019-07-27 ENCOUNTER — Other Ambulatory Visit: Payer: Self-pay

## 2019-07-27 ENCOUNTER — Other Ambulatory Visit (INDEPENDENT_AMBULATORY_CARE_PROVIDER_SITE_OTHER): Payer: Medicare Other

## 2019-07-27 ENCOUNTER — Ambulatory Visit: Payer: Medicare Other | Admitting: Allergy and Immunology

## 2019-07-27 DIAGNOSIS — I160 Hypertensive urgency: Secondary | ICD-10-CM | POA: Diagnosis not present

## 2019-07-27 DIAGNOSIS — R002 Palpitations: Secondary | ICD-10-CM | POA: Diagnosis not present

## 2019-07-27 DIAGNOSIS — E7800 Pure hypercholesterolemia, unspecified: Secondary | ICD-10-CM | POA: Diagnosis not present

## 2019-07-27 LAB — COMPREHENSIVE METABOLIC PANEL
ALT: 9 U/L (ref 0–35)
AST: 12 U/L (ref 0–37)
Albumin: 3.7 g/dL (ref 3.5–5.2)
Alkaline Phosphatase: 59 U/L (ref 39–117)
BUN: 16 mg/dL (ref 6–23)
CO2: 26 mEq/L (ref 19–32)
Calcium: 9.4 mg/dL (ref 8.4–10.5)
Chloride: 105 mEq/L (ref 96–112)
Creatinine, Ser: 0.99 mg/dL (ref 0.40–1.20)
GFR: 52.01 mL/min — ABNORMAL LOW (ref >60.00)
Glucose, Bld: 95 mg/dL (ref 70–99)
Potassium: 4.4 mEq/L (ref 3.5–5.1)
Sodium: 140 mEq/L (ref 135–145)
Total Bilirubin: 0.7 mg/dL (ref 0.2–1.2)
Total Protein: 6.1 g/dL (ref 6.0–8.3)

## 2019-07-27 LAB — CBC WITH DIFFERENTIAL/PLATELET
Basophils Absolute: 0.1 10*3/uL (ref 0.0–0.1)
Basophils Relative: 0.8 % (ref 0.0–3.0)
Eosinophils Absolute: 0.1 10*3/uL (ref 0.0–0.7)
Eosinophils Relative: 1.8 % (ref 0.0–5.0)
HCT: 37.9 % (ref 36.0–46.0)
Hemoglobin: 12.7 g/dL (ref 12.0–15.0)
Lymphocytes Relative: 25.1 % (ref 12.0–46.0)
Lymphs Abs: 1.9 10*3/uL (ref 0.7–4.0)
MCHC: 33.4 g/dL (ref 30.0–36.0)
MCV: 87.6 fl (ref 78.0–100.0)
Monocytes Absolute: 0.6 10*3/uL (ref 0.1–1.0)
Monocytes Relative: 8.2 % (ref 3.0–12.0)
Neutro Abs: 4.7 10*3/uL (ref 1.4–7.7)
Neutrophils Relative %: 64.1 % (ref 43.0–77.0)
Platelets: 226 10*3/uL (ref 150.0–400.0)
RBC: 4.32 Mil/uL (ref 3.87–5.11)
RDW: 14.4 % (ref 11.5–15.5)
WBC: 7.4 10*3/uL (ref 4.0–10.5)

## 2019-07-27 LAB — LIPID PANEL
Cholesterol: 227 mg/dL — ABNORMAL HIGH (ref 0–200)
HDL: 43.8 mg/dL (ref >39.00)
LDL Cholesterol: 154 mg/dL — ABNORMAL HIGH (ref 0–99)
NonHDL: 182.87
Total CHOL/HDL Ratio: 5
Triglycerides: 146 mg/dL (ref 0.0–149.0)
VLDL: 29.2 mg/dL (ref 0.0–40.0)

## 2019-07-27 LAB — TSH: TSH: 3.16 u[IU]/mL (ref 0.35–4.50)

## 2019-07-29 ENCOUNTER — Telehealth: Payer: Self-pay

## 2019-07-29 NOTE — Telephone Encounter (Signed)
RN called daughter to inquire if daughter [name]) wanted home or telehealth visit for patient.  Linda requests telephonic visit for patient in the AM.  [name] states her brother who lives close by has been tested for CoV19 and does not want to risk exposing palliative team.  Daughter states she is hopeful that team can visit when weather is cooler and everyone can sit outside and visit.  Daughter informed nurse and social worker would make telephonic visit in the AM.

## 2019-07-30 ENCOUNTER — Other Ambulatory Visit: Payer: Self-pay

## 2019-07-30 ENCOUNTER — Other Ambulatory Visit: Payer: Medicare Other

## 2019-07-30 ENCOUNTER — Ambulatory Visit: Payer: Medicare Other

## 2019-07-30 DIAGNOSIS — Z515 Encounter for palliative care: Secondary | ICD-10-CM

## 2019-07-30 NOTE — Progress Notes (Signed)
PATIENT NAME: Ashlee Reyes DOB: [birth year] MRN: [id number]  PRIMARY CARE PROVIDER: Abner Greenspan, MD  RESPONSIBLE PARTY:  Acct ID - Guarantor Home Phone Work Phone Relationship Acct Type  [id number] [name], [name]  Self P/F     [place], [place], [place] [address]    PLAN OF CARE and INTERVENTIONS:               1.  GOALS OF CARE/ ADVANCE CARE PLANNING:  Remain at home with daughter [name].               2.  PATIENT/CAREGIVER EDUCATION:  Education on fall precautions, education on s/s of infection, reviewed meds.               3.  DISEASE STATUS: Due to the COVID-19 crisis, this visit was done via telephonic from my office and it was initiated and consent by this patient and or family. SW and RN met with patient and daughter via telephone for Telehealth visit per daughter's request. Patient will celebrate her 5th birthday tomorrow.  Daughter reports her brother's test for covid-19  was negative.  Patient son who is at [facility] has recovered from [place].  Patient denies having pain at the present time. Patient continues to take Tramadol for left leg pain and back pain. Daughter reports patient has not been complaining of left shoulder pain as frequently. Patient continues to ambulate with her rollator walker and hold onto furniture when she walks per daughter. Daughter denies patient suffering any falls. Patient continues to have difficulty breathing and has used her albuterol a few times per daughter. Patient has not had any cough. Patients appetite remains good and patients current weight is 177 lb. Patient continues to experience nausea frequently. Daughter administers Zofran or gives patient something to eat to control nausea. Daughter feels like nausea could be related to reflux. Patient has been resting well and naps frequently during the day. Daughter reports mornings are better for patient as patient has more confusion as the day progresses. Patient memory is  poor and patient does not recognize daughter at times. Nurse reviewed patient's medications with daughter. Patient had suffered a insect bite in June, daughter reports area has healed. Daughter remains in agreement with palliative care services. Daughter encouraged to contact palliative care team with questions or concerns. Daughter obtained patient's vital signs prior to telephonic visit.     HISTORY OF PRESENT ILLNESS:    CODE STATUS:DNR   ADVANCED DIRECTIVES: Y MOST FORM: No PPS: 40%   PHYSICAL EXAM:   VITALS: See vital signs, daughter obtained patient's VSprior to telephonic visit  LUNGS: Shortness of breath on exertion/ no cough CARDIAC:  EXTREMITIES: 1+ edema SKIN: Skin color, texture, turgor normal. No rashes or lesions  NEURO: positive for gait problems, memory problems and weakness       Nilda Simmer, RN

## 2019-07-30 NOTE — Progress Notes (Signed)
COMMUNITY PALLIATIVE CARE SW NOTE  PATIENT NAME: Ashlee Reyes DOB: 1921/08/03 MRN: [id number]  PRIMARY CARE PROVIDER: Abner Greenspan, MD  RESPONSIBLE PARTY:  Acct ID - Guarantor Home Phone Work Phone Relationship Acct Type  [id number] BRADEN, [phone]  Self P/F     [address], [place], [place] [address]     PLAN OF CARE and INTERVENTIONS:             1. GOALS OF CARE/ ADVANCE CARE PLANNING:  Patient is a DNR, form is in the home. Patient's goal is to remain at home with daughter. 2. SOCIAL/EMOTIONAL/SPIRITUAL ASSESSMENT/ INTERVENTIONS:  SW and RN completed TELEHEALTH visit with patient and Ashlee Reyes (patient's daughter). Patient reports that she is doing "good". Ashlee Reyes said patient's weight is up to 177 today but vital signs look good. Patient is sleeping well. Patient continues to try and eat smalls meals, but is having ongoing nausea. Ashlee Reyes said patient has trouble with breathing at times, and is using her albuterol. Patient has pain in left leg and back but Ashlee Reyes said the pain seems to be better overall. Ashlee Reyes helps with her two brothers who are in local facilities, and has just moved her aunt into [place]. Patient is turning 88 tomorrow and Ashlee Reyes and patient plan to celebrate her birthday with ice cream.  3. PATIENT/CAREGIVER EDUCATION/ COPING:  Patient is coping well. Ashlee Reyes expressed feeling "tired" due to caring for multiple family members. Team encouraged self-care and rest.  4. PERSONAL EMERGENCY PLAN:  Family will call 9-1-1 for emergencies. Due to COVID-19, Ashlee Reyes verbalized that patient is remaining at home and limiting contacts.  5. COMMUNITY RESOURCES COORDINATION/ HEALTH CARE NAVIGATION:  Ashlee Reyes coordinates patient's care, no needs at this time.  6. FINANCIAL/LEGAL CONCERNS/INTERVENTIONS:  None.     SOCIAL HX:  Social History   Tobacco Use  . Smoking status: Never Smoker  . Smokeless tobacco: Never Used  Substance Use Topics  . Alcohol use: No    Alcohol/week:  0.0 standard drinks    CODE STATUS:   Code Status: Prior (DNR) ADVANCED DIRECTIVES: N MOST FORM COMPLETE:  No HOSPICE EDUCATION PROVIDED: None.  PPS: Patient needs assistance with bathing and dressing. Patient is able to toilet and feed herself.    Due to the COVID-19 crisis, this visit was done viatelephonefrom my office and it was initiated and consented by this patient and/or family.This was a scheduled visit.  I spent74minutes with patient/family, from 10:00-10:30aproviding education, support and consultation.  Ashlee Lombard, LCSW

## 2019-07-31 ENCOUNTER — Ambulatory Visit (INDEPENDENT_AMBULATORY_CARE_PROVIDER_SITE_OTHER): Payer: Medicare Other | Admitting: Family Medicine

## 2019-07-31 ENCOUNTER — Encounter: Payer: Self-pay | Admitting: Family Medicine

## 2019-07-31 VITALS — BP 114/65 | Temp 97.5°F | Wt 177.0 lb

## 2019-07-31 DIAGNOSIS — E7800 Pure hypercholesterolemia, unspecified: Secondary | ICD-10-CM

## 2019-07-31 DIAGNOSIS — K219 Gastro-esophageal reflux disease without esophagitis: Secondary | ICD-10-CM | POA: Diagnosis not present

## 2019-07-31 DIAGNOSIS — N390 Urinary tract infection, site not specified: Secondary | ICD-10-CM | POA: Diagnosis not present

## 2019-07-31 DIAGNOSIS — I714 Abdominal aortic aneurysm, without rupture, unspecified: Secondary | ICD-10-CM

## 2019-07-31 DIAGNOSIS — M8589 Other specified disorders of bone density and structure, multiple sites: Secondary | ICD-10-CM | POA: Diagnosis not present

## 2019-07-31 DIAGNOSIS — R0989 Other specified symptoms and signs involving the circulatory and respiratory systems: Secondary | ICD-10-CM | POA: Insufficient documentation

## 2019-07-31 DIAGNOSIS — Z515 Encounter for palliative care: Secondary | ICD-10-CM | POA: Diagnosis not present

## 2019-07-31 NOTE — Patient Instructions (Addendum)
Make sure to get a flu shot in the fall   Continue to watch blood pressure We will mail a copy of your labs   Happy birthday!

## 2019-07-31 NOTE — Progress Notes (Addendum)
Virtual Visit via Video Note  I connected with Ashlee Reyes on 07/31/19 at  9:30 AM EDT by a phone call and verified that I am speaking with the correct person using two identifiers.  Location: Patient: home Provider: office    I discussed the limitations of evaluation and management by telemedicine and the availability of in person appointments. The patient expressed understanding and agreed to proceed.  History of Present Illness: Pt is here for annual f/u of chronic medical problems   She is "doing fine" overall  Nothing new going on   85rd bday tomorrow    Weight  Wt Readings from Last 3 Encounters:  07/30/19 177 lb (80.3 kg)  07/24/19 173 lb (78.5 kg)  05/28/19 174 lb 9.6 oz (79.2 kg)   Eating and sitting  Will get out more when it cools down  Uneven ground to walk one   Vitals 114/65 recently  End of July went up to 167/119  140/74   Flu shot -will get in the fall   Not getting mammograms due to age  Not getting pna 23 due to rxn to prevnar  Tetanus shot 8/13   dexa 10/11-osteopenia H/o L1 comp fx  Declines further eval or tx  Falls-none  fx-none  Supplements - taking ca and vit D   OA -takes tramadol  Also spinal stenosis Mobility impaired Under palliative care now   Cardiology/vascular  Has vasc dz Intol of statins  Has hx of labile bp -has norvasc to take  Taking plavix  Has stens for AAA   Gerd and ibs  Taking pepcid and align Also nexium  Doing fine as long as she eats (this helps nausea)  No HA or dizziness    freq uti and also IC -nocturia 2 times per night  Takes trimethoprim for prophylaxis  Lab Results  Component Value Date   CREATININE 0.99 07/27/2019   BUN 16 07/27/2019   NA 140 07/27/2019   K 4.4 07/27/2019   CL 105 07/27/2019   CO2 26 07/27/2019   Hyperlipidemia Lab Results  Component Value Date   CHOL 227 (H) 07/27/2019   CHOL 218 (H) 12/26/2018   CHOL 240 (H) 07/08/2018   Lab Results  Component Value  Date   HDL 43.80 07/27/2019   HDL 40 (L) 12/26/2018   HDL 54.30 07/08/2018   Lab Results  Component Value Date   LDLCALC 154 (H) 07/27/2019   LDLCALC 149 (H) 12/26/2018   LDLCALC 164 (H) 07/08/2018   Lab Results  Component Value Date   TRIG 146.0 07/27/2019   TRIG 147 12/26/2018   TRIG 110.0 07/08/2018   Lab Results  Component Value Date   CHOLHDL 5 07/27/2019   CHOLHDL 5.5 12/26/2018   CHOLHDL 4 07/08/2018   Lab Results  Component Value Date   LDLDIRECT 161.0 02/06/2016   LDLDIRECT 150.5 01/06/2013   LDLDIRECT 173.9 12/05/2010   Cannot take statins  Eating a little more Does not fry food  Some bacon lately-plans to stop it  Mostly chicken  Lots of vegetable   Patient Active Problem List   Diagnosis Date Noted  . Labile blood pressure 07/31/2019  . Insect bite of right lower leg 06/03/2019  . Allergic rhinitis 02/20/2019  . Palliative care status 01/12/2019  . Hypertensive urgency 12/24/2018  . Compression fracture of L1 lumbar vertebra (HCC) 09/26/2018  . Mobility impaired 09/26/2018  . History of endovascular stent graft for abdominal aortic aneurysm (AAA) 01/01/2018  . Unilateral  primary osteoarthritis, left knee 07/11/2017  . Hyperlipidemia 03/12/2017  . Frequent UTI/interstitial cystitis 02/06/2016  . Hypotension 01/09/2016  . Carotid stenosis 06/01/2015  . Local reaction to immunization 02/21/2015  . Encounter for Medicare annual wellness exam 02/15/2015  . Estrogen deficiency 02/15/2015  . Sinusitis, chronic 01/12/2015  . IBS (irritable bowel syndrome) 01/12/2015  . Orthostatic hypotension 01/11/2015  . Palpitations 05/13/2014  . Second degree heart block 10/18/2013  . Syncope 10/17/2013  . Aneurysm of abdominal vessel ([place]) 06/25/2013  . Osteopenia 01/13/2013  . CAD (coronary artery disease)   . Chronic depression 06/03/2007  . GERD 06/02/2007  . Asthma   . Chronic interstitial cystitis   . Osteoarthritis   . Spinal stenosis of lumbar region    . History of urinary tract infection    Past Medical History:  Diagnosis Date  . AAA (abdominal aortic aneurysm) ([place])    followed by Dr Oneida Alar. Last u/s07/10 stable AAA with largest measurement 3.97 x 4.02cm.  . Allergic rhinitis, seasonal   . Asthma   . Atrial fibrillation ([place])   . CAD (coronary artery disease) 07/2009   mod by cath 08/10  . Complication of anesthesia   . Depression   . Disc disease, degenerative, lumbar or lumbosacral    SPINAL STENOSIS  . Diverticulosis   . GERD (gastroesophageal reflux disease)   . Hemorrhoids   . History of recurrent UTIs   . Hyperlipidemia   . IBS (irritable bowel syndrome)   . IC (irritable colon)    DMSO in past  . Interstitial cystitis   . Osteoarthritis    spine/spinal stenosis  . Osteopenia 2011  . PONV (postoperative nausea and vomiting)   . Skin cancer    basal cell cancer - face.   Past Surgical History:  Procedure Laterality Date  . ABDOMINAL AORTIC ENDOVASCULAR STENT GRAFT N/A 07/01/2013   Procedure: ABDOMINAL AORTIC ENDOVASCULAR STENT GRAFT- GORE;  Surgeon: Elam Dutch, MD;  Location: [facility];  Service: Vascular;  Laterality: N/A;  Ultrasound guided  . ABDOMINAL HYSTERECTOMY     partial not cancer  . BREAST SURGERY     lumpectomy  . CYSTOCELE REPAIR    . ESOPHAGOGASTRODUODENOSCOPY  09/1998   erosive esphagitis ? stricture treated 12/2002  . HERNIA REPAIR  50/5397   umbilical hernia  . RECTOCELE REPAIR    . REFRACTIVE SURGERY     for film after cataract surgery.  Marland Kitchen RIGHT COLECTOMY  1997   benign  . SKIN LESION EXCISION  05/2011   basal cell skin lesion removed.   Social History   Tobacco Use  . Smoking status: Never Smoker  . Smokeless tobacco: Never Used  Substance Use Topics  . Alcohol use: No    Alcohol/week: 0.0 standard drinks  . Drug use: No   Family History  Problem Relation Age of Onset  . Heart disease Mother        deceased age 47 heart attack.  Marland Kitchen Heart attack Mother   . Other Mother         varicose veins  . Varicose Veins Mother   . Heart disease Father        age 52 heart attack  . Cancer Father   . Stroke Brother   . Cancer Brother   . Heart disease Brother   . Heart attack Brother   . Cancer Daughter   . Diabetes Son    Allergies  Allergen Reactions  . Amoxicillin-Pot Clavulanate Nausea And Vomiting  Has patient had a PCN reaction causing immediate rash, facial/tongue/throat swelling, SOB or lightheadedness with hypotension: No Has patient had a PCN reaction causing severe rash involving mucus membranes or skin necrosis: No Has patient had a PCN reaction that required hospitalization: No Has patient had a PCN reaction occurring within the last 10 years: No If all of the above answers are "NO", then may proceed with Cephalosporin use.   . Avelox [Moxifloxacin] Nausea And Vomiting  . Flovent Hfa [Fluticasone] Other (See Comments)    "makes her feel funny" per family member  . Ibuprofen Other (See Comments)    GI upset  . Keflex [Cephalexin] Other (See Comments)    abd pain / GI upset  . Nitrofurantoin Other (See Comments)    Chest pain or indigestion with nausea  . Omeprazole Nausea Only  . Prevnar [Pneumococcal 13-Val Conj Vacc] Itching    Severe local reaction with redness and itching   . Rosuvastatin Other (See Comments)    Foot pain  . Sertraline Hcl Other (See Comments)    Made her more depressed  . Tetanus Toxoids     Local redness and swelling    Current Outpatient Medications on File Prior to Visit  Medication Sig Dispense Refill  . acetaminophen (TYLENOL) 500 MG tablet Take 1 tablet (500 mg total) by mouth every 4 (four) hours as needed for mild pain. 30 tablet 0  . albuterol (VENTOLIN HFA) 108 (90 Base) MCG/ACT inhaler INHALE 2 PUFFS BY MOUTH EVERY 4 HOURS AS NEEDED FOR WHEEZING 18 g 1  . amLODipine (NORVASC) 2.5 MG tablet Take one pill by mouth daily as needed for elevated blood pressure (over 145/90) 30 tablet 11  . beclomethasone  (QVAR REDIHALER) 80 MCG/ACT inhaler Inhale 2 puffs into the lungs 2 (two) times daily. 1 Inhaler 0  . cetirizine (ZYRTEC) 10 MG tablet TAKE 1 TABLET BY MOUTH EVERYDAY AS NEEDED 30 tablet 4  . clopidogrel (PLAVIX) 75 MG tablet Take 1 tablet (75 mg total) by mouth daily. 30 tablet 11  . docusate sodium (COLACE) 100 MG capsule Take 1 capsule (100 mg total) by mouth every evening. 10 capsule 0  . doxycycline (VIBRA-TABS) 100 MG tablet Take 1 tablet (100 mg total) by mouth 2 (two) times daily. (Patient not taking: Reported on 07/24/2019) 14 tablet 0  . esomeprazole (NEXIUM) 40 MG capsule Take 1 capsule (40 mg total) by mouth 2 (two) times daily before a meal. 60 capsule 11  . famotidine (PEPCID) 40 MG tablet Take 1 tablet (40 mg total) by mouth daily. 30 tablet 5  . fluticasone (FLONASE) 50 MCG/ACT nasal spray Place 2 sprays into both nostrils daily. 16 g 11  . LUMIGAN 0.01 % SOLN Place 1 drop into both eyes at bedtime.    . ondansetron (ZOFRAN ODT) 4 MG disintegrating tablet Take 1 tablet (4 mg total) by mouth every 8 (eight) hours as needed for nausea or vomiting. 20 tablet 2  . Polyethyl Glycol-Propyl Glycol (SYSTANE OP) Place 1 drop into both eyes every 8 (eight) hours as needed (dry eyes).     . polyethylene glycol (MIRALAX / GLYCOLAX) packet Take 17 g by mouth daily as needed for mild constipation. 14 each 0  . Probiotic Product (ALIGN) 4 MG CAPS Take 1 capsule (4 mg total) by mouth daily.    . traMADol (ULTRAM) 50 MG tablet TAKE 1/2 TABLET BY MOUTH EVERY MORNING AND 1 TABLET AT BEDTIME 45 tablet 0  . triamcinolone cream (KENALOG) 0.1 %  Apply 1 application topically 2 (two) times daily as needed. To affected area (Patient not taking: Reported on 07/24/2019) 15 g 0  . trimethoprim (TRIMPEX) 100 MG tablet Take 1 tablet (100 mg total) by mouth daily with lunch. 30 tablet 0   No current facility-administered medications on file prior to visit.     Observations/Objective: The patient sounds well/no  distress Family helps her with call due to hearing limitations Nl affect/sounds content  Cognitively intact-no changes /answers questions appropriately Not hoarse , baseline voice Not sob or coughing during interview    Assessment and Plan: Problem List Items Addressed This Visit      Cardiovascular and Mediastinum   Aneurysm of abdominal vessel (HCC) (Chronic)    No clinical changes  Pt cannot take statins due to intolerance / BP is controlled but labile        Digestive   GERD (Chronic)    Taking nexium and pepcid No clinical changes - periodic nausea (eating regularly helps this)        Musculoskeletal and Integument   Osteopenia    dexa 2011 Declines another one or any tx  Has had a comp fx in spine in the past No new falls or fractures Continues ca and D        Genitourinary   Frequent UTI/interstitial cystitis    Doing fairly well on trimethoprim prophylaxis        Other   Hyperlipidemia - Primary    Disc goals for lipids and reasons to control them Rev last labs with pt Rev low sat fat diet in detail Cannot take statins (intol) so LDL is not at goal Disc the diet changes that she can make      Palliative care status    No new developments  Mobility impaired with chronic vascular issues and utis and labile bp Just turned 89      Labile blood pressure    This continues and family watches closely Takes amlodipine as needed for bp over 145/90          Follow Up Instructions:    I discussed the assessment and treatment plan with the patient. The patient was provided an opportunity to ask questions and all were answered. The patient agreed with the plan and demonstrated an understanding of the instructions.   The patient was advised to call back or seek an in-person evaluation if the symptoms worsen or if the condition fails to improve as anticipated.  I provided 24  minutes of non-face-to-face time during this encounter.   Loura Pardon,  MD

## 2019-08-02 ENCOUNTER — Other Ambulatory Visit: Payer: Self-pay | Admitting: Family Medicine

## 2019-08-02 NOTE — Assessment & Plan Note (Addendum)
Taking nexium and pepcid No clinical changes - periodic nausea (eating regularly helps this)

## 2019-08-02 NOTE — Assessment & Plan Note (Signed)
Disc goals for lipids and reasons to control them Rev last labs with pt Rev low sat fat diet in detail Cannot take statins (intol) so LDL is not at goal Disc the diet changes that she can make

## 2019-08-02 NOTE — Assessment & Plan Note (Signed)
No clinical changes  Pt cannot take statins due to intolerance / BP is controlled but labile

## 2019-08-02 NOTE — Assessment & Plan Note (Signed)
Doing fairly well on trimethoprim prophylaxis

## 2019-08-02 NOTE — Assessment & Plan Note (Signed)
This continues and family watches closely Takes amlodipine as needed for bp over 145/90

## 2019-08-02 NOTE — Assessment & Plan Note (Signed)
dexa 2011 Declines another one or any tx  Has had a comp fx in spine in the past No new falls or fractures Continues ca and D

## 2019-08-02 NOTE — Assessment & Plan Note (Addendum)
No new developments  Mobility impaired with chronic vascular issues and utis and labile bp Just turned 89

## 2019-08-03 NOTE — Telephone Encounter (Signed)
Name of Medication:Tramadol 50mg  Name of [place] or Written Date and Quantity: 05/19/19 #45, 0 refills Last Office Visit and Type:CPE on 07/31/19 Next Office Visit and Type:none scheduled  Last Controlled Substance Agreement Date:HOSPICE/PALLIATIVE PATIENT

## 2019-08-16 ENCOUNTER — Other Ambulatory Visit: Payer: Self-pay | Admitting: Family Medicine

## 2019-08-19 ENCOUNTER — Other Ambulatory Visit: Payer: Self-pay | Admitting: Family Medicine

## 2019-08-19 NOTE — Telephone Encounter (Signed)
Last filled on 03/02/19 #20 tabs with 2 refills, CPE/ phone call on 07/31/19

## 2019-09-03 ENCOUNTER — Other Ambulatory Visit: Payer: Medicare Other

## 2019-09-03 ENCOUNTER — Other Ambulatory Visit: Payer: Self-pay

## 2019-09-03 DIAGNOSIS — Z515 Encounter for palliative care: Secondary | ICD-10-CM

## 2019-09-03 NOTE — Progress Notes (Signed)
COMMUNITY PALLIATIVE CARE SW NOTE  PATIENT NAME: Ashlee Reyes DOB: July 12, 1921 MRN: [id number]  PRIMARY CARE PROVIDER: Abner Greenspan, MD  RESPONSIBLE PARTY:  Acct ID - Guarantor Home Phone Work Phone Relationship Acct Type  [id number] MEGANA, [phone]  Self P/F     [address], [place], [place] [address]     PLAN OF CARE and INTERVENTIONS:             1. GOALS OF CARE/ ADVANCE CARE PLANNING:  Patient isaDNR, form is in the home. Patient's goal is to remain at home with daughter. 2. SOCIAL/EMOTIONAL/SPIRITUAL ASSESSMENT/ INTERVENTIONS:  SW completed TELEHEALTH visit with patient and [name] (patient's daughter). Patient states that she is doing "pretty good". Patient continues to struggle with appetite due to nausea, [name] noted that patient seems to be eating less overall. Patient does take PRN medication for nausea and this helps. Patient is down to 172lbs. Patient said the pain continues in her back and hip. It is not constant but seems to flare up in the evenings. Patient takes tylenol during the day for pain and tramadol at night. [name] noted that patient's BP is fluctuating, and has been as high as 164/115. [name] noted she will call PCP, palliative care RN or go to ED if BP continues to rise. [name] said she checks patient's BP multiple times a day. Patient enjoys gardening, going to get ice cream, reading the newspaper and sitting outside. [name] is hopeful that they can go for a drive in October and look at the leaves changing colors.  3. PATIENT/CAREGIVER EDUCATION/ COPING:  Patient was alert, but hard of hearing and had difficulty understanding SW on the phone. Patient's main concern is "feeling bad" due to nausea and pain. Patient copes with this the best that she can. Patient does have short-term memory loss and forgets conversations quickly at times. [name] is supportive of patient and care at home. [name] said that she enjoys mowing the lawn and feels this time is  refreshing because she is outside, away from the phone and can just mow. Linda coordinates care for multiple family members, including patient, and does not take very many breaks. SW encouraged self-care and rest, as able. SW provided education on self-care and caregiver fatigue. SW validated Linda's feelings, provided emotional support and used active and reflective listening.  4. PERSONAL EMERGENCY PLAN:  [name] will call 9-1-1 for emergencies.Due to COVID-19,Lindaverbalized thatpatientis remaining at home and limiting contacts. 5. COMMUNITY RESOURCES COORDINATION/ HEALTH CARE NAVIGATION:  Patient had Medicare Wellness visit via phone this month. Linda coordinates patient's care. No concerns.  6. FINANCIAL/LEGAL CONCERNS/INTERVENTIONS:  None.     SOCIAL HX:  Social History   Tobacco Use  . Smoking status: Never Smoker  . Smokeless tobacco: Never Used  Substance Use Topics  . Alcohol use: No    Alcohol/week: 0.0 standard drinks    CODE STATUS:   Code Status: Prior (DNR) ADVANCED DIRECTIVES: N MOST FORM COMPLETE:  No. HOSPICE EDUCATION PROVIDED: None.  PPS: Patient needs assistance with bathing and dressing. Patient is able to toilet and feed herself. [name] provides standby assistance.   Due to COVID-19, this visit was done viatelephonefrom my office and it was initiated and consented by this patient and/or family.This was a scheduled visit.  I spent2minutes with patient/family, from 10:00-10:30aproviding education, support and consultation.   Margaretmary Lombard, LCSW

## 2019-09-17 DIAGNOSIS — R109 Unspecified abdominal pain: Secondary | ICD-10-CM | POA: Diagnosis not present

## 2019-09-17 DIAGNOSIS — N3020 Other chronic cystitis without hematuria: Secondary | ICD-10-CM | POA: Diagnosis not present

## 2019-09-22 ENCOUNTER — Other Ambulatory Visit: Payer: Self-pay | Admitting: Family Medicine

## 2019-09-23 ENCOUNTER — Telehealth: Payer: Self-pay

## 2019-09-23 NOTE — Telephone Encounter (Signed)
Telephone call to patients daughter [name] to schedule palliative care visit.  Daughter did not answer phone, RN left message requesting daughter call back to schedule palliatuve care visit.

## 2019-09-23 NOTE — Telephone Encounter (Signed)
Name of Medication:Tramadol 50mg  Name of [place] or Written Date and Quantity: 08/03/19 #45 tabs with 0 refills Last Office Visit and Type:CPE on 07/31/19 (phone visit) Next Office Visit and Type:none scheduled  Last Controlled Substance Agreement Date:HOSPICE/PALLIATIVE PATIENT

## 2019-09-24 ENCOUNTER — Telehealth: Payer: Self-pay

## 2019-09-24 NOTE — Telephone Encounter (Signed)
Ashlee Reyes patient's daughter returned tlephone call to schedule palliatuve care visit.  Ashlee Reyes in agreement with palliatuve care team making home visit on Monday 09-28-19 at 1:30 PM.

## 2019-09-28 ENCOUNTER — Other Ambulatory Visit: Payer: Self-pay

## 2019-09-28 ENCOUNTER — Other Ambulatory Visit: Payer: Medicare Other

## 2019-09-28 DIAGNOSIS — Z515 Encounter for palliative care: Secondary | ICD-10-CM

## 2019-09-28 NOTE — Progress Notes (Signed)
PATIENT NAME: Ashlee Reyes DOB: Feb 17, 1921 MRN: [id number]  PRIMARY CARE PROVIDER: Abner Greenspan, MD  RESPONSIBLE PARTY:  Acct ID - Guarantor Home Phone Work Phone Relationship Acct Type  [id number] DERYA, [phone]  Self P/F     [address], [place], [place] [address]    PLAN OF CARE and INTERVENTIONS:               1.  GOALS OF CARE/ ADVANCE CARE PLANNING:  Remain in home with daughter [name] for as long as daughter can manage patients care.               2.  PATIENT/CAREGIVER EDUCATION:  Education on s/s of infection, education on fall precautions, support                3.  DISEASE STATUS: SW and RN made home visit today.  Patient and her daughter [name] met with palliative team on daughters deck.  Patient was able to ambulate with her walker onto deck.  Daughter [name] reports patient has been doing well.  Patient has not suffered any recent falls.  Patients memory remains poor and patient is not able to inform RN what she had for lunch.  Patient denies having any pain at the present time.  Daughter reports patient complained of left knee pain last PM and she applied "some cream" to knee.  Patient also reports she has stomach pain a times after eating.  Patients B/P was elevated in September and first week in October and patient had to take B/P meds per daughter.  Daughter reports patients B/P will be elevated in the late afternoons.  Patient has been sleeping more per daughter.  Patients appetite is good.  Patients current weight is 174 pounds.  Patient has a pencil eraser size raised area in center of right hand.  Daughter feels area may be a ganglion cyst.  Daughter states she will schedule patient MD appointment to have area assessed.  Patient has not had any changes in meds.  Patient has improved since [facility] admission.  Daughter in agreement with palliative care NP following patient since patient is not showing decline.  Daughter informed should patient have a change in status  monthly visits could be resumed.  Daughter informed to call with questions or concerns.                 HISTORY OF PRESENT ILLNESS:  Patient is a 85 year old who resides in home with her daughter [name].  Patient has been stable and will ne transferred to palliative NP.  CODE STATUS: DNR  ADVANCED DIRECTIVES:Y MOST FORM: No PPS: 50%   PHYSICAL EXAM:   VITALS: See vital signs  LUNGS: clear to auscultation  CARDIAC: Cor RRR  EXTREMITIES: Trace edema SKIN: Skin color, texture, turgor normal. No rashes or lesions  NEURO: positive for memory problems       Nilda Simmer, RN

## 2019-09-28 NOTE — Progress Notes (Signed)
COMMUNITY PALLIATIVE CARE SW NOTE  PATIENT NAME: Ashlee Reyes DOB: [birth year] MRN: [id number]  PRIMARY CARE PROVIDER: Abner Greenspan, MD  RESPONSIBLE PARTY:  Acct ID - Guarantor Home Phone Work Phone Relationship Acct Type  [id number] VIKTORIYA, [phone]  Self P/F     [address], [place], [place] [address]     PLAN OF CARE and INTERVENTIONS:             1. GOALS OF CARE/ ADVANCE CARE PLANNING:  Patient isaDNR, form is in the home. Patient's goal is to remain at home with daughter. 2. SOCIAL/EMOTIONAL/SPIRITUAL ASSESSMENT/ INTERVENTIONS:  SW and RN met with patient and [name] (patient's daughter). Patient was sitting on porch when team arrived. Patient said she is doing "good", but notes that she is feeling tired. Patient has had some knee pain, [name] has rubbed "cream" on it to help. Patient is sleeping well, but seems to be sleeping more per [name]. Patient's appetite varies, but is better overall.   3. PATIENT/CAREGIVER EDUCATION/ COPING:  Patient was alert, but hard of hearing so difficult to engage at times. Patient is coping well. [name] continues to have caregiver fatigue. Team encouraged self-care. SW provided emotional support, and used active and reflective listening.  4. PERSONAL EMERGENCY PLAN:  Familywillcall 9-1-1 for emergencies.Due to COVID-19,Lindaverbalized thatpatientis remaining at home and limiting contacts. 5. COMMUNITY RESOURCES COORDINATION/ HEALTH CARE NAVIGATION:  [name] coordinates patient's care. No concerns.  6. FINANCIAL/LEGAL CONCERNS/INTERVENTIONS:  None.     SOCIAL HX:  Social History   Tobacco Use  . Smoking status: Never Smoker  . Smokeless tobacco: Never Used  Substance Use Topics  . Alcohol use: No    Alcohol/week: 0.0 standard drinks    CODE STATUS:   Code Status: Prior (DNR) ADVANCED DIRECTIVES: N MOST FORM COMPLETE:  No. HOSPICE EDUCATION PROVIDED: None.  PPS: Patient needs assistance with bathing and dressing.  Patient is able to toilet and feed herself.  Due to patient's stability, patient will no longer be followed by [facility] team at this time. Patient will remain with [facility] team and be followed by NP. Patient/family agreeable to plan, no questions/concerns. Daughter confirmed that she will contact [facility] team if anything changes.   I spent64mnutes with patient/family, from 1:30-2:00pproviding education, support and consultation.   WMargaretmary Lombard LCSW

## 2019-10-08 ENCOUNTER — Telehealth: Payer: Self-pay | Admitting: Adult Health Nurse Practitioner

## 2019-10-08 NOTE — Telephone Encounter (Signed)
Called patient's daughter to schedule a Palliative visit with NP, no answer - left message with reason for cal along with my contact information

## 2019-10-09 ENCOUNTER — Telehealth: Payer: Self-pay | Admitting: Adult Health Nurse Practitioner

## 2019-10-09 NOTE — Telephone Encounter (Signed)
Rec'd call back from patient's daughter and we have scheduled a Telephone Palliative f/u visit for 11/02/19 @ 11:30 AM

## 2019-10-15 ENCOUNTER — Ambulatory Visit: Payer: Medicare Other

## 2019-10-20 ENCOUNTER — Other Ambulatory Visit: Payer: Self-pay | Admitting: Allergy and Immunology

## 2019-10-20 DIAGNOSIS — J3089 Other allergic rhinitis: Secondary | ICD-10-CM

## 2019-10-21 ENCOUNTER — Telehealth: Payer: Self-pay | Admitting: Allergy and Immunology

## 2019-10-21 DIAGNOSIS — J3089 Other allergic rhinitis: Secondary | ICD-10-CM

## 2019-10-21 MED ORDER — CETIRIZINE HCL 10 MG PO TABS: ORAL_TABLET | ORAL | 0 refills | Status: DC

## 2019-10-21 NOTE — Telephone Encounter (Signed)
Pt daughter called and said that she needed to have zyrtec called into [facility] on [address] [place]. She have a tele visit with dr Neldon Mc on 04/30/2019. [phone]

## 2019-10-22 ENCOUNTER — Ambulatory Visit (INDEPENDENT_AMBULATORY_CARE_PROVIDER_SITE_OTHER): Payer: Medicare Other

## 2019-10-22 DIAGNOSIS — Z23 Encounter for immunization: Secondary | ICD-10-CM | POA: Diagnosis not present

## 2019-11-02 ENCOUNTER — Other Ambulatory Visit: Payer: Medicare Other | Admitting: Adult Health Nurse Practitioner

## 2019-11-02 ENCOUNTER — Other Ambulatory Visit: Payer: Self-pay

## 2019-11-02 ENCOUNTER — Other Ambulatory Visit: Payer: Self-pay | Admitting: *Deleted

## 2019-11-02 DIAGNOSIS — Z515 Encounter for palliative care: Secondary | ICD-10-CM

## 2019-11-02 DIAGNOSIS — F0390 Unspecified dementia without behavioral disturbance: Secondary | ICD-10-CM | POA: Diagnosis not present

## 2019-11-02 MED ORDER — TRAMADOL HCL 50 MG PO TABS: ORAL_TABLET | ORAL | 0 refills | Status: DC

## 2019-11-02 NOTE — Telephone Encounter (Signed)
Name of Medication:Tramadol 50mg  Name of Pharmacy:[facility] [address] Last Fill or Written Date and Quantity:09/23/19 #45 tabs with 0 refills Last Office Visit and Type:CPE on 07/31/19 (phone visit) Next Office Visit and Type:none scheduled Last Controlled Substance Agreement Date:HOSPICE/PALLIATIVE PATIENT

## 2019-11-02 NOTE — Progress Notes (Signed)
[facility] Consult Note Telephone: [phone]  Fax: [phone]  PATIENT NAME: Ashlee Reyes DOB: 15-Feb-1921 MRN: [id number]  PRIMARY CARE PROVIDER:   Abner Greenspan, MD  REFERRING PROVIDER:  Abner Greenspan, MD [address] [place],  [place] [address]  RESPONSIBLE PARTY:   [name], daughter [phone]  Due to the COVID-19 crisis, this visit was done via telemedicine and it was initiated and consent by this patient and or family. Video-audio (telehealth) contact was unable to be done due to technical barriers from the patient's side.    RECOMMENDATIONS and PLAN:  1.  Advanced care planning.  Patient is a DNR.  No changes made today  2.  Dementia.  Patient is able to ambulate with a walker but uses a wheelchair when having back pain.  She requires assistance with ADLs.  Is able to go to the bathroom by herself and feed herself.  Has occasional incontinence of bladder.  Is continent of bowel.  A&O to person and place.  Daughter denies any anxiety/agitation.  Patient sleeps well.  Continue supportive care.  3.  Hypertension.  Patient's BP this morning 166/117 as reported by daughter.  States that she waits about 15 minutes and then rechecks it.  If still high she gives her amlodipine 2.5 mg. She has parameters to give the amlodipine when SBP over 145 or DBP over 90.  She does not have to give it too often. Denies headache, chest pain, dizziness today. Continue current dose of amlodipine with parameters  Overall patient is stable.  No reports of falls, infections, or hospital visits.  Have telehealth visit in January after the holidays.  Encouraged to call with any concerns.  Palliative will continue to monitor for symptom management and decline and make recommendations as needed.    I spent 25 minutes providing this consultation,  from 11:30 to 11:55. More than 50% of the time in this consultation was spent coordinating  communication.   HISTORY OF PRESENT ILLNESS:  Ashlee Reyes is a 85 y.o. year old female with multiple medical problems including dementia, HTN, OA of left knee, lumbar spinal stenosis, h/o skin cancer. Palliative Care was asked to help address goals of care.   CODE STATUS: DNR  PPS: 50% HOSPICE ELIGIBILITY/DIAGNOSIS: TBD  PHYSICAL EXAM:   Deferred  PAST MEDICAL HISTORY:  Past Medical History:  Diagnosis Date  . AAA (abdominal aortic aneurysm) ([facility])    followed by Dr Oneida Alar. Last u/s07/10 stable AAA with largest measurement 3.97 x 4.02cm.  . Allergic rhinitis, seasonal   . Asthma   . Atrial fibrillation ([facility])   . CAD (coronary artery disease) 07/2009   mod by cath 08/10  . Complication of anesthesia   . Depression   . Disc disease, degenerative, lumbar or lumbosacral    SPINAL STENOSIS  . Diverticulosis   . GERD (gastroesophageal reflux disease)   . Hemorrhoids   . History of recurrent UTIs   . Hyperlipidemia   . IBS (irritable bowel syndrome)   . IC (irritable colon)    DMSO in past  . Interstitial cystitis   . Osteoarthritis    spine/spinal stenosis  . Osteopenia 2011  . PONV (postoperative nausea and vomiting)   . Skin cancer    basal cell cancer - face.    SOCIAL HX:  Social History   Tobacco Use  . Smoking status: Never Smoker  . Smokeless tobacco: Never Used  Substance  Use Topics  . Alcohol use: No    Alcohol/week: 0.0 standard drinks    ALLERGIES:  Allergies  Allergen Reactions  . Amoxicillin-Pot Clavulanate Nausea And Vomiting    Has patient had a PCN reaction causing immediate rash, facial/tongue/throat swelling, SOB or lightheadedness with hypotension: No Has patient had a PCN reaction causing severe rash involving mucus membranes or skin necrosis: No Has patient had a PCN reaction that required hospitalization: No Has patient had a PCN reaction occurring within the last 10 years: No If all of the above answers are "NO", then may proceed  with Cephalosporin use.   . Avelox [Moxifloxacin] Nausea And Vomiting  . Flovent Hfa [Fluticasone] Other (See Comments)    "makes her feel funny" per family member  . Ibuprofen Other (See Comments)    GI upset  . Keflex [Cephalexin] Other (See Comments)    abd pain / GI upset  . Nitrofurantoin Other (See Comments)    Chest pain or indigestion with nausea  . Omeprazole Nausea Only  . Prevnar [Pneumococcal 13-Val Conj Vacc] Itching    Severe local reaction with redness and itching   . Rosuvastatin Other (See Comments)    Foot pain  . Sertraline Hcl Other (See Comments)    Made her more depressed  . Tetanus Toxoids     Local redness and swelling      PERTINENT MEDICATIONS:  Outpatient Encounter Medications as of 11/02/2019  Medication Sig  . acetaminophen (TYLENOL) 500 MG tablet Take 1 tablet (500 mg total) by mouth every 4 (four) hours as needed for mild pain.  Marland Kitchen albuterol (VENTOLIN HFA) 108 (90 Base) MCG/ACT inhaler INHALE 2 PUFFS BY MOUTH EVERY 4 HOURS AS NEEDED FOR WHEEZING  . amLODipine (NORVASC) 2.5 MG tablet Take one pill by mouth daily as needed for elevated blood pressure (over 145/90)  . beclomethasone (QVAR REDIHALER) 80 MCG/ACT inhaler Inhale 2 puffs into the lungs 2 (two) times daily.  . cetirizine (ZYRTEC) 10 MG tablet TAKE 1 TABLET BY MOUTH EVERYDAY AS NEEDED  . clopidogrel (PLAVIX) 75 MG tablet TAKE 1 TABLET(75 MG) BY MOUTH DAILY  . docusate sodium (COLACE) 100 MG capsule Take 1 capsule (100 mg total) by mouth every evening.  Marland Kitchen doxycycline (VIBRA-TABS) 100 MG tablet Take 1 tablet (100 mg total) by mouth 2 (two) times daily. (Patient not taking: Reported on 07/24/2019)  . esomeprazole (NEXIUM) 40 MG capsule Take 1 capsule (40 mg total) by mouth 2 (two) times daily before a meal.  . famotidine (PEPCID) 40 MG tablet Take 1 tablet (40 mg total) by mouth daily.  . fluticasone (FLONASE) 50 MCG/ACT nasal spray Place 2 sprays into both nostrils daily.  Marland Kitchen LUMIGAN 0.01 % SOLN  Place 1 drop into both eyes at bedtime.  . ondansetron (ZOFRAN-ODT) 4 MG disintegrating tablet DISSOLVE 1 TABLET(4 MG) ON THE TONGUE EVERY 8 HOURS AS NEEDED FOR NAUSEA OR VOMITING  . Polyethyl Glycol-Propyl Glycol (SYSTANE OP) Place 1 drop into both eyes every 8 (eight) hours as needed (dry eyes).   . polyethylene glycol (MIRALAX / GLYCOLAX) packet Take 17 g by mouth daily as needed for mild constipation.  . Probiotic Product (ALIGN) 4 MG CAPS Take 1 capsule (4 mg total) by mouth daily.  . traMADol (ULTRAM) 50 MG tablet TAKE 1/2 TABLET BY MOUTH EVERY MORNING AND 1 AT BEDTIME  . triamcinolone cream (KENALOG) 0.1 % Apply 1 application topically 2 (two) times daily as needed. To affected area (Patient not taking: Reported on 07/24/2019)  .  trimethoprim (TRIMPEX) 100 MG tablet Take 1 tablet (100 mg total) by mouth daily with lunch.   No facility-administered encounter medications on file as of 11/02/2019.        Amritpal Shropshire Jenetta Downer, NP

## 2019-11-06 ENCOUNTER — Ambulatory Visit (INDEPENDENT_AMBULATORY_CARE_PROVIDER_SITE_OTHER): Payer: Medicare Other | Admitting: Family Medicine

## 2019-11-06 ENCOUNTER — Encounter: Payer: Self-pay | Admitting: Family Medicine

## 2019-11-06 ENCOUNTER — Telehealth: Payer: Self-pay

## 2019-11-06 ENCOUNTER — Other Ambulatory Visit: Payer: Self-pay

## 2019-11-06 VITALS — BP 138/66 | HR 81 | Temp 97.4°F

## 2019-11-06 DIAGNOSIS — R109 Unspecified abdominal pain: Secondary | ICD-10-CM | POA: Diagnosis not present

## 2019-11-06 DIAGNOSIS — R8290 Unspecified abnormal findings in urine: Secondary | ICD-10-CM | POA: Diagnosis not present

## 2019-11-06 DIAGNOSIS — N390 Urinary tract infection, site not specified: Secondary | ICD-10-CM | POA: Diagnosis not present

## 2019-11-06 LAB — POC URINALSYSI DIPSTICK (AUTOMATED)
Bilirubin, UA: NEGATIVE
Blood, UA: 50
Glucose, UA: NEGATIVE
Ketones, UA: NEGATIVE
Nitrite, UA: NEGATIVE
Protein, UA: NEGATIVE
Spec Grav, UA: 1.01 (ref 1.010–1.025)
Urobilinogen, UA: 0.2 E.U./dL
pH, UA: 6 (ref 5.0–8.0)

## 2019-11-06 NOTE — Telephone Encounter (Signed)
She needs to be seen/and likely labs done for that  First available if I don't have anything

## 2019-11-06 NOTE — Telephone Encounter (Signed)
Also per daughter patient's vitals today were: B/P 136/77 Pulse: 73 O2 93% Temp: 98.1

## 2019-11-06 NOTE — Patient Instructions (Addendum)
Clear fluid diet for the rest of the day Then stick to bland foods Continue miralax  Avoid nuts and seeds   Labs now  Urine test now  Then I will make a plan

## 2019-11-06 NOTE — Telephone Encounter (Signed)
Daughter [name] notified and appt for 2pm today was scheduled

## 2019-11-06 NOTE — Progress Notes (Signed)
Subjective:    Patient ID: Hardin Negus, female    DOB: [birth year], 85 y.o.   MRN: [id number]  This visit occurred during the SARS-CoV-2 public health emergency.  Safety protocols were in place, including screening questions prior to the visit, additional usage of staff PPE, and extensive cleaning of exam room while observing appropriate contact time as indicated for disinfecting solutions.    HPI Pt presents with LLQ abdominal pain   Wt Readings from Last 3 Encounters:  09/28/19 174 lb (78.9 kg)  07/31/19 177 lb (80.3 kg)  07/30/19 177 lb (80.3 kg)     Did not weigh today   Symptoms started yesterday  (? 2-3 d) Has had stomach problems before but this is more persistent   Pain is on the low left side (stays there)  Gets worse as the day goes on  Always a little better in am  breakfast -cereal, cheerios, scrambled egg , banana  Within 2 hours pain starts   Has diverticulosis but never had an episode of bacterial diverticulitis   She did eat some nuts recently (does not eat them that often)   No rectal bleeding /blood in stool lately   Pressure/bloating/gas-baseline   No fever or chills Feels run down (this is often her baseline)   No diarrhea  More constipated than usual  She takes miralax every day  Had a BM this am - normal for her   Cared for by palliative care   She has chronic nausea every day -after she eats   No urinary symptoms  No flank pain  Urine has looked normal and clear  No new frequency or change in urination   Back hurts some   ua today tr leuk and blood /sent for cx   Patient Active Problem List   Diagnosis Date Noted  . Left lateral abdominal pain 11/06/2019  . Labile blood pressure 07/31/2019  . Insect bite of right lower leg 06/03/2019  . Allergic rhinitis 02/20/2019  . Palliative care status 01/12/2019  . Hypertensive urgency 12/24/2018  . Compression fracture of L1 lumbar vertebra (HCC) 09/26/2018  . Mobility impaired  09/26/2018  . History of endovascular stent graft for abdominal aortic aneurysm (AAA) 01/01/2018  . Unilateral primary osteoarthritis, left knee 07/11/2017  . Hyperlipidemia 03/12/2017  . Frequent UTI/interstitial cystitis 02/06/2016  . Hypotension 01/09/2016  . Carotid stenosis 06/01/2015  . Local reaction to immunization 02/21/2015  . Encounter for Medicare annual wellness exam 02/15/2015  . Estrogen deficiency 02/15/2015  . Sinusitis, chronic 01/12/2015  . IBS (irritable bowel syndrome) 01/12/2015  . Orthostatic hypotension 01/11/2015  . Palpitations 05/13/2014  . Second degree heart block 10/18/2013  . Syncope 10/17/2013  . Aneurysm of abdominal vessel ([place]) 06/25/2013  . Osteopenia 01/13/2013  . CAD (coronary artery disease)   . Chronic depression 06/03/2007  . GERD 06/02/2007  . Asthma   . Chronic interstitial cystitis   . Osteoarthritis   . Spinal stenosis of lumbar region   . History of urinary tract infection    Past Medical History:  Diagnosis Date  . AAA (abdominal aortic aneurysm) ([place])    followed by Dr Oneida Alar. Last u/s07/10 stable AAA with largest measurement 3.97 x 4.02cm.  . Allergic rhinitis, seasonal   . Asthma   . Atrial fibrillation ([place])   . CAD (coronary artery disease) 07/2009   mod by cath 08/10  . Complication of anesthesia   . Depression   . Disc disease, degenerative, lumbar or lumbosacral  SPINAL STENOSIS  . Diverticulosis   . GERD (gastroesophageal reflux disease)   . Hemorrhoids   . History of recurrent UTIs   . Hyperlipidemia   . IBS (irritable bowel syndrome)   . IC (irritable colon)    DMSO in past  . Interstitial cystitis   . Osteoarthritis    spine/spinal stenosis  . Osteopenia 2011  . PONV (postoperative nausea and vomiting)   . Skin cancer    basal cell cancer - face.   Past Surgical History:  Procedure Laterality Date  . ABDOMINAL AORTIC ENDOVASCULAR STENT GRAFT N/A 07/01/2013   Procedure: ABDOMINAL AORTIC ENDOVASCULAR  STENT GRAFT- GORE;  Surgeon: Elam Dutch, MD;  Location: [facility];  Service: Vascular;  Laterality: N/A;  Ultrasound guided  . ABDOMINAL HYSTERECTOMY     partial not cancer  . BREAST SURGERY     lumpectomy  . CYSTOCELE REPAIR    . ESOPHAGOGASTRODUODENOSCOPY  09/1998   erosive esphagitis ? stricture treated 12/2002  . HERNIA REPAIR  [id number]   umbilical hernia  . RECTOCELE REPAIR    . REFRACTIVE SURGERY     for film after cataract surgery.  Marland Kitchen RIGHT COLECTOMY  1997   benign  . SKIN LESION EXCISION  05/2011   basal cell skin lesion removed.   Social History   Tobacco Use  . Smoking status: Never Smoker  . Smokeless tobacco: Never Used  Substance Use Topics  . Alcohol use: No    Alcohol/week: 0.0 standard drinks  . Drug use: No   Family History  Problem Relation Age of Onset  . Heart disease Mother        deceased age 31 heart attack.  Marland Kitchen Heart attack Mother   . Other Mother        varicose veins  . Varicose Veins Mother   . Heart disease Father        age 22 heart attack  . Cancer Father   . Stroke Brother   . Cancer Brother   . Heart disease Brother   . Heart attack Brother   . Cancer Daughter   . Diabetes Son    Allergies  Allergen Reactions  . Amoxicillin-Pot Clavulanate Nausea And Vomiting    Has patient had a PCN reaction causing immediate rash, facial/tongue/throat swelling, SOB or lightheadedness with hypotension: No Has patient had a PCN reaction causing severe rash involving mucus membranes or skin necrosis: No Has patient had a PCN reaction that required hospitalization: No Has patient had a PCN reaction occurring within the last 10 years: No If all of the above answers are "NO", then may proceed with Cephalosporin use.   . Avelox [Moxifloxacin] Nausea And Vomiting  . Flovent Hfa [Fluticasone] Other (See Comments)    "makes her feel funny" per family member  . Ibuprofen Other (See Comments)    GI upset  . Keflex [Cephalexin] Other (See Comments)     abd pain / GI upset  . Nitrofurantoin Other (See Comments)    Chest pain or indigestion with nausea  . Omeprazole Nausea Only  . Prevnar [Pneumococcal 13-Val Conj Vacc] Itching    Severe local reaction with redness and itching   . Rosuvastatin Other (See Comments)    Foot pain  . Sertraline Hcl Other (See Comments)    Made her more depressed  . Tetanus Toxoids     Local redness and swelling    Current Outpatient Medications on File Prior to Visit  Medication Sig Dispense Refill  .  acetaminophen (TYLENOL) 500 MG tablet Take 1 tablet (500 mg total) by mouth every 4 (four) hours as needed for mild pain. 30 tablet 0  . albuterol (VENTOLIN HFA) 108 (90 Base) MCG/ACT inhaler INHALE 2 PUFFS BY MOUTH EVERY 4 HOURS AS NEEDED FOR WHEEZING 18 g 1  . amLODipine (NORVASC) 2.5 MG tablet Take one pill by mouth daily as needed for elevated blood pressure (over 145/90) 30 tablet 11  . beclomethasone (QVAR REDIHALER) 80 MCG/ACT inhaler Inhale 2 puffs into the lungs 2 (two) times daily. 1 Inhaler 0  . cetirizine (ZYRTEC) 10 MG tablet TAKE 1 TABLET BY MOUTH EVERYDAY AS NEEDED 30 tablet 0  . clopidogrel (PLAVIX) 75 MG tablet TAKE 1 TABLET(75 MG) BY MOUTH DAILY 30 tablet 11  . docusate sodium (COLACE) 100 MG capsule Take 1 capsule (100 mg total) by mouth every evening. 10 capsule 0  . esomeprazole (NEXIUM) 40 MG capsule Take 1 capsule (40 mg total) by mouth 2 (two) times daily before a meal. 60 capsule 11  . famotidine (PEPCID) 40 MG tablet Take 1 tablet (40 mg total) by mouth daily. 30 tablet 5  . fluticasone (FLONASE) 50 MCG/ACT nasal spray Place 2 sprays into both nostrils daily. 16 g 11  . LUMIGAN 0.01 % SOLN Place 1 drop into both eyes at bedtime.    . ondansetron (ZOFRAN-ODT) 4 MG disintegrating tablet DISSOLVE 1 TABLET(4 MG) ON THE TONGUE EVERY 8 HOURS AS NEEDED FOR NAUSEA OR VOMITING 20 tablet 3  . Polyethyl Glycol-Propyl Glycol (SYSTANE OP) Place 1 drop into both eyes every 8 (eight) hours as needed  (dry eyes).     . polyethylene glycol (MIRALAX / GLYCOLAX) packet Take 17 g by mouth daily as needed for mild constipation. 14 each 0  . Probiotic Product (ALIGN) 4 MG CAPS Take 1 capsule (4 mg total) by mouth daily.    . traMADol (ULTRAM) 50 MG tablet TAKE 1/2 TABLET BY MOUTH EVERY MORNING AND 1 AT BEDTIME 45 tablet 0  . trimethoprim (TRIMPEX) 100 MG tablet Take 1 tablet (100 mg total) by mouth daily with lunch. 30 tablet 0   No current facility-administered medications on file prior to visit.     Review of Systems  Constitutional: Positive for fatigue. Negative for activity change, appetite change, fever and unexpected weight change.  HENT: Negative for congestion, ear pain, rhinorrhea, sinus pressure and sore throat.   Eyes: Negative for pain, redness and visual disturbance.  Respiratory: Negative for cough, shortness of breath and wheezing.   Cardiovascular: Negative for chest pain and palpitations.  Gastrointestinal: Positive for abdominal pain. Negative for abdominal distention, anal bleeding, blood in stool, constipation, diarrhea, nausea, rectal pain and vomiting.  Endocrine: Negative for polydipsia and polyuria.  Genitourinary: Negative for dysuria, frequency and urgency.  Musculoskeletal: Positive for arthralgias. Negative for back pain and myalgias.  Skin: Negative for pallor and rash.  Allergic/Immunologic: Negative for environmental allergies.  Neurological: Negative for dizziness, syncope and headaches.  Hematological: Negative for adenopathy. Does not bruise/bleed easily.  Psychiatric/Behavioral: Negative for decreased concentration and dysphoric mood. The patient is not nervous/anxious.        Objective:   Physical Exam Constitutional:      General: She is not in acute distress.    Appearance: Normal appearance. She is well-developed. She is obese.     Comments: Not in distress   HENT:     Head: Normocephalic and atraumatic.  Eyes:     General: No scleral icterus.  Conjunctiva/sclera: Conjunctivae normal.     Pupils: Pupils are equal, round, and reactive to light.  Neck:     Musculoskeletal: Normal range of motion and neck supple.  Cardiovascular:     Rate and Rhythm: Normal rate and regular rhythm.     Heart sounds: Normal heart sounds.  Pulmonary:     Effort: Pulmonary effort is normal. No respiratory distress.     Breath sounds: Normal breath sounds. No wheezing or rales.  Abdominal:     General: Abdomen is flat. Bowel sounds are normal. There is no distension.     Palpations: Abdomen is soft. There is no hepatomegaly, splenomegaly, mass or pulsatile mass.     Tenderness: There is abdominal tenderness in the left upper quadrant and left lower quadrant. There is no right CVA tenderness, left CVA tenderness, guarding or rebound. Negative signs include Murphy's sign and McBurney's sign.     Hernia: No hernia is present.     Comments: Very slight left sided abd tenderness  Pt points to left flank area re: location of pain but no cva tenderness   Musculoskeletal:     Right lower leg: No edema.     Left lower leg: No edema.  Lymphadenopathy:     Cervical: No cervical adenopathy.  Skin:    General: Skin is warm and dry.     Coloration: Skin is not pale.     Findings: No erythema or rash.  Neurological:     Mental Status: She is alert. Mental status is at baseline.     Coordination: Coordination normal.     Deep Tendon Reflexes: Reflexes normal.  Psychiatric:        Mood and Affect: Mood normal.           Assessment & Plan:   Problem List Items Addressed This Visit      Genitourinary   Frequent UTI/interstitial cystitis    ua with trace leuk and blood sent for cx ? If L flank pain-is hard to tell on exam  Enc fluid intake       Relevant Orders   POCT Urinalysis Dipstick (Automated) (Completed)   Urine culture (Completed)     Other   Left lateral abdominal pain - Primary    Unclear whether this is primarily upper/lower or  flank  Very slight tenderness on exam -moreso in upper abd/flank (not at all in lower abd or suprapubic) Pending ua/cx and cbc/lipase /chem today   If wbc elevated-consider flagyl (if severe/ CT)  inst to update if symptoms worsen inst to follow liquid diet today followed by bland as tolerated and update       Relevant Orders   CBC w/Diff (Completed)   Comprehensive metabolic panel (Completed)   Lipase (Completed)    Other Visit Diagnoses    Abnormal urinalysis       Relevant Orders   Urine culture (Completed)

## 2019-11-06 NOTE — Telephone Encounter (Signed)
Patient's daughter [name], called and states patient has had a LLQ constant nagging pain for 3 days. Pain is different from her usual stomach pain and issues. More nauseas. No fever, no vomiting, no blood in the stool. Patient is eating ok. Pain is not radiating to any other place that [name] can tell. Patient has been taking Gas X and anti acids. Nothing is helping. Feels like maybe its diverticulitis? Can they get antibiotic? [name] would rather not bring patient in if she can help it. Please review.

## 2019-11-07 LAB — CBC WITH DIFFERENTIAL/PLATELET
Absolute Monocytes: 746 cells/uL (ref 200–950)
Basophils Absolute: 71 cells/uL (ref 0–200)
Basophils Relative: 1 %
Eosinophils Absolute: 128 cells/uL (ref 15–500)
Eosinophils Relative: 1.8 %
HCT: 37.8 % (ref 35.0–45.0)
Hemoglobin: 12.4 g/dL (ref 11.7–15.5)
Lymphs Abs: 2201 cells/uL (ref 850–3900)
MCH: 28.5 pg (ref 27.0–33.0)
MCHC: 32.8 g/dL (ref 32.0–36.0)
MCV: 86.9 fL (ref 80.0–100.0)
MPV: 11 fL (ref 7.5–12.5)
Monocytes Relative: 10.5 %
Neutro Abs: 3955 cells/uL (ref 1500–7800)
Neutrophils Relative %: 55.7 %
Platelets: 236 10*3/uL (ref 140–400)
RBC: 4.35 10*6/uL (ref 3.80–5.10)
RDW: 13.1 % (ref 11.0–15.0)
Total Lymphocyte: 31 %
WBC: 7.1 10*3/uL (ref 3.8–10.8)

## 2019-11-07 LAB — URINE CULTURE
MICRO NUMBER:: 1124215
Result:: NO GROWTH
SPECIMEN QUALITY:: ADEQUATE

## 2019-11-07 LAB — COMPREHENSIVE METABOLIC PANEL
AG Ratio: 1.6 (calc) (ref 1.0–2.5)
ALT: 8 U/L (ref 6–29)
AST: 14 U/L (ref 10–35)
Albumin: 3.9 g/dL (ref 3.6–5.1)
Alkaline phosphatase (APISO): 56 U/L (ref 37–153)
BUN/Creatinine Ratio: 18 (calc) (ref 6–22)
BUN: 16 mg/dL (ref 7–25)
CO2: 27 mmol/L (ref 20–32)
Calcium: 9.3 mg/dL (ref 8.6–10.4)
Chloride: 103 mmol/L (ref 98–110)
Creat: 0.9 mg/dL — ABNORMAL HIGH (ref 0.60–0.88)
Globulin: 2.4 g/dL (calc) (ref 1.9–3.7)
Glucose, Bld: 90 mg/dL (ref 65–99)
Potassium: 4.4 mmol/L (ref 3.5–5.3)
Sodium: 139 mmol/L (ref 135–146)
Total Bilirubin: 0.3 mg/dL (ref 0.2–1.2)
Total Protein: 6.3 g/dL (ref 6.1–8.1)

## 2019-11-07 LAB — LIPASE: Lipase: 19 U/L (ref 7–60)

## 2019-11-08 NOTE — Assessment & Plan Note (Addendum)
Unclear whether this is primarily upper/lower or flank  Very slight tenderness on exam -moreso in upper abd/flank (not at all in lower abd or suprapubic) Pending ua/cx and cbc/lipase /chem today   If wbc elevated-consider flagyl (if severe/ CT)  inst to update if symptoms worsen inst to follow liquid diet today followed by bland as tolerated and update

## 2019-11-08 NOTE — Assessment & Plan Note (Signed)
ua with trace leuk and blood sent for cx ? If L flank pain-is hard to tell on exam  Enc fluid intake

## 2019-11-09 ENCOUNTER — Telehealth: Payer: Self-pay | Admitting: Family Medicine

## 2019-11-09 MED ORDER — METRONIDAZOLE 500 MG PO TABS: 500.0000 mg | ORAL_TABLET | Freq: Three times a day (TID) | ORAL | 0 refills | Status: DC

## 2019-11-09 NOTE — Telephone Encounter (Signed)
Sent it  May cause nausea (she is aware)

## 2019-11-09 NOTE — Telephone Encounter (Signed)
Copy of labs mailed to pt/Linda, I also advise [name] of Dr. Marliss Coots comments off of mychart. Daughter is requesting Flagyl sent in to the [facility] on file. She said something like this happened a few years ago, and the flagyl resolved her abd pain and issues, so she is requesting pt to try it again to make sure there is nothing "underlining we are missing" that the abx might help. [name] would like to try it again and if the flagyl doesn't help then she would like to get a CT next. [name] said pt is still having nausea and abd pain and that when she eats soon after she is dealing with the GI issues [name] discussed

## 2019-11-09 NOTE — Telephone Encounter (Signed)
Thanks for letting me know  I would consider a CT to look for kidney stones or other causes of pain if it continues -so let me know if she is not improved for the rest of the day  We had also disc tx with flagyl if needed   Please mail her a copy of most recent labs Also disable mychart-I don't think they use it

## 2019-11-09 NOTE — Telephone Encounter (Signed)
Linda notified Rx sent and she has had pt eating small meals and more liquids so far that is helping some. Daughter will have her start med and update [place] if it's not helping

## 2019-11-09 NOTE — Telephone Encounter (Signed)
[name] (daughter) called to update you on pt  Last night when pt went to bed she scouted over in bed rolled over on left side pt had a bad sharp pain left abd pain it was there for 5-10 min then it went away.  She slept pretty good last night  this morning.  She has scrambled eggs and toast @ 7:30 by 7:45 she was experiencing bad nausea.  She gave pt 4mg  tablet of ondansetron @7 :45  Pt is sleeping right now.  Her bp 111/65 heart rate 72 o2 93 temp 97.2  Pt has liquid jelly and soup for dinner

## 2019-11-20 ENCOUNTER — Other Ambulatory Visit: Payer: Self-pay | Admitting: Allergy and Immunology

## 2019-11-20 DIAGNOSIS — J3089 Other allergic rhinitis: Secondary | ICD-10-CM

## 2019-12-21 ENCOUNTER — Other Ambulatory Visit: Payer: Self-pay

## 2019-12-21 ENCOUNTER — Other Ambulatory Visit: Payer: Medicare Other | Admitting: Adult Health Nurse Practitioner

## 2019-12-21 ENCOUNTER — Other Ambulatory Visit: Payer: Self-pay | Admitting: Family Medicine

## 2019-12-21 DIAGNOSIS — F0390 Unspecified dementia without behavioral disturbance: Secondary | ICD-10-CM | POA: Diagnosis not present

## 2019-12-21 DIAGNOSIS — Z515 Encounter for palliative care: Secondary | ICD-10-CM

## 2019-12-21 NOTE — Progress Notes (Signed)
[facility] Consult Note Telephone: [phone]  Fax: [phone]  PATIENT NAME: Ashlee Reyes DOB: 26-Apr-1921 MRN: [id number]  PRIMARY CARE PROVIDER:   Abner Greenspan, MD  REFERRING PROVIDER:  Abner Greenspan, MD [address] [place],  [place] [address]  RESPONSIBLE PARTY:   [name], daughter [phone]  Due to the COVID-19 crisis, this visit was done via telemedicine and it was initiated and consent by this patient and or family. Video-audio (telehealth) contact was unable to be done due to technical barriers from the patient's side.        RECOMMENDATIONS and PLAN:  1.  Advanced care planning.  Patient is a DNR.  No changes made today  2. Dementia.  Daughter has no new concerns and states that she has not had any change. Patient is able to ambulate with a walker but uses a wheelchair when having back pain.  She requires assistance with ADLs.  Is able to go to the bathroom by herself and feed herself.  Has occasional incontinence of bladder.  Is continent of bowel.  A&O to person and place.  Daughter denies any anxiety/agitation.  Patient sleeps well.  Continue supportive care.  3.  Pain. Patient gets lumbar back pain.  Daughter states that over the past 3 days she has been having increased back pain.  States that when she gets injections at L4-L5 that it helps.  It has been over a year since her last injection.  Daughter states that she is going to call and set up appointment for her mother to get an injection in her lumbar spine.  Patient had left abdominal pain in November.  She does have a history of diverticulosis/diverticulitis. Daughter does state that she has occasional abdominal pain due to her history but every now and then it gets worse. Was seen by PCP and was started on flagyl.  Daughter states that she has been much better since.    4.  Hypertension.  Daughter states that her mother's BP has been doing  well.  Today it is 137/92 and HR 58, temp 97.6.  She has parameters to give the amlodipine 2.5 mg when SBP over 145 or DBP over 90. Continue current dose of amlodipine with parameters.  Overall patient is stable.  No reports of falls or hospital visits. Daughter does state that she is not eating as much but no reported weight loss.  Have telehealth visit in 3 months.  Encouraged to call with any concerns.  Palliative will continue to monitor for symptom management and decline and make recommendations as needed.   I spent 30 minutes providing this consultation,  from 2:00 to 2:30. More than 50% of the time in this consultation was spent coordinating communication.   HISTORY OF PRESENT ILLNESS:  [name] is a 86 y.o. year old female with multiple medical problems including dementia, HTN, OA of left knee, lumbar spinal stenosis, h/o skin cancer. Palliative Care was asked to help address goals of care.   CODE STATUS: DNR  PPS: 50% HOSPICE ELIGIBILITY/DIAGNOSIS: TBD  PHYSICAL EXAM:   Deferred  PAST MEDICAL HISTORY:  Past Medical History:  Diagnosis Date  . AAA (abdominal aortic aneurysm) ([place])    followed by Dr Oneida Alar. Last u/s07/10 stable AAA with largest measurement 3.97 x 4.02cm.  . Allergic rhinitis, seasonal   . Asthma   . Atrial fibrillation ([place])   . CAD (coronary artery disease) 07/2009  mod by cath 08/10  . Complication of anesthesia   . Depression   . Disc disease, degenerative, lumbar or lumbosacral    SPINAL STENOSIS  . Diverticulosis   . GERD (gastroesophageal reflux disease)   . Hemorrhoids   . History of recurrent UTIs   . Hyperlipidemia   . IBS (irritable bowel syndrome)   . IC (irritable colon)    DMSO in past  . Interstitial cystitis   . Osteoarthritis    spine/spinal stenosis  . Osteopenia 2011  . PONV (postoperative nausea and vomiting)   . Skin cancer    basal cell cancer - face.    SOCIAL HX:  Social History   Tobacco Use  . Smoking  status: Never Smoker  . Smokeless tobacco: Never Used  Substance Use Topics  . Alcohol use: No    Alcohol/week: 0.0 standard drinks    ALLERGIES:  Allergies  Allergen Reactions  . Amoxicillin-Pot Clavulanate Nausea And Vomiting    Has patient had a PCN reaction causing immediate rash, facial/tongue/throat swelling, SOB or lightheadedness with hypotension: No Has patient had a PCN reaction causing severe rash involving mucus membranes or skin necrosis: No Has patient had a PCN reaction that required hospitalization: No Has patient had a PCN reaction occurring within the last 10 years: No If all of the above answers are "NO", then may proceed with Cephalosporin use.   . Avelox [Moxifloxacin] Nausea And Vomiting  . Flovent Hfa [Fluticasone] Other (See Comments)    "makes her feel funny" per family member  . Ibuprofen Other (See Comments)    GI upset  . Keflex [Cephalexin] Other (See Comments)    abd pain / GI upset  . Nitrofurantoin Other (See Comments)    Chest pain or indigestion with nausea  . Omeprazole Nausea Only  . Prevnar [Pneumococcal 13-Val Conj Vacc] Itching    Severe local reaction with redness and itching   . Rosuvastatin Other (See Comments)    Foot pain  . Sertraline Hcl Other (See Comments)    Made her more depressed  . Tetanus Toxoids     Local redness and swelling      PERTINENT MEDICATIONS:  Outpatient Encounter Medications as of 12/21/2019  Medication Sig  . acetaminophen (TYLENOL) 500 MG tablet Take 1 tablet (500 mg total) by mouth every 4 (four) hours as needed for mild pain.  Marland Kitchen albuterol (VENTOLIN HFA) 108 (90 Base) MCG/ACT inhaler INHALE 2 PUFFS BY MOUTH EVERY 4 HOURS AS NEEDED FOR WHEEZING  . amLODipine (NORVASC) 2.5 MG tablet Take one pill by mouth daily as needed for elevated blood pressure (over 145/90)  . beclomethasone (QVAR REDIHALER) 80 MCG/ACT inhaler Inhale 2 puffs into the lungs 2 (two) times daily.  . cetirizine (ZYRTEC) 10 MG tablet TAKE 1  TABLET BY MOUTH EVERY DAY AS NEEDED  . clopidogrel (PLAVIX) 75 MG tablet TAKE 1 TABLET(75 MG) BY MOUTH DAILY  . docusate sodium (COLACE) 100 MG capsule Take 1 capsule (100 mg total) by mouth every evening.  Marland Kitchen esomeprazole (NEXIUM) 40 MG capsule Take 1 capsule (40 mg total) by mouth 2 (two) times daily before a meal.  . famotidine (PEPCID) 40 MG tablet Take 1 tablet (40 mg total) by mouth daily.  . fluticasone (FLONASE) 50 MCG/ACT nasal spray Place 2 sprays into both nostrils daily.  Marland Kitchen LUMIGAN 0.01 % SOLN Place 1 drop into both eyes at bedtime.  . metroNIDAZOLE (FLAGYL) 500 MG tablet Take 1 tablet (500 mg total) by mouth 3 (  three) times daily.  . ondansetron (ZOFRAN-ODT) 4 MG disintegrating tablet DISSOLVE 1 TABLET(4 MG) ON THE TONGUE EVERY 8 HOURS AS NEEDED FOR NAUSEA OR VOMITING  . Polyethyl Glycol-Propyl Glycol (SYSTANE OP) Place 1 drop into both eyes every 8 (eight) hours as needed (dry eyes).   . polyethylene glycol (MIRALAX / GLYCOLAX) packet Take 17 g by mouth daily as needed for mild constipation.  . Probiotic Product (ALIGN) 4 MG CAPS Take 1 capsule (4 mg total) by mouth daily.  . traMADol (ULTRAM) 50 MG tablet TAKE 1/2 TABLET BY MOUTH EVERY MORNING AND 1 AT BEDTIME  . trimethoprim (TRIMPEX) 100 MG tablet Take 1 tablet (100 mg total) by mouth daily with lunch.   No facility-administered encounter medications on file as of 12/21/2019.       Amy Jenetta Downer, NP

## 2019-12-21 NOTE — Telephone Encounter (Signed)
Name of Medication:Tramadol 50mg  Name of Pharmacy:[facility] [address] Last Fill or Written Date and Quantity:11/02/19#45 tabs with0 refills Last Office Visit and Type:11/06/19 GI problems Next Office Visit and Type:none scheduled Last Controlled Substance Agreement Date:HOSPICE/PALLIATIVE PATIENT

## 2019-12-24 DIAGNOSIS — C4441 Basal cell carcinoma of skin of scalp and neck: Secondary | ICD-10-CM | POA: Diagnosis not present

## 2019-12-24 DIAGNOSIS — D2271 Melanocytic nevi of right lower limb, including hip: Secondary | ICD-10-CM | POA: Diagnosis not present

## 2019-12-24 DIAGNOSIS — L720 Epidermal cyst: Secondary | ICD-10-CM | POA: Diagnosis not present

## 2019-12-24 DIAGNOSIS — L821 Other seborrheic keratosis: Secondary | ICD-10-CM | POA: Diagnosis not present

## 2019-12-24 DIAGNOSIS — D485 Neoplasm of uncertain behavior of skin: Secondary | ICD-10-CM | POA: Diagnosis not present

## 2019-12-24 DIAGNOSIS — D225 Melanocytic nevi of trunk: Secondary | ICD-10-CM | POA: Diagnosis not present

## 2019-12-24 DIAGNOSIS — Z85828 Personal history of other malignant neoplasm of skin: Secondary | ICD-10-CM | POA: Diagnosis not present

## 2019-12-24 DIAGNOSIS — L918 Other hypertrophic disorders of the skin: Secondary | ICD-10-CM | POA: Diagnosis not present

## 2019-12-24 DIAGNOSIS — D1801 Hemangioma of skin and subcutaneous tissue: Secondary | ICD-10-CM | POA: Diagnosis not present

## 2020-01-21 DIAGNOSIS — C4441 Basal cell carcinoma of skin of scalp and neck: Secondary | ICD-10-CM | POA: Diagnosis not present

## 2020-01-21 DIAGNOSIS — Z85828 Personal history of other malignant neoplasm of skin: Secondary | ICD-10-CM | POA: Diagnosis not present

## 2020-01-27 ENCOUNTER — Other Ambulatory Visit: Payer: Self-pay | Admitting: *Deleted

## 2020-01-27 MED ORDER — TRAMADOL HCL 50 MG PO TABS: ORAL_TABLET | ORAL | 3 refills | Status: DC

## 2020-01-27 NOTE — Telephone Encounter (Signed)
Name of Medication:Tramadol 50mg  Name of Pharmacy:[facility] [address] Last Fill or Written Date and Quantity:12/21/19#45 tabs with0 refills Last Office Visit and Type:11/06/19 GI problems Next Office Visit and Type:none scheduled Last Controlled Substance Agreement Date:HOSPICE/PALLIATIVE PATIENT

## 2020-02-12 ENCOUNTER — Other Ambulatory Visit: Payer: Self-pay

## 2020-02-12 ENCOUNTER — Ambulatory Visit: Payer: Medicare Other | Attending: Internal Medicine

## 2020-02-12 DIAGNOSIS — Z23 Encounter for immunization: Secondary | ICD-10-CM

## 2020-02-12 NOTE — Progress Notes (Signed)
   Covid-[facility] Vaccination [facility]  Name:  Ashlee Reyes    MRN: [id number] DOB: 1921/08/22  02/12/2020  Ashlee Reyes was observed post Covid-19 immunization for 15 minutes without incidence. She was provided with Vaccine Information Sheet and instruction to access the V-Safe system.   Ashlee Reyes was instructed to call 911 with any severe reactions post vaccine: Marland Kitchen Difficulty breathing  . Swelling of your face and throat  . A fast heartbeat  . A bad rash all over your body  . Dizziness and weakness    Immunizations Administered    Name Date Dose VIS Date Route   Pfizer COVID-19 Vaccine 02/12/2020 11:47 AM 0.3 mL 11/27/2019 Intramuscular   Manufacturer: [place]   Lot: [id number]   [place]: [id number]

## 2020-03-04 DIAGNOSIS — Z961 Presence of intraocular lens: Secondary | ICD-10-CM | POA: Diagnosis not present

## 2020-03-04 DIAGNOSIS — H353132 Nonexudative age-related macular degeneration, bilateral, intermediate dry stage: Secondary | ICD-10-CM | POA: Diagnosis not present

## 2020-03-08 ENCOUNTER — Ambulatory Visit: Payer: Medicare Other | Attending: Internal Medicine

## 2020-03-08 DIAGNOSIS — Z23 Encounter for immunization: Secondary | ICD-10-CM

## 2020-03-08 NOTE — Progress Notes (Signed)
   Covid-19 Vaccination Clinic  Name:  Ashlee Reyes    MRN: [id number] DOB: 1921/11/15  03/08/2020  [name] was observed post Covid-19 immunization for 15 minutes without incident. She was provided with Vaccine Information Sheet and instruction to access the V-Safe system.   [name] was instructed to call 911 with any severe reactions post vaccine: Marland Kitchen Difficulty breathing  . Swelling of face and throat  . A fast heartbeat  . A bad rash all over body  . Dizziness and weakness   Immunizations Administered    Name Date Dose VIS Date Route   Pfizer COVID-19 Vaccine 03/08/2020  4:05 PM 0.3 mL 11/27/2019 Intramuscular   Manufacturer: [place]   Lot: [id number]   [place]: [id number]

## 2020-03-21 ENCOUNTER — Other Ambulatory Visit: Payer: Medicare Other | Admitting: Adult Health Nurse Practitioner

## 2020-03-21 ENCOUNTER — Other Ambulatory Visit: Payer: Self-pay

## 2020-03-21 DIAGNOSIS — Z515 Encounter for palliative care: Secondary | ICD-10-CM

## 2020-03-21 DIAGNOSIS — F0390 Unspecified dementia without behavioral disturbance: Secondary | ICD-10-CM

## 2020-03-21 NOTE — Progress Notes (Signed)
[facility] Consult Note Telephone: [phone]  Fax: [phone]  PATIENT NAME: Ashlee Reyes DOB: February 09, 1921 MRN: [id number]  PRIMARY CARE PROVIDER:   Abner Greenspan, MD  REFERRING PROVIDER:  Abner Greenspan, MD [address] [place],  [place] [address]  RESPONSIBLE PARTY:   [name], daughter [phone]  Due to the COVID-19 crisis, this visit was done via telemedicine and it was initiated and consent by this patient and or family. Video-audio (telehealth) contact was unable to be done due to technical barriers from the patient's side.       RECOMMENDATIONS and PLAN:  1.  Advanced care planning. Patient is a DNR  2.  Dementia.  Daughter has no new concerns and states that she has not had any changes. Patient is able to ambulate with a walker but uses a wheelchair when having back pain. She requires assistance with ADLs. Is able to go to the bathroom by herself and feed herself. Has occasional incontinence of bladder. Is continent of bowel. A&O to person and place. Daughter denies any anxiety/agitation. Patient sleeps well. Patient is eating well with no weight loss reported.  Current weight is 175.Continue supportive care at   3.  Pain.  Patient has been having less pain.  She gets relief with her muscle rubs when needed.  She has not needed any joint injections for over a year.  Daughter states that she has been walking more too.  Continue to encourage activity and as needed pain regimen  Overall patient is stable. No reports of falls, infections, or hospital visits since last visit.  Daughter wants to call when needed for palliative support since patient is doing well right now.Encouraged to call with any concerns. Palliative will continue to monitor for symptom management and decline and make recommendations as needed.  I spent 30 minutes providing this consultation,  from 2:00 to 2:30 including time  patient/family, chart review, provider coordination, documentation. More than 50% of the time in this consultation was spent coordinating communication.   HISTORY OF PRESENT ILLNESS:  Ashlee Reyes is a 86 y.o. year old female with multiple medical problems including dementia, HTN, OA of left knee, lumbar spinal stenosis, h/o skin cancer. Palliative Care was asked to help address goals of care.   CODE STATUS: DNR  PPS: 50% HOSPICE ELIGIBILITY/DIAGNOSIS: TBD  PHYSICAL EXAM:   Deferred  PAST MEDICAL HISTORY:  Past Medical History:  Diagnosis Date  . AAA (abdominal aortic aneurysm) ([facility])    followed by Dr Oneida Alar. Last u/s07/10 stable AAA with largest measurement 3.97 x 4.02cm.  . Allergic rhinitis, seasonal   . Asthma   . Atrial fibrillation ([facility])   . CAD (coronary artery disease) 07/2009   mod by cath 08/10  . Complication of anesthesia   . Depression   . Disc disease, degenerative, lumbar or lumbosacral    SPINAL STENOSIS  . Diverticulosis   . GERD (gastroesophageal reflux disease)   . Hemorrhoids   . History of recurrent UTIs   . Hyperlipidemia   . IBS (irritable bowel syndrome)   . IC (irritable colon)    DMSO in past  . Interstitial cystitis   . Osteoarthritis    spine/spinal stenosis  . Osteopenia 2011  . PONV (postoperative nausea and vomiting)   . Skin cancer    basal cell cancer - face.    SOCIAL HX:  Social History   Tobacco Use  . Smoking status:  Never Smoker  . Smokeless tobacco: Never Used  Substance Use Topics  . Alcohol use: No    Alcohol/week: 0.0 standard drinks    ALLERGIES:  Allergies  Allergen Reactions  . Amoxicillin-Pot Clavulanate Nausea And Vomiting    Has patient had a PCN reaction causing immediate rash, facial/tongue/throat swelling, SOB or lightheadedness with hypotension: No Has patient had a PCN reaction causing severe rash involving mucus membranes or skin necrosis: No Has patient had a PCN reaction that required  hospitalization: No Has patient had a PCN reaction occurring within the last 10 years: No If all of the above answers are "NO", then may proceed with Cephalosporin use.   . Avelox [Moxifloxacin] Nausea And Vomiting  . Flovent Hfa [Fluticasone] Other (See Comments)    "makes her feel funny" per family member  . Ibuprofen Other (See Comments)    GI upset  . Keflex [Cephalexin] Other (See Comments)    abd pain / GI upset  . Nitrofurantoin Other (See Comments)    Chest pain or indigestion with nausea  . Omeprazole Nausea Only  . Prevnar [Pneumococcal 13-Val Conj Vacc] Itching    Severe local reaction with redness and itching   . Rosuvastatin Other (See Comments)    Foot pain  . Sertraline Hcl Other (See Comments)    Made her more depressed  . Tetanus Toxoids     Local redness and swelling      PERTINENT MEDICATIONS:  Outpatient Encounter Medications as of 03/21/2020  Medication Sig  . acetaminophen (TYLENOL) 500 MG tablet Take 1 tablet (500 mg total) by mouth every 4 (four) hours as needed for mild pain.  Marland Kitchen albuterol (VENTOLIN HFA) 108 (90 Base) MCG/ACT inhaler INHALE 2 PUFFS BY MOUTH EVERY 4 HOURS AS NEEDED FOR WHEEZING  . amLODipine (NORVASC) 2.5 MG tablet Take one pill by mouth daily as needed for elevated blood pressure (over 145/90)  . beclomethasone (QVAR REDIHALER) 80 MCG/ACT inhaler Inhale 2 puffs into the lungs 2 (two) times daily.  . cetirizine (ZYRTEC) 10 MG tablet TAKE 1 TABLET BY MOUTH EVERY DAY AS NEEDED  . clopidogrel (PLAVIX) 75 MG tablet TAKE 1 TABLET(75 MG) BY MOUTH DAILY  . docusate sodium (COLACE) 100 MG capsule Take 1 capsule (100 mg total) by mouth every evening.  Marland Kitchen esomeprazole (NEXIUM) 40 MG capsule Take 1 capsule (40 mg total) by mouth 2 (two) times daily before a meal.  . famotidine (PEPCID) 40 MG tablet Take 1 tablet (40 mg total) by mouth daily.  . fluticasone (FLONASE) 50 MCG/ACT nasal spray Place 2 sprays into both nostrils daily.  Marland Kitchen LUMIGAN 0.01 % SOLN  Place 1 drop into both eyes at bedtime.  . metroNIDAZOLE (FLAGYL) 500 MG tablet Take 1 tablet (500 mg total) by mouth 3 (three) times daily.  . ondansetron (ZOFRAN-ODT) 4 MG disintegrating tablet DISSOLVE 1 TABLET(4 MG) ON THE TONGUE EVERY 8 HOURS AS NEEDED FOR NAUSEA OR VOMITING  . Polyethyl Glycol-Propyl Glycol (SYSTANE OP) Place 1 drop into both eyes every 8 (eight) hours as needed (dry eyes).   . polyethylene glycol (MIRALAX / GLYCOLAX) packet Take 17 g by mouth daily as needed for mild constipation.  . Probiotic Product (ALIGN) 4 MG CAPS Take 1 capsule (4 mg total) by mouth daily.  . traMADol (ULTRAM) 50 MG tablet TAKE 1/2 TABLET BY MOUTH EVERY MORNING AND TAKE 1 TABLET AT AT BEDTIME  . trimethoprim (TRIMPEX) 100 MG tablet Take 1 tablet (100 mg total) by mouth daily with lunch.  No facility-administered encounter medications on file as of 03/21/2020.      Devontaye Ground Jenetta Downer, NP

## 2020-04-04 ENCOUNTER — Other Ambulatory Visit: Payer: Self-pay | Admitting: Allergy and Immunology

## 2020-04-04 DIAGNOSIS — J3089 Other allergic rhinitis: Secondary | ICD-10-CM

## 2020-04-05 ENCOUNTER — Ambulatory Visit (INDEPENDENT_AMBULATORY_CARE_PROVIDER_SITE_OTHER): Payer: Medicare Other | Admitting: Family Medicine

## 2020-04-05 ENCOUNTER — Encounter: Payer: Self-pay | Admitting: Family Medicine

## 2020-04-05 ENCOUNTER — Other Ambulatory Visit: Payer: Self-pay

## 2020-04-05 VITALS — BP 151/70 | HR 78 | Temp 98.2°F

## 2020-04-05 DIAGNOSIS — I160 Hypertensive urgency: Secondary | ICD-10-CM | POA: Diagnosis not present

## 2020-04-05 DIAGNOSIS — I714 Abdominal aortic aneurysm, without rupture, unspecified: Secondary | ICD-10-CM

## 2020-04-05 DIAGNOSIS — N390 Urinary tract infection, site not specified: Secondary | ICD-10-CM

## 2020-04-05 DIAGNOSIS — Z515 Encounter for palliative care: Secondary | ICD-10-CM

## 2020-04-05 DIAGNOSIS — R410 Disorientation, unspecified: Secondary | ICD-10-CM | POA: Diagnosis not present

## 2020-04-05 LAB — POCT UA - MICROSCOPIC ONLY
Casts, Ur, LPF, POC: 0
Crystals, Ur, HPF, POC: 0
RBC, urine, microscopic: 0
WBC, Ur, HPF, POC: 0
Yeast, UA: 0

## 2020-04-05 LAB — POC URINALSYSI DIPSTICK (AUTOMATED)
Bilirubin, UA: NEGATIVE
Blood, UA: 25
Glucose, UA: NEGATIVE
Ketones, UA: NEGATIVE
Leukocytes, UA: NEGATIVE
Nitrite, UA: NEGATIVE
Protein, UA: NEGATIVE
Spec Grav, UA: <=1.005 — AB (ref 1.010–1.025)
Urobilinogen, UA: 0.2 E.U./dL
pH, UA: 6 (ref 5.0–8.0)

## 2020-04-05 NOTE — Assessment & Plan Note (Signed)
Labile bp -was up high at home today  Coming down with amlodipine 2.5 mg  BP: (!) 151/70    If this spikes again will re dose Continue to give prn as pt gets easily hypotensive

## 2020-04-05 NOTE — Patient Instructions (Addendum)
Watch blood pressure  If it spikes again tonight -give another one  Keep [place] posted   Encourage fluids  If urinary symptoms worsen -let us know  I sent a culture because the ua was pretty bland  If culture is positive we will treat it

## 2020-04-05 NOTE — Assessment & Plan Note (Signed)
No clinical changes  Labile bp -controlling as well as we can

## 2020-04-05 NOTE — Assessment & Plan Note (Addendum)
Palliative care continues to follow  More generalized weakness and fatigue with advancing age

## 2020-04-05 NOTE — Assessment & Plan Note (Addendum)
Pt had an off day yesterday with worse confusion/low appetite and pallor  C/o bladder pain and dysuria baseline-hard to tell when she has uti in light of IC ua is fairly bland (small blood) but also very dilute  Will send for cx and tx if positive Continues trimethoprim for prophylaxis  inst to update if symptoms worsen

## 2020-04-05 NOTE — Progress Notes (Signed)
Subjective:    Patient ID: Ashlee Reyes, female    DOB: 07/15/1921, 86 y.o.   MRN: [id number]  This visit occurred during the SARS-CoV-2 public health emergency.  Safety protocols were in place, including screening questions prior to the visit, additional usage of staff PPE, and extensive cleaning of exam room while observing appropriate contact time as indicated for disinfecting solutions.    HPI Pt presents with possible uti / mental status change  Also elevated bp   She complains of some burning to urinate (not uncommon) Also feels constipated-low abdomen feels full  No fever or chills   Takes trimethoprim for suppression -it has helped greatly   Family notes more confusion than usual   Sleeping more  More weak in general    Decreased appetite-did not eat her dinner last night  She was more pale yesterday  Felt better today and looks better also    She is 86 yo and a palliative care pt   She has a h/o labile bp with hypertensive urgency and also hypotension in the past   At home today bp was 184/125 (sitting) Family gave her a dose of 2.5 mg amlodipine (which she takes prn for bp over 145/90)   BP Readings from Last 3 Encounters:  04/05/20 (!) 168/92  11/06/19 138/66  09/28/19 132/70  is starting to come down now   Pulse Readings from Last 3 Encounters:  04/05/20 78  11/06/19 81  09/28/19 75   No headache   H/o both IC and frequent utis  Takes daily trimethoprim for prophylaxis   Last urine cx in epic was clear- 11/20   Patient Active Problem List   Diagnosis Date Noted  . Left lateral abdominal pain 11/06/2019  . Labile blood pressure 07/31/2019  . Insect bite of right lower leg 06/03/2019  . Allergic rhinitis 02/20/2019  . Palliative care status 01/12/2019  . Hypertensive urgency 12/24/2018  . Compression fracture of L1 lumbar vertebra ([facility]) 09/26/2018  . Mobility impaired 09/26/2018  . History of endovascular stent graft for abdominal  aortic aneurysm (AAA) 01/01/2018  . Unilateral primary osteoarthritis, left knee 07/11/2017  . Hyperlipidemia 03/12/2017  . Frequent UTI/interstitial cystitis 02/06/2016  . Hypotension 01/09/2016  . Carotid stenosis 06/01/2015  . Local reaction to immunization 02/21/2015  . Encounter for Medicare annual wellness exam 02/15/2015  . Estrogen deficiency 02/15/2015  . Sinusitis, chronic 01/12/2015  . IBS (irritable bowel syndrome) 01/12/2015  . Orthostatic hypotension 01/11/2015  . Palpitations 05/13/2014  . Second degree heart block 10/18/2013  . Syncope 10/17/2013  . Aneurysm of abdominal vessel ([facility]) 06/25/2013  . Osteopenia 01/13/2013  . CAD (coronary artery disease)   . Chronic depression 06/03/2007  . GERD 06/02/2007  . Asthma   . Chronic interstitial cystitis   . Osteoarthritis   . Spinal stenosis of lumbar region   . History of urinary tract infection    Past Medical History:  Diagnosis Date  . AAA (abdominal aortic aneurysm) ([facility])    followed by Dr Oneida Alar. Last u/s07/10 stable AAA with largest measurement 3.97 x 4.02cm.  . Allergic rhinitis, seasonal   . Asthma   . Atrial fibrillation ([facility])   . CAD (coronary artery disease) 07/2009   mod by cath 08/10  . Complication of anesthesia   . Depression   . Disc disease, degenerative, lumbar or lumbosacral    SPINAL STENOSIS  . Diverticulosis   . GERD (gastroesophageal reflux disease)   . Hemorrhoids   .  History of recurrent UTIs   . Hyperlipidemia   . IBS (irritable bowel syndrome)   . IC (irritable colon)    DMSO in past  . Interstitial cystitis   . Osteoarthritis    spine/spinal stenosis  . Osteopenia 2011  . PONV (postoperative nausea and vomiting)   . Skin cancer    basal cell cancer - face.   Past Surgical History:  Procedure Laterality Date  . ABDOMINAL AORTIC ENDOVASCULAR STENT GRAFT N/A 07/01/2013   Procedure: ABDOMINAL AORTIC ENDOVASCULAR STENT GRAFT- GORE;  Surgeon: Elam Dutch, MD;  Location: [facility];  Service: Vascular;  Laterality: N/A;  Ultrasound guided  . ABDOMINAL HYSTERECTOMY     partial not cancer  . BREAST SURGERY     lumpectomy  . CYSTOCELE REPAIR    . ESOPHAGOGASTRODUODENOSCOPY  09/1998   erosive esphagitis ? stricture treated 12/2002  . HERNIA REPAIR  [id number]   umbilical hernia  . RECTOCELE REPAIR    . REFRACTIVE SURGERY     for film after cataract surgery.  Marland Kitchen RIGHT COLECTOMY  1997   benign  . SKIN LESION EXCISION  05/2011   basal cell skin lesion removed.   Social History   Tobacco Use  . Smoking status: Never Smoker  . Smokeless tobacco: Never Used  Substance Use Topics  . Alcohol use: No    Alcohol/week: 0.0 standard drinks  . Drug use: No   Family History  Problem Relation Age of Onset  . Heart disease Mother        deceased age 50 heart attack.  Marland Kitchen Heart attack Mother   . Other Mother        varicose veins  . Varicose Veins Mother   . Heart disease Father        age 77 heart attack  . Cancer Father   . Stroke Brother   . Cancer Brother   . Heart disease Brother   . Heart attack Brother   . Cancer Daughter   . Diabetes Son    Allergies  Allergen Reactions  . Amoxicillin-Pot Clavulanate Nausea And Vomiting    Has patient had a PCN reaction causing immediate rash, facial/tongue/throat swelling, SOB or lightheadedness with hypotension: No Has patient had a PCN reaction causing severe rash involving mucus membranes or skin necrosis: No Has patient had a PCN reaction that required hospitalization: No Has patient had a PCN reaction occurring within the last 10 years: No If all of the above answers are "NO", then may proceed with Cephalosporin use.   . Avelox [Moxifloxacin] Nausea And Vomiting  . Flovent Hfa [Fluticasone] Other (See Comments)    "makes her feel funny" per family member  . Ibuprofen Other (See Comments)    GI upset  . Keflex [Cephalexin] Other (See Comments)    abd pain / GI upset  . Nitrofurantoin Other (See Comments)     Chest pain or indigestion with nausea  . Omeprazole Nausea Only  . Prevnar [Pneumococcal 13-Val Conj Vacc] Itching    Severe local reaction with redness and itching   . Rosuvastatin Other (See Comments)    Foot pain  . Sertraline Hcl Other (See Comments)    Made her more depressed  . Tetanus Toxoids     Local redness and swelling    Current Outpatient Medications on File Prior to Visit  Medication Sig Dispense Refill  . acetaminophen (TYLENOL) 500 MG tablet Take 1 tablet (500 mg total) by mouth every 4 (four) hours as needed  for mild pain. 30 tablet 0  . albuterol (VENTOLIN HFA) 108 (90 Base) MCG/ACT inhaler INHALE 2 PUFFS BY MOUTH EVERY 4 HOURS AS NEEDED FOR WHEEZING 18 g 1  . amLODipine (NORVASC) 2.5 MG tablet Take one pill by mouth daily as needed for elevated blood pressure (over 145/90) 30 tablet 11  . beclomethasone (QVAR REDIHALER) 80 MCG/ACT inhaler Inhale 2 puffs into the lungs 2 (two) times daily. 1 Inhaler 0  . cetirizine (ZYRTEC) 10 MG tablet TAKE 1 TABLET BY MOUTH EVERY DAY AS NEEDED 30 tablet 0  . clopidogrel (PLAVIX) 75 MG tablet TAKE 1 TABLET(75 MG) BY MOUTH DAILY 30 tablet 11  . docusate sodium (COLACE) 100 MG capsule Take 1 capsule (100 mg total) by mouth every evening. 10 capsule 0  . esomeprazole (NEXIUM) 40 MG capsule Take 1 capsule (40 mg total) by mouth 2 (two) times daily before a meal. 60 capsule 11  . famotidine (PEPCID) 40 MG tablet Take 1 tablet (40 mg total) by mouth daily. 30 tablet 5  . fluticasone (FLONASE) 50 MCG/ACT nasal spray Place 2 sprays into both nostrils daily. 16 g 11  . LUMIGAN 0.01 % SOLN Place 1 drop into both eyes at bedtime.    . ondansetron (ZOFRAN-ODT) 4 MG disintegrating tablet DISSOLVE 1 TABLET(4 MG) ON THE TONGUE EVERY 8 HOURS AS NEEDED FOR NAUSEA OR VOMITING 20 tablet 3  . Polyethyl Glycol-Propyl Glycol (SYSTANE OP) Place 1 drop into both eyes every 8 (eight) hours as needed (dry eyes).     . polyethylene glycol (MIRALAX / GLYCOLAX)  packet Take 17 g by mouth daily as needed for mild constipation. 14 each 0  . Probiotic Product (ALIGN) 4 MG CAPS Take 1 capsule (4 mg total) by mouth daily.    . traMADol (ULTRAM) 50 MG tablet TAKE 1/2 TABLET BY MOUTH EVERY MORNING AND TAKE 1 TABLET AT AT BEDTIME 45 tablet 3  . trimethoprim (TRIMPEX) 100 MG tablet Take 1 tablet (100 mg total) by mouth daily with lunch. 30 tablet 0   No current facility-administered medications on file prior to visit.     Review of Systems  Constitutional: Positive for activity change, appetite change and fatigue. Negative for fever and unexpected weight change.       More generalized weakness  HENT: Negative for congestion, ear pain, rhinorrhea, sinus pressure and sore throat.   Eyes: Negative for pain, redness and visual disturbance.  Respiratory: Negative for cough, shortness of breath and wheezing.   Cardiovascular: Negative for chest pain and palpitations.  Gastrointestinal: Negative for abdominal pain, blood in stool, constipation and diarrhea.  Endocrine: Negative for polydipsia and polyuria.  Genitourinary: Positive for dysuria, frequency and urgency. Negative for decreased urine volume.  Musculoskeletal: Negative for arthralgias, back pain and myalgias.  Skin: Negative for pallor and rash.  Allergic/Immunologic: Negative for environmental allergies.  Neurological: Negative for dizziness, syncope and headaches.  Hematological: Negative for adenopathy. Does not bruise/bleed easily.  Psychiatric/Behavioral: Positive for confusion. Negative for agitation, decreased concentration and dysphoric mood. The patient is not nervous/anxious.        Objective:   Physical Exam Constitutional:      Appearance: Normal appearance. She is not ill-appearing.     Comments: Frail appearing   HENT:     Head: Normocephalic and atraumatic.     Mouth/Throat:     Mouth: Mucous membranes are moist.  Eyes:     General: No scleral icterus.    Conjunctiva/sclera:  Conjunctivae normal.  Pupils: Pupils are equal, round, and reactive to light.  Neck:     Vascular: Carotid bruit present.  Cardiovascular:     Rate and Rhythm: Normal rate and regular rhythm.     Pulses: Normal pulses.  Pulmonary:     Effort: Pulmonary effort is normal. No respiratory distress.     Breath sounds: Normal breath sounds. No wheezing or rales.  Abdominal:     General: Abdomen is flat. Bowel sounds are normal. There is no distension.     Palpations: Abdomen is soft. There is no mass.     Tenderness: There is no right CVA tenderness or left CVA tenderness.     Comments: Mild suprapubic tenderness w/o rebound or guarding  Musculoskeletal:     Cervical back: Neck supple.     Right lower leg: No edema.     Left lower leg: No edema.  Lymphadenopathy:     Cervical: No cervical adenopathy.  Skin:    Coloration: Skin is not pale.     Findings: No erythema or rash.  Neurological:     Mental Status: She is alert.     Comments: In wheelchair  Generally weak/not focal  Psychiatric:        Mood and Affect: Mood normal.        Behavior: Behavior normal.     Comments: Baseline dementia  Answers questions appropriately - and does repeat herself            Assessment & Plan:   Problem List Items Addressed This Visit      Cardiovascular and Mediastinum   Hypertensive urgency    Labile bp -was up high at home today  Coming down with amlodipine 2.5 mg  BP: (!) 151/70    If this spikes again will re dose Continue to give prn as pt gets easily hypotensive        Genitourinary   Frequent UTI/interstitial cystitis - Primary    Pt had an off day yesterday with worse confusion/low appetite and pallor  C/o bladder pain and dysuria baseline-hard to tell when she has uti in light of IC ua is fairly bland (small blood) but also very dilute  Will send for cx and tx if positive Continues trimethoprim for prophylaxis  inst to update if symptoms worsen       Relevant  Orders   Urine Culture   POCT UA - Microscopic Only (Completed)     Other   Palliative care status    Palliative care continues to follow  More generalized weakness and fatigue with advancing age        Other Visit Diagnoses    Confusion       Relevant Orders   POCT Urinalysis Dipstick (Automated) (Completed)

## 2020-04-06 ENCOUNTER — Telehealth: Payer: Self-pay

## 2020-04-06 LAB — URINE CULTURE
MICRO NUMBER:: 10384412
SPECIMEN QUALITY:: ADEQUATE

## 2020-04-06 NOTE — Telephone Encounter (Signed)
Thanks for letting me know- that plan sounds good

## 2020-04-06 NOTE — Telephone Encounter (Signed)
Thanks for letting me know -continue to watch her BP / let her rest and keep hydrated  Exam was re assuring yesterday but we still need to watch  Glad she did eat  Pending urine culture  Watch for fever  or headache or other symptoms and let us know if anything new develops

## 2020-04-06 NOTE — Telephone Encounter (Signed)
Left VM requesting [name] to call the office back

## 2020-04-06 NOTE — Telephone Encounter (Signed)
Linda notified of Dr. Marliss Coots comments. And recommendations. [name] said pt is sleeping a little more then normal but she did recheck her BP and it had went back up to 220/167, [name] then gave her another amlodipine today around 1:30 and last time she checked it her BP was 127/70. [name] said for now she will keep an eye on and and start just giving her the amlodipine daily instead of PRN. [name] will keep an eye on BP and if worsens she'll call back and if not she will wait for her urine cx to see if a UTI could be affecting her. [name] said when we call back with urine cx results tomorrow or Friday she will let us know what pt's BP is doing then

## 2020-04-06 NOTE — Telephone Encounter (Signed)
[place] Night - Client TELEPHONE ADVICE RECORD [facility] Patient Name: JASHAWNA MCGINITY Gender: Female DOB: 1921-08-19 Age: 86 Y 68 M 64 D Return Phone Number: [phone] (Primary), [phone] (Secondary) Address: [place] [address] Client [facility] Night - Client Client Site [place] Physician Tower, Roque Lias - MD Contact Type Call Who Is Calling Patient / Member / Family / Caregiver Call Type Triage / Clinical Caller Name [name] Relationship To Patient Daughter Return Phone Number [phone] (Primary) Chief Complaint BLOOD PRESSURE HIGH - Diastolic (bottom number) [phone] or greater (with symptoms) Reason for Call Symptomatic / Request for [place] states her mom was seen today for BP and it is going up again. She was given Amlodipine at 3:00. At 5 pm it was 156/92. Then at 7:05 222/165 pulse, at 7:15 it went back down 157/85 pulse 75. Currently BP is 163/120 pulse 76. Translation No Nurse Assessment Nurse: Antionette Fairy, RN, [place] Date/Time (Eastern Time): 04/05/2020 7:27:16 PM Confirm and document reason for call. If symptomatic, describe symptoms. ---Caller states her mom was seen today for BP and it is going up again. She was given Amlodipine at 3:00. At 5 pm it was 156/92. Then at 7:05 222/165 pulse, at 7:15 it went back down 157/85 pulse 75. Currently BP is 163/120 pulse 76. States she feels fine. She's sitting watching TV right now and says she feels fine. Yesterday she had nausea and was pale. Had a bad day yesterday. No dizziness, no Chest pain. BP now 151/102 HR 79 Has the patient had close contact with a person known or suspected to have the novel coronavirus illness OR traveled / lives in area with major community spread (including international travel) in the last 14 days from the onset of symptoms? * If Asymptomatic, screen for  exposure and travel within the last 14 days. ---No Does the patient have any new or worsening symptoms? ---Yes Will a triage be completed? ---Yes Related visit to physician within the last 2 weeks? ---Yes Does the PT have any chronic conditions? (i.e. diabetes, asthma, this includes High risk factors for pregnancy, etc.) ---Yes List chronic conditions. ---HTN Acid Reflux Is this a behavioral health or substance abuse call? ---No PLEASE NOTE: All timestamps contained within this report are represented as [place] Standard Time. CONFIDENTIALTY NOTICE: This fax transmission is intended only for the addressee. It contains information that is legally privileged, confidential or otherwise protected from use or disclosure. If you are not the intended recipient, you are strictly prohibited from reviewing, disclosing, copying using or disseminating any of this information or taking any action in reliance on or regarding this information. If you have received this fax in error, please notify us immediately by telephone so that we can arrange for its return to [place]. Phone: [phone], Toll-Free: [phone], Fax: [phone] Page: 2 of 2 Call Id: [id number] Guidelines Guideline Title Affirmed Question Affirmed Notes Nurse Date/Time Eilene Ghazi Time) Blood Pressure - High Systolic BP >= 0000000 OR Diastolic >= 123XX123 Winters, RN, Romelle Starcher 04/05/2020 7:31:52 PM Disp. Time Eilene Ghazi Time) Disposition Final User 04/05/2020 7:25:05 PM Send to Urgent Queue Kathlynn Grate 04/05/2020 7:38:07 PM SEE PCP WITHIN 3 DAYS Yes Antionette Fairy, RN, Romelle Starcher Caller Disagree/Comply Comply Caller Understands Yes PreDisposition Call Doctor Care Advice Given Per Guideline SEE PCP WITHIN 3 DAYS: * You need to be seen within 2 or 3 days. Call your doctor (or NP/PA) during regular office hours and make an  appointment. A clinic or urgent care center are good places to go for care if your doctor's office is closed or you can't get an appointment.  NOTE: If office will be open tomorrow, tell caller to call then, not in 3 days. CALL BACK IF: * Weakness or numbness of the face, arm or leg on one side of the body occurs * Difficulty walking, difficulty talking, or severe headache occurs * Chest pain or difficulty breathing occurs * Your blood pressure is over 180/110 * You become worse. CARE ADVICE given per High Blood Pressure (Adult) guideline.

## 2020-04-06 NOTE — Telephone Encounter (Signed)
Linda left v/m returning call and request cb.

## 2020-04-06 NOTE — Telephone Encounter (Signed)
I spoke with [name]  This morning pt's BP at 6:55 BP 151/83 P 70 and at 7:20 AM BP 104/57 P 66 and 8:35 AM BP 142/75 P 73. Pt is feeling tired and sleepy and weak; pt ate breakfast and pt is resting now. No H/A,CP,dizziness or SOB. Now pulse ox 95% at room air; [name] is not sure what is going on and request cb but [name] does not think pt needs to schedule another appt now since just seen on 04/05/20.

## 2020-04-07 NOTE — Telephone Encounter (Signed)
[name] will recheck it around 1pm and call us back. She said pt is feeling fine she has no nausea, no dizziness, no pain, no HA, no complaints at all. [name] is concerned because this morning around 9am bp was 107/61 p 80, but after that it just kept going up, she rechecked it around 10 pt and it was 129/82, and then it went even higher to the reading listed in prev message. Daughter just not sure what to do, it seems like the med works for a while then it "wears of and her readings go very high and [name] is concerned.  [name] will call around 1pm with next BP reading

## 2020-04-07 NOTE — Telephone Encounter (Signed)
Re check in an hour- re dose is still high or higher (her bp is so labile that I don't want to overdo it) .  How's she feeling?

## 2020-04-07 NOTE — Telephone Encounter (Signed)
Llinda left v/m at 1 PM BP 137/82 P 75. Linda request cb with what needs to be done.

## 2020-04-07 NOTE — Telephone Encounter (Addendum)
Linda l/m in regards to B/p on the patient, update. This morning it was 209/167 at 9:40 am so patient took Amlodipine 2.5 mg. Now b/p is 160/113. [name] would like to know if she should go ahead and give patient another Amlodipine or come back and see Dr Glori Bickers for follow up on this? Asked for directions. CB to [name] is [phone]

## 2020-04-07 NOTE — Telephone Encounter (Signed)
Her bp has been very labile for a long time and no one has been able to figure out why.  The low readings are more risky than the high ones which is why we have allowed it to stay over average.  Her age is also playing a role I'm sure.  Our goals are different now (comfort is more important).  Amlodipine is a once daily pill and 2.5 is the very smallest dose- so it is safer to re dose than give a larger dose once a day since her bp gets so low so quickly

## 2020-04-07 NOTE — Telephone Encounter (Signed)
Daughter notified of Dr. Marliss Coots comments and instructions and verbalized understanding.

## 2020-05-04 ENCOUNTER — Other Ambulatory Visit: Payer: Self-pay | Admitting: Allergy and Immunology

## 2020-05-04 ENCOUNTER — Other Ambulatory Visit: Payer: Self-pay | Admitting: Family Medicine

## 2020-05-04 DIAGNOSIS — J3089 Other allergic rhinitis: Secondary | ICD-10-CM

## 2020-05-10 ENCOUNTER — Telehealth: Payer: Self-pay | Admitting: Allergy and Immunology

## 2020-05-10 DIAGNOSIS — J3089 Other allergic rhinitis: Secondary | ICD-10-CM

## 2020-05-10 MED ORDER — CETIRIZINE HCL 10 MG PO TABS: 10.0000 mg | ORAL_TABLET | Freq: Every day | ORAL | 5 refills | Status: DC | PRN

## 2020-05-10 NOTE — Telephone Encounter (Signed)
Prescription has been sent in. Called and informed patient's daughter. Patient's daughter verbalized understanding and will be bringing her mom in for her appointment in June.

## 2020-05-10 NOTE — Telephone Encounter (Signed)
Patient daughter called and made appointment for 05/24/2020/pm and needs to have Zyrtec called into [facility] in [place] [phone].

## 2020-05-24 ENCOUNTER — Encounter: Payer: Self-pay | Admitting: Allergy and Immunology

## 2020-05-24 ENCOUNTER — Other Ambulatory Visit: Payer: Self-pay

## 2020-05-24 ENCOUNTER — Ambulatory Visit (INDEPENDENT_AMBULATORY_CARE_PROVIDER_SITE_OTHER): Payer: Medicare Other | Admitting: Allergy and Immunology

## 2020-05-24 VITALS — BP 124/72 | HR 60 | Temp 98.2°F | Resp 16

## 2020-05-24 DIAGNOSIS — Z8709 Personal history of other diseases of the respiratory system: Secondary | ICD-10-CM | POA: Diagnosis not present

## 2020-05-24 DIAGNOSIS — J3089 Other allergic rhinitis: Secondary | ICD-10-CM | POA: Diagnosis not present

## 2020-05-24 DIAGNOSIS — K219 Gastro-esophageal reflux disease without esophagitis: Secondary | ICD-10-CM | POA: Diagnosis not present

## 2020-05-24 MED ORDER — ALBUTEROL SULFATE HFA 108 (90 BASE) MCG/ACT IN AERS: INHALATION_SPRAY | RESPIRATORY_TRACT | 1 refills | Status: DC

## 2020-05-24 NOTE — Progress Notes (Signed)
[facility] [place]   Follow-up Note  Referring Provider: Tower, Wynelle Fanny, MD Primary Provider: Abner Greenspan, MD Date of Office Visit: 05/24/2020  Subjective:   Ashlee Reyes (DOB: 05/17/1921) is a 86 y.o. female who returns to the [facility] on 05/24/2020 in re-evaluation of the following:  HPI: Ashlee Reyes returns to this clinic in reevaluation of rhinitis and reflux induced respiratory disease and a history of asthma.  Her last interaction with this clinic was via a E-med visit on 30 Apr 2019.  She continues to do well with her nose while rarely using any topical nasal steroid and continuing to use an antihistamine on most days.  She does very well with her asthma and basically this has completely abated and rarely uses any albuterol at this point in time.  Her reflux and her throat are doing very well on her current therapy of a proton pump inhibitor plus an H2 receptor blocker.  She has received 2 Pfizer Covid vaccinations.  Allergies as of 05/24/2020      Reactions   Amoxicillin-pot Clavulanate Nausea And Vomiting   Has patient had a PCN reaction causing immediate rash, facial/tongue/throat swelling, SOB or lightheadedness with hypotension: No Has patient had a PCN reaction causing severe rash involving mucus membranes or skin necrosis: No Has patient had a PCN reaction that required hospitalization: No Has patient had a PCN reaction occurring within the last 10 years: No If all of the above answers are "NO", then may proceed with Cephalosporin use.   Avelox [moxifloxacin] Nausea And Vomiting   Flovent Hfa [fluticasone] Other (See Comments)   "makes her feel funny" per family member   Ibuprofen Other (See Comments)   GI upset   Keflex [cephalexin] Other (See Comments)   abd pain / GI upset   Nitrofurantoin Other (See Comments)   Chest pain or indigestion with nausea   Omeprazole Nausea Only   Prevnar  [pneumococcal 13-val Conj Vacc] Itching   Severe local reaction with redness and itching    Rosuvastatin Other (See Comments)   Foot pain   Sertraline Hcl Other (See Comments)   Made her more depressed   Tetanus Toxoids    Local redness and swelling       Medication List      acetaminophen 500 MG tablet Commonly known as: TYLENOL Take 1 tablet (500 mg total) by mouth every 4 (four) hours as needed for mild pain.   albuterol 108 (90 Base) MCG/ACT inhaler Commonly known as: VENTOLIN HFA INHALE 2 PUFFS BY MOUTH EVERY 4 HOURS AS NEEDED FOR WHEEZING   Align 4 MG Caps Take 1 capsule (4 mg total) by mouth daily.   amLODipine 2.5 MG tablet Commonly known as: NORVASC Take one pill by mouth daily as needed for elevated blood pressure (over 145/90)   beclomethasone 80 MCG/ACT inhaler Commonly known as: Qvar RediHaler Inhale 2 puffs into the lungs 2 (two) times daily.   cetirizine 10 MG tablet Commonly known as: ZYRTEC Take 1 tablet (10 mg total) by mouth daily as needed.   clopidogrel 75 MG tablet Commonly known as: PLAVIX TAKE 1 TABLET(75 MG) BY MOUTH DAILY   docusate sodium 100 MG capsule Commonly known as: COLACE Take 1 capsule (100 mg total) by mouth every evening.   esomeprazole 40 MG capsule Commonly known as: NEXIUM TAKE 1 CAPSULE(40 MG) BY MOUTH TWICE DAILY BEFORE A MEAL   famotidine 40 MG tablet  Commonly known as: PEPCID Take 1 tablet (40 mg total) by mouth daily.   fluticasone 50 MCG/ACT nasal spray Commonly known as: Flonase Place 2 sprays into both nostrils daily.   Lumigan 0.01 % Soln Generic drug: bimatoprost Place 1 drop into both eyes at bedtime.   ondansetron 4 MG disintegrating tablet Commonly known as: ZOFRAN-ODT DISSOLVE 1 TABLET(4 MG) ON THE TONGUE EVERY 8 HOURS AS NEEDED FOR NAUSEA OR VOMITING   polyethylene glycol 17 g packet Commonly known as: MIRALAX / GLYCOLAX Take 17 g by mouth daily as needed for mild constipation.   SYSTANE  OP Place 1 drop into both eyes every 8 (eight) hours as needed (dry eyes).   traMADol 50 MG tablet Commonly known as: ULTRAM TAKE 1/2 TABLET BY MOUTH EVERY MORNING AND TAKE 1 TABLET AT AT BEDTIME   trimethoprim 100 MG tablet Commonly known as: TRIMPEX Take 1 tablet (100 mg total) by mouth daily with lunch.       Past Medical History:  Diagnosis Date   AAA (abdominal aortic aneurysm) ([facility])    followed by Dr Oneida Alar. Last u/s07/10 stable AAA with largest measurement 3.97 x 4.02cm.   Allergic rhinitis, seasonal    Asthma    Atrial fibrillation (HCC)    CAD (coronary artery disease) 07/2009   mod by cath 76/28   Complication of anesthesia    Depression    Disc disease, degenerative, lumbar or lumbosacral    SPINAL STENOSIS   Diverticulosis    GERD (gastroesophageal reflux disease)    Hemorrhoids    History of recurrent UTIs    Hyperlipidemia    IBS (irritable bowel syndrome)    IC (irritable colon)    DMSO in past   Interstitial cystitis    Osteoarthritis    spine/spinal stenosis   Osteopenia 2011   PONV (postoperative nausea and vomiting)    Skin cancer    basal cell cancer - face.    Past Surgical History:  Procedure Laterality Date   ABDOMINAL AORTIC ENDOVASCULAR STENT GRAFT N/A 07/01/2013   Procedure: ABDOMINAL AORTIC ENDOVASCULAR STENT GRAFT- GORE;  Surgeon: Elam Dutch, MD;  Location: [facility];  Service: Vascular;  Laterality: N/A;  Ultrasound guided   ABDOMINAL HYSTERECTOMY     partial not cancer   BREAST SURGERY     lumpectomy   CYSTOCELE REPAIR     ESOPHAGOGASTRODUODENOSCOPY  09/1998   erosive esphagitis ? stricture treated 12/2002   HERNIA REPAIR  31/5176   umbilical hernia   [place]     for film after cataract surgery.   RIGHT COLECTOMY  1997   benign   SKIN LESION EXCISION  05/2011   basal cell skin lesion removed.    Review of systems negative except as noted in HPI / PMHx or noted  below:  Review of Systems  Constitutional: Negative.   HENT: Negative.   Eyes: Negative.   Respiratory: Negative.   Cardiovascular: Negative.   Gastrointestinal: Negative.   Genitourinary: Negative.   Musculoskeletal: Negative.   Skin: Negative.   Neurological: Negative.   Endo/Heme/Allergies: Negative.   Psychiatric/Behavioral: Negative.      Objective:   Vitals:   05/24/20 1109  BP: 124/72  Pulse: 60  Resp: 16  Temp: 98.2 F (36.8 C)  SpO2: 94%          Physical Exam Constitutional:      Appearance: She is not diaphoretic.  HENT:     Head: Normocephalic.  Right Ear: Ear canal and external ear normal.     Left Ear: Ear canal and external ear normal.     Nose: Nose normal. No mucosal edema or rhinorrhea.     Mouth/Throat:     Pharynx: Uvula midline. No oropharyngeal exudate.  Eyes:     Conjunctiva/sclera: Conjunctivae normal.  Neck:     Thyroid: No thyromegaly.     Trachea: Trachea normal. No tracheal tenderness or tracheal deviation.  Cardiovascular:     Rate and Rhythm: Normal rate and regular rhythm.     Heart sounds: Normal heart sounds, S1 normal and S2 normal. No murmur.  Pulmonary:     Effort: No respiratory distress.     Breath sounds: Normal breath sounds. No stridor. No wheezing or rales.  Lymphadenopathy:     Head:     Right side of head: No tonsillar adenopathy.     Left side of head: No tonsillar adenopathy.     Cervical: No cervical adenopathy.  Skin:    Findings: No erythema or rash.     Nails: There is no clubbing.  Neurological:     Mental Status: She is alert.     Diagnostics: none  Assessment and Plan:   1. Other allergic rhinitis   2. LPRD (laryngopharyngeal reflux disease)   3. History of asthma     1. Continue Nexium 40 - 1 tablet one time per day in AM  2. Continue famotidine 40mg  - 1 tablet one time per day in evening  3. If needed:    A. ProAir HFA or similar 2 inhalations every 4-6 hours  B. Mucinex DM 1-2  tablets twic a day  C. Nasal saline multiple times per day  D. Cetirizine 10mg  - 1 tablet one time per day  E. Flonase - 1 spray each nostril 3-7 times per week  4. Return in 1 year or earlier if problem  Ashlee Reyes appears to be doing very well on her current therapy which basically includes treatment directed against LPR and some occasional medication use for her rhinitis and rarely for her asthma.  Assuming she continues to do well with this plan I will see her back in his clinic in 1 year or earlier if there is a problem.   Allena Katz, MD Allergy / Immunology [place]

## 2020-05-24 NOTE — Patient Instructions (Addendum)
°  1. Continue Nexium 40 - 1 tablet one time per day in AM  2. Continue famotidine 40mg  - 1 tablet one time per day in evening  3. If needed:    A. ProAir HFA or similar 2 inhalations every 4-6 hours  B. Mucinex DM 1-2 tablets twic a day  C. Nasal saline multiple times per day  D. Cetirizine 10mg  - 1 tablet one time per day  E. Flonase - 1 spray each nostril 3-7 times per week  4. Return in 1 year or earlier if problem

## 2020-05-25 ENCOUNTER — Encounter: Payer: Self-pay | Admitting: Allergy and Immunology

## 2020-06-01 DIAGNOSIS — L821 Other seborrheic keratosis: Secondary | ICD-10-CM | POA: Diagnosis not present

## 2020-06-01 DIAGNOSIS — D224 Melanocytic nevi of scalp and neck: Secondary | ICD-10-CM | POA: Diagnosis not present

## 2020-06-01 DIAGNOSIS — Z85828 Personal history of other malignant neoplasm of skin: Secondary | ICD-10-CM | POA: Diagnosis not present

## 2020-06-01 DIAGNOSIS — L820 Inflamed seborrheic keratosis: Secondary | ICD-10-CM | POA: Diagnosis not present

## 2020-06-23 ENCOUNTER — Other Ambulatory Visit: Payer: Self-pay | Admitting: *Deleted

## 2020-06-23 DIAGNOSIS — Z95828 Presence of other vascular implants and grafts: Secondary | ICD-10-CM

## 2020-06-30 ENCOUNTER — Ambulatory Visit (HOSPITAL_COMMUNITY)
Admission: RE | Admit: 2020-06-30 | Discharge: 2020-06-30 | Disposition: A | Discharge status: Home / Self Care | Payer: Medicare Other | Source: Ambulatory Visit | Attending: Vascular Surgery | Admitting: Vascular Surgery

## 2020-06-30 ENCOUNTER — Ambulatory Visit (INDEPENDENT_AMBULATORY_CARE_PROVIDER_SITE_OTHER): Payer: Medicare Other | Admitting: Vascular Surgery

## 2020-06-30 ENCOUNTER — Other Ambulatory Visit: Payer: Self-pay

## 2020-06-30 ENCOUNTER — Encounter: Payer: Self-pay | Admitting: Vascular Surgery

## 2020-06-30 VITALS — BP 150/82 | HR 71 | Temp 97.8°F | Resp 20 | Ht 66.0 in | Wt 174.0 lb

## 2020-06-30 DIAGNOSIS — Z95828 Presence of other vascular implants and grafts: Secondary | ICD-10-CM | POA: Insufficient documentation

## 2020-06-30 DIAGNOSIS — I714 Abdominal aortic aneurysm, without rupture, unspecified: Secondary | ICD-10-CM

## 2020-06-30 NOTE — Progress Notes (Signed)
Patient is a 86 year old female who returns for follow-up today.  She underwent aneurysm stent graft repair in 2014.  She is still able to perform some daily activities but is fairly unstable on her feet and does use a walker.  Her daughter assists her considerably.  She does not have any abdominal pain.  Review of systems: She has no shortness of breath or chest pain  Physical exam:  Vitals:   06/30/20 0935  BP: (!) 150/82  Pulse: 71  Resp: 20  Temp: 97.8 F (36.6 C)  SpO2: 96%  Weight: 174 lb (78.9 kg)  Height: 5\' 6"  (1.676 m)   Abdomen: Nontender  Data: Patient had a duplex ultrasound of her aorta today.  This showed 3.2 cm diameter.  This is decreased from 5.6 cm preoperatively.  It is also decreased slightly from 2 years ago.  Assessment: Patient has adequate seal for infrarenal abdominal aortic aneurysm.  Otherwise doing well.  Overall decline in function secondary to aging process.  However, she is still fairly functional.  Plan: Patient will have a follow-up ultrasound in 2 years.  Ruta Hinds, MD [facility] Office: [phone]

## 2020-07-11 ENCOUNTER — Telehealth: Payer: Self-pay | Admitting: *Deleted

## 2020-07-11 NOTE — Telephone Encounter (Signed)
Patient's daughter Ashlee Reyes called stating that she had talked with a nurse over the weekend because her mother was having a problem with her blood pressure. Ashlee Reyes stated that her mom is doing much better today. Ashlee Reyes stated her mom's blood pressure this morning was 133/87, pulse 85, oxygen level 94% and temperature 97.0. Ashlee Reyes stated her mom's blood pressure last night was 114/71. Ashlee Reyes stated that they had company all day Saturday and that is when her mom's blood pressure went up. Ashlee Reyes stated hat her mom seems to be back to her normal self today. Ashlee Reyes stated that she does not feel that she needs to bring her mom in today. Ashlee Reyes was advised to continue to monitor her mom's blood pressure and let Dr. Glori Bickers know if it goes back up. Ashlee Reyes was given ER precautions and she verbalized understanding.

## 2020-07-11 NOTE — Telephone Encounter (Signed)
Sounds like BP came back down- re assuring

## 2020-07-11 NOTE — Telephone Encounter (Signed)
[place] Night - Client TELEPHONE ADVICE RECORD [facility] Patient Name: Ashlee Reyes Gender: Female DOB: 1921/11/05 Age: 86 Y 11 M 10 D Return Phone Number: [phone] (Primary), [phone] (Secondary) Address: [place] [address] Client [employer] Night - Client Client Site [facility] Physician Tower, [place] MD Contact Type Call Who Is Calling Patient / Member / Family / Caregiver Call Type Triage / Clinical Caller Name [name] Relationship To Patient Daughter Return Phone Number [phone] (Primary) Chief Complaint BLOOD PRESSURE HIGH - Diastolic (bottom number) 704 or greater (with symptoms) Reason for Call Symptomatic / Request for Health Information Initial Comment Caller states [name]) she is calling for her mother, Ashlee Reyes. Her blood pressure has gone up and after giving her amlodipine and it is still 174/122. Translation No Nurse Assessment Nurse: Cherre Robins, RN, Ria Comment Date/Time (Eastern Time): 07/09/2020 5:22:37 PM Confirm and document reason for call. If symptomatic, describe symptoms. ---Caller states her mother's BP is 174/122. States her mother has dementia. States she has POA for her mother. No sx. Has the patient had close contact with a person known or suspected to have the novel coronavirus illness OR traveled / lives in area with major community spread (including international travel) in the last 14 days from the onset of symptoms? * If Asymptomatic, screen for exposure and travel within the last 14 days. ---No Does the patient have any new or worsening symptoms? ---Yes Will a triage be completed? ---Yes Related visit to physician within the last 2 weeks? ---No Does the PT have any chronic conditions? (i.e. diabetes, asthma, this includes High risk factors for pregnancy, etc.) ---Yes List chronic conditions. ---dementia, HTN,  glaucoma Is this a behavioral health or substance abuse call? ---No Guidelines Guideline Title Affirmed Question Affirmed Notes Nurse Date/Time (Eastern Time) Blood Pressure - High [8] Systolic BP >= 889 OR Diastolic >= 169 AND [4] having NO Weiss-Hilton, RN, Ria Comment 07/09/2020 5:25:29 PM PLEASE NOTE: All timestamps contained within this report are represented as [place] Standard Time. CONFIDENTIALTY NOTICE: This fax transmission is intended only for the addressee. It contains information that is legally privileged, confidential or otherwise protected from use or disclosure. If you are not the intended recipient, you are strictly prohibited from reviewing, disclosing, copying using or disseminating any of this information or taking any action in reliance on or regarding this information. If you have received this fax in error, please notify us immediately by telephone so that we can arrange for its return to [place]. Phone: [phone], Toll-Free: [phone], Fax: [phone] Page: 2 of 2 Call Id: [id number] Guidelines Guideline Title Affirmed Question Affirmed Notes Nurse Date/Time Eilene Ghazi Time) cardiac or neurologic symptoms Disp. Time Eilene Ghazi Time) Disposition Final User 07/09/2020 5:21:19 PM Send to Urgent Queue Dineen Kid 07/09/2020 5:28:50 PM See HCP within 4 Hours (or PCP triage) Yes Weiss-Hilton, RN, Ivonne Andrew Disagree/Comply Comply Caller Understands Yes PreDisposition InappropriateToAsk Care Advice Given Per Guideline SEE HCP WITHIN 4 HOURS (OR PCP TRIAGE): * IF OFFICE WILL BE CLOSED AND NO PCP (PRIMARY CARE PROVIDER) SECOND-LEVEL TRIAGE: You need to be seen within the next 3 or 4 hours. A nearby Urgent Care Center [facility]) is often a good source of care. Another choice is to go to the ED. Go sooner if you become worse. CALL BACK IF: * Weakness or numbness of the face, arm or leg on one side of the body occurs * Difficulty walking, difficulty talking, or severe  headache occurs * Chest pain or difficulty breathing occurs * You become worse. CARE ADVICE given per High Blood Pressure (Adult) guideline. Referrals GO TO FACILITY UNDECIDED

## 2020-07-11 NOTE — Telephone Encounter (Signed)
[place] Night - Client TELEPHONE ADVICE RECORD [facility] Patient Name: Ashlee Reyes Gender: Female DOB: November 19, 1921 Age: 86 Y 11 M 11 D Return Phone Number: [phone] (Primary), [phone] (Secondary) Address: [place] [address] Client [place] Night - Client Client Site [facility] Tower, Ashlee Reyes - MD Contact Type Call Who Is Calling Patient / Member / Family / Caregiver Call Type Triage / Clinical Caller Name Ashlee Reyes Relationship To Patient Daughter Return Phone Number [phone] (Primary) Chief Complaint Blood Pressure High Reason for Call Symptomatic / Request for [place] states that she called earlier and spoke to a nurse practitioner and was told to call back if they are not going to the ER at this time. Caller states that her moms blood pressure is down to 140/87 Translation No Nurse Assessment Nurse: Ashlee Clonts, RN, Brandi Date/Time (Eastern Time): 07/09/2020 9:38:49 PM Confirm and document reason for call. If symptomatic, describe symptoms. ---Caller states that she called earlier and spoke to a nurse practitioner and was told to call back if they are not going to the ER at this time. Caller states that her moms blood pressure is down to 136/75 2100. No other symptoms at this time. Has the patient had close contact with a person known or suspected to have the novel coronavirus illness OR traveled / lives in area with major community spread (including international travel) in the last 14 days from the onset of symptoms? * If Asymptomatic, screen for exposure and travel within the last 14 days. ---No Does the patient have any new or worsening symptoms? ---Yes Will a triage be completed? ---Yes Related visit to physician within the last 2 weeks? ---No Does the PT have any chronic conditions? (i.e. diabetes, asthma,  this includes High risk factors for pregnancy, etc.) ---Yes List chronic conditions. ---htn, dementia, glaucoma Is this a behavioral health or substance abuse call? ---No Guidelines Guideline Title Affirmed Question Affirmed Notes Nurse Date/Time (Eastern Time) Blood Pressure - High [8] Systolic BP >= 315 OR Diastolic >= 80 AND [1] taking BP medications Ashlee Clonts, RN, Velna Hatchet 07/09/2020 9:42:12 PM PLEASE NOTE: All timestamps contained within this report are represented as [place] Standard Time. CONFIDENTIALTY NOTICE: This fax transmission is intended only for the addressee. It contains information that is legally privileged, confidential or otherwise protected from use or disclosure. If you are not the intended recipient, you are strictly prohibited from reviewing, disclosing, copying using or disseminating any of this information or taking any action in reliance on or regarding this information. If you have received this fax in error, please notify us immediately by telephone so that we can arrange for its return to [place]. Phone: [phone], Toll-Free: [phone], Fax: [phone] Page: 2 of 2 Call Id: [id number] [place]. Time Eilene Ghazi Time) Disposition Final User 07/09/2020 9:52:11 PM Called On-Call Provider Ashlee Clonts, RN, Banks 07/09/2020 9:53:00 PM See PCP within 2 [place], RN, Berdie Ogren Disagree/Comply Comply Caller Understands Yes PreDisposition Did not know what to do Care Advice Given Per Guideline SEE PCP WITHIN 2 WEEKS: * You need to be seen for this ongoing problem within the next 2 weeks. REASSURANCE AND EDUCATION: * Your blood pressure is elevated but you have told me that you are not having any symptoms. * You should see your doctor and have your blood pressure checked within 2 weeks. CALL BACK IF: * You become worse. Paging DoctorName Phone DateTime Result/Outcome Message Type Notes  Ashlee Median- DO [phone] 07/09/2020 9:52:11 PM Called On Call Provider -  Reached Doctor Paged Ashlee Median- DO 07/09/2020 9:52:50 PM Spoke with On Call - General Message Result Per Dr. Bryan Lemma, follow up with PCP on Monday. Spoke w/daughter to inform. Verbalized understanding and appreciation.

## 2020-08-06 ENCOUNTER — Other Ambulatory Visit: Payer: Self-pay | Admitting: Family Medicine

## 2020-08-08 NOTE — Telephone Encounter (Signed)
Name of Medication:Tramadol 50mg  Name of Pharmacy:[facility] [address] Last Fill or Written Date and Quantity:01/27/20#45 tabs with 3 refills Last Office Visit and Type:04/05/20 ?UTI Next Office Visit and Type:none scheduled Last Controlled Substance Agreement Date:HOSPICE/PALLIATIVE PATIENT

## 2020-08-31 ENCOUNTER — Other Ambulatory Visit: Payer: Self-pay | Admitting: Family Medicine

## 2020-08-31 NOTE — Telephone Encounter (Signed)
Last filled on 08/19/19 #20 tabs 3 refills, last OV was a ??UTI appt on 04/05/20, please advise

## 2020-09-02 ENCOUNTER — Telehealth: Payer: Self-pay

## 2020-09-02 NOTE — Telephone Encounter (Signed)
[name] (DPR signed) said pt was feeling more nauseated the last 8 days and ondansetron helps the nausea; this morning pt had been resting in a chair and was nauseated and [name] took BP 108/54 P 35, then 38 and then 40. O2 level was 96%. Pt not having any CP, or dizziness; pt has H/A on rt side of head at temple and no more SOB than usual. Pt rt hand is numb but [name] said she thought she had laid on hand while taking a nap earlier. Linda rechecked BP & P: BP 110/72 P 68. Pt has gotten up and walked to bathroom at this time with no problems. No vomiting abd pain or diarrhea. Dr Glori Bickers will review note and [name] will get cb. UC precautions given and [name] voiced understanding. [facility] [address].

## 2020-09-02 NOTE — Telephone Encounter (Signed)
She has a hx of labile bp as well as pulse and also chronic nausea.  With increased nausea she may have had a vasovagal rxn causing the change in vital signs  Continue zofran if helpful-update [place] if vomiting or worse or no improvement in the next week  Make sure she is getting fluids  Rest and change position slowly if feeling dizzy or unsteady  Eat small amounts if able (can have some more salt today) Watch for new symptoms and keep me posted

## 2020-09-05 ENCOUNTER — Other Ambulatory Visit: Payer: Self-pay | Admitting: Family Medicine

## 2020-09-05 NOTE — Telephone Encounter (Signed)
Called daughter and she said that she has checked pt's BP and heart rate sitting and standing and BP is the same it never drops, it doesn't seem like position change are affecting her. Pt drinks 8-10 cups of water a day so daughter doesn't think it's due to her needing fluids.   [name] is worried about mother and asked if she needs labs or an EKG and thinks pt should be checked out by PCP. appt scheduled this Thursday 09/08/20 (1st available), I advise [name] if sxs worsen or she develops any new sxs in the mean time to let us know. And if need be we can do labs or an EKG at her appt if Dr. Glori Bickers thinks she needs it

## 2020-09-05 NOTE — Telephone Encounter (Signed)
Sounds good- I will see her then

## 2020-09-06 ENCOUNTER — Telehealth: Payer: Self-pay | Admitting: Family Medicine

## 2020-09-06 NOTE — Telephone Encounter (Signed)
They can cancel if she is feeling better. Glad to hear it !

## 2020-09-06 NOTE — Telephone Encounter (Signed)
See prev note pt's appt is on 09/08/20

## 2020-09-06 NOTE — Telephone Encounter (Signed)
Patient's daughter called in stating mom is doing a lot better and is wondering if PCP would still like to see her. Please advise.

## 2020-09-07 NOTE — Telephone Encounter (Signed)
Ashlee Reyes called back stating they are keeping appointment tomorrow pt got up dizzy this morning not feeling week

## 2020-09-08 ENCOUNTER — Ambulatory Visit (INDEPENDENT_AMBULATORY_CARE_PROVIDER_SITE_OTHER): Payer: Medicare Other | Admitting: Family Medicine

## 2020-09-08 ENCOUNTER — Other Ambulatory Visit: Payer: Self-pay

## 2020-09-08 ENCOUNTER — Encounter: Payer: Self-pay | Admitting: Family Medicine

## 2020-09-08 VITALS — BP 122/72 | HR 82 | Temp 97.2°F | Ht 66.0 in | Wt 182.1 lb

## 2020-09-08 DIAGNOSIS — R5382 Chronic fatigue, unspecified: Secondary | ICD-10-CM | POA: Diagnosis not present

## 2020-09-08 DIAGNOSIS — R5383 Other fatigue: Secondary | ICD-10-CM | POA: Insufficient documentation

## 2020-09-08 DIAGNOSIS — N390 Urinary tract infection, site not specified: Secondary | ICD-10-CM | POA: Diagnosis not present

## 2020-09-08 DIAGNOSIS — Z79899 Other long term (current) drug therapy: Secondary | ICD-10-CM | POA: Diagnosis not present

## 2020-09-08 DIAGNOSIS — R0989 Other specified symptoms and signs involving the circulatory and respiratory systems: Secondary | ICD-10-CM | POA: Diagnosis not present

## 2020-09-08 DIAGNOSIS — R110 Nausea: Secondary | ICD-10-CM

## 2020-09-08 DIAGNOSIS — Z515 Encounter for palliative care: Secondary | ICD-10-CM

## 2020-09-08 LAB — CBC WITH DIFFERENTIAL/PLATELET
Basophils Absolute: 0.1 10*3/uL (ref 0.0–0.1)
Basophils Relative: 0.6 % (ref 0.0–3.0)
Eosinophils Absolute: 0.2 10*3/uL (ref 0.0–0.7)
Eosinophils Relative: 1.8 % (ref 0.0–5.0)
HCT: 38.1 % (ref 36.0–46.0)
Hemoglobin: 12.3 g/dL (ref 12.0–15.0)
Lymphocytes Relative: 23.4 % (ref 12.0–46.0)
Lymphs Abs: 1.9 10*3/uL (ref 0.7–4.0)
MCHC: 32.3 g/dL (ref 30.0–36.0)
MCV: 87.9 fl (ref 78.0–100.0)
Monocytes Absolute: 0.8 10*3/uL (ref 0.1–1.0)
Monocytes Relative: 9.2 % (ref 3.0–12.0)
Neutro Abs: 5.3 10*3/uL (ref 1.4–7.7)
Neutrophils Relative %: 65 % (ref 43.0–77.0)
Platelets: 240 10*3/uL (ref 150.0–400.0)
RBC: 4.33 Mil/uL (ref 3.87–5.11)
RDW: 14.6 % (ref 11.5–15.5)
WBC: 8.2 10*3/uL (ref 4.0–10.5)

## 2020-09-08 LAB — POC URINALSYSI DIPSTICK (AUTOMATED)
Bilirubin, UA: NEGATIVE
Blood, UA: POSITIVE
Glucose, UA: NEGATIVE
Ketones, UA: NEGATIVE
Leukocytes, UA: NEGATIVE
Nitrite, UA: NEGATIVE
Protein, UA: NEGATIVE
Spec Grav, UA: 1.01 (ref 1.010–1.025)
Urobilinogen, UA: 0.2 E.U./dL
pH, UA: 6.5 (ref 5.0–8.0)

## 2020-09-08 LAB — COMPREHENSIVE METABOLIC PANEL
ALT: 8 U/L (ref 0–35)
AST: 12 U/L (ref 0–37)
Albumin: 3.8 g/dL (ref 3.5–5.2)
Alkaline Phosphatase: 54 U/L (ref 39–117)
BUN: 15 mg/dL (ref 6–23)
CO2: 32 mEq/L (ref 19–32)
Calcium: 9.3 mg/dL (ref 8.4–10.5)
Chloride: 101 mEq/L (ref 96–112)
Creatinine, Ser: 0.98 mg/dL (ref 0.40–1.20)
GFR: 52.5 mL/min — ABNORMAL LOW (ref >60.00)
Glucose, Bld: 88 mg/dL (ref 70–99)
Potassium: 4.6 mEq/L (ref 3.5–5.1)
Sodium: 138 mEq/L (ref 135–145)
Total Bilirubin: 0.4 mg/dL (ref 0.2–1.2)
Total Protein: 6.4 g/dL (ref 6.0–8.3)

## 2020-09-08 LAB — TSH: TSH: 4.95 u[IU]/mL — ABNORMAL HIGH (ref 0.35–4.50)

## 2020-09-08 LAB — VITAMIN B12: Vitamin B-12: 136 pg/mL — ABNORMAL LOW (ref 211–911)

## 2020-09-08 MED ORDER — ONDANSETRON 4 MG PO TBDP: ORAL_TABLET | ORAL | 3 refills | Status: DC

## 2020-09-08 NOTE — Assessment & Plan Note (Signed)
Rev records from home Good vitals today  Not orthostatic

## 2020-09-08 NOTE — Assessment & Plan Note (Signed)
May be multifactorial  Labs done incl TSH and vit B12  Also urine culture is pending

## 2020-09-08 NOTE — Assessment & Plan Note (Signed)
Reminded caregiver to use palliative care team as needed when she has difficulty getting pt to the office  They can check in with vitals and give updates  Goal is still quality of life and comfort

## 2020-09-08 NOTE — Assessment & Plan Note (Addendum)
Acute on chronic  Reassuring exam  1 plus blood on ua -pend cx Labs done  Takes zofran prn-increased # pills per mo to 90

## 2020-09-08 NOTE — Patient Instructions (Addendum)
zofran can be given a max of 3 times daily  I wrote the px for 90 pills to get you through a month   We can consider going up on the dose to 8 mg in the future if needed   Urine culture pending  Urinalysis showed a little blood  Will check some labs today

## 2020-09-08 NOTE — Progress Notes (Signed)
Subjective:    Patient ID: Ashlee Reyes, female    DOB: September 19, 1921, 86 y.o.   MRN: [id number]  This visit occurred during the SARS-CoV-2 public health emergency.  Safety protocols were in place, including screening questions prior to the visit, additional usage of staff PPE, and extensive cleaning of exam room while observing appropriate contact time as indicated for disinfecting solutions.    HPI Pt presents with dizziness/nausea and malaise    Wt Readings from Last 3 Encounters:  09/08/20 182 lb 2 oz (82.6 kg)  06/30/20 174 lb (78.9 kg)  09/28/19 174 lb (78.9 kg)   29.40 kg/m   Nausea has been more prevalent lately anyway Also bloating- after eating   Felt dizzy when she got up today  Had a worse spell for 1-2 hours yesterday  -- BP and pulse were fine  Has a hx of very labile bp  Takes amlodipine 2.5 mg for bp over 145/90 prn   Had a headache yesterday  She is a palliative care pt  (they are on call but do not come to the house)  She is covid immunized    BP Readings from Last 3 Encounters:  09/08/20 122/72  06/30/20 (!) 150/82  05/24/20 124/72   Pulse Readings from Last 3 Encounters:  09/08/20 82  06/30/20 71  05/24/20 60   Lab Results  Component Value Date   CREATININE 0.90 (H) 11/06/2019   BUN 16 11/06/2019   NA 139 11/06/2019   K 4.4 11/06/2019   CL 103 11/06/2019   CO2 27 11/06/2019   Lab Results  Component Value Date   WBC 7.1 11/06/2019   HGB 12.4 11/06/2019   HCT 37.8 11/06/2019   MCV 86.9 11/06/2019   PLT 236 11/06/2019   Drinks lots of water   ua today  One plus blood  Otherwise normal   Results for AKSHITA, ITALIANO (MRN [id number]) as of 09/08/2020 17:28  Ref. Range 09/08/2020 12:30  Bilirubin, UA Unknown negative  Clarity, UA Unknown clear  Color, UA Unknown yellow  Glucose Latest Ref Range: Negative  Negative  Ketones, UA Unknown negative  Leukocytes,UA Latest Ref Range: Negative  Negative  Nitrite, UA Unknown  negative  pH, UA Latest Ref Range: 5.0 - 8.0  6.5  Protein,UA Latest Ref Range: Negative  Negative  Specific Gravity, UA Latest Ref Range: 1.010 - 1.025  1.010  Urobilinogen, UA Latest Ref Range: 0.2 or 1.0 E.U./dL 0.2  RBC, UA Unknown positive   Patient Active Problem List   Diagnosis Date Noted  . Nausea 09/08/2020  . Fatigue 09/08/2020  . Current use of proton pump inhibitor 09/08/2020  . Left lateral abdominal pain 11/06/2019  . Labile blood pressure 07/31/2019  . Allergic rhinitis 02/20/2019  . Palliative care status 01/12/2019  . Hypertensive urgency 12/24/2018  . Compression fracture of L1 lumbar vertebra (HCC) 09/26/2018  . Mobility impaired 09/26/2018  . History of endovascular stent graft for abdominal aortic aneurysm (AAA) 01/01/2018  . Unilateral primary osteoarthritis, left knee 07/11/2017  . Hyperlipidemia 03/12/2017  . Frequent UTI/interstitial cystitis 02/06/2016  . Hypotension 01/09/2016  . Carotid stenosis 06/01/2015  . Local reaction to immunization 02/21/2015  . Encounter for Medicare annual wellness exam 02/15/2015  . Estrogen deficiency 02/15/2015  . Sinusitis, chronic 01/12/2015  . IBS (irritable bowel syndrome) 01/12/2015  . Orthostatic hypotension 01/11/2015  . Dizziness   . Palpitations 05/13/2014  . Second degree heart block 10/18/2013  . Syncope 10/17/2013  .  Aneurysm of abdominal vessel ([place]) 06/25/2013  . Osteopenia 01/13/2013  . CAD (coronary artery disease)   . Chronic depression 06/03/2007  . GERD 06/02/2007  . Asthma   . Chronic interstitial cystitis   . Osteoarthritis   . Spinal stenosis of lumbar region   . History of urinary tract infection    Past Medical History:  Diagnosis Date  . AAA (abdominal aortic aneurysm) ([place])    followed by Dr Oneida Alar. Last u/s07/10 stable AAA with largest measurement 3.97 x 4.02cm.  . Allergic rhinitis, seasonal   . Asthma   . Atrial fibrillation ([place])   . CAD (coronary artery disease) 07/2009    mod by cath 08/10  . Complication of anesthesia   . Depression   . Disc disease, degenerative, lumbar or lumbosacral    SPINAL STENOSIS  . Diverticulosis   . GERD (gastroesophageal reflux disease)   . Hemorrhoids   . History of recurrent UTIs   . Hyperlipidemia   . IBS (irritable bowel syndrome)   . IC (irritable colon)    DMSO in past  . Interstitial cystitis   . Osteoarthritis    spine/spinal stenosis  . Osteopenia 2011  . PONV (postoperative nausea and vomiting)   . Skin cancer    basal cell cancer - face.   Past Surgical History:  Procedure Laterality Date  . ABDOMINAL AORTIC ENDOVASCULAR STENT GRAFT N/A 07/01/2013   Procedure: ABDOMINAL AORTIC ENDOVASCULAR STENT GRAFT- GORE;  Surgeon: Elam Dutch, MD;  Location: [facility];  Service: Vascular;  Laterality: N/A;  Ultrasound guided  . ABDOMINAL HYSTERECTOMY     partial not cancer  . BREAST SURGERY     lumpectomy  . CYSTOCELE REPAIR    . ESOPHAGOGASTRODUODENOSCOPY  09/1998   erosive esphagitis ? stricture treated 12/2002  . HERNIA REPAIR  29/9242   umbilical hernia  . RECTOCELE REPAIR    . REFRACTIVE SURGERY     for film after cataract surgery.  Marland Kitchen RIGHT COLECTOMY  1997   benign  . SKIN LESION EXCISION  05/2011   basal cell skin lesion removed.   Social History   Tobacco Use  . Smoking status: Never Smoker  . Smokeless tobacco: Never Used  Vaping Use  . Vaping Use: Never used  Substance Use Topics  . Alcohol use: No    Alcohol/week: 0.0 standard drinks  . Drug use: No   Family History  Problem Relation Age of Onset  . Heart disease Mother        deceased age 25 heart attack.  Marland Kitchen Heart attack Mother   . Other Mother        varicose veins  . Varicose Veins Mother   . Heart disease Father        age 38 heart attack  . Cancer Father   . Stroke Brother   . Cancer Brother   . Heart disease Brother   . Heart attack Brother   . Cancer Daughter   . Diabetes Son    Allergies  Allergen Reactions  .  Amoxicillin-Pot Clavulanate Nausea And Vomiting    Has patient had a PCN reaction causing immediate rash, facial/tongue/throat swelling, SOB or lightheadedness with hypotension: No Has patient had a PCN reaction causing severe rash involving mucus membranes or skin necrosis: No Has patient had a PCN reaction that required hospitalization: No Has patient had a PCN reaction occurring within the last 10 years: No If all of the above answers are "NO", then may proceed with Cephalosporin  use.   . Avelox [Moxifloxacin] Nausea And Vomiting  . Flovent Hfa [Fluticasone] Other (See Comments)    "makes her feel funny" per family member  . Ibuprofen Other (See Comments)    GI upset  . Keflex [Cephalexin] Other (See Comments)    abd pain / GI upset  . Nitrofurantoin Other (See Comments)    Chest pain or indigestion with nausea  . Omeprazole Nausea Only  . Prevnar [Pneumococcal 13-Val Conj Vacc] Itching    Severe local reaction with redness and itching   . Rosuvastatin Other (See Comments)    Foot pain  . Sertraline Hcl Other (See Comments)    Made her more depressed  . Tetanus Toxoids     Local redness and swelling    Current Outpatient Medications on File Prior to Visit  Medication Sig Dispense Refill  . acetaminophen (TYLENOL) 500 MG tablet Take 1 tablet (500 mg total) by mouth every 4 (four) hours as needed for mild pain. 30 tablet 0  . amLODipine (NORVASC) 2.5 MG tablet Take one pill by mouth daily as needed for elevated blood pressure (over 145/90) 30 tablet 11  . beclomethasone (QVAR REDIHALER) 80 MCG/ACT inhaler Inhale 2 puffs into the lungs 2 (two) times daily. 1 Inhaler 0  . cetirizine (ZYRTEC) 10 MG tablet Take 1 tablet (10 mg total) by mouth daily as needed. 30 tablet 5  . clopidogrel (PLAVIX) 75 MG tablet TAKE 1 TABLET(75 MG) BY MOUTH DAILY 30 tablet 11  . docusate sodium (COLACE) 100 MG capsule Take 1 capsule (100 mg total) by mouth every evening. 10 capsule 0  . esomeprazole  (NEXIUM) 40 MG capsule TAKE 1 CAPSULE(40 MG) BY MOUTH TWICE DAILY BEFORE A MEAL 60 capsule 1  . famotidine (PEPCID) 40 MG tablet Take 1 tablet (40 mg total) by mouth daily. 30 tablet 5  . fluticasone (FLONASE) 50 MCG/ACT nasal spray Place 2 sprays into both nostrils daily. 16 g 11  . LUMIGAN 0.01 % SOLN Place 1 drop into both eyes at bedtime.    [name] Glycol-Propyl Glycol (SYSTANE OP) Place 1 drop into both eyes every 8 (eight) hours as needed (dry eyes).     . polyethylene glycol (MIRALAX / GLYCOLAX) packet Take 17 g by mouth daily as needed for mild constipation. 14 each 0  . Probiotic Product (ALIGN) 4 MG CAPS Take 1 capsule (4 mg total) by mouth daily.    . traMADol (ULTRAM) 50 MG tablet TAKE 1/2 TABLET EVERY MORNING AND 1 AT BEDTIME 45 tablet 3  . trimethoprim (TRIMPEX) 100 MG tablet Take 1 tablet (100 mg total) by mouth daily with lunch. 30 tablet 0  . albuterol (VENTOLIN HFA) 108 (90 Base) MCG/ACT inhaler INHALE 2 PUFFS BY MOUTH EVERY 4 HOURS AS NEEDED FOR WHEEZING (Patient not taking: Reported on 09/08/2020) 18 g 1   No current facility-administered medications on file prior to visit.    Review of Systems  Constitutional: Positive for fatigue. Negative for activity change, appetite change, fever and unexpected weight change.  HENT: Negative for congestion, ear pain, rhinorrhea, sinus pressure and sore throat.   Eyes: Negative for pain, redness and visual disturbance.  Respiratory: Negative for cough, shortness of breath and wheezing.   Cardiovascular: Negative for chest pain and palpitations.  Gastrointestinal: Positive for nausea. Negative for abdominal distention, abdominal pain, anal bleeding, blood in stool, constipation, diarrhea, rectal pain and vomiting.  Endocrine: Negative for polydipsia and polyuria.  Genitourinary: Positive for dysuria, frequency and urgency.  Musculoskeletal: Positive for arthralgias and back pain. Negative for myalgias.  Skin: Negative for pallor and  rash.  Allergic/Immunologic: Negative for environmental allergies.  Neurological: Positive for dizziness. Negative for syncope and headaches.  Hematological: Negative for adenopathy. Does not bruise/bleed easily.  Psychiatric/Behavioral: Negative for decreased concentration and dysphoric mood. The patient is not nervous/anxious.        Objective:   Physical Exam Constitutional:      General: She is not in acute distress.    Appearance: Normal appearance. She is well-developed. She is obese. She is not ill-appearing.     Comments: Frail appearing elderly female in wheelchair  HENT:     Head: Normocephalic and atraumatic.     Mouth/Throat:     Mouth: Mucous membranes are moist.  Eyes:     General: No scleral icterus.    Conjunctiva/sclera: Conjunctivae normal.     Pupils: Pupils are equal, round, and reactive to light.  Neck:     Thyroid: No thyromegaly.     Vascular: No carotid bruit or JVD.  Cardiovascular:     Rate and Rhythm: Normal rate and regular rhythm.     Heart sounds: Normal heart sounds. No gallop.   Pulmonary:     Effort: Pulmonary effort is normal. No respiratory distress.     Breath sounds: Normal breath sounds. No wheezing or rales.  Abdominal:     General: Bowel sounds are normal. There is no distension or abdominal bruit.     Palpations: Abdomen is soft. There is no mass.     Tenderness: There is no abdominal tenderness. There is no right CVA tenderness, left CVA tenderness, guarding or rebound.     Hernia: No hernia is present.     Comments: No suprapubic tenderness or fullness    Musculoskeletal:     Cervical back: Normal range of motion and neck supple. No tenderness.     Right lower leg: No edema.     Left lower leg: No edema.  Lymphadenopathy:     Cervical: No cervical adenopathy.  Skin:    General: Skin is warm and dry.     Coloration: Skin is not pale.     Findings: No erythema or rash.  Neurological:     Mental Status: She is alert.      Sensory: No sensory deficit.     Motor: Weakness present.     Deep Tendon Reflexes: Reflexes are normal and symmetric.     Comments: Generalized weakness  Needs help with ambulation   Psychiatric:        Mood and Affect: Mood normal.           Assessment & Plan:   Problem List Items Addressed This Visit      Genitourinary   Frequent UTI/interstitial cystitis    ua dips pos for 1 plus blood -otherwise clear Sent for culture -will tx if positive Enc water intake Will call if symptoms worsen before then      Relevant Orders   POCT Urinalysis Dipstick (Automated) (Completed)   Urine Culture     Other   Palliative care status    Reminded caregiver to use palliative care team as needed when she has difficulty getting pt to the office  They can check in with vitals and give updates  Goal is still quality of life and comfort      Labile blood pressure    Rev records from home Good vitals today  Not orthostatic  Relevant Orders   CBC with Differential/Platelet (Completed)   Comprehensive metabolic panel (Completed)   Nausea - Primary    Acute on chronic  Reassuring exam  1 plus blood on ua -pend cx Labs done  Takes zofran prn-increased # pills per mo to 90      Relevant Orders   POCT Urinalysis Dipstick (Automated) (Completed)   Urine Culture   CBC with Differential/Platelet (Completed)   Comprehensive metabolic panel (Completed)   Vitamin B12 (Completed)   Fatigue    May be multifactorial  Labs done incl TSH and vit B12  Also urine culture is pending       Relevant Orders   TSH (Completed)   Vitamin B12 (Completed)   Current use of proton pump inhibitor    High risk for low B12- lab done in light of malaise       Relevant Orders   Vitamin B12 (Completed)

## 2020-09-08 NOTE — Assessment & Plan Note (Signed)
High risk for low B12- lab done in light of malaise

## 2020-09-08 NOTE — Assessment & Plan Note (Signed)
ua dips pos for 1 plus blood -otherwise clear Sent for culture -will tx if positive Enc water intake Will call if symptoms worsen before then

## 2020-09-09 ENCOUNTER — Other Ambulatory Visit (INDEPENDENT_AMBULATORY_CARE_PROVIDER_SITE_OTHER): Payer: Medicare Other

## 2020-09-09 DIAGNOSIS — R7989 Other specified abnormal findings of blood chemistry: Secondary | ICD-10-CM

## 2020-09-09 LAB — URINE CULTURE
MICRO NUMBER:: 10987822
SPECIMEN QUALITY:: ADEQUATE

## 2020-09-09 LAB — T4, FREE: Free T4: 0.61 ng/dL (ref 0.60–1.60)

## 2020-09-15 ENCOUNTER — Other Ambulatory Visit: Payer: Self-pay

## 2020-09-15 ENCOUNTER — Ambulatory Visit (INDEPENDENT_AMBULATORY_CARE_PROVIDER_SITE_OTHER): Payer: Medicare Other

## 2020-09-15 DIAGNOSIS — E538 Deficiency of other specified B group vitamins: Secondary | ICD-10-CM

## 2020-09-15 MED ORDER — CYANOCOBALAMIN 1000 MCG/ML IJ SOLN
1000.0000 ug | Freq: Once | INTRAMUSCULAR | Status: AC
  Administered 2020-09-15: 1000 ug via INTRAMUSCULAR

## 2020-09-15 NOTE — Progress Notes (Signed)
Per orders of Dr. Elsie Stain, injection of vitamin b12 given in right deltoid by Kem Parkinson cma. Patient tolerated injection well.

## 2020-09-16 ENCOUNTER — Telehealth: Payer: Self-pay | Admitting: *Deleted

## 2020-09-16 NOTE — Telephone Encounter (Signed)
Ashlee Reyes advised it was 500 mcg and she can start taking it now

## 2020-09-16 NOTE — Telephone Encounter (Signed)
Patient's daughter Ashlee Reyes left a voicemail requesting that [place] call her. Ashlee Reyes stated that she can not remember if her mom is to take an oral B-12 supplement in addition to the injection. Ashlee Reyes requested a call back from [place].

## 2020-09-22 ENCOUNTER — Ambulatory Visit (INDEPENDENT_AMBULATORY_CARE_PROVIDER_SITE_OTHER): Payer: Medicare Other

## 2020-09-22 ENCOUNTER — Other Ambulatory Visit: Payer: Self-pay

## 2020-09-22 DIAGNOSIS — E538 Deficiency of other specified B group vitamins: Secondary | ICD-10-CM

## 2020-09-22 MED ORDER — CYANOCOBALAMIN 1000 MCG/ML IJ SOLN
1000.0000 ug | Freq: Once | INTRAMUSCULAR | Status: AC
  Administered 2020-09-22: 1000 ug via INTRAMUSCULAR

## 2020-09-22 NOTE — Progress Notes (Signed)
Per orders of Dr. Tower, injection of vit B12 given by Shakina Choy. Patient tolerated injection well.  

## 2020-09-23 DIAGNOSIS — R350 Frequency of micturition: Secondary | ICD-10-CM | POA: Diagnosis not present

## 2020-09-23 DIAGNOSIS — N3020 Other chronic cystitis without hematuria: Secondary | ICD-10-CM | POA: Diagnosis not present

## 2020-09-29 ENCOUNTER — Other Ambulatory Visit: Payer: Self-pay

## 2020-09-29 ENCOUNTER — Ambulatory Visit (INDEPENDENT_AMBULATORY_CARE_PROVIDER_SITE_OTHER): Payer: Medicare Other

## 2020-09-29 DIAGNOSIS — E538 Deficiency of other specified B group vitamins: Secondary | ICD-10-CM | POA: Diagnosis not present

## 2020-09-29 MED ORDER — CYANOCOBALAMIN 1000 MCG/ML IJ SOLN
1000.0000 ug | Freq: Once | INTRAMUSCULAR | Status: AC
  Administered 2020-09-29: 1000 ug via INTRAMUSCULAR

## 2020-09-29 NOTE — Progress Notes (Signed)
Per orders of Dr.Graham Duncan, injection of vitamin b12 given by Kem Parkinson. Patient tolerated injection well.

## 2020-10-03 ENCOUNTER — Other Ambulatory Visit: Payer: Medicare Other | Admitting: Adult Health Nurse Practitioner

## 2020-10-03 ENCOUNTER — Other Ambulatory Visit: Payer: Self-pay

## 2020-10-03 DIAGNOSIS — E538 Deficiency of other specified B group vitamins: Secondary | ICD-10-CM

## 2020-10-03 DIAGNOSIS — Z515 Encounter for palliative care: Secondary | ICD-10-CM

## 2020-10-03 DIAGNOSIS — F0390 Unspecified dementia without behavioral disturbance: Secondary | ICD-10-CM

## 2020-10-03 NOTE — Progress Notes (Signed)
[facility] Consult Note Telephone: [phone]  Fax: [phone]  PATIENT NAME: Ashlee Reyes DOB: [birth year] MRN: [id number]  PRIMARY CARE PROVIDER:   Abner Greenspan, MD  REFERRING PROVIDER:  Abner Greenspan, MD [address] [place],  [place] [address]  RESPONSIBLE PARTY:   [name], daughter [phone]    RECOMMENDATIONS and PLAN:  1.  Advanced care planning.  Patient is a DNR  2.  Functional status.  Patient ambulates with a walker.  She requires help getting in out of the shower and needs help with set up for her shower and her daughter helps her scrub her back.  She can dress herself except needs help putting on her bra.  She can feed herself and toilets herself.  Has occasional incontinence of bladder. Is continent of bowel.  Patient sleeps well.  Appetite is good with no weight loss. Current weight is 182 pounds with BMI of 29.40.  Continue supportive care at home  3.  Symptom management.  Patient's symptoms are improved with Vitamin B12 supplementation.  Continue vitamin B12 as ordered.  Does get lower back pain and pain in left knee.  She takes Tramadol 50 mg 1 tab at bedtime and tylenol PRN throughout the day with good relief.  Continue current pain regimen  Patient back at baseline.  No reports of falls, infections, or hospital visits since last visit. Palliative will continue to monitor for symptom management and decline and make recommendations as needed.  Next appointment in 3 months.  Encouraged to call with any questions or concerns.  I spent 60 minutes providing this consultation,  from 11:00 to 12:00 including time spent with patient/family, chart review, provider coordination, documentation. More than 50% of the time in this consultation was spent coordinating communication.   HISTORY OF PRESENT ILLNESS:  Ashlee Reyes is a 86 y.o. year old female with multiple medical problems including  dementia, vitamin B12 deficiency, HTN, IBS, OA of left knee, lumbar spinal stenosis, h/o skin cancer. Palliative Care was asked to help address goals of care. Patient had been having drowsiness, weakness, increased SOB. Was evaluated in PCP office on 09/08/20 and found to have vitamin B12 deficiency.  She will finish her 4 weeks of vitamin B12 injections this week and she has been taking oral vitamin B12 500 mcg daily. She has repeat labs next week.  She had been feeling better and denies weakness, SOB, or nausea today.  Denies headaches, dizziness, chest pain, cough, sore throat, fever, N/V/D, constipation, dysuria, hematuria.  CODE STATUS: DNR  PPS: 50% HOSPICE ELIGIBILITY/DIAGNOSIS: TBD  PHYSICAL EXAM:  BP 126/78  HR 76 O2 98% on RA General: NAD, frail appearing Cardiovascular: regular rate and rhythm Pulmonary: lung sounds clear; normal respiratory effort Abdomen: soft, nontender, + bowel sounds GU: no suprapubic tenderness Extremities: trace edema, no joint deformities Skin: no rashes on exposed skin Neurological: Weakness; A&O to person and place   PAST MEDICAL HISTORY:  Past Medical History:  Diagnosis Date  . AAA (abdominal aortic aneurysm) ([place])    followed by Dr Oneida Alar. Last u/s07/10 stable AAA with largest measurement 3.97 x 4.02cm.  . Allergic rhinitis, seasonal   . Asthma   . Atrial fibrillation ([place])   . CAD (coronary artery disease) 07/2009   mod by cath 08/10  . Complication of anesthesia   . Depression   . Disc disease, degenerative, lumbar or lumbosacral    SPINAL STENOSIS  .  Diverticulosis   . GERD (gastroesophageal reflux disease)   . Hemorrhoids   . History of recurrent UTIs   . Hyperlipidemia   . IBS (irritable bowel syndrome)   . IC (irritable colon)    DMSO in past  . Interstitial cystitis   . Osteoarthritis    spine/spinal stenosis  . Osteopenia 2011  . PONV (postoperative nausea and vomiting)   . Skin cancer    basal cell cancer - face.      SOCIAL HX:  Social History   Tobacco Use  . Smoking status: Never Smoker  . Smokeless tobacco: Never Used  Substance Use Topics  . Alcohol use: No    Alcohol/week: 0.0 standard drinks    ALLERGIES:  Allergies  Allergen Reactions  . Amoxicillin-Pot Clavulanate Nausea And Vomiting    Has patient had a PCN reaction causing immediate rash, facial/tongue/throat swelling, SOB or lightheadedness with hypotension: No Has patient had a PCN reaction causing severe rash involving mucus membranes or skin necrosis: No Has patient had a PCN reaction that required hospitalization: No Has patient had a PCN reaction occurring within the last 10 years: No If all of the above answers are "NO", then may proceed with Cephalosporin use.   . Avelox [Moxifloxacin] Nausea And Vomiting  . Flovent Hfa [Fluticasone] Other (See Comments)    "makes her feel funny" per family member  . Ibuprofen Other (See Comments)    GI upset  . Keflex [Cephalexin] Other (See Comments)    abd pain / GI upset  . Nitrofurantoin Other (See Comments)    Chest pain or indigestion with nausea  . Omeprazole Nausea Only  . Prevnar [Pneumococcal 13-Val Conj Vacc] Itching    Severe local reaction with redness and itching   . Rosuvastatin Other (See Comments)    Foot pain  . Sertraline Hcl Other (See Comments)    Made her more depressed  . Tetanus Toxoids     Local redness and swelling      PERTINENT MEDICATIONS:  Outpatient Encounter Medications as of 10/03/2020  Medication Sig  . acetaminophen (TYLENOL) 500 MG tablet Take 1 tablet (500 mg total) by mouth every 4 (four) hours as needed for mild pain.  Marland Kitchen albuterol (VENTOLIN HFA) 108 (90 Base) MCG/ACT inhaler INHALE 2 PUFFS BY MOUTH EVERY 4 HOURS AS NEEDED FOR WHEEZING (Patient not taking: Reported on 09/08/2020)  . amLODipine (NORVASC) 2.5 MG tablet Take one pill by mouth daily as needed for elevated blood pressure (over 145/90)  . beclomethasone (QVAR REDIHALER) 80 MCG/ACT  inhaler Inhale 2 puffs into the lungs 2 (two) times daily.  . cetirizine (ZYRTEC) 10 MG tablet Take 1 tablet (10 mg total) by mouth daily as needed.  . clopidogrel (PLAVIX) 75 MG tablet TAKE 1 TABLET(75 MG) BY MOUTH DAILY  . docusate sodium (COLACE) 100 MG capsule Take 1 capsule (100 mg total) by mouth every evening.  Marland Kitchen esomeprazole (NEXIUM) 40 MG capsule TAKE 1 CAPSULE(40 MG) BY MOUTH TWICE DAILY BEFORE A MEAL  . famotidine (PEPCID) 40 MG tablet Take 1 tablet (40 mg total) by mouth daily.  . fluticasone (FLONASE) 50 MCG/ACT nasal spray Place 2 sprays into both nostrils daily.  Marland Kitchen LUMIGAN 0.01 % SOLN Place 1 drop into both eyes at bedtime.  . ondansetron (ZOFRAN-ODT) 4 MG disintegrating tablet DISSOLVE 1 TABLET(4 MG) ON THE TONGUE EVERY 8 HOURS AS NEEDED FOR NAUSEA OR VOMITING  . Polyethyl Glycol-Propyl Glycol (SYSTANE OP) Place 1 drop into both eyes every 8 (eight)  hours as needed (dry eyes).   . polyethylene glycol (MIRALAX / GLYCOLAX) packet Take 17 g by mouth daily as needed for mild constipation.  . Probiotic Product (ALIGN) 4 MG CAPS Take 1 capsule (4 mg total) by mouth daily.  . traMADol (ULTRAM) 50 MG tablet TAKE 1/2 TABLET EVERY MORNING AND 1 AT BEDTIME  . trimethoprim (TRIMPEX) 100 MG tablet Take 1 tablet (100 mg total) by mouth daily with lunch.   No facility-administered encounter medications on file as of 10/03/2020.     Lajuan Godbee Jenetta Downer, NP

## 2020-10-06 ENCOUNTER — Ambulatory Visit (INDEPENDENT_AMBULATORY_CARE_PROVIDER_SITE_OTHER): Payer: Medicare Other

## 2020-10-06 ENCOUNTER — Other Ambulatory Visit: Payer: Self-pay

## 2020-10-06 DIAGNOSIS — E538 Deficiency of other specified B group vitamins: Secondary | ICD-10-CM

## 2020-10-06 MED ORDER — CYANOCOBALAMIN 1000 MCG/ML IJ SOLN
1000.0000 ug | Freq: Once | INTRAMUSCULAR | Status: AC
  Administered 2020-10-06: 1000 ug via INTRAMUSCULAR

## 2020-10-06 NOTE — Progress Notes (Signed)
Per orders of Dr. Tower, injection of vit B12 given by Axyl Sitzman. Patient tolerated injection well.  

## 2020-10-09 ENCOUNTER — Other Ambulatory Visit: Payer: Self-pay | Admitting: Family Medicine

## 2020-10-11 ENCOUNTER — Telehealth: Payer: Self-pay | Admitting: Adult Health Nurse Practitioner

## 2020-10-11 NOTE — Telephone Encounter (Signed)
Daughter calling to ask if okay to take zofran preventively.  Her mother usually has nausea 1-1.5 hours after breakfast.  Instructed that it is okay to take prior to known times when patient has nausea.  Answered her questions.  She expressed understanding.  Encouraged to call with any questions or concerns. Zaakirah Kistner K. Olena Heckle NP

## 2020-10-12 ENCOUNTER — Telehealth: Payer: Self-pay | Admitting: Family Medicine

## 2020-10-12 DIAGNOSIS — E538 Deficiency of other specified B group vitamins: Secondary | ICD-10-CM | POA: Insufficient documentation

## 2020-10-12 NOTE — Telephone Encounter (Signed)
-----   Message from Ellamae Sia sent at 09/26/2020  2:34 PM EDT ----- Regarding: Lab orders for Thursday, 10.28.21 Lab orders for B12?

## 2020-10-13 ENCOUNTER — Other Ambulatory Visit (INDEPENDENT_AMBULATORY_CARE_PROVIDER_SITE_OTHER): Payer: Medicare Other

## 2020-10-13 ENCOUNTER — Ambulatory Visit (INDEPENDENT_AMBULATORY_CARE_PROVIDER_SITE_OTHER): Payer: Medicare Other

## 2020-10-13 DIAGNOSIS — E538 Deficiency of other specified B group vitamins: Secondary | ICD-10-CM | POA: Diagnosis not present

## 2020-10-13 DIAGNOSIS — Z23 Encounter for immunization: Secondary | ICD-10-CM

## 2020-10-13 LAB — VITAMIN B12: Vitamin B-12: 498 pg/mL (ref 211–911)

## 2020-11-28 ENCOUNTER — Other Ambulatory Visit: Payer: Self-pay | Admitting: Allergy and Immunology

## 2020-11-28 DIAGNOSIS — J3089 Other allergic rhinitis: Secondary | ICD-10-CM

## 2020-11-30 ENCOUNTER — Telehealth: Payer: Self-pay | Admitting: Family Medicine

## 2020-11-30 DIAGNOSIS — E538 Deficiency of other specified B group vitamins: Secondary | ICD-10-CM

## 2020-11-30 NOTE — Telephone Encounter (Signed)
-----   Message from Cloyd Stagers, RT sent at 11/16/2020  2:54 PM EST ----- Regarding: Lab Orders for Thursday 12.16.2021 Please place lab orders for Thursday 12.16.2021, appt notes state "recheck Vit B12" Thank you, Dyke Maes RT(R)

## 2020-12-01 ENCOUNTER — Other Ambulatory Visit (INDEPENDENT_AMBULATORY_CARE_PROVIDER_SITE_OTHER): Payer: Medicare Other

## 2020-12-01 DIAGNOSIS — E538 Deficiency of other specified B group vitamins: Secondary | ICD-10-CM | POA: Diagnosis not present

## 2020-12-01 LAB — VITAMIN B12: Vitamin B-12: 424 pg/mL (ref 211–911)

## 2020-12-13 ENCOUNTER — Ambulatory Visit: Payer: Medicare Other | Attending: Internal Medicine

## 2020-12-13 ENCOUNTER — Other Ambulatory Visit: Payer: Self-pay | Admitting: Internal Medicine

## 2020-12-13 DIAGNOSIS — Z23 Encounter for immunization: Secondary | ICD-10-CM

## 2020-12-13 NOTE — Progress Notes (Signed)
   Covid-19 Vaccination [facility]  Name:  Ajai Terhaar    MRN: [id number] DOB: September 23, 1921  12/13/2020  Ms. Kassa was observed post Covid-19 immunization for 15 minutes without incident. She was provided with Vaccine Information Sheet and instruction to access the V-Safe system.   Ms. Matton was instructed to call 911 with any severe reactions post vaccine: Marland Kitchen Difficulty breathing  . Swelling of face and throat  . A fast heartbeat  . A bad rash all over body  . Dizziness and weakness   Immunizations Administered    Name Date Dose VIS Date Route   Pfizer COVID-19 Vaccine 12/13/2020  2:20 PM 0.3 mL 10/05/2020 Intramuscular   Manufacturer: [employer], [employer]   Lot: [id number]   NDC: [address]-6

## 2020-12-14 DIAGNOSIS — H00012 Hordeolum externum right lower eyelid: Secondary | ICD-10-CM | POA: Diagnosis not present

## 2020-12-26 ENCOUNTER — Other Ambulatory Visit: Payer: Medicare Other | Admitting: Adult Health Nurse Practitioner

## 2020-12-26 ENCOUNTER — Other Ambulatory Visit: Payer: Self-pay

## 2020-12-26 DIAGNOSIS — Z515 Encounter for palliative care: Secondary | ICD-10-CM | POA: Diagnosis not present

## 2020-12-26 DIAGNOSIS — R110 Nausea: Secondary | ICD-10-CM | POA: Diagnosis not present

## 2020-12-26 DIAGNOSIS — F0390 Unspecified dementia without behavioral disturbance: Secondary | ICD-10-CM

## 2020-12-26 NOTE — Progress Notes (Signed)
[facility] Consult Note Telephone: [phone]  Fax: [phone]  PATIENT NAME: Ashlee Reyes DOB: [birth year] MRN: [id number]  PRIMARY CARE PROVIDER:   Abner Greenspan, MD  REFERRING PROVIDER:  Abner Greenspan, MD [address] [place],  [place] [address]  RESPONSIBLE PARTY:  [name], daughter [phone]  Chief complaint:  Follow up palliative visit/nausea    RECOMMENDATIONS and PLAN:  1.  Advanced care planning.  Patient is a DNR  2.  Dementia.  Functional status is unchanged with no reported falls.  Daughter does state that her mother's memory is declining and understands this is to be expected with a the dementia.    3.  Nausea.  This has been a long time symptom and the excessive thirst has been too.  Discussed that her nausea could be related to electrolyte imbalances due to hemodilution.  This could also be related to an underlying condition that has not been diagnosed, such as Cushing's or diabetes.  Discussed how much she wanted her mother to go back and forth to appointments to diagnosis and adding further medications at 65 yo.  Daughter did not want to have to take her out more than necessary.  Also discussed that at her annual checkups if anything were concerning on her blood work that this would have been followed up on.  Discussed continuing with the pedialyte as this does seem to be helping with her nausea and to add some in the afternoon if she starts feeling nauseous.    Patient overall doing well.  No reported falls, infections, or hospital visits since last visit. Palliative will continue to monitor for symptom management/decline and make recommendations as needed.  Follow up in 3 months.  Encouraged to call with any questions or concerns.   I spent 45 minutes providing this consultation. More than 50% of the time in this consultation was spent coordinating communication.   HISTORY OF PRESENT ILLNESS:   Ashlee Reyes is a 87 y.o. year old female with multiple medical problems including dementia, vitamin B12 deficiency, HTN, IBS, OA of left knee, lumbar spinal stenosis, h/o skin cancer. Palliative Care was asked to help address goals of care. Patient does have nausea every morning and sometimes in the evenings.  Gets good relief with zofran.  Daughter does state that her mother drinks about 16 cups of water per day.  She is able to go to the bathroom by herself.  Daughter states that she has been giving her pedialyte in the morning which seems to be helping with the nausea. She ambulates with a walker.  Her portions are not as big as what they were about a year ago but weight stable in the 180s.  Denies headaches, dizziness, chest pain, cough, sore throat, fever, diarrhea, constipation, dysuria, hematuria. Daughter does state that there has been COVID in the family but they have not had any contact with sick family members or any signs or symptoms of COVID.    CODE STATUS: DNR  PPS: 50% HOSPICE ELIGIBILITY/DIAGNOSIS: TBD  PHYSICAL EXAM:  BP 129/90 HR 78 O2 95% on RA General: NAD, frail appearing Eyes: sclera anicteric and noninjected with no discharge noted ENMT: moist mucous membranes Cardiovascular: regular rate and rhythm Pulmonary: lung sounds clear; normal respiratory effort Abdomen: soft, nontender, + bowel sounds GU: no suprapubic tenderness Extremities: trace edema, no joint deformities Skin: no rashes on exposed skin Neurological: Weakness; A&O to person and place  PAST MEDICAL HISTORY:  Past Medical History:  Diagnosis Date  . AAA (abdominal aortic aneurysm) ([place])    followed by Dr Oneida Alar. Last u/s07/10 stable AAA with largest measurement 3.97 x 4.02cm.  . Allergic rhinitis, seasonal   . Asthma   . Atrial fibrillation ([place])   . CAD (coronary artery disease) 07/2009   mod by cath 08/10  . Complication of anesthesia   . Depression   . Disc disease, degenerative,  lumbar or lumbosacral    SPINAL STENOSIS  . Diverticulosis   . GERD (gastroesophageal reflux disease)   . Hemorrhoids   . History of recurrent UTIs   . Hyperlipidemia   . IBS (irritable bowel syndrome)   . IC (irritable colon)    DMSO in past  . Interstitial cystitis   . Osteoarthritis    spine/spinal stenosis  . Osteopenia 2011  . PONV (postoperative nausea and vomiting)   . Skin cancer    basal cell cancer - face.    SOCIAL HX:  Social History   Tobacco Use  . Smoking status: Never Smoker  . Smokeless tobacco: Never Used  Substance Use Topics  . Alcohol use: No    Alcohol/week: 0.0 standard drinks    ALLERGIES:  Allergies  Allergen Reactions  . Amoxicillin-Pot Clavulanate Nausea And Vomiting    Has patient had a PCN reaction causing immediate rash, facial/tongue/throat swelling, SOB or lightheadedness with hypotension: No Has patient had a PCN reaction causing severe rash involving mucus membranes or skin necrosis: No Has patient had a PCN reaction that required hospitalization: No Has patient had a PCN reaction occurring within the last 10 years: No If all of the above answers are "NO", then may proceed with Cephalosporin use.   . Avelox [Moxifloxacin] Nausea And Vomiting  . Flovent Hfa [Fluticasone] Other (See Comments)    "makes her feel funny" per family member  . Ibuprofen Other (See Comments)    GI upset  . Keflex [Cephalexin] Other (See Comments)    abd pain / GI upset  . Nitrofurantoin Other (See Comments)    Chest pain or indigestion with nausea  . Omeprazole Nausea Only  . Prevnar [Pneumococcal 13-Val Conj Vacc] Itching    Severe local reaction with redness and itching   . Rosuvastatin Other (See Comments)    Foot pain  . Sertraline Hcl Other (See Comments)    Made her more depressed  . Tetanus Toxoids     Local redness and swelling      PERTINENT MEDICATIONS:  Outpatient Encounter Medications as of 12/26/2020  Medication Sig  . acetaminophen  (TYLENOL) 500 MG tablet Take 1 tablet (500 mg total) by mouth every 4 (four) hours as needed for mild pain.  Marland Kitchen albuterol (VENTOLIN HFA) 108 (90 Base) MCG/ACT inhaler INHALE 2 PUFFS BY MOUTH EVERY 4 HOURS AS NEEDED FOR WHEEZING (Patient not taking: Reported on 09/08/2020)  . amLODipine (NORVASC) 2.5 MG tablet Take one pill by mouth daily as needed for elevated blood pressure (over 145/90)  . beclomethasone (QVAR REDIHALER) 80 MCG/ACT inhaler Inhale 2 puffs into the lungs 2 (two) times daily.  . cetirizine (ZYRTEC) 10 MG tablet TAKE 1 TABLET(10 MG) BY MOUTH DAILY AS NEEDED  . clopidogrel (PLAVIX) 75 MG tablet TAKE 1 TABLET(75 MG) BY MOUTH DAILY  . docusate sodium (COLACE) 100 MG capsule Take 1 capsule (100 mg total) by mouth every evening.  Marland Kitchen esomeprazole (NEXIUM) 40 MG capsule TAKE 1 CAPSULE(40 MG) BY MOUTH TWICE DAILY BEFORE A MEAL  .  famotidine (PEPCID) 40 MG tablet Take 1 tablet (40 mg total) by mouth daily.  . fluticasone (FLONASE) 50 MCG/ACT nasal spray Place 2 sprays into both nostrils daily.  Marland Kitchen LUMIGAN 0.01 % SOLN Place 1 drop into both eyes at bedtime.  . ondansetron (ZOFRAN-ODT) 4 MG disintegrating tablet DISSOLVE 1 TABLET(4 MG) ON THE TONGUE EVERY 8 HOURS AS NEEDED FOR NAUSEA OR VOMITING  . Polyethyl Glycol-Propyl Glycol (SYSTANE OP) Place 1 drop into both eyes every 8 (eight) hours as needed (dry eyes).   . polyethylene glycol (MIRALAX / GLYCOLAX) packet Take 17 g by mouth daily as needed for mild constipation.  . Probiotic Product (ALIGN) 4 MG CAPS Take 1 capsule (4 mg total) by mouth daily.  . traMADol (ULTRAM) 50 MG tablet TAKE 1/2 TABLET EVERY MORNING AND 1 AT BEDTIME  . trimethoprim (TRIMPEX) 100 MG tablet Take 1 tablet (100 mg total) by mouth daily with lunch.   No facility-administered encounter medications on file as of 12/26/2020.    Khayri Kargbo Jenetta Downer, NP

## 2021-01-05 ENCOUNTER — Other Ambulatory Visit: Payer: Self-pay | Admitting: Family Medicine

## 2021-01-05 NOTE — Telephone Encounter (Signed)
Last OV 09/08/20 Last refill 08/08/20 #45/3 Next OV not scheduled

## 2021-01-10 DIAGNOSIS — H401133 Primary open-angle glaucoma, bilateral, severe stage: Secondary | ICD-10-CM | POA: Diagnosis not present

## 2021-01-10 DIAGNOSIS — H04123 Dry eye syndrome of bilateral lacrimal glands: Secondary | ICD-10-CM | POA: Diagnosis not present

## 2021-01-10 DIAGNOSIS — H524 Presbyopia: Secondary | ICD-10-CM | POA: Diagnosis not present

## 2021-01-10 DIAGNOSIS — H353132 Nonexudative age-related macular degeneration, bilateral, intermediate dry stage: Secondary | ICD-10-CM | POA: Diagnosis not present

## 2021-02-07 ENCOUNTER — Telehealth: Payer: Self-pay

## 2021-02-07 NOTE — Telephone Encounter (Signed)
Volunteer called patient on behalf of Palliative Care and did not get a answer from patient/family. ° °

## 2021-03-01 ENCOUNTER — Telehealth: Payer: Self-pay

## 2021-03-01 NOTE — Telephone Encounter (Signed)
Type of forms received:HANDICAP PLACCARD   Routed to: [facility]  Paperwork received by Trecia Rogers requesting form]:  LINDA [facility] DAUGHTER OF PATIENT   Individual made aware of 3-5 business day turn around (Y/N): Y  Faxed to :   Form location:

## 2021-03-03 NOTE — Telephone Encounter (Signed)
Since PCP will not be in the office for a while, will route to [place], NP. Form on your desk for review.   FYI daughter would like it mailed back

## 2021-03-03 NOTE — Telephone Encounter (Signed)
Completed and placed on Shapale's desk.

## 2021-03-06 NOTE — Telephone Encounter (Signed)
Spoke with daughter and she would like form mailed. Done and she is aware.

## 2021-03-09 ENCOUNTER — Other Ambulatory Visit: Payer: Self-pay | Admitting: Family Medicine

## 2021-03-14 DIAGNOSIS — L814 Other melanin hyperpigmentation: Secondary | ICD-10-CM | POA: Diagnosis not present

## 2021-03-14 DIAGNOSIS — L570 Actinic keratosis: Secondary | ICD-10-CM | POA: Diagnosis not present

## 2021-03-14 DIAGNOSIS — D2262 Melanocytic nevi of left upper limb, including shoulder: Secondary | ICD-10-CM | POA: Diagnosis not present

## 2021-03-14 DIAGNOSIS — D225 Melanocytic nevi of trunk: Secondary | ICD-10-CM | POA: Diagnosis not present

## 2021-03-14 DIAGNOSIS — B351 Tinea unguium: Secondary | ICD-10-CM | POA: Diagnosis not present

## 2021-03-14 DIAGNOSIS — Z85828 Personal history of other malignant neoplasm of skin: Secondary | ICD-10-CM | POA: Diagnosis not present

## 2021-03-14 DIAGNOSIS — L821 Other seborrheic keratosis: Secondary | ICD-10-CM | POA: Diagnosis not present

## 2021-03-14 DIAGNOSIS — L720 Epidermal cyst: Secondary | ICD-10-CM | POA: Diagnosis not present

## 2021-03-14 DIAGNOSIS — L738 Other specified follicular disorders: Secondary | ICD-10-CM | POA: Diagnosis not present

## 2021-03-14 DIAGNOSIS — D1801 Hemangioma of skin and subcutaneous tissue: Secondary | ICD-10-CM | POA: Diagnosis not present

## 2021-03-20 ENCOUNTER — Other Ambulatory Visit: Payer: Self-pay

## 2021-03-20 ENCOUNTER — Telehealth: Payer: Self-pay

## 2021-03-20 ENCOUNTER — Other Ambulatory Visit: Payer: Medicare Other | Admitting: Adult Health Nurse Practitioner

## 2021-03-20 DIAGNOSIS — M1712 Unilateral primary osteoarthritis, left knee: Secondary | ICD-10-CM | POA: Diagnosis not present

## 2021-03-20 DIAGNOSIS — F0390 Unspecified dementia without behavioral disturbance: Secondary | ICD-10-CM | POA: Diagnosis not present

## 2021-03-20 DIAGNOSIS — R110 Nausea: Secondary | ICD-10-CM

## 2021-03-20 DIAGNOSIS — R1310 Dysphagia, unspecified: Secondary | ICD-10-CM | POA: Diagnosis not present

## 2021-03-20 DIAGNOSIS — Z515 Encounter for palliative care: Secondary | ICD-10-CM | POA: Diagnosis not present

## 2021-03-20 NOTE — Progress Notes (Signed)
[facility] Palliative Care Consult Note Telephone: [phone]  Fax: [phone]  PATIENT NAME: Ashlee Reyes DOB: 1921/07/21 MRN: [id number]  PRIMARY CARE PROVIDER:   Abner Greenspan, MD  REFERRING PROVIDER:  Abner Greenspan, MD [address] [place],  [place] [address]  RESPONSIBLE PARTY:   [name], daughter [phone]  Chief complaint:  Follow up palliative visit/nausea   RECOMMENDATIONS and PLAN: 1.Advanced care planning. Patient is a DNR  2.  Dementia. Patient having slow decline but overall no significant decline.  Though slower functional status unchanged.  3.  Nausea.  Patient having increased nausea and using zofran about 1-2 times a day but not needing it everyday.  Does get relief with the zofran. She continues to eat well and has no reported weight loss.   This is being managed by PCP.    4.  Arthritis.  Patient having increased arthritis pain in left hand and left knee.  She does get relief with Tylenol PRN.  Have encouraged to schedule Tylenol 500 mg in the AM and at bedtime for more consistent pain relief.  Will continue to monitor  5.  Difficulty swallowing liquids.  Will but in home health ST referral with help of RN navigator for increased episodes of choking with liquids.  No reported falls, infections, or hospital visits since last visit. Palliative will continue to monitor for symptom management/decline and make recommendations as needed.  Follow up in 3 months.  Encouraged to call with any questions or concerns.  HISTORY OF PRESENT ILLNESS:  Ashlee Reyes is a 87 y.o. year old female with multiple medical problems including dementia,vitamin B12 deficiency,HTN, IBS,OA of left knee, lumbar spinal stenosis, h/o skin cancer. Palliative Care was asked to help address goals of care. Patient voices no new concerns but HPI/ROS unreliable secondary to dementia.  Daughter states that her mother has  increased nausea and has been taking her zofran 1-2 times per day when she needs it.  Her appetite is good with no reported weight loss.  Weight stable in the 180s. Patient has been having 2-3 episodes a day of choking with liquids.  She does not use straws.  Daughter states that her mother is moving slower but no decline in functional status.  She still needs helps with ADLs and is able to go to bathroom on her own and does have occasional urinary incontinence.  Denies headaches, dizziness, chest pain, cough, sore throat, fever, diarrhea, constipation, dysuria, hematuria.  CODE STATUS: DNR  PPS: 50% HOSPICE ELIGIBILITY/DIAGNOSIS: TBD  PHYSICAL EXAM:  BP 140/82 HR 74 O2 95% on RA weight 180.6 General: NAD, frail appearing Eyes: sclera anicteric and noninjected with no discharge noted ENMT: moist mucous membranes Cardiovascular: regular rate and rhythm Pulmonary:lung sounds clear; normal respiratory effort Abdomen: soft, nontender, + bowel sounds GU: no suprapubic tenderness Extremities:traceedema, no joint deformities Skin: no rasheson exposed skin Neurological: Weakness; A&O to person and place  PAST MEDICAL HISTORY:  Past Medical History:  Diagnosis Date  . AAA (abdominal aortic aneurysm) ([facility])    followed by Dr Oneida Alar. Last u/s07/10 stable AAA with largest measurement 3.97 x 4.02cm.  . Allergic rhinitis, seasonal   . Asthma   . Atrial fibrillation ([facility])   . CAD (coronary artery disease) 07/2009   mod by cath 08/10  . Complication of anesthesia   . Depression   . Disc disease, degenerative, lumbar or lumbosacral    SPINAL STENOSIS  . Diverticulosis   .  GERD (gastroesophageal reflux disease)   . Hemorrhoids   . History of recurrent UTIs   . Hyperlipidemia   . IBS (irritable bowel syndrome)   . IC (irritable colon)    DMSO in past  . Interstitial cystitis   . Osteoarthritis    spine/spinal stenosis  . Osteopenia 2011  . PONV (postoperative nausea and vomiting)   .  Skin cancer    basal cell cancer - face.    SOCIAL HX:  Social History   Tobacco Use  . Smoking status: Never Smoker  . Smokeless tobacco: Never Used  Substance Use Topics  . Alcohol use: No    Alcohol/week: 0.0 standard drinks    ALLERGIES:  Allergies  Allergen Reactions  . Amoxicillin-Pot Clavulanate Nausea And Vomiting    Has patient had a PCN reaction causing immediate rash, facial/tongue/throat swelling, SOB or lightheadedness with hypotension: No Has patient had a PCN reaction causing severe rash involving mucus membranes or skin necrosis: No Has patient had a PCN reaction that required hospitalization: No Has patient had a PCN reaction occurring within the last 10 years: No If all of the above answers are "NO", then may proceed with Cephalosporin use.   . Avelox [Moxifloxacin] Nausea And Vomiting  . Flovent Hfa [Fluticasone] Other (See Comments)    "makes her feel funny" per family member  . Ibuprofen Other (See Comments)    GI upset  . Keflex [Cephalexin] Other (See Comments)    abd pain / GI upset  . Nitrofurantoin Other (See Comments)    Chest pain or indigestion with nausea  . Omeprazole Nausea Only  . Prevnar [Pneumococcal 13-Val Conj Vacc] Itching    Severe local reaction with redness and itching   . Rosuvastatin Other (See Comments)    Foot pain  . Sertraline Hcl Other (See Comments)    Made her more depressed  . Tetanus Toxoids     Local redness and swelling      PERTINENT MEDICATIONS:  Outpatient Encounter Medications as of 03/20/2021  Medication Sig  . acetaminophen (TYLENOL) 500 MG tablet Take 1 tablet (500 mg total) by mouth every 4 (four) hours as needed for mild pain.  Marland Kitchen albuterol (VENTOLIN HFA) 108 (90 Base) MCG/ACT inhaler INHALE 2 PUFFS BY MOUTH EVERY 4 HOURS AS NEEDED FOR WHEEZING (Patient not taking: Reported on 09/08/2020)  . amLODipine (NORVASC) 2.5 MG tablet Take one pill by mouth daily as needed for elevated blood pressure (over 145/90)  .  beclomethasone (QVAR REDIHALER) 80 MCG/ACT inhaler Inhale 2 puffs into the lungs 2 (two) times daily.  . cetirizine (ZYRTEC) 10 MG tablet TAKE 1 TABLET(10 MG) BY MOUTH DAILY AS NEEDED  . clopidogrel (PLAVIX) 75 MG tablet TAKE 1 TABLET(75 MG) BY MOUTH DAILY  . COVID-19 mRNA vaccine, Pfizer, 30 MCG/0.3ML injection USE AS DIRECTED  . docusate sodium (COLACE) 100 MG capsule Take 1 capsule (100 mg total) by mouth every evening.  Marland Kitchen esomeprazole (NEXIUM) 40 MG capsule TAKE 1 CAPSULE(40 MG) BY MOUTH TWICE DAILY BEFORE A MEAL  . famotidine (PEPCID) 40 MG tablet Take 1 tablet (40 mg total) by mouth daily.  . fluticasone (FLONASE) 50 MCG/ACT nasal spray Place 2 sprays into both nostrils daily.  Marland Kitchen LUMIGAN 0.01 % SOLN Place 1 drop into both eyes at bedtime.  . ondansetron (ZOFRAN-ODT) 4 MG disintegrating tablet DISSOLVE 1 TABLET(4 MG) ON THE TONGUE EVERY 8 HOURS AS NEEDED FOR NAUSEA OR VOMITING  . Polyethyl Glycol-Propyl Glycol (SYSTANE OP) Place 1 drop into  both eyes every 8 (eight) hours as needed (dry eyes).   . polyethylene glycol (MIRALAX / GLYCOLAX) packet Take 17 g by mouth daily as needed for mild constipation.  . Probiotic Product (ALIGN) 4 MG CAPS Take 1 capsule (4 mg total) by mouth daily.  . traMADol (ULTRAM) 50 MG tablet TAKE 1/2 TABLET BY MOUTH EVERY MORNING AND THEN TAKE 1 TABLET AT BEDTIME  . trimethoprim (TRIMPEX) 100 MG tablet Take 1 tablet (100 mg total) by mouth daily with lunch.   No facility-administered encounter medications on file as of 03/20/2021.     Torrey Ballinas Jenetta Downer, NP

## 2021-03-20 NOTE — Telephone Encounter (Signed)
At request of Amy NP, contacted [facility] with a referral for speech therapy for worsening dysphagia. Patient is now is coughing more when she is drinking thin liquids.

## 2021-03-21 ENCOUNTER — Telehealth: Payer: Self-pay

## 2021-03-21 DIAGNOSIS — R1310 Dysphagia, unspecified: Secondary | ICD-10-CM | POA: Insufficient documentation

## 2021-03-21 DIAGNOSIS — R1312 Dysphagia, oropharyngeal phase: Secondary | ICD-10-CM

## 2021-03-21 NOTE — Telephone Encounter (Signed)
Dr Glori Bickers, received message from [place] with [facility] asking for orders please see below. Thank you "Phillip Heal!   Hope you are well! Inez Catalina with [facility] has asked if we could see Ashlee Reyes for speech therapy due to dysphagia and increased coughing with liquids. Can you get Dr. Glori Bickers to place a home health order please? I appreciate you! "

## 2021-03-21 NOTE — Telephone Encounter (Signed)
Referral sent to [facility].

## 2021-03-22 ENCOUNTER — Telehealth: Payer: Self-pay

## 2021-03-22 NOTE — Telephone Encounter (Signed)
Anderson Malta, ST with [facility] called requesting verbal orders for the start of care date be on 03/23/21 instead of 03/22/21 D/T patients caregiver request.

## 2021-03-22 NOTE — Telephone Encounter (Signed)
Verbal order given to Anderson Malta to change the start date

## 2021-03-22 NOTE — Telephone Encounter (Signed)
That is fine, please ok it

## 2021-03-23 DIAGNOSIS — M48061 Spinal stenosis, lumbar region without neurogenic claudication: Secondary | ICD-10-CM | POA: Diagnosis not present

## 2021-03-23 DIAGNOSIS — Z7951 Long term (current) use of inhaled steroids: Secondary | ICD-10-CM | POA: Diagnosis not present

## 2021-03-23 DIAGNOSIS — Z79891 Long term (current) use of opiate analgesic: Secondary | ICD-10-CM | POA: Diagnosis not present

## 2021-03-23 DIAGNOSIS — M8580 Other specified disorders of bone density and structure, unspecified site: Secondary | ICD-10-CM | POA: Diagnosis not present

## 2021-03-23 DIAGNOSIS — Z85828 Personal history of other malignant neoplasm of skin: Secondary | ICD-10-CM | POA: Diagnosis not present

## 2021-03-23 DIAGNOSIS — K5790 Diverticulosis of intestine, part unspecified, without perforation or abscess without bleeding: Secondary | ICD-10-CM | POA: Diagnosis not present

## 2021-03-23 DIAGNOSIS — R110 Nausea: Secondary | ICD-10-CM | POA: Diagnosis not present

## 2021-03-23 DIAGNOSIS — R32 Unspecified urinary incontinence: Secondary | ICD-10-CM | POA: Diagnosis not present

## 2021-03-23 DIAGNOSIS — M47816 Spondylosis without myelopathy or radiculopathy, lumbar region: Secondary | ICD-10-CM | POA: Diagnosis not present

## 2021-03-23 DIAGNOSIS — I4891 Unspecified atrial fibrillation: Secondary | ICD-10-CM | POA: Diagnosis not present

## 2021-03-23 DIAGNOSIS — M150 Primary generalized (osteo)arthritis: Secondary | ICD-10-CM | POA: Diagnosis not present

## 2021-03-23 DIAGNOSIS — M5136 Other intervertebral disc degeneration, lumbar region: Secondary | ICD-10-CM | POA: Diagnosis not present

## 2021-03-23 DIAGNOSIS — F0280 Dementia in other diseases classified elsewhere without behavioral disturbance: Secondary | ICD-10-CM | POA: Diagnosis not present

## 2021-03-23 DIAGNOSIS — J45909 Unspecified asthma, uncomplicated: Secondary | ICD-10-CM | POA: Diagnosis not present

## 2021-03-23 DIAGNOSIS — Z7902 Long term (current) use of antithrombotics/antiplatelets: Secondary | ICD-10-CM | POA: Diagnosis not present

## 2021-03-23 DIAGNOSIS — I714 Abdominal aortic aneurysm, without rupture: Secondary | ICD-10-CM | POA: Diagnosis not present

## 2021-03-23 DIAGNOSIS — Z87440 Personal history of urinary (tract) infections: Secondary | ICD-10-CM | POA: Diagnosis not present

## 2021-03-23 DIAGNOSIS — I2510 Atherosclerotic heart disease of native coronary artery without angina pectoris: Secondary | ICD-10-CM | POA: Diagnosis not present

## 2021-03-23 DIAGNOSIS — R1310 Dysphagia, unspecified: Secondary | ICD-10-CM | POA: Diagnosis not present

## 2021-03-23 DIAGNOSIS — K219 Gastro-esophageal reflux disease without esophagitis: Secondary | ICD-10-CM | POA: Diagnosis not present

## 2021-03-23 DIAGNOSIS — I10 Essential (primary) hypertension: Secondary | ICD-10-CM | POA: Diagnosis not present

## 2021-03-23 DIAGNOSIS — F32A Depression, unspecified: Secondary | ICD-10-CM | POA: Diagnosis not present

## 2021-03-23 DIAGNOSIS — E785 Hyperlipidemia, unspecified: Secondary | ICD-10-CM | POA: Diagnosis not present

## 2021-03-23 DIAGNOSIS — E538 Deficiency of other specified B group vitamins: Secondary | ICD-10-CM | POA: Diagnosis not present

## 2021-03-23 DIAGNOSIS — K589 Irritable bowel syndrome without diarrhea: Secondary | ICD-10-CM | POA: Diagnosis not present

## 2021-03-24 ENCOUNTER — Telehealth: Payer: Self-pay

## 2021-03-24 NOTE — Telephone Encounter (Signed)
Called and they close at 5pm, will try back Monday

## 2021-03-24 NOTE — Telephone Encounter (Signed)
Yes, both D and B12

## 2021-03-24 NOTE — Telephone Encounter (Signed)
Please ok those verbal orders  

## 2021-03-24 NOTE — Telephone Encounter (Signed)
Tiffany nurse with [facility] said the pt was opened to [facility] PT and the PT was seeing pt today and the pt has Vit b 12; 500 mcg taking one daily and also Vit D 3 taking one daily. Tiffany said these 2 meds are not on med list and should pt be taking them. Also pt ventolin is on med list but could not find med at pts home. For the Ventolin Tiffany will contact Dr Allena Katz. Tiffany was not at pts home today the PT was and Jonelle Sidle is not sure if [name] was there or not. Tiffany request cb if pt should be taking the Vit B 12 and Vit D 3. Sending note to Dr Glori Bickers.

## 2021-03-24 NOTE — Telephone Encounter (Signed)
Anderson Malta, ST with [facility] called requesting verbal orders for 1 week X 1 and 2 week X 3. Please advise.

## 2021-03-24 NOTE — Telephone Encounter (Signed)
Verbal order given to [facility]

## 2021-03-27 NOTE — Telephone Encounter (Signed)
Tiffany advised of Dr. Marliss Coots comments

## 2021-03-28 DIAGNOSIS — E538 Deficiency of other specified B group vitamins: Secondary | ICD-10-CM | POA: Diagnosis not present

## 2021-03-28 DIAGNOSIS — R1310 Dysphagia, unspecified: Secondary | ICD-10-CM | POA: Diagnosis not present

## 2021-03-28 DIAGNOSIS — I2510 Atherosclerotic heart disease of native coronary artery without angina pectoris: Secondary | ICD-10-CM | POA: Diagnosis not present

## 2021-03-28 DIAGNOSIS — F0280 Dementia in other diseases classified elsewhere without behavioral disturbance: Secondary | ICD-10-CM | POA: Diagnosis not present

## 2021-03-28 DIAGNOSIS — R110 Nausea: Secondary | ICD-10-CM | POA: Diagnosis not present

## 2021-03-28 DIAGNOSIS — I10 Essential (primary) hypertension: Secondary | ICD-10-CM | POA: Diagnosis not present

## 2021-03-30 DIAGNOSIS — R1310 Dysphagia, unspecified: Secondary | ICD-10-CM | POA: Diagnosis not present

## 2021-03-30 DIAGNOSIS — F0280 Dementia in other diseases classified elsewhere without behavioral disturbance: Secondary | ICD-10-CM | POA: Diagnosis not present

## 2021-03-30 DIAGNOSIS — I2510 Atherosclerotic heart disease of native coronary artery without angina pectoris: Secondary | ICD-10-CM | POA: Diagnosis not present

## 2021-03-30 DIAGNOSIS — E538 Deficiency of other specified B group vitamins: Secondary | ICD-10-CM | POA: Diagnosis not present

## 2021-03-30 DIAGNOSIS — R110 Nausea: Secondary | ICD-10-CM | POA: Diagnosis not present

## 2021-03-30 DIAGNOSIS — I10 Essential (primary) hypertension: Secondary | ICD-10-CM | POA: Diagnosis not present

## 2021-03-31 DIAGNOSIS — R110 Nausea: Secondary | ICD-10-CM | POA: Diagnosis not present

## 2021-03-31 DIAGNOSIS — I10 Essential (primary) hypertension: Secondary | ICD-10-CM | POA: Diagnosis not present

## 2021-03-31 DIAGNOSIS — R1310 Dysphagia, unspecified: Secondary | ICD-10-CM | POA: Diagnosis not present

## 2021-03-31 DIAGNOSIS — I2510 Atherosclerotic heart disease of native coronary artery without angina pectoris: Secondary | ICD-10-CM | POA: Diagnosis not present

## 2021-03-31 DIAGNOSIS — F0280 Dementia in other diseases classified elsewhere without behavioral disturbance: Secondary | ICD-10-CM | POA: Diagnosis not present

## 2021-03-31 DIAGNOSIS — E538 Deficiency of other specified B group vitamins: Secondary | ICD-10-CM | POA: Diagnosis not present

## 2021-04-05 DIAGNOSIS — I10 Essential (primary) hypertension: Secondary | ICD-10-CM | POA: Diagnosis not present

## 2021-04-05 DIAGNOSIS — R1310 Dysphagia, unspecified: Secondary | ICD-10-CM | POA: Diagnosis not present

## 2021-04-05 DIAGNOSIS — F0280 Dementia in other diseases classified elsewhere without behavioral disturbance: Secondary | ICD-10-CM | POA: Diagnosis not present

## 2021-04-05 DIAGNOSIS — E538 Deficiency of other specified B group vitamins: Secondary | ICD-10-CM | POA: Diagnosis not present

## 2021-04-05 DIAGNOSIS — I2510 Atherosclerotic heart disease of native coronary artery without angina pectoris: Secondary | ICD-10-CM | POA: Diagnosis not present

## 2021-04-05 DIAGNOSIS — R110 Nausea: Secondary | ICD-10-CM | POA: Diagnosis not present

## 2021-04-06 DIAGNOSIS — F0280 Dementia in other diseases classified elsewhere without behavioral disturbance: Secondary | ICD-10-CM | POA: Diagnosis not present

## 2021-04-06 DIAGNOSIS — R1310 Dysphagia, unspecified: Secondary | ICD-10-CM | POA: Diagnosis not present

## 2021-04-06 DIAGNOSIS — I2510 Atherosclerotic heart disease of native coronary artery without angina pectoris: Secondary | ICD-10-CM | POA: Diagnosis not present

## 2021-04-06 DIAGNOSIS — R110 Nausea: Secondary | ICD-10-CM | POA: Diagnosis not present

## 2021-04-06 DIAGNOSIS — E538 Deficiency of other specified B group vitamins: Secondary | ICD-10-CM | POA: Diagnosis not present

## 2021-04-06 DIAGNOSIS — I10 Essential (primary) hypertension: Secondary | ICD-10-CM | POA: Diagnosis not present

## 2021-05-10 DIAGNOSIS — H04123 Dry eye syndrome of bilateral lacrimal glands: Secondary | ICD-10-CM | POA: Diagnosis not present

## 2021-05-10 DIAGNOSIS — H401133 Primary open-angle glaucoma, bilateral, severe stage: Secondary | ICD-10-CM | POA: Diagnosis not present

## 2021-05-30 ENCOUNTER — Encounter: Payer: Self-pay | Admitting: Allergy and Immunology

## 2021-05-30 ENCOUNTER — Ambulatory Visit (INDEPENDENT_AMBULATORY_CARE_PROVIDER_SITE_OTHER): Payer: Medicare Other | Admitting: Allergy and Immunology

## 2021-05-30 ENCOUNTER — Other Ambulatory Visit: Payer: Self-pay

## 2021-05-30 VITALS — BP 138/66 | HR 82 | Temp 98.0°F | Resp 14 | Ht 66.0 in | Wt 180.0 lb

## 2021-05-30 DIAGNOSIS — K219 Gastro-esophageal reflux disease without esophagitis: Secondary | ICD-10-CM

## 2021-05-30 DIAGNOSIS — Z8709 Personal history of other diseases of the respiratory system: Secondary | ICD-10-CM

## 2021-05-30 DIAGNOSIS — J3089 Other allergic rhinitis: Secondary | ICD-10-CM | POA: Diagnosis not present

## 2021-05-30 MED ORDER — ALBUTEROL SULFATE HFA 108 (90 BASE) MCG/ACT IN AERS: INHALATION_SPRAY | RESPIRATORY_TRACT | 1 refills | Status: AC

## 2021-05-30 MED ORDER — FAMOTIDINE 40 MG PO TABS: 40.0000 mg | ORAL_TABLET | Freq: Every day | ORAL | 2 refills | Status: DC

## 2021-05-30 MED ORDER — CETIRIZINE HCL 10 MG PO TABS
ORAL_TABLET | ORAL | 11 refills | Status: AC
Start: 2021-05-30 — End: ?

## 2021-05-30 NOTE — Progress Notes (Signed)
- [place] [facility] [place]   Follow-up Note  Referring Provider: Tower, Wynelle Fanny, MD Primary Provider: Abner Greenspan, MD Date of Office Visit: 05/30/2021  Subjective:   Ashlee Reyes (DOB: 1921/04/16) is a 87 y.o. female who returns to the [facility] on 05/30/2021 in re-evaluation of the following:  HPI: Ashlee Reyes returns to this clinic in evaluation of rhinitis and history of asthma and history of reflux induced respiratory disease.  Her last visit to this clinic was 24 July 2020.  She arrives today with her daughter.  Ashlee Reyes has really lost a fair amount of memory and most of the history was extracted from her daughter.  Overall she has really done very well for the most part.  She has not been having much reflux issue but occasionally has some choking when eating and swallowing.  She is not losing any weight.  She has not been having any cough.  She does not use any albuterol.    Her nose has not really been causing her much of a problem. She rarely uses any Flonase  She has received 3 [place] vaccines.  Allergies as of 05/30/2021       Reactions   Amoxicillin-pot Clavulanate Nausea And Vomiting   Has patient had a PCN reaction causing immediate rash, facial/tongue/throat swelling, SOB or lightheadedness with hypotension: No Has patient had a PCN reaction causing severe rash involving mucus membranes or skin necrosis: No Has patient had a PCN reaction that required hospitalization: No Has patient had a PCN reaction occurring within the last 10 years: No If all of the above answers are "NO", then may proceed with Cephalosporin use.   Avelox [moxifloxacin] Nausea And Vomiting   Flovent Hfa [fluticasone] Other (See Comments)   "makes her feel funny" per family member   Ibuprofen Other (See Comments)   GI upset   Keflex [cephalexin] Other (See Comments)   abd pain / GI upset   Nitrofurantoin Other (See Comments)    Chest pain or indigestion with nausea   Omeprazole Nausea Only   Prevnar [pneumococcal 13-val Conj Vacc] Itching   Severe local reaction with redness and itching    Rosuvastatin Other (See Comments)   Foot pain   Sertraline Hcl Other (See Comments)   Made her more depressed   Tetanus Toxoids    Local redness and swelling         Medication List      acetaminophen 500 MG tablet Commonly known as: TYLENOL Take 1 tablet (500 mg total) by mouth every 4 (four) hours as needed for mild pain.   Align 4 MG Caps Take 1 capsule (4 mg total) by mouth daily.   amLODipine 2.5 MG tablet Commonly known as: NORVASC Take one pill by mouth daily as needed for elevated blood pressure (over 145/90)   cetirizine 10 MG tablet Commonly known as: ZYRTEC TAKE 1 TABLET(10 MG) BY MOUTH DAILY AS NEEDED   clopidogrel 75 MG tablet Commonly known as: PLAVIX TAKE 1 TABLET(75 MG) BY MOUTH DAILY   docusate sodium 100 MG capsule Commonly known as: COLACE Take 1 capsule (100 mg total) by mouth every evening.   esomeprazole 40 MG capsule Commonly known as: NEXIUM TAKE 1 CAPSULE(40 MG) BY MOUTH TWICE DAILY BEFORE A MEAL   famotidine 40 MG tablet Commonly known as: PEPCID Take 1 tablet (40 mg total) by mouth daily.   fluticasone 50 MCG/ACT nasal spray Commonly known as:  Flonase Place 2 sprays into both nostrils daily.   Lumigan 0.01 % Soln Generic drug: bimatoprost Place 1 drop into both eyes at bedtime.   ondansetron 4 MG disintegrating tablet Commonly known as: ZOFRAN-ODT DISSOLVE 1 TABLET(4 MG) ON THE TONGUE EVERY 8 HOURS AS NEEDED FOR NAUSEA OR VOMITING   Pfizer-BioNTech COVID-19 Vacc 30 MCG/0.3ML injection Generic drug: COVID-19 mRNA vaccine (Pfizer) USE AS DIRECTED   polyethylene glycol 17 g packet Commonly known as: MIRALAX / GLYCOLAX Take 17 g by mouth daily as needed for mild constipation.   SYSTANE OP Place 1 drop into both eyes every 8 (eight) hours as needed (dry  eyes).   traMADol 50 MG tablet Commonly known as: ULTRAM TAKE 1/2 TABLET BY MOUTH EVERY MORNING AND THEN TAKE 1 TABLET AT BEDTIME   trimethoprim 100 MG tablet Commonly known as: TRIMPEX Take 1 tablet (100 mg total) by mouth daily with lunch.   Vitamin D3 25 MCG (1000 UT) Caps Take by mouth.        Past Medical History:  Diagnosis Date   AAA (abdominal aortic aneurysm) ([place])    followed by Dr Oneida Alar. Last u/s07/10 stable AAA with largest measurement 3.97 x 4.02cm.   Allergic rhinitis, seasonal    Asthma    Atrial fibrillation ([facility])    CAD (coronary artery disease) 07/2009   mod by cath 95/62   Complication of anesthesia    Depression    Disc disease, degenerative, lumbar or lumbosacral    SPINAL STENOSIS   Diverticulosis    GERD (gastroesophageal reflux disease)    Hemorrhoids    History of recurrent UTIs    Hyperlipidemia    IBS (irritable bowel syndrome)    IC (irritable colon)    DMSO in past   Interstitial cystitis    Osteoarthritis    spine/spinal stenosis   Osteopenia 2011   PONV (postoperative nausea and vomiting)    Skin cancer    basal cell cancer - face.    Past Surgical History:  Procedure Laterality Date   ABDOMINAL AORTIC ENDOVASCULAR STENT GRAFT N/A 07/01/2013   Procedure: ABDOMINAL AORTIC ENDOVASCULAR STENT GRAFT- GORE;  Surgeon: Elam Dutch, MD;  Location: [facility];  Service: Vascular;  Laterality: N/A;  Ultrasound guided   ABDOMINAL HYSTERECTOMY     partial not cancer   BREAST SURGERY     lumpectomy   CYSTOCELE REPAIR     ESOPHAGOGASTRODUODENOSCOPY  09/1998   erosive esphagitis ? stricture treated 12/2002   HERNIA REPAIR  13/0865   umbilical hernia   [place]     for film after cataract surgery.   RIGHT COLECTOMY  1997   benign   SKIN LESION EXCISION  05/2011   basal cell skin lesion removed.    Review of systems negative except as noted in HPI / PMHx or noted below:  Review of Systems   Constitutional: Negative.   HENT: Negative.    Eyes: Negative.   Respiratory: Negative.    Cardiovascular: Negative.   Gastrointestinal: Negative.   Genitourinary: Negative.   Musculoskeletal: Negative.   Skin: Negative.   Neurological: Negative.   Endo/Heme/Allergies: Negative.   Psychiatric/Behavioral: Negative.      Objective:   Vitals:   05/30/21 1038  BP: 138/66  Pulse: 82  Resp: 14  Temp: 98 F (36.7 C)  SpO2: 94%   Height: 5\' 6"  (167.6 cm)  Weight: 180 lb (81.6 kg)   Physical Exam Constitutional:  Appearance: She is not diaphoretic.     Comments: Wheelchair  HENT:     Head: Normocephalic.     Right Ear: Tympanic membrane, ear canal and external ear normal.     Left Ear: Tympanic membrane, ear canal and external ear normal.     Nose: Nose normal. No mucosal edema or rhinorrhea.     Mouth/Throat:     Pharynx: Uvula midline. No oropharyngeal exudate.  Eyes:     Conjunctiva/sclera: Conjunctivae normal.  Neck:     Thyroid: No thyromegaly.     Trachea: Trachea normal. No tracheal tenderness or tracheal deviation.  Cardiovascular:     Rate and Rhythm: Normal rate and regular rhythm.     Heart sounds: Normal heart sounds, S1 normal and S2 normal. No murmur heard. Pulmonary:     Effort: No respiratory distress.     Breath sounds: Normal breath sounds. No stridor. No wheezing or rales.  Lymphadenopathy:     Head:     Right side of head: No tonsillar adenopathy.     Left side of head: No tonsillar adenopathy.     Cervical: No cervical adenopathy.  Skin:    Findings: No erythema or rash.     Nails: There is no clubbing.  Neurological:     Mental Status: She is alert.    Diagnostics: none  Assessment and Plan:   1. Other allergic rhinitis   2. History of asthma   3. LPRD (laryngopharyngeal reflux disease)     1. Continue Nexium 40 - 1 tab in AM + Famotidine 40mg  - 1 tab in PM  2. If needed:    A. ProAir HFA or similar 2 inhalations every 4-6  hours  B. Mucinex DM 1-2 tablets twic a day  C. Nasal saline multiple times per day  D. Cetirizine 10mg  - 1 tablet one time per day  E. Flonase - 1 spray each nostril 3-7 times per week  3.  Return in 1 year or earlier if problem  Ashlee Reyes appears to be doing very well given the circumstance of her 87 years of age and her memory issue while she continues to use therapy directed against reflux for the most part.  She has several medications she can use if they are required.  I will see her back in this clinic in 1 year or earlier if there is a problem.    Allena Katz, MD Allergy / Immunology [place]

## 2021-05-30 NOTE — Patient Instructions (Addendum)
  1. Continue Nexium 40 - 1 tab in AM + Famotidine 40mg  - 1 tab in PM  2. If needed:    A. ProAir HFA or similar 2 inhalations every 4-6 hours  B. Mucinex DM 1-2 tablets twic a day  C. Nasal saline multiple times per day  D. Cetirizine 10mg  - 1 tablet one time per day  E. Flonase - 1 spray each nostril 3-7 times per week  3. Return in 1 year or earlier if problem

## 2021-05-31 ENCOUNTER — Encounter: Payer: Self-pay | Admitting: Allergy and Immunology

## 2021-06-07 ENCOUNTER — Telehealth: Payer: Self-pay | Admitting: Adult Health Nurse Practitioner

## 2021-06-07 NOTE — Telephone Encounter (Signed)
Spoke with daughter and confirmed appointment for 06/12/21 @ 11am Aarilyn Dye K. Olena Heckle NP

## 2021-06-12 ENCOUNTER — Encounter: Payer: Self-pay | Admitting: Adult Health Nurse Practitioner

## 2021-06-12 ENCOUNTER — Other Ambulatory Visit: Payer: Medicare Other | Admitting: Adult Health Nurse Practitioner

## 2021-06-12 ENCOUNTER — Other Ambulatory Visit: Payer: Self-pay

## 2021-06-12 VITALS — BP 141/84 | HR 73 | Temp 96.7°F | Wt 181.2 lb

## 2021-06-12 DIAGNOSIS — R609 Edema, unspecified: Secondary | ICD-10-CM

## 2021-06-12 DIAGNOSIS — F0390 Unspecified dementia without behavioral disturbance: Secondary | ICD-10-CM | POA: Diagnosis not present

## 2021-06-12 DIAGNOSIS — Z515 Encounter for palliative care: Secondary | ICD-10-CM | POA: Diagnosis not present

## 2021-06-12 DIAGNOSIS — R1310 Dysphagia, unspecified: Secondary | ICD-10-CM | POA: Diagnosis not present

## 2021-06-12 NOTE — Progress Notes (Signed)
Therapist, nutritional Palliative Care Consult Note Telephone: [phone]  Fax: [phone]    Date of encounter: 06/12/21 PATIENT NAME: Ashlee Reyes [address] [place] [address]   [phone] (home)  DOB: 02/24/1921 MRN: [id number] PRIMARY CARE PROVIDER:    Judy Pimple, MD,  [address] [place] [address] [phone]  REFERRING PROVIDER:   Judy Pimple, MD [address] [place],  [place] [address] [phone]  RESPONSIBLE PARTY:    Contact Information     Name Relation Home Work Edna Daughter [phone]    Kandise, Riehle [phone]   Jolina, Symonds Daughter [phone]   Dickson,Lorrie    [phone]   Phoebe Perch    [phone]        I met face to face with patient and family in home. Palliative Care was asked to follow this patient by consultation request of  Tower, Audrie Gallus, MD to address advance care planning and complex medical decision making. This is a follow up visit.  Daughter, Ashlee Reyes, present during visit today                                   ASSESSMENT AND PLAN / RECOMMENDATIONS:   Advance Care Planning/Goals of Care: Goals include to maximize quality of life and symptom management.  CODE STATUS:  DNR  Symptom Management/Plan:  Dementia:  She is having some increased forgetfulness and repeating herself more often.  Though slower, functional status mostly unchanged. Continue supportive care at home  Difficulty swallowing liquids:  has been evaluated by ST and encouraged to take sips slowly.  Daughter does have notes to help remind her.    Edema:  increased edema today but no increased SOB or cough.  Does get relief with propping.  Encouraged to prop her feet.  Daughter does state that she does have to be reminded to prop her feet and frequently does not want to prop them up.  Encourage frequent  reminding.   Follow up Palliative Care Visit: Palliative care will continue to follow for complex medical decision making, advance care planning, and clarification of goals. Return 12 weeks or prn.  Encouraged to call with any questions or concerns  I spent 45 minutes providing this consultation. More than 50% of the time in this consultation was spent in counseling and care coordination.  PPS: 50%  HOSPICE ELIGIBILITY/DIAGNOSIS: TBD  Chief Complaint: follow up palliative visit  HISTORY OF PRESENT ILLNESS:  Ashlee Reyes is a 87 y.o. year old female  with dementia, vitamin B12 deficiency, HTN, IBS, OA of left knee, lumbar spinal stenosis, h/o skin cancer .  Patient has had ST evaluation for difficulty swallowing liquids.  Daughter does state that she is recommended to take 1 sip at a time slowly and does have notes for reminders for her mom.  Patient is having increased edema today, however denies shortness of breath, cough, chest pain, orthopnea.  Daughter did state that yesterday her blood pressure was elevated at 150/101 and that her mother complained of feeling sleepy and tired.  She is feeling better today with blood pressure at 141/84 today.  Denies falls but daughter does state that she has had some near misses.  No infections or hospital visits since last visit.  Daughter does state that her mother is having more cognitive decline and is forgetting  how to load the dishwasher but can still make her banana and peanut butter sandwiches herself.  Rest of 10 point ROS asked and negative except what is stated in HPI  History obtained from review of EMR and interview with family and Ashlee Reyes.    PHYSICAL EXAM:   General: NAD, frail appearing Eyes: sclera anicteric and noninjected with no discharge noted ENMT: moist mucous membranes Cardiovascular: regular rate and rhythm Pulmonary: lung sounds clear; normal respiratory effort Abdomen: soft, nontender, + bowel sounds Extremities:  1-2 + edema with more to left than right, no joint deformities Skin: no rashes on exposed skin Neurological: Weakness; A&O to person and place   Thank you for the opportunity to participate in the care of Ashlee Reyes.  The palliative care team will continue to follow. Please call our office at [phone] if we can be of additional assistance.   Melaney Tellefsen Jenetta Downer, NP , DNP  This chart was dictated using voice recognition software.  Despite best efforts to proofread,  errors can occur which can change the documentation meaning.   COVID-19 PATIENT SCREENING TOOL Asked and negative response unless otherwise noted:   Have you had symptoms of covid, tested positive or been in contact with someone with symptoms/positive test in the past 5-10 days?

## 2021-06-28 ENCOUNTER — Other Ambulatory Visit: Payer: Self-pay | Admitting: Family Medicine

## 2021-07-09 ENCOUNTER — Other Ambulatory Visit: Payer: Self-pay | Admitting: Family Medicine

## 2021-07-10 NOTE — Telephone Encounter (Signed)
Name of Medication: Tramadol Name of Pharmacy: [facility] or Written Date and Quantity: 01/05/21 #45 tabs 3 refills Last Office Visit and Type: f/u BP/Heart rate on 09/08/20 Next Office Visit and Type: AWV/CPE on 08/10/21

## 2021-07-26 ENCOUNTER — Telehealth: Payer: Self-pay | Admitting: Family Medicine

## 2021-07-26 ENCOUNTER — Telehealth: Payer: Self-pay | Admitting: Student

## 2021-07-26 NOTE — Telephone Encounter (Signed)
Palliative NP returned call to daughter. Follow up palliative appointment scheduled for tomorrow to see patient sooner.

## 2021-07-26 NOTE — Telephone Encounter (Signed)
Anderson Malta from [facility] called requesting Info on orders . Would like a call back #[phone]

## 2021-07-26 NOTE — Telephone Encounter (Signed)
Left VM requesting what type of orders are they needing and to call back and update me

## 2021-07-27 ENCOUNTER — Other Ambulatory Visit: Payer: Medicare Other | Admitting: Student

## 2021-07-27 ENCOUNTER — Other Ambulatory Visit: Payer: Self-pay

## 2021-07-27 DIAGNOSIS — F0390 Unspecified dementia without behavioral disturbance: Secondary | ICD-10-CM | POA: Diagnosis not present

## 2021-07-27 DIAGNOSIS — Z515 Encounter for palliative care: Secondary | ICD-10-CM | POA: Diagnosis not present

## 2021-07-27 DIAGNOSIS — I10 Essential (primary) hypertension: Secondary | ICD-10-CM

## 2021-07-27 DIAGNOSIS — R110 Nausea: Secondary | ICD-10-CM

## 2021-07-27 NOTE — Progress Notes (Signed)
Therapist, nutritional Palliative Care Consult Note Telephone: [phone]  Fax: [phone]    Date of encounter: 07/27/21 PATIENT NAME: Ashlee Reyes [address] [place] [address]   [phone] (home)  DOB: [birth year] MRN: [id number] PRIMARY CARE PROVIDER:    Judy Pimple, MD,  [address] [place] [address] [phone]  REFERRING PROVIDER:   Judy Pimple, MD [address] [place],  [place] [address] [phone]  RESPONSIBLE PARTY:    Contact Information     Name Relation Home Work [facility] Daughter [phone]    Ebunoluwa, Gernert [phone]   Karrigan, Messamore Daughter [phone]   Dickson,Lorrie    [phone]   Phoebe Perch    [phone]        I met face to face with patient and family in the home. Palliative Care was asked to follow this patient by consultation request of  Tower, Audrie Gallus, MD to address advance care planning and complex medical decision making. This is a follow up visit.                                   ASSESSMENT AND PLAN / RECOMMENDATIONS:   Advance Care Planning/Goals of Care: Goals include to maximize quality of life and symptom management.   CODE STATUS: DNR  Symptom Management/Plan:  Hypertension-daughter continues to check blood pressure in the home. Systolic in the 130's-140's/diastolic 80-90's diastolic. Daughter to continue following parameter of administering amlodipine 2.5 mg daily PRN if b/p is 145/90 or greater. Monitor salt intake.   Dementia-patient has been stable; no changes in functional status. Family to continue supportive care; reorient/redirect as needed. Monitor for falls/safety.   Nausea-continue ondansetron PRN. We discussed potential causes for her nausea, monitor if patient is anxious, needs to have a bowel movement, food triggers.   Follow up Palliative Care Visit:  Palliative care will continue to follow for complex medical decision making, advance care planning, and clarification of goals. Return in 6-8 weeks or prn.  I spent 40 minutes providing this consultation. More than 50% of the time in this consultation was spent in counseling and care coordination.   PPS: 50%  HOSPICE ELIGIBILITY/DIAGNOSIS: TBD  Chief Complaint: Palliative Medicine follow up visit.   HISTORY OF PRESENT ILLNESS:  Ashlee Reyes is a 87 y.o. year old female  with dementia, hypertension, edema, dysphagia, OA, lumbar stenosis, IBS, hx of skin cancer.    Patient resides at home with her daughter. Daughter expresses concern over patient blood pressure, when it is borderline parameter of 145/90 and should she give the PRN amlodipine. Daughter will sometimes check multiple times a day. Blood pressure checks earlier today were 138/78 and 153/89 with wrist cuff. Patient denies any pain, shortness of breath, nausea, constipation. She endorses a good appetite. States she is sleeping well at night. No recent falls or injury. No recent ER visits or hospitalizations. A 10 point review of systems is negative, except for the pertinent positives and negatives detailed in the HPI.     History obtained from review of EMR, discussion with primary team, and interview with family, facility staff/caregiver and/or [name].  I reviewed available labs, medications, imaging, studies and related documents from the EMR.  Records reviewed and summarized above.    Physical Exam: Pulse 78, resp 16, b/p 150/82, sats 94-97% on room air.  Constitutional: NAD General: frail appearing EYES: anicteric sclera, lids intact, no discharge  ENMT: intact hearing, oral mucous membranes moist CV: S1S2, RRR, no LE edema Pulmonary: LCTA, no increased work of breathing, no cough Abdomen: normo-active BS + 4 quadrants, soft and non tender GU: deferred MSK: moves all extremities, ambulatory with walker Skin:  warm and dry, no rashes or wounds on visible skin Neuro:generalized weakness, A & O to person, place Psych: non-anxious affect, pleasant Hem/lymph/immuno: no widespread bruising   Thank you for the opportunity to participate in the care of [name].  The palliative care team will continue to follow. Please call our office at [phone] if we can be of additional assistance.   Ezekiel Slocumb, NP   COVID-19 PATIENT SCREENING TOOL Asked and negative response unless otherwise noted:   Have you had symptoms of covid, tested positive or been in contact with someone with symptoms/positive test in the past 5-10 days? No

## 2021-08-10 ENCOUNTER — Ambulatory Visit (INDEPENDENT_AMBULATORY_CARE_PROVIDER_SITE_OTHER): Payer: Medicare Other | Admitting: Family Medicine

## 2021-08-10 ENCOUNTER — Other Ambulatory Visit: Payer: Self-pay

## 2021-08-10 ENCOUNTER — Encounter: Payer: Self-pay | Admitting: *Deleted

## 2021-08-10 ENCOUNTER — Encounter: Payer: Self-pay | Admitting: Family Medicine

## 2021-08-10 VITALS — BP 144/78 | HR 96 | Temp 98.0°F

## 2021-08-10 DIAGNOSIS — M48061 Spinal stenosis, lumbar region without neurogenic claudication: Secondary | ICD-10-CM | POA: Diagnosis not present

## 2021-08-10 DIAGNOSIS — L602 Onychogryphosis: Secondary | ICD-10-CM | POA: Diagnosis not present

## 2021-08-10 DIAGNOSIS — Z515 Encounter for palliative care: Secondary | ICD-10-CM

## 2021-08-10 DIAGNOSIS — Z0000 Encounter for general adult medical examination without abnormal findings: Secondary | ICD-10-CM

## 2021-08-10 DIAGNOSIS — F0390 Unspecified dementia without behavioral disturbance: Secondary | ICD-10-CM

## 2021-08-10 DIAGNOSIS — E538 Deficiency of other specified B group vitamins: Secondary | ICD-10-CM | POA: Diagnosis not present

## 2021-08-10 DIAGNOSIS — K219 Gastro-esophageal reflux disease without esophagitis: Secondary | ICD-10-CM

## 2021-08-10 DIAGNOSIS — I714 Abdominal aortic aneurysm, without rupture, unspecified: Secondary | ICD-10-CM

## 2021-08-10 DIAGNOSIS — M8589 Other specified disorders of bone density and structure, multiple sites: Secondary | ICD-10-CM | POA: Diagnosis not present

## 2021-08-10 DIAGNOSIS — E7800 Pure hypercholesterolemia, unspecified: Secondary | ICD-10-CM | POA: Diagnosis not present

## 2021-08-10 DIAGNOSIS — R0989 Other specified symptoms and signs involving the circulatory and respiratory systems: Secondary | ICD-10-CM

## 2021-08-10 LAB — LIPID PANEL
Cholesterol: 235 mg/dL — ABNORMAL HIGH (ref 0–200)
HDL: 47.8 mg/dL (ref >39.00)
LDL Cholesterol: 158 mg/dL — ABNORMAL HIGH (ref 0–99)
NonHDL: 186.83
Total CHOL/HDL Ratio: 5
Triglycerides: 142 mg/dL (ref 0.0–149.0)
VLDL: 28.4 mg/dL (ref 0.0–40.0)

## 2021-08-10 LAB — CBC WITH DIFFERENTIAL/PLATELET
Basophils Absolute: 0.1 10*3/uL (ref 0.0–0.1)
Basophils Relative: 0.8 % (ref 0.0–3.0)
Eosinophils Absolute: 0.1 10*3/uL (ref 0.0–0.7)
Eosinophils Relative: 1.7 % (ref 0.0–5.0)
HCT: 38 % (ref 36.0–46.0)
Hemoglobin: 12.5 g/dL (ref 12.0–15.0)
Lymphocytes Relative: 21 % (ref 12.0–46.0)
Lymphs Abs: 1.4 10*3/uL (ref 0.7–4.0)
MCHC: 32.9 g/dL (ref 30.0–36.0)
MCV: 85.9 fl (ref 78.0–100.0)
Monocytes Absolute: 0.6 10*3/uL (ref 0.1–1.0)
Monocytes Relative: 9 % (ref 3.0–12.0)
Neutro Abs: 4.5 10*3/uL (ref 1.4–7.7)
Neutrophils Relative %: 67.5 % (ref 43.0–77.0)
Platelets: 249 10*3/uL (ref 150.0–400.0)
RBC: 4.42 Mil/uL (ref 3.87–5.11)
RDW: 15.5 % (ref 11.5–15.5)
WBC: 6.6 10*3/uL (ref 4.0–10.5)

## 2021-08-10 LAB — COMPREHENSIVE METABOLIC PANEL
ALT: 9 U/L (ref 0–35)
AST: 13 U/L (ref 0–37)
Albumin: 3.8 g/dL (ref 3.5–5.2)
Alkaline Phosphatase: 56 U/L (ref 39–117)
BUN: 10 mg/dL (ref 6–23)
CO2: 28 mEq/L (ref 19–32)
Calcium: 9.3 mg/dL (ref 8.4–10.5)
Chloride: 104 mEq/L (ref 96–112)
Creatinine, Ser: 0.95 mg/dL (ref 0.40–1.20)
GFR: 50.03 mL/min — ABNORMAL LOW (ref >60.00)
Glucose, Bld: 93 mg/dL (ref 70–99)
Potassium: 4.2 mEq/L (ref 3.5–5.1)
Sodium: 139 mEq/L (ref 135–145)
Total Bilirubin: 0.8 mg/dL (ref 0.2–1.2)
Total Protein: 6.5 g/dL (ref 6.0–8.3)

## 2021-08-10 LAB — VITAMIN B12: Vitamin B-12: 404 pg/mL (ref 211–911)

## 2021-08-10 LAB — TSH: TSH: 2.46 u[IU]/mL (ref 0.35–5.50)

## 2021-08-10 MED ORDER — ESOMEPRAZOLE MAGNESIUM 40 MG PO CPDR: DELAYED_RELEASE_CAPSULE | ORAL | 3 refills | Status: DC

## 2021-08-10 MED ORDER — CLOPIDOGREL BISULFATE 75 MG PO TABS: ORAL_TABLET | ORAL | 3 refills | Status: AC

## 2021-08-10 NOTE — Assessment & Plan Note (Signed)
Advanced age 87, with mobility impairment and dementia and chronic pain  Overall stable and as comfortable as she can be

## 2021-08-10 NOTE — Progress Notes (Signed)
Subjective:    Patient ID: Ashlee Reyes, female    DOB: 1921/01/20, 87 y.o.   MRN: [id number]  This visit occurred during the SARS-CoV-2 public health emergency.  Safety protocols were in place, including screening questions prior to the visit, additional usage of staff PPE, and extensive cleaning of exam room while observing appropriate contact time as indicated for disinfecting solutions.   HPI Pt presents for amw and annual review of chronic medical problems   I have personally reviewed the Medicare Annual Wellness questionnaire and have noted 1. The patient's medical and social history 2. Their use of alcohol, tobacco or illicit drugs 3. Their current medications and supplements 4. The patient's functional ability including ADL's, fall risks, home safety risks and hearing or visual             impairment. 5. Diet and physical activities 6. Evidence for depression or mood disorders  The patients weight, height, BMI have been recorded in the chart and visual acuity is per eye clinic.  I have made referrals, counseling and provided education to the patient based review of the above and I have provided the pt with a written personalized care plan for preventive services. Reviewed and updated provider list, see scanned forms.  See scanned forms.  Routine anticipatory guidance given to patient.  See health maintenance. Breast cancer screen- last mammogram 2003 and declines due to age Self breast exam- does not do  Flu vaccine fall Covid vaccinated with one booster Tetanus vaccine 8/13 Td  Pneumovax - was allergic to prevnar, not getting pna 23 Zoster vaccine-declines  Dexa 10/11, osteopenia Declines further imaging for this  Falls-none  Fractures-remote hx of spinal comp fracture Supplements-vitamin D 1000 iu daily Exercise - very little, mobility impaired from spinal stenosis   Advance directive: up to date, palliative care status due to pain and advanced age and DNR is  clearly designated (at home as well)  Cognitive function addressed- see scanned forms- and if abnormal then additional documentation follows.  Has dementia of adv age  Worsening memory with age More confusion  No longer understanding basic instructions   PMH and SH reviewed  Meds, vitals, and allergies reviewed.   ROS: See HPI.  Otherwise negative.    Weight :-no weight due to wheelchair bound   Eating well  Good veg intake  Good appetite despite nausea  Family has to chop meat for her   Hearing/vision: Hearing Screening - Comments:: Pt wears hearing aids Vision Screening - Comments:: Eye exam in May 2022 with Dr. Satira Sark Has glaucoma- in treatment  Has home care    Care team: [facility]- allergy  MacDiarm[facility]-urol [facility]-derm [facility]-neuro [facility] - GI  [facility]-oph   H/o labile bp BP Readings from Last 3 Encounters:  08/10/21 (!) 144/78  06/12/21 (!) 141/84  05/30/21 138/66   She takes amlodipine prn for bp when elevated  Also plavix for carotid stanosis and CAD Pulse Readings from Last 3 Encounters:  08/10/21 96  06/12/21 73  05/30/21 82   In general nothing new    History of low B12 Lab Results  Component Value Date   VITAMINB12 424 12/01/2020  Takes B12 daily   Takes nexium daily for GERD along with pepcid  Suffers from chronic nausea -every day  Eats well however   Hyperlipidemia  Due for labs Lab Results  Component Value Date   CHOL 227 (H) 07/27/2019   HDL 43.80 07/27/2019   LDLCALC 154 (H) 07/27/2019  LDLDIRECT 161.0 02/06/2016   TRIG 146.0 07/27/2019   CHOLHDL 5 07/27/2019    Cannot take statins due to intolerance   Takes tramadol for chronic pain/spinal stenosis at night  Status of palliative care due to that and advanced age  Pain control is so so  Ortho-Dr Marlou Sa  L knee and L leg -worst symptoms    Has thick toe nails  Hard to get her to the podiatrist   Patient Active Problem List   Diagnosis Date Noted   Dementia,  old age ([facility]) 08/10/2021   Thickened nails 08/10/2021   Dysphagia 03/21/2021   Vitamin B12 deficiency 10/12/2020   Nausea 09/08/2020   Fatigue 09/08/2020   Current use of proton pump inhibitor 09/08/2020   Labile blood pressure 07/31/2019   Allergic rhinitis 02/20/2019   Palliative care status 01/12/2019   Hypertensive urgency 12/24/2018   History of compression fracture of spine 09/26/2018   Mobility impaired 09/26/2018   History of endovascular stent graft for abdominal aortic aneurysm (AAA) 01/01/2018   Unilateral primary osteoarthritis, left knee 07/11/2017   Hyperlipidemia 03/12/2017   Frequent UTI/interstitial cystitis 02/06/2016   Hypotension 01/09/2016   Carotid stenosis 06/01/2015   Local reaction to immunization 02/21/2015   Encounter for Medicare annual wellness exam 02/15/2015   Estrogen deficiency 02/15/2015   Sinusitis, chronic 01/12/2015   IBS (irritable bowel syndrome) 01/12/2015   Orthostatic hypotension 01/11/2015   Palpitations 05/13/2014   Second degree heart block 10/18/2013   Aneurysm of abdominal vessel ([facility]) 06/25/2013   Osteopenia 01/13/2013   CAD (coronary artery disease)    Chronic depression 06/03/2007   GERD 06/02/2007   Asthma    Chronic interstitial cystitis    Osteoarthritis    Spinal stenosis of lumbar region    History of urinary tract infection    Past Medical History:  Diagnosis Date   AAA (abdominal aortic aneurysm) ([facility])    followed by Dr Oneida Alar. Last u/s07/10 stable AAA with largest measurement 3.97 x 4.02cm.   Allergic rhinitis, seasonal    Asthma    Atrial fibrillation (HCC)    CAD (coronary artery disease) 07/2009   mod by cath [id number]   Complication of anesthesia    Depression    Disc disease, degenerative, lumbar or lumbosacral    SPINAL STENOSIS   Diverticulosis    GERD (gastroesophageal reflux disease)    Hemorrhoids    History of recurrent UTIs    Hyperlipidemia    IBS (irritable bowel syndrome)    IC (irritable  colon)    DMSO in past   Interstitial cystitis    Osteoarthritis    spine/spinal stenosis   Osteopenia 2011   PONV (postoperative nausea and vomiting)    Skin cancer    basal cell cancer - face.   Past Surgical History:  Procedure Laterality Date   ABDOMINAL AORTIC ENDOVASCULAR STENT GRAFT N/A 07/01/2013   Procedure: ABDOMINAL AORTIC ENDOVASCULAR STENT GRAFT- GORE;  Surgeon: Elam Dutch, MD;  Location: [facility];  Service: Vascular;  Laterality: N/A;  Ultrasound guided   ABDOMINAL HYSTERECTOMY     partial not cancer   BREAST SURGERY     lumpectomy   CYSTOCELE REPAIR     ESOPHAGOGASTRODUODENOSCOPY  09/1998   erosive esphagitis ? stricture treated 12/2002   HERNIA REPAIR  [id number]   umbilical hernia   [place]     for film after cataract surgery.   RIGHT COLECTOMY  1997   benign  SKIN LESION EXCISION  05/2011   basal cell skin lesion removed.   Social History   Tobacco Use   Smoking status: Never   Smokeless tobacco: Never  Vaping Use   Vaping Use: Never used  Substance Use Topics   Alcohol use: No    Alcohol/week: 0.0 standard drinks   Drug use: No   Family History  Problem Relation Age of Onset   Heart disease Mother        deceased age 65 heart attack.   Heart attack Mother    Other Mother        varicose veins   Varicose Veins Mother    Heart disease Father        age 54 heart attack   Cancer Father    Stroke Brother    Cancer Brother    Heart disease Brother    Heart attack Brother    Cancer Daughter    Diabetes Son    Allergies  Allergen Reactions   Amoxicillin-Pot Clavulanate Nausea And Vomiting    Has patient had a PCN reaction causing immediate rash, facial/tongue/throat swelling, SOB or lightheadedness with hypotension: No Has patient had a PCN reaction causing severe rash involving mucus membranes or skin necrosis: No Has patient had a PCN reaction that required hospitalization: No Has patient had a PCN reaction  occurring within the last 10 years: No If all of the above answers are "NO", then may proceed with Cephalosporin use.    Avelox [Moxifloxacin] Nausea And Vomiting   Flovent Hfa [Fluticasone] Other (See Comments)    "makes her feel funny" per family member   Ibuprofen Other (See Comments)    GI upset   Keflex [Cephalexin] Other (See Comments)    abd pain / GI upset   Nitrofurantoin Other (See Comments)    Chest pain or indigestion with nausea   Omeprazole Nausea Only   Prevnar [Pneumococcal 13-Val Conj Vacc] Itching    Severe local reaction with redness and itching    Rosuvastatin Other (See Comments)    Foot pain   Sertraline Hcl Other (See Comments)    Made her more depressed   Tetanus Toxoids     Local redness and swelling    Current Outpatient Medications on File Prior to Visit  Medication Sig Dispense Refill   acetaminophen (TYLENOL) 500 MG tablet Take 1 tablet (500 mg total) by mouth every 4 (four) hours as needed for mild pain. 30 tablet 0   albuterol (VENTOLIN HFA) 108 (90 Base) MCG/ACT inhaler INHALE 2 PUFFS BY MOUTH EVERY 4 HOURS AS NEEDED FOR WHEEZING 18 g 1   amLODipine (NORVASC) 2.5 MG tablet Take one pill by mouth daily as needed for elevated blood pressure (over 145/90) 30 tablet 11   cetirizine (ZYRTEC) 10 MG tablet TAKE 1 TABLET(10 MG) BY MOUTH DAILY AS NEEDED 30 tablet 11   Cholecalciferol (VITAMIN D3) 25 MCG (1000 UT) CAPS Take by mouth.     COVID-19 mRNA vaccine, Pfizer, 30 MCG/0.3ML injection USE AS DIRECTED .3 mL 0   docusate sodium (COLACE) 100 MG capsule Take 1 capsule (100 mg total) by mouth every evening. 10 capsule 0   famotidine (PEPCID) 40 MG tablet Take 1 tablet (40 mg total) by mouth daily. 90 tablet 2   fluticasone (FLONASE) 50 MCG/ACT nasal spray Place 2 sprays into both nostrils daily. 16 g 11   lactase (LACTAID) 3000 units tablet Take 3,000 Units by mouth 3 (three) times daily as needed.  LUMIGAN 0.01 % SOLN Place 1 drop into both eyes at  bedtime.     ondansetron (ZOFRAN-ODT) 4 MG disintegrating tablet DISSOLVE 1 TABLET(4 MG) ON THE TONGUE EVERY 8 HOURS AS NEEDED FOR NAUSEA OR VOMITING 90 tablet 3   Polyethyl Glycol-Propyl Glycol (SYSTANE OP) Place 1 drop into both eyes every 8 (eight) hours as needed (dry eyes).      polyethylene glycol (MIRALAX / GLYCOLAX) packet Take 17 g by mouth daily as needed for mild constipation. 14 each 0   Probiotic Product (ALIGN) 4 MG CAPS Take 1 capsule (4 mg total) by mouth daily.     traMADol (ULTRAM) 50 MG tablet TAKE 1/2 TABLET BY MOUTH EVERY MORNING THEN TAKE 1 TABLET BY MOUTH AT BEDTIME 45 tablet 3   trimethoprim (TRIMPEX) 100 MG tablet Take 1 tablet (100 mg total) by mouth daily with lunch. 30 tablet 0   No current facility-administered medications on file prior to visit.     Review of Systems  Constitutional:  Positive for fatigue. Negative for activity change, appetite change, fever and unexpected weight change.  HENT:  Negative for congestion, ear pain, rhinorrhea, sinus pressure and sore throat.   Eyes:  Negative for pain, redness and visual disturbance.  Respiratory:  Negative for cough, shortness of breath and wheezing.   Cardiovascular:  Negative for chest pain and palpitations.  Gastrointestinal:  Negative for abdominal pain, blood in stool, constipation and diarrhea.  Endocrine: Negative for polydipsia and polyuria.  Genitourinary:  Negative for dysuria, frequency and urgency.  Musculoskeletal:  Positive for arthralgias and back pain. Negative for myalgias.  Skin:  Negative for pallor and rash.  Allergic/Immunologic: Negative for environmental allergies.  Neurological:  Negative for dizziness, syncope and headaches.       Poor balance  Hematological:  Negative for adenopathy. Does not bruise/bleed easily.  Psychiatric/Behavioral:  Positive for confusion and decreased concentration. Negative for dysphoric mood. The patient is not nervous/anxious.       Objective:   Physical  Exam Constitutional:      General: She is not in acute distress.    Appearance: Normal appearance. She is well-developed. She is obese. She is not ill-appearing or diaphoretic.  HENT:     Head: Normocephalic and atraumatic.     Right Ear: Tympanic membrane, ear canal and external ear normal.     Left Ear: Tympanic membrane, ear canal and external ear normal.     Nose: Nose normal. No congestion.     Mouth/Throat:     Mouth: Mucous membranes are moist.     Pharynx: Oropharynx is clear. No posterior oropharyngeal erythema.  Eyes:     General: No scleral icterus.    Extraocular Movements: Extraocular movements intact.     Conjunctiva/sclera: Conjunctivae normal.     Pupils: Pupils are equal, round, and reactive to light.  Neck:     Thyroid: No thyromegaly.     Vascular: No carotid bruit or JVD.  Cardiovascular:     Rate and Rhythm: Normal rate and regular rhythm.     Pulses: Normal pulses.     Heart sounds: Murmur heard.    No gallop.  Pulmonary:     Effort: Pulmonary effort is normal. No respiratory distress.     Breath sounds: Normal breath sounds. No wheezing.     Comments: Good air exch Chest:     Chest wall: No tenderness.  Abdominal:     General: Bowel sounds are normal. There is no distension or abdominal  bruit.     Palpations: Abdomen is soft. There is no mass.     Tenderness: There is no abdominal tenderness.     Hernia: No hernia is present.  Genitourinary:    Comments: Breast exam: No mass, nodules, thickening, tenderness, bulging, retraction, inflamation, nipple discharge or skin changes noted.  No axillary or clavicular LA.     Musculoskeletal:        General: No tenderness. Normal range of motion.     Cervical back: Normal range of motion and neck supple. No rigidity. No muscular tenderness.     Right lower leg: Edema present.     Left lower leg: Edema present.     Comments: Trace pedal edema  Lymphadenopathy:     Cervical: No cervical adenopathy.  Skin:     General: Skin is warm and dry.     Coloration: Skin is not pale.     Findings: No erythema or rash.     Comments: Fair complexion  Many SKs   Neurological:     Mental Status: She is alert. Mental status is at baseline.     Cranial Nerves: No cranial nerve deficit.     Motor: No abnormal muscle tone.     Coordination: Coordination normal.     Gait: Gait normal.     Deep Tendon Reflexes: Reflexes are normal and symmetric. Reflexes normal.  Psychiatric:        Mood and Affect: Mood normal.        Speech: Speech normal.        Cognition and Memory: She exhibits impaired recent memory.     Comments: Pleasant  Answers questions appropriately           Assessment & Plan:   Problem List Items Addressed This Visit       Cardiovascular and Mediastinum   Aneurysm of abdominal vessel (HCC) (Chronic)    No clinical changes  BP is fairly controlled         Digestive   GERD (Chronic)    Requires nexium and pepcid for relief  Watching b12 level  Good appetite and diet       Relevant Medications   esomeprazole (NEXIUM) 40 MG capsule     Nervous and Auditory   Dementia, old age [facility])    89 with age but no behavioral disturbance  Has ATC care and no accidents  Pleasant today  Chooses not to treat at her age        Musculoskeletal and Integument   Osteopenia    Declines another dexa No falls  No recent fractures  Past h/o spinal comp fx  Taking vit D        Other   Spinal stenosis of lumbar region (Chronic)    Limits exercise and mobility In wheelchair today        Encounter for Medicare annual wellness exam - Primary    Reviewed health habits including diet and exercise and skin cancer prevention Reviewed appropriate screening tests for age  Also reviewed health mt list, fam hx and immunization status , as well as social and family history   See HPI Labs ordered Declines mammogram/breast cancer screening due to age  Flu vaccine planned for fall   covid vaccinated with booster  Declines shingrix vaccine Declines dexa (no falls or recent fractures and takes vit D) Advance directive is up to date/also DNR order as part of palliative care Reviewed cognitive status /continues to decline and has ATC supervision  Wearing hearing aides  utd eye and vision care (glaucoma)         Hyperlipidemia    Labs today Disc goals for lipids and reasons to control them Rev last labs with pt Rev low sat fat diet in detail Intolerant of statin       Relevant Orders   Comprehensive metabolic panel (Completed)   Lipid panel (Completed)   Palliative care status    Advanced age 38, with mobility impairment and dementia and chronic pain  Overall stable and as comfortable as she can be      Labile blood pressure    Reviewed readings from home  BP: (!) 144/78 no recent hypotension    Fair control  Has amlodipine for prn use       Relevant Orders   CBC with Differential/Platelet (Completed)   Comprehensive metabolic panel (Completed)   TSH (Completed)   Vitamin B12 deficiency    B12 level today  Oral supplementation       Relevant Orders   CBC with Differential/Platelet (Completed)   Vitamin B12 (Completed)   Thickened nails    Recommend use of dremel if not able to take pt to podiatrist  Caregiver does a good job

## 2021-08-10 NOTE — Assessment & Plan Note (Signed)
Limits exercise and mobility In wheelchair today

## 2021-08-10 NOTE — Patient Instructions (Addendum)
You can try filing nails with a hand held dremel device for human nails (on line) or go to the podiatrist   No change in medicines  Blood pressure looks stable today   Let us know if you need anything

## 2021-08-10 NOTE — Assessment & Plan Note (Signed)
B12 level today  Oral supplementation

## 2021-08-10 NOTE — Assessment & Plan Note (Signed)
Declines another dexa No falls  No recent fractures  Past h/o spinal comp fx  Taking vit D

## 2021-08-10 NOTE — Assessment & Plan Note (Signed)
Requires nexium and pepcid for relief  Watching b12 level  Good appetite and diet

## 2021-08-10 NOTE — Assessment & Plan Note (Signed)
No clinical changes  BP is fairly controlled

## 2021-08-10 NOTE — Assessment & Plan Note (Signed)
Labs today Disc goals for lipids and reasons to control them Rev last labs with pt Rev low sat fat diet in detail Intolerant of statin

## 2021-08-10 NOTE — Assessment & Plan Note (Signed)
Reviewed readings from home  BP: (!) 144/78 no recent hypotension    Fair control  Has amlodipine for prn use

## 2021-08-10 NOTE — Assessment & Plan Note (Signed)
Recommend use of dremel if not able to take pt to podiatrist  Caregiver does a good job

## 2021-08-10 NOTE — Assessment & Plan Note (Signed)
Reviewed health habits including diet and exercise and skin cancer prevention Reviewed appropriate screening tests for age  Also reviewed health mt list, fam hx and immunization status , as well as social and family history   See HPI Labs ordered Declines mammogram/breast cancer screening due to age  Flu vaccine planned for fall  covid vaccinated with booster  Declines shingrix vaccine Declines dexa (no falls or recent fractures and takes vit D) Advance directive is up to date/also DNR order as part of palliative care Reviewed cognitive status /continues to decline and has ATC supervision  Wearing hearing aides  utd eye and vision care (glaucoma)

## 2021-08-10 NOTE — Assessment & Plan Note (Signed)
Progressing with age but no behavioral disturbance  Has ATC care and no accidents  Pleasant today  Chooses not to treat at her age

## 2021-08-16 ENCOUNTER — Telehealth: Payer: Self-pay | Admitting: Family Medicine

## 2021-08-16 NOTE — Telephone Encounter (Signed)
Daughter notified of Dr. Tower's comments and verbalized understanding  

## 2021-08-16 NOTE — Telephone Encounter (Signed)
Mrs.Kirkman called in and stated that she was just checking on the lab results seeing if anything jumped out to Dr. Glori Bickers for mom. She stated that she is not feeling better.

## 2021-09-04 ENCOUNTER — Other Ambulatory Visit: Payer: Self-pay

## 2021-09-04 ENCOUNTER — Other Ambulatory Visit: Payer: Medicare Other | Admitting: Student

## 2021-09-04 DIAGNOSIS — R110 Nausea: Secondary | ICD-10-CM | POA: Diagnosis not present

## 2021-09-04 DIAGNOSIS — Z515 Encounter for palliative care: Secondary | ICD-10-CM | POA: Diagnosis not present

## 2021-09-04 DIAGNOSIS — M1712 Unilateral primary osteoarthritis, left knee: Secondary | ICD-10-CM | POA: Diagnosis not present

## 2021-09-04 DIAGNOSIS — F0390 Unspecified dementia without behavioral disturbance: Secondary | ICD-10-CM | POA: Diagnosis not present

## 2021-09-04 NOTE — Progress Notes (Signed)
Designer, jewellery Palliative Care Consult Note Telephone: [phone]  Fax: [phone]    Date of encounter: 09/04/21 11:59 AM PATIENT NAME: Ashlee Reyes [address] [place] [address]   [phone] (home)  DOB: Nov 08, 1921 MRN: [id number] PRIMARY CARE PROVIDER:    Abner Greenspan, MD,  [place] [address] [phone]  REFERRING PROVIDER:   Abner Greenspan, MD [address] [place],  [place] [address] [phone]  RESPONSIBLE PARTY:    Contact Information     Name Relation Home Work [place] Daughter [phone]    [place], Catarino [phone]   Kemy[place], Buser Daughter [phone]   Dickson,Lorrie    [phone]   Charisse March    [phone]        I met face to face with patient and family in the home. Palliative Care was asked to follow this patient by consultation request of  Ashlee Reyes, Ashlee Fanny, MD to address advance care planning and complex medical decision making. This is a follow up visit.                                   ASSESSMENT AND PLAN / RECOMMENDATIONS:   Advance Care Planning/Goals of Care: Goals include to maximize quality of life and symptom management.   CODE STATUS: DNR  Symptom Management/Plan:  Dementia-patient  is requiring more cueing, direction with adl's. Encourage use of walker with ambulation. Family to continue supportive care; reorient/redirect as needed. Monitor for falls/safety.   OA- Continue Tramadol QHS; start acetaminophen 1023m each afternoon.  Nausea-continue ondansetron PRN. We discussed potential causes for her nausea, monitor if patient is anxious, needs to have a bowel movement, food triggers.  Follow up Palliative Care Visit: Palliative care will continue to follow for complex medical decision making, advance care planning, and clarification of goals. Return in 8 weeks or  prn.  I spent 40 minutes providing this consultation. More than 50% of the time in this consultation was spent in counseling and care coordination.   PPS: 40%  HOSPICE ELIGIBILITY/DIAGNOSIS: TBD  Chief Complaint: Palliative Medicine follow up visit.   HISTORY OF PRESENT ILLNESS:  CDoyne Ellingeris a 87y.o. year old female  with dementia, hypertension, edema, dysphagia, OA, lumbar stenosis, IBS, hx of skin cancer.    Patient resides at home with daughter. Daughter reports patient requiring more direction and cueing with adl''s. She is having more difficulty recognizing family members. She is moving slower with ambulation. No recent falls. Patient denies having any pain at present; occasional shortness of breath with exertion. Occasional nausea; takes ondansetron with effectiveness. Her blood pressures have been better managed. Blood pressures have been mostly 1620'Bsystolic, diastolic 855'H Denies dizziness, headache. Occasionally feeling light headed per daughter. She is sleeping well at night. A 10 point review of systems is negative, except for the pertinent positives and negatives detailed in the HPI.   History obtained from review of EMR, discussion with primary team, and interview with family, facility staff/caregiver and/or Ashlee Reyes.  I reviewed available labs, medications, imaging, studies and related documents from the EMR.  Records reviewed and summarized above.    Physical Exam: Pulse 84, resp 16, b/p 122/72, sats 97% on room air Constitutional: NAD General: frail appearing EYES: anicteric sclera, lids intact, no discharge  ENMT: intact hearing, oral mucous membranes moist,  dentition intact CV: S1S2, RRR, no LE edema Pulmonary: LCTA, no increased work of breathing, no cough Abdomen: normo-active BS + 4 quadrants, soft and non tender GU: deferred MSK: warm and dry, no rashes or wounds on visible skin Neuro: generalized weakness, Alert an oriented to person Psych:  non-anxious affect, pleasant Hem/lymph/immuno: no widespread bruising   Thank you for the opportunity to participate in the care of Ashlee Reyes.  The palliative care team will continue to follow. Please call our office at [phone] if we can be of additional assistance.   Ezekiel Slocumb, NP   COVID-19 PATIENT SCREENING TOOL Asked and negative response unless otherwise noted:   Have you had symptoms of covid, tested positive or been in contact with someone with symptoms/positive test in the past 5-10 days?  No

## 2021-09-27 DIAGNOSIS — H401133 Primary open-angle glaucoma, bilateral, severe stage: Secondary | ICD-10-CM | POA: Diagnosis not present

## 2021-09-27 DIAGNOSIS — Z961 Presence of intraocular lens: Secondary | ICD-10-CM | POA: Diagnosis not present

## 2021-09-27 DIAGNOSIS — H43813 Vitreous degeneration, bilateral: Secondary | ICD-10-CM | POA: Diagnosis not present

## 2021-09-27 DIAGNOSIS — H353132 Nonexudative age-related macular degeneration, bilateral, intermediate dry stage: Secondary | ICD-10-CM | POA: Diagnosis not present

## 2021-10-06 ENCOUNTER — Other Ambulatory Visit: Payer: Self-pay | Admitting: Family Medicine

## 2021-10-21 ENCOUNTER — Other Ambulatory Visit: Payer: Self-pay | Admitting: Family Medicine

## 2021-11-06 ENCOUNTER — Other Ambulatory Visit: Payer: Self-pay

## 2021-11-06 ENCOUNTER — Other Ambulatory Visit: Payer: Medicare Other | Admitting: Student

## 2021-11-06 DIAGNOSIS — Z515 Encounter for palliative care: Secondary | ICD-10-CM | POA: Diagnosis not present

## 2021-11-06 DIAGNOSIS — R110 Nausea: Secondary | ICD-10-CM | POA: Diagnosis not present

## 2021-11-06 DIAGNOSIS — M1990 Unspecified osteoarthritis, unspecified site: Secondary | ICD-10-CM | POA: Diagnosis not present

## 2021-11-06 DIAGNOSIS — F0390 Unspecified dementia without behavioral disturbance: Secondary | ICD-10-CM | POA: Diagnosis not present

## 2021-11-06 NOTE — Progress Notes (Signed)
Therapist, nutritional Palliative Care Consult Note Telephone: [phone]  Fax: [phone]    Date of encounter: 11/06/21 11:49 AM PATIENT NAME: Ashlee Reyes [address] [place] [address]   [phone] (home)  DOB: [birth year] MRN: [id number] PRIMARY CARE PROVIDER:    Judy Pimple, MD,  [address] [place] [address] [phone]  REFERRING PROVIDER:   Judy Pimple, MD [address] [place],  [place] [address] [phone]  RESPONSIBLE PARTY:    Contact Information     Name Relation Home Work [place] Daughter [phone]    Kyriana, Yankee [phone]   Yasira, Engelson Daughter [phone]   Dickson,Lorrie    [phone]   Phoebe Perch    [phone]        I met face to face with patient and family in the home. Palliative Care was asked to follow this patient by consultation request of  Tower, Audrie Gallus, MD to address advance care planning and complex medical decision making. This is a follow up visit.                                   ASSESSMENT AND PLAN / RECOMMENDATIONS:   Advance Care Planning/Goals of Care: Goals include to maximize quality of life and symptom management. Our advance care planning conversation included a discussion about:    The value and importance of advance care planning  Experiences with loved ones who have been seriously ill or have died  Exploration of personal, cultural or spiritual beliefs that might influence medical decisions  Exploration of goals of care in the event of a sudden injury or illness  Identification and preparation of a healthcare agent  Review and updating or creation of an  advance directive document . Decision not to resuscitate or to de-escalate disease focused treatments due to poor prognosis. CODE STATUS: DNR  Symptom Management/Plan:  Dementia-patient  is requiring more  cueing, direction with adl's. Encourage use of walker with ambulation. Family to continue supportive care; reorient/redirect as needed. Monitor for falls/safety.    OA- Continue Tramadol and acetaminophen as directed.    Nausea-continue ondansetron PRN. We discussed potential causes for her nausea, monitor if patient is anxious, needs to have a bowel movement, food triggers.   Follow up Palliative Care Visit: Palliative care will continue to follow for complex medical decision making, advance care planning, and clarification of goals. Return in 8  weeks or prn.  I spent 40 minutes providing this consultation. More than 50% of the time in this consultation was spent in counseling and care coordination.   PPS: 40%  HOSPICE ELIGIBILITY/DIAGNOSIS: TBD  Chief Complaint: Palliative Medicine follow up visit.   HISTORY OF PRESENT ILLNESS:  Ashlee Reyes is a 87 y.o. year old female  with dementia, hypertension, edema, dysphagia, OA, lumbar stenosis, IBS, hx of skin cancer.    Needing more cueing/ redirection with tasks. Occasional headaches; takes acetaminophen with relief. Denies pain otherwise. Denies shortness of breath. Good appetite endorsed. Still having occasional nausea episodes. B/p well managed; few episodes with systolic b/p in 140-160's. Sleeping well. No recent medication changes; no recent infections, ED or hospitalizations. A 10-point review of systems is negative, except for the pertinent positives and negatives detailed in the HPI.    History obtained from review of EMR, discussion with primary team, and interview  with family, facility staff/caregiver and/or Ms. Margraf.  I reviewed available labs, medications, imaging, studies and related documents from the EMR.  Records reviewed and summarized above.    Physical Exam: Pulse 76, resp 16, b/p 136/72, sats 97% on room air. Constitutional: NAD General: frail appearing EYES: anicteric sclera, lids intact, no discharge   ENMT: intact hearing, oral mucous membranes moist, dentition intact CV: S1S2, RRR, no LE edema Pulmonary: LCTA, no increased work of breathing, no cough, room air Abdomen normo-active BS + 4 quadrants, soft and non tender GU: deferred MSK: no sarcopenia, moves all extremities, ambulatory with walker Skin: warm and dry, no rashes or wounds on visible skin Neuro: generalized weakness, A & O to person  Psych: non-anxious affect, pleasant Hem/lymph/immuno: no widespread bruising   Thank you for the opportunity to participate in the care of [name].  The palliative care team will continue to follow. Please call our office at [phone] if we can be of additional assistance.   Ezekiel Slocumb, NP   COVID-19 PATIENT SCREENING TOOL Asked and negative response unless otherwise noted:   Have you had symptoms of covid, tested positive or been in contact with someone with symptoms/positive test in the past 5-10 days? No

## 2021-12-01 ENCOUNTER — Other Ambulatory Visit: Payer: Self-pay | Admitting: Family Medicine

## 2021-12-01 NOTE — Telephone Encounter (Signed)
Last filled 10-21-21 #45 Last OV 08-10-21 No Future OV [facility] [address] [address] [employer]

## 2022-01-08 ENCOUNTER — Telehealth: Payer: Self-pay | Admitting: Family Medicine

## 2022-01-08 ENCOUNTER — Other Ambulatory Visit: Payer: Medicare Other | Admitting: Student

## 2022-01-08 ENCOUNTER — Other Ambulatory Visit: Payer: Self-pay

## 2022-01-08 DIAGNOSIS — F0280 Dementia in other diseases classified elsewhere without behavioral disturbance: Secondary | ICD-10-CM | POA: Diagnosis not present

## 2022-01-08 DIAGNOSIS — R42 Dizziness and giddiness: Secondary | ICD-10-CM

## 2022-01-08 DIAGNOSIS — Z515 Encounter for palliative care: Secondary | ICD-10-CM | POA: Diagnosis not present

## 2022-01-08 DIAGNOSIS — R110 Nausea: Secondary | ICD-10-CM

## 2022-01-08 DIAGNOSIS — M1990 Unspecified osteoarthritis, unspecified site: Secondary | ICD-10-CM

## 2022-01-08 DIAGNOSIS — G309 Alzheimer's disease, unspecified: Secondary | ICD-10-CM

## 2022-01-08 NOTE — Progress Notes (Signed)
Therapist, nutritional Palliative Care Consult Note Telephone: [phone]  Fax: [phone]    Date of encounter: 01/08/22 11:15 AM PATIENT NAME: Ashlee Reyes [address] [place] [address]   [phone] (home)  DOB: June 16, 1921 MRN: [id number] PRIMARY CARE PROVIDER:    Judy Pimple, MD,  [address] [place] [address] [phone]  REFERRING PROVIDER:   Judy Pimple, MD [address] [place],  [place] [address] [phone]  RESPONSIBLE PARTY:    Contact Information     Name Relation Home Work [place] Daughter [phone]    Tytiana, Coles [phone]   Leen, Tworek Daughter [phone]   Dickson,Lorrie    [phone]   Phoebe Perch    [phone]        I met face to face with patient and family in the home. Palliative Care was asked to follow this patient by consultation request of  Tower, Audrie Gallus, MD to address advance care planning and complex medical decision making. This is a follow up visit.                                   ASSESSMENT AND PLAN / RECOMMENDATIONS:   Advance Care Planning/Goals of Care: Goals include to maximize quality of life and symptom management. Patient/health care surrogate gave his/her permission to discuss. Our advance care planning conversation included a discussion about:    The value and importance of advance care planning  Experiences with loved ones who have been seriously ill or have died  Exploration of personal, cultural or spiritual beliefs that might influence medical decisions  Exploration of goals of care in the event of a sudden injury or illness  CODE STATUS: DNR   Education provided on Palliative Medicine. Patient is requiring more assistance with adl's, frequent cueing/redirection. Palliative Medicine will continue to provide supportive care. Discussed long range planning.    Symptom Management/Plan:  Dementia-patient  is requiring more cueing, direction, assistance with adl's. Encourage use of walker with ambulation. Family to continue supportive care; reorient/redirect as needed. Monitor for falls/safety. We discussed long range planning. Daughter is going to check on seeing if patient will qualify for any VA spousal benefits. She is also going to check with her insurance to see if they will assist with any caregiver support in the home.    OA- Continue Tramadol QHS; acetaminophen PRN.   Dizziness-We discussed possible causes. Her blood pressure has been managed fairly well. Patient with past history of dizziness related to inner ear. She also has occasional nausea. Continue meclizine 12.5 mg PRN once daily; may give a second dose in the evening PRN.  Nausea-continue ondansetron PRN.   Follow up Palliative Care Visit: Palliative care will continue to follow for complex medical decision making, advance care planning, and clarification of goals. Return in 8 weeks or prn.   This visit was coded based on medical decision making (MDM).  PPS: 40%  HOSPICE ELIGIBILITY/DIAGNOSIS: TBD  Chief Complaint: Palliative Medicine follow up visit.   HISTORY OF PRESENT ILLNESS:  Ashlee Reyes is a 88 y.o. year old female  with dementia, hypertension, edema, dysphagia, OA, lumbar stenosis, IBS, hx of skin cancer.     Resides at home with daughter. She is having more episodes of dizziness; usually in the morning. Reported after she has eaten. Taking meclizine 12.5 mg QD  PRN. Still occasional nausea; no vomiting. Having more difficulty following directions; finding her bedroom and bathroom. Appetite is good. Increased hearing difficulty; had her ears cleaned on on 12/27/2021.  Weight 176.8 pounds. Blood pressures have been ranging 223'C-097'V systolic/ 49'N-71'K diastolic. Occasionally 150-170's, but rare. Taking acetaminophen PRN headaches. No recent medication changes; no  recent infections, ED or hospitalizations. A 10-point review of systems is negative, except for the pertinent positives and negatives detailed in the HPI.    History obtained from review of EMR, discussion with primary team, and interview with family, facility staff/caregiver and/or [name].  I reviewed available labs, medications, imaging, studies and related documents from the EMR.  Records reviewed and summarized above.    Physical Exam: Pulse 84, resp 16, b/p 138/82, sats 97% on room air. Constitutional: NAD General: frail appearing EYES: anicteric sclera, lids intact, no discharge  ENMT: intact hearing, oral mucous membranes moist, dentition intact CV: S1S2, RRR, no LE edema Pulmonary: LCTA, no increased work of breathing, no cough, room air Abdomen:  normo-active BS + 4 quadrants, soft and non tender, no ascites GU: deferred MSK: no sarcopenia, moves all extremities, ambulatory with walker Skin: warm and dry, no rashes or wounds on visible skin Neuro:  no generalized weakness, A & O to person, forgetful Psych: non-anxious affect, pleasant Hem/lymph/immuno: no widespread bruising   Thank you for the opportunity to participate in the care of [name].  The palliative care team will continue to follow. Please call our office at [phone] if we can be of additional assistance.   Ezekiel Slocumb, NP   COVID-19 PATIENT SCREENING TOOL Asked and negative response unless otherwise noted:   Have you had symptoms of covid, tested positive or been in contact with someone with symptoms/positive test in the past 5-10 days? No

## 2022-01-08 NOTE — Telephone Encounter (Signed)
Pt daughter called stating that pt needs an exception letter for a quantity limit for  medication esomeprazole (NEXIUM) 40 MG capsule. Pt daughter states that the quantity limit is 30 for once a day but pt needs a quantity of 60 for taking it twice a day. Pt daughter states that [employer] is Forensic psychologist ([place]) phone # [phone] fax # [phone]. Please advise.

## 2022-01-09 NOTE — Telephone Encounter (Signed)
PA for the 180 quantity started at www.covermymeds.com, I will await a response

## 2022-01-11 NOTE — Telephone Encounter (Signed)
Received fax that insurance approved Nexium-dates are 12/17/21-01/09/23. I called Ms Ashlee Reyes to let her know and left a message to call back. I placed fax from insurance in to be scanned into the chart

## 2022-01-17 DIAGNOSIS — L720 Epidermal cyst: Secondary | ICD-10-CM | POA: Diagnosis not present

## 2022-01-17 DIAGNOSIS — L918 Other hypertrophic disorders of the skin: Secondary | ICD-10-CM | POA: Diagnosis not present

## 2022-01-17 DIAGNOSIS — L820 Inflamed seborrheic keratosis: Secondary | ICD-10-CM | POA: Diagnosis not present

## 2022-01-17 DIAGNOSIS — Z85828 Personal history of other malignant neoplasm of skin: Secondary | ICD-10-CM | POA: Diagnosis not present

## 2022-01-17 DIAGNOSIS — D3613 Benign neoplasm of peripheral nerves and autonomic nervous system of lower limb, including hip: Secondary | ICD-10-CM | POA: Diagnosis not present

## 2022-01-17 DIAGNOSIS — L821 Other seborrheic keratosis: Secondary | ICD-10-CM | POA: Diagnosis not present

## 2022-02-27 ENCOUNTER — Other Ambulatory Visit: Payer: Self-pay | Admitting: Allergy and Immunology

## 2022-03-02 ENCOUNTER — Other Ambulatory Visit: Payer: Self-pay

## 2022-03-02 MED ORDER — TRAMADOL HCL 50 MG PO TABS: ORAL_TABLET | ORAL | 0 refills | Status: DC

## 2022-03-02 NOTE — Telephone Encounter (Signed)
Name of Medication: Tramadol  ?Name of Pharmacy: [facility] ?Last Fill or Written Date and Quantity: 12/01/21 #45 tabs with 0 refills   ?Last Office Visit and Type: CPE on 08/10/21 ? ?  ?

## 2022-03-02 NOTE — Telephone Encounter (Signed)
Patient's Daughter is calling about refill request for traMADol (ULTRAM) 50 MG tablet. [facility]. Please advise

## 2022-03-05 MED ORDER — ESOMEPRAZOLE MAGNESIUM 40 MG PO CPDR: DELAYED_RELEASE_CAPSULE | ORAL | 1 refills | Status: DC

## 2022-03-05 NOTE — Addendum Note (Signed)
Addended by: Tammi Sou on: 03/05/2022 03:54 PM ? ? Modules accepted: Orders ? ?

## 2022-03-05 NOTE — Telephone Encounter (Signed)
Pt daughter  [name] called in requesting to have Rx esomeprazole (NEXIUM) 40 MG capsule [facility] . stated insurance approved Nexium . Please advise [phone]  ?

## 2022-03-08 ENCOUNTER — Other Ambulatory Visit: Payer: Medicare Other | Admitting: Student

## 2022-03-08 ENCOUNTER — Telehealth: Payer: Self-pay | Admitting: Family Medicine

## 2022-03-08 ENCOUNTER — Other Ambulatory Visit: Payer: Self-pay

## 2022-03-08 DIAGNOSIS — R110 Nausea: Secondary | ICD-10-CM

## 2022-03-08 DIAGNOSIS — M1990 Unspecified osteoarthritis, unspecified site: Secondary | ICD-10-CM

## 2022-03-08 DIAGNOSIS — Z515 Encounter for palliative care: Secondary | ICD-10-CM

## 2022-03-08 DIAGNOSIS — F0390 Unspecified dementia without behavioral disturbance: Secondary | ICD-10-CM

## 2022-03-08 NOTE — Progress Notes (Signed)
? ? ?Manufacturing engineer ?Community Palliative Care Consult Note ?Telephone: [phone]  ?Fax: [phone]  ? ? ?Date of encounter: 03/08/22 ?10:47 AM ?PATIENT NAME: Ashlee Reyes ?[place] [address]   [phone] (home)  ?DOB: [birth year] ?MRN: [id number] ?PRIMARY CARE PROVIDER:    ?Tower, Wynelle Fanny, MD,  ?[address] [place] [address] ?[phone] ? ?REFERRING PROVIDER:   ?Tower, Wynelle Fanny, MD ?[address] [place] ?[place],  [place] [address] ?[phone] ? ?RESPONSIBLE PARTY:    ?Contact Information   ? ? Name Relation Home Work Mobile  ? [name] Daughter [phone]   ? Abreanna, Drawdy Son [phone]  ? Nicolini,Joyce Daughter [phone]  ? Dickson,Lorrie    [phone]  ? Dickson,John    [phone]  ? ?  ? ? ? ?I met face to face with patient and family in the home. Palliative Care was asked to follow this patient by consultation request of  Tower, Wynelle Fanny, MD to address advance care planning and complex medical decision making. This is a follow up visit. ? ?                                 ASSESSMENT AND PLAN / RECOMMENDATIONS:  ? ?Advance Care Planning/Goals of Care: Goals include to maximize quality of life and symptom management. Patient/health care surrogate gave his/her permission to discuss. ?Our advance care planning conversation included a discussion about:    ?The value and importance of advance care planning  ?Experiences with loved ones who have been seriously ill or have died  ?Exploration of personal, cultural or spiritual beliefs that might influence medical decisions  ?Exploration of goals of care in the event of a sudden injury or illness  ?CODE STATUS: DNR ? ?Education provided on Palliative Medicine. Palliative Medicine will continue to provide supportive care.  ? ?Symptom Management/Plan: ? ?Dementia-patient continues to require more cueing, direction, assistance with  adl's. Encourage use of walker with ambulation. Family to continue supportive care; reorient/redirect as needed. Monitor for falls/safety.  ? ?OA- Continue Tramadol QHS; acetaminophen PRN. ? ?Nausea-continue ondansetron PRN.  ? ?Follow up Palliative Care Visit: Palliative care will continue to follow for complex medical decision making, advance care planning, and clarification of goals. Return in 8 weeks or prn. ? ? ?This visit was coded based on medical decision making (MDM). ? ?PPS: 40% ? ?HOSPICE ELIGIBILITY/DIAGNOSIS: TBD ? ?Chief Complaint: Palliative Medicine follow up visit.  ? ?HISTORY OF PRESENT ILLNESS:  Ashlee Reyes is a 88 y.o. year old female  with dementia, hypertension, edema, dysphagia, OA, lumbar stenosis, IBS, hx of skin cancer.   ?  ?Resides at home with daughter. Has been doing well. Denies pain; endorses shortness of breath with exertion. Still with occasional nausea, takes Zofran with relief. Occasional headaches; takes tylenol with relief also takes tramadol QHS for OA pain. Daughter reports patient having good days and bad days. No recent infections. No recent ED visits or hospitalizations. A 10-point review of systems is negative, except for the pertinent positives and negatives detailed in the HPI.  ? ?History obtained from review of EMR, discussion with primary team, and interview with family, facility staff/caregiver and/or Ms. Markoff.  ?I reviewed available labs, medications, imaging, studies and related documents from the EMR.  Records reviewed and summarized above.  ? ? ?Physical Exam: ?Pulse 88, resp 16, b/p 152/72, sats 97% on  room air ?Constitutional: NAD ?General: frail appearing ?EYES: anicteric sclera, lids intact, no discharge  ?ENMT: slight hard of  hearing, oral mucous membranes moist, dentition intact ?CV: S1S2, RRR, no LE edema ?Pulmonary: LCTA, no increased work of breathing, no cough, room air ?Abdomen: normo-active BS + 4 quadrants, soft and non tender, no  ascites ?GU: deferred ?MSK: no sarcopenia, moves all extremities, ambulatory with rollator ?Skin: warm and dry, no rashes or wounds on visible skin ?Neuro: + generalized weakness ?Psych: non-anxious affect, A and O x to person, familiars, forgetful ?Hem/lymph/immuno: no widespread bruising ? ? ?Thank you for the opportunity to participate in the care of Ms. Spagnuolo.  The palliative care team will continue to follow. Please call our office at [phone] if we can be of additional assistance.  ? ?Ezekiel Slocumb, NP  ? ?COVID-19 PATIENT SCREENING TOOL ?Asked and negative response unless otherwise noted:  ? ?Have you had symptoms of covid, tested positive or been in contact with someone with symptoms/positive test in the past 5-10 days? No ? ?

## 2022-03-08 NOTE — Telephone Encounter (Signed)
?  Encourage patient to contact the pharmacy for refills or they can request refills through [facility] ? ?LAST APPOINTMENT DATE:  Please schedule appointment if longer than 1 year ? ?NEXT APPOINTMENT DATE: ? ?MEDICATION:ondansetron (ZOFRAN-ODT) 4 MG disintegrating tablet ? ?Is the patient out of medication?  ? ?[id number] [place] [place] [facility] [address]  ? ?Let patient know to contact pharmacy at the end of the day to make sure medication is ready. ? ?Please notify patient to allow 48-72 hours to process ? ?CLINICAL FILLS OUT ALL BELOW:  ? ?LAST REFILL: ? ?QTY: ? ?REFILL DATE: ? ? ? ?OTHER COMMENTS:  ? ? ?Okay for refill? ? ?Please advise ? ? ? ? ?

## 2022-03-09 MED ORDER — ONDANSETRON 4 MG PO TBDP: ORAL_TABLET | ORAL | 3 refills | Status: AC

## 2022-03-09 NOTE — Telephone Encounter (Signed)
Last filled on 09/08/20 #90 tabs with 3 refills, last CPE was on 08/10/22 ?

## 2022-03-19 ENCOUNTER — Telehealth: Payer: Self-pay | Admitting: *Deleted

## 2022-03-19 NOTE — Telephone Encounter (Signed)
Ashlee Reyes (daughter) called because it is going to assisted living temporarily for respite care. Ashlee Reyes said over the past year pt's memory is worsening about it's getting harder to care for mother so she would like her to go to assisted living temporarily for respite care. Ashlee Reyes said they did advise her that level of care has to say "[place]" on form and that pt can use the bathroom by herself unless she has a "blow out" and needs help getting cleaned up. Pt can also feed herself but that's about it she can't dress herself and would need help taking showers. Ashlee Reyes is aware that PCP is out of the office today and this will get addressed. Ashlee Reyes said they didn't give her a FL2 form so the generic forms we keep in the office is fine to use  ?

## 2022-03-20 NOTE — Telephone Encounter (Signed)
I wrote in long term care  ?Let me know if I need to check anything else ?In IN box  ?

## 2022-03-20 NOTE — Telephone Encounter (Signed)
Patient's mom is requesting to pick-up FL2 at 2:10 pm,  ? ?Patient is going back tomorrow around 8 or 9 and could also take it then ? ?Please call the patient when it is ready [phone] ?

## 2022-03-20 NOTE — Telephone Encounter (Signed)
[name] notified form ready for pick up ?

## 2022-03-21 NOTE — Telephone Encounter (Signed)
[name] said the [facility] Form we filled out was the wrong one, she got the correct one from the facility and I have placed in PCP's inbox for review  ?

## 2022-03-22 NOTE — Telephone Encounter (Signed)
Done and in IN box 

## 2022-03-27 NOTE — Telephone Encounter (Signed)
Daughter notified form ready for pick up 

## 2022-04-03 ENCOUNTER — Telehealth: Payer: Self-pay | Admitting: Family Medicine

## 2022-04-03 DIAGNOSIS — H0100B Unspecified blepharitis left eye, upper and lower eyelids: Secondary | ICD-10-CM | POA: Diagnosis not present

## 2022-04-03 DIAGNOSIS — H0100A Unspecified blepharitis right eye, upper and lower eyelids: Secondary | ICD-10-CM | POA: Diagnosis not present

## 2022-04-03 DIAGNOSIS — H401133 Primary open-angle glaucoma, bilateral, severe stage: Secondary | ICD-10-CM | POA: Diagnosis not present

## 2022-04-03 NOTE — Telephone Encounter (Signed)
Pt's daughter called about this medication: Tramadol (50 mg), she has not ran out of pills yet but she has 9 pills total. The daughter stated they have a new insurance company, theres an exception for the medication. She wants to know if she can talk about getting more pills when the refill is needed but not too late.  ?

## 2022-04-03 NOTE — Telephone Encounter (Signed)
It looks like she may need a refill now, correct?  ?Does her insurance limit how many pills per month ? ? ?The state watches our px for this medicine and also watches the pharmacists because it is a controlled substance  (the pharmacist can also decide when is too early to fill)  ?Generally we cannot refill more than 2-3 days before it is due to run out  ? ?How many days before she runs out is she interested in getting it ?     ? ?Thanks  ?

## 2022-04-06 NOTE — Telephone Encounter (Signed)
Spoke with daughter [name] and her issue was pt has new insurance and a new pharmacy and when she went to pick up the tramadol they only gave her 9 pills for the 1st refill and then had to go back and get the 36 additional pills later. She just wanted to make sure that it doesn't happen going forward and she can get the full 45 pills. I advise her that usually since it's a control sub the 1st refill is limited but going forward she should be okay and they will give her the full 45 tabs. Pt has about 10 days left on this refill. I advise her if this happens the 2nd time she gets it refilled to let us know. Pt verbalized understanding  ?

## 2022-04-15 ENCOUNTER — Other Ambulatory Visit: Payer: Self-pay | Admitting: Family Medicine

## 2022-04-16 NOTE — Telephone Encounter (Signed)
Is this okay to refill? 

## 2022-05-02 ENCOUNTER — Other Ambulatory Visit: Payer: Self-pay | Admitting: Family Medicine

## 2022-05-02 NOTE — Telephone Encounter (Signed)
Per pharmacy pt's insurance will only cover 1 caps daily dosing not BID, please advise  ?

## 2022-05-03 NOTE — Telephone Encounter (Signed)
Ashlee Reyes said nothing really controls her GERD but she's been having the same issues with it for over 30 yrs. Ashlee Reyes said they will try the once daily dosing for a while and if it doesn't seem to be controlling her GERD she will call us back and let us know. Rx sent to pharmacy

## 2022-05-07 ENCOUNTER — Other Ambulatory Visit: Payer: Medicare Other | Admitting: Student

## 2022-05-07 DIAGNOSIS — I10 Essential (primary) hypertension: Secondary | ICD-10-CM | POA: Diagnosis not present

## 2022-05-07 DIAGNOSIS — Z515 Encounter for palliative care: Secondary | ICD-10-CM | POA: Diagnosis not present

## 2022-05-07 DIAGNOSIS — R110 Nausea: Secondary | ICD-10-CM | POA: Diagnosis not present

## 2022-05-07 DIAGNOSIS — R609 Edema, unspecified: Secondary | ICD-10-CM | POA: Diagnosis not present

## 2022-05-07 DIAGNOSIS — F0390 Unspecified dementia without behavioral disturbance: Secondary | ICD-10-CM | POA: Diagnosis not present

## 2022-05-07 NOTE — Progress Notes (Signed)
[facility] Palliative Care Consult Note Telephone: [phone]  Fax: [phone]    Date of encounter: 05/07/22 3:05 PM PATIENT NAME: Randall Rampersad [place] [address]   [phone] (home)  DOB: 1921-05-03 MRN: [id number] PRIMARY CARE PROVIDER:    Abner Greenspan, MD,  [place] [address] [phone]  REFERRING PROVIDER:   Abner Greenspan, MD [address] [place],  [place] [address] [phone]  RESPONSIBLE PARTY:    Contact Information     Name Relation Home Work Lynn Daughter [phone]    Kattaleya, Alia [phone]   Sianna, Garofano Daughter [phone]   Dickson,Lorrie    [phone]   Charisse March    [phone]        I met face to face with patient and family in the home. Palliative Care was asked to follow this patient by consultation request of  Tower, Wynelle Fanny, MD to address advance care planning and complex medical decision making. This is a follow up visit.                                   ASSESSMENT AND PLAN / RECOMMENDATIONS:   Advance Care Planning/Goals of Care: Goals include to maximize quality of life and symptom management. Patient/health care surrogate gave his/her permission to discuss. Our advance care planning conversation included a discussion about:    The value and importance of advance care planning  Experiences with loved ones who have been seriously ill or have died  Exploration of personal, cultural or spiritual beliefs that might influence medical decisions  Exploration of goals of care in the event of a sudden injury or illness  CODE STATUS: DNR  Education provided on Palliative Medicine. Will continue to monitor for changes, declines, provide symptom management. Daughter would like for patient to remain in the home.   Symptom  Management/Plan:  Dementia-continue to reorient and redirect as needed. Assist with adl's as needed. Encourage walker for ambulation. Monitor for falls/safety.  Hypertension-patient's blood pressure has been elevated recently; she has PRN order amlodipine 2.5 mg if b/p is 145/90 or greater. Refill sent to pharmacy; daughter is to give two tablets to = 5 mg if systolic is 223 or greater. Continue to check blood pressure daily.    Edema- mild edema, elevate legs, wear compression socks daily. May be due to using amlodipine more frequently; will monitor for worsening.   Nausea- continue ondansetron PRN.  Follow up Palliative Care Visit: Palliative care will continue to follow for complex medical decision making, advance care planning, and clarification of goals. Return in 8 weeks or prn.   This visit was coded based on medical decision making (MDM).  PPS: 40%  HOSPICE ELIGIBILITY/DIAGNOSIS: TBD  Chief Complaint: Palliative Medicine follow up visit.   HISTORY OF PRESENT ILLNESS:  Annessa Satre is a 88 y.o. year old female  with  dementia, hypertension, edema, dysphagia, OA, lumbar stenosis, IBS, hx of skin cancer.   Patients blood pressure was elevated on this past Friday 361'Q systolic; daughter admin amlodipine. Her blood pressures have been elevated 244'L-753 systolic recently. She does complain of pain, usually left knee pain. Receives acetaminophen and tramadol with relief. No falls. Her appetite remains good. Still complaint of occasional nausea; ondansetron PRN. No constipation reported. She is more forgetful; she is also humming  more but does not realize she is doing this. Went to [facility] x 4 days for respite since last palliative visit.   History obtained from review of EMR, discussion with primary team, and interview with family, facility staff/caregiver and/or Ms. Federer.  I reviewed available labs, medications, imaging, studies and related documents from the EMR.   Records reviewed and summarized above.   ROS  Obtained from daughter d/t her dementia  Physical Exam: Pulse 78, resp 16, b/p 150/72, sats 96% on room air Constitutional: NAD General: frail appearing EYES: anicteric sclera, lids intact, no discharge  ENMT: intact hearing, oral mucous membranes moist, dentition intact CV: S1S2, RRR, 1+ LE edema Pulmonary: LCTA, no increased work of breathing, no cough, room air Abdomen: normo-active BS + 4 quadrants, soft and non tender, no ascites GU: deferred MSK: moves all extremities, ambulatory Skin: warm and dry, no rashes or wounds on visible skin Neuro:  generalized weakness, A & O x to self, familiars Psych: non-anxious affect, pleasant Hem/lymph/immuno: no widespread bruising   Thank you for the opportunity to participate in the care of Ms. Kuck.  The palliative care team will continue to follow. Please call our office at [phone] if we can be of additional assistance.   Ezekiel Slocumb, NP   COVID-19 PATIENT SCREENING TOOL Asked and negative response unless otherwise noted:   Have you had symptoms of covid, tested positive or been in contact with someone with symptoms/positive test in the past 5-10 days? No

## 2022-06-03 ENCOUNTER — Other Ambulatory Visit: Payer: Self-pay | Admitting: Allergy and Immunology

## 2022-06-03 DIAGNOSIS — J3089 Other allergic rhinitis: Secondary | ICD-10-CM

## 2022-06-29 ENCOUNTER — Other Ambulatory Visit: Payer: Self-pay | Admitting: Family Medicine

## 2022-06-29 NOTE — Telephone Encounter (Signed)
Last filled 05/22/22  Pt is under palliative care

## 2022-07-02 DIAGNOSIS — Z85828 Personal history of other malignant neoplasm of skin: Secondary | ICD-10-CM | POA: Diagnosis not present

## 2022-07-02 DIAGNOSIS — D3611 Benign neoplasm of peripheral nerves and autonomic nervous system of face, head, and neck: Secondary | ICD-10-CM | POA: Diagnosis not present

## 2022-07-02 DIAGNOSIS — L820 Inflamed seborrheic keratosis: Secondary | ICD-10-CM | POA: Diagnosis not present

## 2022-07-02 DIAGNOSIS — L821 Other seborrheic keratosis: Secondary | ICD-10-CM | POA: Diagnosis not present

## 2022-07-09 ENCOUNTER — Other Ambulatory Visit: Payer: Medicare Other | Admitting: Student

## 2022-07-09 DIAGNOSIS — Z515 Encounter for palliative care: Secondary | ICD-10-CM

## 2022-07-09 DIAGNOSIS — R600 Localized edema: Secondary | ICD-10-CM | POA: Diagnosis not present

## 2022-07-09 DIAGNOSIS — F0390 Unspecified dementia without behavioral disturbance: Secondary | ICD-10-CM

## 2022-07-09 NOTE — Progress Notes (Signed)
[facility]rap[facility], [facility] Palliative Care Consult Note Telephone: [phone]  Fax: [phone]    Date of encounter: 07/09/22 3:12 PM PATIENT NAME: Ashlee Reyes [address] [place] [address]   [phone] (home)  DOB: 29-Mar-1921 MRN: [id number] PRIMARY CARE PROVIDER:    Judy Pimple, MD,  [address] [place] [address] [phone]  REFERRING PROVIDER:   Judy Pimple, MD [address] [place],  [place] [address] [phone]  RESPONSIBLE PARTY:    Contact Information     Name Relation Home Work [place] Daughter [phone]    Landyn, Lorincz [phone]   Caylor, Cerino Daughter [phone]   Dickson,Lorrie    [phone]   Phoebe Perch    [phone]        I met face to face with patient and family in the home. Palliative Care was asked to follow this patient by consultation request of  Tower, Audrie Gallus, MD to address advance care planning and complex medical decision making. This is a follow up visit.                                   ASSESSMENT AND PLAN / RECOMMENDATIONS:   Advance Care Planning/Goals of Care: Goals include to maximize quality of life and symptom management. Patient/health care surrogate gave his/her permission to discuss. Our advance care planning conversation included a discussion about:    The value and importance of advance care planning  Experiences with loved ones who have been seriously ill or have died  Exploration of personal, cultural or spiritual beliefs that might influence medical decisions  Exploration of goals of care in the event of a sudden injury or illness  CODE STATUS: DNR  Education provided on Palliative Medicine. Will continue to provide symptom management, monitor for changes/declines. Patient would go to hospital should needs arise; attempt to manage in the home if possible.    Symptom Management/Plan:  Dementia-patient with increased forgetfulness. She did have one episode where she had increased confusion, lasted about 2 minutes and returned to baseline. No other symptoms? TIA. Daughter will continue to monitor for worsening symptoms. She is having increased difficulty with some pills. Recommend giving in yogurt or applesauce. She is having more difficulty with bathing. Continue to reorient and redirect as needed. Assist with adl's as needed. Encourage walker for ambulation. Monitor for falls/safety.  Edema- mild edema.Received new compression socks. Encourage to wear daily, elevate legs.   Follow up Palliative Care Visit: Palliative care will continue to follow for complex medical decision making, advance care planning, and clarification of goals. Return in 8 weeks or prn.   This visit was coded based on medical decision making (MDM).  PPS: 40%  HOSPICE ELIGIBILITY/DIAGNOSIS: TBD  Chief Complaint: Palliative Medicine follow up visit.   HISTORY OF PRESENT ILLNESS:  Ashlee Reyes is a 88 y.o. year old female  with dementia, hypertension, edema, dysphagia, OA, lumbar stenosis, IBS, hx of skin cancer.   Patient reports doing okay. Daughter states she has been stable except she did have one episode where she had increased confusion, difficulty following directions. This lasted about 2 minutes and returned to baseline. No other symptoms reported. Patient with increased forgetfulness. She does not recall two long time pets that were in the home, that recently passed away. Blood pressure have been 110's-140's systolic/ 70's systolic.  Occasional nausea, giving ondansetron. Eating well. Increased difficulty swallowing some pills. Uses walker for ambulation. No falls. Patient answers direct questions; HPI and ROS primarily obtained from daughter.   History obtained from review of EMR, discussion with primary team, and interview with family, facility staff/caregiver  and/or Ms. Kunst.  I reviewed available labs, medications, imaging, studies and related documents from the EMR.  Records reviewed and summarized above.   ROS  Obtained per family d/t her dementia.   Physical Exam:  Pulse 70, b/p 156/82, recheck 138/68, sats 97% on room air Constitutional: NAD General: frail appearing EYES: anicteric sclera, lids intact, no discharge  ENMT: intact hearing, oral mucous membranes moist, dentition intact CV: S1S2, RRR, no LE edema Pulmonary: LCTA, no increased work of breathing, no cough, room air Abdomen: normo-active BS + 4 quadrants, soft and non tender, no ascites GU: deferred MSK: moves all extremities, ambulatory Skin: warm and dry, no rashes or wounds on visible skin Neuro: +generalized weakness, A & O to person, familiars Psych: non-anxious affect, pleasant Hem/lymph/immuno: no widespread bruising   Thank you for the opportunity to participate in the care of [name].  The palliative care team will continue to follow. Please call our office at [phone] if we can be of additional assistance.   Ezekiel Slocumb, NP   COVID-19 PATIENT SCREENING TOOL Asked and negative response unless otherwise noted:   Have you had symptoms of covid, tested positive or been in contact with someone with symptoms/positive test in the past 5-10 days? No

## 2022-07-10 ENCOUNTER — Ambulatory Visit (INDEPENDENT_AMBULATORY_CARE_PROVIDER_SITE_OTHER): Payer: Medicare Other | Admitting: Podiatry

## 2022-07-10 DIAGNOSIS — B351 Tinea unguium: Secondary | ICD-10-CM

## 2022-07-10 DIAGNOSIS — M79674 Pain in right toe(s): Secondary | ICD-10-CM

## 2022-07-10 DIAGNOSIS — M79675 Pain in left toe(s): Secondary | ICD-10-CM | POA: Diagnosis not present

## 2022-07-10 NOTE — Progress Notes (Signed)
   SUBJECTIVE Patient presents to office today complaining of elongated, thickened nails that cause pain while ambulating in shoes.  Patient is unable to trim their own nails. Patient is here for further evaluation and treatment.  Past Medical History:  Diagnosis Date   AAA (abdominal aortic aneurysm) ([place])    followed by Dr Oneida Alar. Last u/s07/10 stable AAA with largest measurement 3.97 x 4.02cm.   Allergic rhinitis, seasonal    Asthma    Atrial fibrillation ([facility])    CAD (coronary artery disease) 07/2009   mod by cath 45/40   Complication of anesthesia    Depression    Disc disease, degenerative, lumbar or lumbosacral    SPINAL STENOSIS   Diverticulosis    GERD (gastroesophageal reflux disease)    Hemorrhoids    History of recurrent UTIs    Hyperlipidemia    IBS (irritable bowel syndrome)    IC (irritable colon)    DMSO in past   Interstitial cystitis    Osteoarthritis    spine/spinal stenosis   Osteopenia 2011   PONV (postoperative nausea and vomiting)    Skin cancer    basal cell cancer - face.    OBJECTIVE General Patient is awake, alert, and oriented x 3 and in no acute distress. Derm Skin is dry and supple bilateral. Negative open lesions or macerations. Remaining integument unremarkable. Nails are tender, long, thickened and dystrophic with subungual debris, consistent with onychomycosis, 1-5 bilateral. No signs of infection noted. Vasc  DP and PT pedal pulses palpable bilaterally. Temperature gradient within normal limits.  Neuro Epicritic and protective threshold sensation grossly intact bilaterally.  Musculoskeletal Exam No symptomatic pedal deformities noted bilateral. Muscular strength within normal limits.  ASSESSMENT 1.  Pain due to onychomycosis of toenails both  PLAN OF CARE 1. Patient evaluated today.  2. Instructed to maintain good pedal hygiene and foot care.  3. Mechanical debridement of nails 1-5 bilaterally performed using a nail nipper. Filed with  dremel without incident.  4. Return to clinic in 3 mos.    Edrick Kins, DPM [facility]  Dr. Edrick Kins, DPM    [address], [place] [address]                Office [phone]  Fax [phone]

## 2022-07-26 ENCOUNTER — Emergency Department: Payer: Medicare Other

## 2022-07-26 ENCOUNTER — Other Ambulatory Visit: Payer: Self-pay

## 2022-07-26 ENCOUNTER — Observation Stay
Admission: EM | Admit: 2022-07-26 | Discharge: 2022-07-28 | Disposition: A | Discharge status: Skilled Nursing Facility | Payer: Medicare Other | Attending: Family Medicine | Admitting: Family Medicine

## 2022-07-26 DIAGNOSIS — S3992XA Unspecified injury of lower back, initial encounter: Principal | ICD-10-CM | POA: Insufficient documentation

## 2022-07-26 DIAGNOSIS — K5730 Diverticulosis of large intestine without perforation or abscess without bleeding: Secondary | ICD-10-CM | POA: Diagnosis not present

## 2022-07-26 DIAGNOSIS — Z9049 Acquired absence of other specified parts of digestive tract: Secondary | ICD-10-CM | POA: Diagnosis not present

## 2022-07-26 DIAGNOSIS — K439 Ventral hernia without obstruction or gangrene: Secondary | ICD-10-CM | POA: Diagnosis not present

## 2022-07-26 DIAGNOSIS — R338 Other retention of urine: Secondary | ICD-10-CM

## 2022-07-26 DIAGNOSIS — Z85828 Personal history of other malignant neoplasm of skin: Secondary | ICD-10-CM | POA: Insufficient documentation

## 2022-07-26 DIAGNOSIS — M48061 Spinal stenosis, lumbar region without neurogenic claudication: Secondary | ICD-10-CM | POA: Diagnosis not present

## 2022-07-26 DIAGNOSIS — Z7902 Long term (current) use of antithrombotics/antiplatelets: Secondary | ICD-10-CM | POA: Diagnosis not present

## 2022-07-26 DIAGNOSIS — I679 Cerebrovascular disease, unspecified: Secondary | ICD-10-CM

## 2022-07-26 DIAGNOSIS — I4891 Unspecified atrial fibrillation: Secondary | ICD-10-CM | POA: Diagnosis not present

## 2022-07-26 DIAGNOSIS — I672 Cerebral atherosclerosis: Secondary | ICD-10-CM | POA: Diagnosis not present

## 2022-07-26 DIAGNOSIS — S199XXA Unspecified injury of neck, initial encounter: Secondary | ICD-10-CM | POA: Diagnosis not present

## 2022-07-26 DIAGNOSIS — Z95828 Presence of other vascular implants and grafts: Secondary | ICD-10-CM | POA: Diagnosis not present

## 2022-07-26 DIAGNOSIS — M545 Low back pain, unspecified: Secondary | ICD-10-CM | POA: Diagnosis not present

## 2022-07-26 DIAGNOSIS — Y92000 Kitchen of unspecified non-institutional (private) residence as  the place of occurrence of the external cause: Secondary | ICD-10-CM | POA: Diagnosis not present

## 2022-07-26 DIAGNOSIS — Y92009 Unspecified place in unspecified non-institutional (private) residence as the place of occurrence of the external cause: Secondary | ICD-10-CM | POA: Diagnosis not present

## 2022-07-26 DIAGNOSIS — W19XXXA Unspecified fall, initial encounter: Secondary | ICD-10-CM

## 2022-07-26 DIAGNOSIS — F0390 Unspecified dementia without behavioral disturbance: Secondary | ICD-10-CM | POA: Diagnosis not present

## 2022-07-26 DIAGNOSIS — Z79899 Other long term (current) drug therapy: Secondary | ICD-10-CM | POA: Diagnosis not present

## 2022-07-26 DIAGNOSIS — S0990XA Unspecified injury of head, initial encounter: Secondary | ICD-10-CM | POA: Diagnosis not present

## 2022-07-26 DIAGNOSIS — I358 Other nonrheumatic aortic valve disorders: Secondary | ICD-10-CM | POA: Diagnosis not present

## 2022-07-26 DIAGNOSIS — R269 Unspecified abnormalities of gait and mobility: Secondary | ICD-10-CM | POA: Insufficient documentation

## 2022-07-26 DIAGNOSIS — W010XXA Fall on same level from slipping, tripping and stumbling without subsequent striking against object, initial encounter: Secondary | ICD-10-CM | POA: Insufficient documentation

## 2022-07-26 DIAGNOSIS — R4182 Altered mental status, unspecified: Secondary | ICD-10-CM | POA: Diagnosis not present

## 2022-07-26 DIAGNOSIS — N3010 Interstitial cystitis (chronic) without hematuria: Secondary | ICD-10-CM | POA: Diagnosis present

## 2022-07-26 DIAGNOSIS — J45909 Unspecified asthma, uncomplicated: Secondary | ICD-10-CM | POA: Insufficient documentation

## 2022-07-26 DIAGNOSIS — I2510 Atherosclerotic heart disease of native coronary artery without angina pectoris: Secondary | ICD-10-CM | POA: Diagnosis not present

## 2022-07-26 DIAGNOSIS — K429 Umbilical hernia without obstruction or gangrene: Secondary | ICD-10-CM | POA: Diagnosis not present

## 2022-07-26 DIAGNOSIS — E785 Hyperlipidemia, unspecified: Secondary | ICD-10-CM | POA: Diagnosis present

## 2022-07-26 DIAGNOSIS — I10 Essential (primary) hypertension: Secondary | ICD-10-CM | POA: Diagnosis not present

## 2022-07-26 DIAGNOSIS — S299XXA Unspecified injury of thorax, initial encounter: Secondary | ICD-10-CM | POA: Diagnosis not present

## 2022-07-26 DIAGNOSIS — M549 Dorsalgia, unspecified: Secondary | ICD-10-CM | POA: Diagnosis not present

## 2022-07-26 DIAGNOSIS — R918 Other nonspecific abnormal finding of lung field: Secondary | ICD-10-CM | POA: Diagnosis not present

## 2022-07-26 DIAGNOSIS — I714 Abdominal aortic aneurysm, without rupture, unspecified: Secondary | ICD-10-CM | POA: Diagnosis present

## 2022-07-26 DIAGNOSIS — J341 Cyst and mucocele of nose and nasal sinus: Secondary | ICD-10-CM | POA: Diagnosis not present

## 2022-07-26 LAB — URINALYSIS, ROUTINE W REFLEX MICROSCOPIC
Bacteria, UA: NONE SEEN
Bilirubin Urine: NEGATIVE
Glucose, UA: NEGATIVE mg/dL
Ketones, ur: NEGATIVE mg/dL
Leukocytes,Ua: NEGATIVE
Nitrite: NEGATIVE
Protein, ur: NEGATIVE mg/dL
Specific Gravity, Urine: 1.018 (ref 1.005–1.030)
Squamous Epithelial / HPF: NONE SEEN (ref 0–5)
pH: 7 (ref 5.0–8.0)

## 2022-07-26 LAB — COMPREHENSIVE METABOLIC PANEL
ALT: 12 U/L (ref 0–44)
AST: 20 U/L (ref 15–41)
Albumin: 3.7 g/dL (ref 3.5–5.0)
Alkaline Phosphatase: 57 U/L (ref 38–126)
Anion gap: 8 (ref 5–15)
BUN: 15 mg/dL (ref 8–23)
CO2: 25 mmol/L (ref 22–32)
Calcium: 9 mg/dL (ref 8.9–10.3)
Chloride: 103 mmol/L (ref 98–111)
Creatinine, Ser: 0.99 mg/dL (ref 0.44–1.00)
GFR, Estimated: 52 mL/min — ABNORMAL LOW (ref >60)
Glucose, Bld: 139 mg/dL — ABNORMAL HIGH (ref 70–99)
Potassium: 4 mmol/L (ref 3.5–5.1)
Sodium: 136 mmol/L (ref 135–145)
Total Bilirubin: 0.7 mg/dL (ref 0.3–1.2)
Total Protein: 7 g/dL (ref 6.5–8.1)

## 2022-07-26 LAB — CBC WITH DIFFERENTIAL/PLATELET
Abs Immature Granulocytes: 0.14 10*3/uL — ABNORMAL HIGH (ref 0.00–0.07)
Basophils Absolute: 0.1 10*3/uL (ref 0.0–0.1)
Basophils Relative: 1 %
Eosinophils Absolute: 0.1 10*3/uL (ref 0.0–0.5)
Eosinophils Relative: 1 %
HCT: 39.5 % (ref 36.0–46.0)
Hemoglobin: 12.5 g/dL (ref 12.0–15.0)
Immature Granulocytes: 1 %
Lymphocytes Relative: 15 %
Lymphs Abs: 1.5 10*3/uL (ref 0.7–4.0)
MCH: 27.8 pg (ref 26.0–34.0)
MCHC: 31.6 g/dL (ref 30.0–36.0)
MCV: 87.8 fL (ref 80.0–100.0)
Monocytes Absolute: 0.8 10*3/uL (ref 0.1–1.0)
Monocytes Relative: 8 %
Neutro Abs: 7.3 10*3/uL (ref 1.7–7.7)
Neutrophils Relative %: 74 %
Platelets: 267 10*3/uL (ref 150–400)
RBC: 4.5 MIL/uL (ref 3.87–5.11)
RDW: 14.4 % (ref 11.5–15.5)
WBC: 10 10*3/uL (ref 4.0–10.5)
nRBC: 0 % (ref 0.0–0.2)

## 2022-07-26 LAB — TROPONIN I (HIGH SENSITIVITY)
Troponin I (High Sensitivity): 9 ng/L (ref <18)
Troponin I (High Sensitivity): 17 ng/L (ref <18)

## 2022-07-26 LAB — APTT: aPTT: 28 seconds (ref 24–36)

## 2022-07-26 LAB — PROTIME-INR
INR: 1 (ref 0.8–1.2)
Prothrombin Time: 13.4 seconds (ref 11.4–15.2)

## 2022-07-26 MED ORDER — LORAZEPAM 2 MG/ML IJ SOLN
INTRAMUSCULAR | Status: DC
Start: 2022-07-26 — End: 2022-07-26
  Filled 2022-07-26: qty 1

## 2022-07-26 MED ORDER — DROPERIDOL 2.5 MG/ML IJ SOLN
2.5000 mg | Freq: Once | INTRAMUSCULAR | Status: AC
  Administered 2022-07-26: 2.5 mg via INTRAVENOUS

## 2022-07-26 MED ORDER — HYDROCODONE-ACETAMINOPHEN 5-325 MG PO TABS
1.0000 | ORAL_TABLET | ORAL | Status: DC | PRN
  Administered 2022-07-27 – 2022-07-28 (×2): 1 via ORAL
  Filled 2022-07-26 (×2): qty 1

## 2022-07-26 MED ORDER — ENOXAPARIN SODIUM 40 MG/0.4ML IJ SOSY
40.0000 mg | PREFILLED_SYRINGE | INTRAMUSCULAR | Status: DC
Start: 2022-07-27 — End: 2022-07-28
  Administered 2022-07-27 – 2022-07-28 (×2): 40 mg via SUBCUTANEOUS
  Filled 2022-07-26 (×2): qty 0.4

## 2022-07-26 MED ORDER — ACETAMINOPHEN 650 MG RE SUPP: 650.0000 mg | Freq: Four times a day (QID) | RECTAL | Status: DC | PRN

## 2022-07-26 MED ORDER — ONDANSETRON HCL 4 MG PO TABS: 4.0000 mg | ORAL_TABLET | Freq: Four times a day (QID) | ORAL | Status: DC | PRN

## 2022-07-26 MED ORDER — TRAZODONE HCL 50 MG PO TABS
25.0000 mg | ORAL_TABLET | Freq: Every evening | ORAL | Status: DC | PRN
  Administered 2022-07-27: 25 mg via ORAL
  Filled 2022-07-26: qty 1

## 2022-07-26 MED ORDER — LIDOCAINE 5 % EX PTCH
1.0000 | MEDICATED_PATCH | CUTANEOUS | Status: DC
Start: 2022-07-26 — End: 2022-07-28
  Administered 2022-07-26 – 2022-07-27 (×2): 1 via TRANSDERMAL
  Filled 2022-07-26 (×2): qty 1

## 2022-07-26 MED ORDER — MORPHINE SULFATE (PF) 2 MG/ML IV SOLN
1.0000 mg | Freq: Four times a day (QID) | INTRAVENOUS | Status: DC | PRN
  Administered 2022-07-27: 1 mg via INTRAVENOUS
  Filled 2022-07-26: qty 1

## 2022-07-26 MED ORDER — ACETAMINOPHEN 325 MG PO TABS
650.0000 mg | ORAL_TABLET | Freq: Four times a day (QID) | ORAL | Status: DC | PRN
  Administered 2022-07-27 – 2022-07-28 (×2): 650 mg via ORAL
  Filled 2022-07-26 (×2): qty 2

## 2022-07-26 MED ORDER — IOHEXOL 350 MG/ML SOLN
75.0000 mL | Freq: Once | INTRAVENOUS | Status: AC | PRN
  Administered 2022-07-26: 75 mL via INTRAVENOUS

## 2022-07-26 MED ORDER — SODIUM CHLORIDE 0.9% FLUSH
3.0000 mL | Freq: Two times a day (BID) | INTRAVENOUS | Status: DC
  Administered 2022-07-27 – 2022-07-28 (×4): 3 mL via INTRAVENOUS

## 2022-07-26 MED ORDER — SENNOSIDES-DOCUSATE SODIUM 8.6-50 MG PO TABS: 1.0000 | ORAL_TABLET | Freq: Every evening | ORAL | Status: DC | PRN

## 2022-07-26 MED ORDER — DROPERIDOL 2.5 MG/ML IJ SOLN
2.5000 mg | Freq: Once | INTRAMUSCULAR | Status: AC
  Administered 2022-07-26: 2.5 mg via INTRAVENOUS
  Filled 2022-07-26: qty 2

## 2022-07-26 MED ORDER — BISACODYL 5 MG PO TBEC: 5.0000 mg | DELAYED_RELEASE_TABLET | Freq: Every day | ORAL | Status: DC | PRN

## 2022-07-26 MED ORDER — ONDANSETRON HCL 4 MG/2ML IJ SOLN
4.0000 mg | Freq: Four times a day (QID) | INTRAMUSCULAR | Status: DC | PRN
  Administered 2022-07-27: 4 mg via INTRAVENOUS
  Filled 2022-07-26: qty 2

## 2022-07-26 MED ORDER — LORAZEPAM 2 MG/ML IJ SOLN
1.0000 mg | Freq: Once | INTRAMUSCULAR | Status: AC
  Administered 2022-07-26: 1 mg via INTRAVENOUS
  Filled 2022-07-26: qty 1

## 2022-07-26 MED ORDER — HYDRALAZINE HCL 20 MG/ML IJ SOLN: 2.0000 mg | Freq: Four times a day (QID) | INTRAMUSCULAR | Status: DC | PRN

## 2022-07-26 MED ORDER — LORAZEPAM 2 MG/ML IJ SOLN: 0.5000 mg | Freq: Once | INTRAMUSCULAR | Status: DC

## 2022-07-26 MED ORDER — ACETAMINOPHEN 325 MG PO TABS
650.0000 mg | ORAL_TABLET | Freq: Once | ORAL | Status: AC
  Administered 2022-07-26: 650 mg via ORAL
  Filled 2022-07-26: qty 2

## 2022-07-26 MED ORDER — LORAZEPAM 2 MG/ML IJ SOLN
0.5000 mg | Freq: Once | INTRAMUSCULAR | Status: AC
  Administered 2022-07-26: 0.5 mg via INTRAVENOUS

## 2022-07-26 NOTE — ED Provider Notes (Signed)
[facility] Provider Note    Event Date/Time   First MD Initiated Contact with Patient 07/26/22 1536     (approximate)   History   Fall   HPI  Ashlee Reyes is a 88 y.o. female with a past medical history of dementia, hyperlipidemia, frequent falls, osteopenia, coronary artery disease, spinal stenosis who presents for evaluation of a unwitnessed mechanical fall.  Patient presents with daughter who reports that she thinks that the patient stepped on a water bowl in the kitchen and fell.  Daughter reports that the caretaker was at home, and patient was on the ground only for a matter of seconds.  She reports that EMS was called and they came within minutes.  Patient reports that she has pain in her left low back only.  She has not had any vomiting.  Daughter reports that she has not attempted to ambulate.  Patient Active Problem List   Diagnosis Date Noted   Dementia, old age ([place]) 08/10/2021   Thickened nails 08/10/2021   Dysphagia 03/21/2021   Vitamin B12 deficiency 10/12/2020   Nausea 09/08/2020   Fatigue 09/08/2020   Current use of proton pump inhibitor 09/08/2020   Labile blood pressure 07/31/2019   Allergic rhinitis 02/20/2019   Palliative care status 01/12/2019   Hypertensive urgency 12/24/2018   History of compression fracture of spine 09/26/2018   Mobility impaired 09/26/2018   History of endovascular stent graft for abdominal aortic aneurysm (AAA) 01/01/2018   Unilateral primary osteoarthritis, left knee 07/11/2017   Hyperlipidemia 03/12/2017   Frequent UTI/interstitial cystitis 02/06/2016   Hypotension 01/09/2016   Carotid stenosis 06/01/2015   Local reaction to immunization 02/21/2015   Encounter for Medicare annual wellness exam 02/15/2015   Estrogen deficiency 02/15/2015   Sinusitis, chronic 01/12/2015   IBS (irritable bowel syndrome) 01/12/2015   Orthostatic hypotension 01/11/2015   Palpitations 05/13/2014   Second degree  heart block 10/18/2013   Aneurysm of abdominal vessel ([place]) 06/25/2013   Osteopenia 01/13/2013   CAD (coronary artery disease)    Chronic depression 06/03/2007   GERD 06/02/2007   Asthma    Chronic interstitial cystitis    Osteoarthritis    Spinal stenosis of lumbar region    History of urinary tract infection           Physical Exam   Triage Vital Signs: ED Triage Vitals  Enc Vitals Group     BP 07/26/22 1406 133/72     Pulse Rate 07/26/22 1406 63     Resp 07/26/22 1406 14     Temp 07/26/22 1406 98.5 F (36.9 C)     Temp Source 07/26/22 1406 Oral     SpO2 07/26/22 1406 95 %     Weight 07/26/22 1407 175 lb (79.4 kg)     Height 07/26/22 1407 '5\' 7"'$  (1.702 m)     Head Circumference --      Peak Flow --      Pain Score --      Pain Loc --      Pain Edu? --      Excl. in [place]? --     Most recent vital signs: Vitals:   07/26/22 1842 07/26/22 2110  BP:  (!) 143/81  Pulse:  (!) 106  Resp:  18  Temp: 98.5 F (36.9 C) 98.9 F (37.2 C)  SpO2:  98%    Physical Exam Vitals and nursing note reviewed.  Constitutional:      General: Awake and  alert. No acute distress.    Appearance: Normal appearance. The patient is normal weight.  HENT:     Head: Normocephalic and atraumatic.     Mouth: Mucous membranes are moist.  Eyes:     General: PERRL. Normal EOMs        Right eye: No discharge.        Left eye: No discharge.     Conjunctiva/sclera: Conjunctivae normal.  Cardiovascular:     Rate and Rhythm: Normal rate and regular rhythm.     Pulses: Normal pulses.     Heart sounds: Normal heart sounds Pulmonary:     Effort: Pulmonary effort is normal. No respiratory distress.     Breath sounds: Normal breath sounds.  Abdominal:     Abdomen is soft. There is no abdominal tenderness. No rebound or guarding. No distention. No vertebral tenderness, no ecchymosis Musculoskeletal:        General: No swelling. Normal range of motion.  Moving all extremities normally     Cervical back: Normal range of motion and neck supple.  Skin:    General: Skin is warm and dry.     Capillary Refill: Capillary refill takes less than 2 seconds.     Findings: No rash.  No wounds or ecchymosis noted Neurological:     Mental Status: The patient is awake and alert.  She is agitated, and will not answer my questions, reporting that she just needs to get off of the bed.  She is aware of where she is     ED Results / Procedures / Treatments   Labs (all labs ordered are listed, but only abnormal results are displayed) Labs Reviewed  COMPREHENSIVE METABOLIC PANEL - Abnormal; Notable for the following components:      Result Value   Glucose, Bld 139 (*)    GFR, Estimated 52 (*)    All other components within normal limits  CBC WITH DIFFERENTIAL/PLATELET - Abnormal; Notable for the following components:   Abs Immature Granulocytes 0.14 (*)    All other components within normal limits  URINALYSIS, ROUTINE W REFLEX MICROSCOPIC - Abnormal; Notable for the following components:   Color, Urine STRAW (*)    APPearance CLEAR (*)    Hgb urine dipstick MODERATE (*)    Crystals PRESENT (*)    All other components within normal limits  PROTIME-INR  APTT  TROPONIN I (HIGH SENSITIVITY)  TROPONIN I (HIGH SENSITIVITY)     EKG     RADIOLOGY I independently reviewed and interpreted imaging and agree with radiologists findings.     PROCEDURES:  Critical Care performed:   Procedures   MEDICATIONS ORDERED IN ED: Medications  lidocaine (LIDODERM) 5 % 1 patch (1 patch Transdermal Patch Applied 07/26/22 1611)  acetaminophen (TYLENOL) tablet 650 mg (650 mg Oral Given 07/26/22 1612)  LORazepam (ATIVAN) injection 0.5 mg (0.5 mg Intravenous Given 07/26/22 1612)  iohexol (OMNIPAQUE) 350 MG/ML injection 75 mL (75 mLs Intravenous Contrast Given 07/26/22 1636)  LORazepam (ATIVAN) injection 1 mg (1 mg Intravenous Given 07/26/22 1645)  droperidol (INAPSINE) 2.5 MG/ML injection 2.5 mg  (2.5 mg Intravenous Given 07/26/22 1749)  droperidol (INAPSINE) 2.5 MG/ML injection 2.5 mg (2.5 mg Intravenous Given 07/26/22 1750)     IMPRESSION / MDM / ASSESSMENT AND PLAN / ED COURSE  I reviewed the triage vital signs and the nursing notes.   Differential diagnosis includes, but is not limited to, compression fracture, hip fracture, contusion, intracranial hemorrhage, cervical spine injury, vertebral injury.  Patient  is awake and alert, hemodynamically stable and neural logically and neurovascularly intact.  She is agitated and having difficulty responding to redirection.  She reports that she needs to get off of the bed because she needs to urinate, despite the PureWick that the nurse has applied.  She was offered a bedpan but did not want to sit on it.  She keeps trying to climb off the bed.  She required sedation for CT imaging.  I was called multiple times by the CT technician given that the patient would not sit still for CT imaging.  She calm down after the droperidol was given.  Labs are reassuring.  Urinalysis is without evidence of infection.  CT scans demonstrate no acute injury.  Patient was reassessed multiple times throughout her emergency department stay.  She is sleeping after the heavy dose of droperidol but was required.  I asked Dr. Charna Archer to evaluate the patient as well, and he feels that given her normal imaging and labs, no indication for admission at this time.  He feels that her sleepiness is secondary to the medications that were given in order to undergo CT imaging.  Plan for reassessment after the medications has worn off.  Once that she is awake and alert, will attempt road test with walker.  If she is unable to ambulate with a walker, then consult social work and PT.  Patient passed off to C. Arvella Nigh prior to reassessment and final disposition.  Patient was discussed with and also seen by Dr. Charna Archer who agrees with assessment and plan.   Patient's presentation is most  consistent with acute presentation with potential threat to life or bodily function.   Clinical Course as of 07/26/22 2206  Thu Jul 26, 2022  1657 Patient continues to be very agitated.  Discussed with Dr. Charna Archer, will give droperidol [JP]  1936 Patient still sleeping [JP]  2049 Patient evaluated by Dr. Charna Archer.  Plan to still await metabolization of meds, and if she is able to ambulate with a walker then she may be discharged.  If she is unable to ambulate, then she will be observed in the emergency department for social work and physical therapy consult in the morning [JP]    Clinical Course User Index [JP] Dandra Velardi, Clarnce Flock, PA-C     FINAL CLINICAL IMPRESSION(S) / ED DIAGNOSES   Final diagnoses:  Fall, initial encounter  Fall in home, initial encounter     Rx / DC Orders   ED Discharge Orders     None        Note:  This document was prepared using Dragon voice recognition software and may include unintentional dictation errors.   Emeline Gins 07/26/22 2206    Blake Divine, MD 07/28/22 [phone]

## 2022-07-26 NOTE — ED Triage Notes (Signed)
First Nurse: Pt here via [place] with a witnessed mechanical fall c/o lower right back pain. Pt denies dizziness or LOC.   146/86 104 95% RA 151-cbg

## 2022-07-26 NOTE — ED Notes (Signed)
This writer bladder scanned pt due to pt daughter stating that she needed to pee, bladder scanner read [place] made aware.

## 2022-07-26 NOTE — ED Provider Notes (Signed)
Patient reassessed.  Still quite drowsy.  Given her age risk factors and unambulate have consulted hospitalist for admission.   Merlyn Lot, MD 07/26/22 [phone]

## 2022-07-26 NOTE — ED Notes (Signed)
Pt at CT, not able to update VS at this time

## 2022-07-26 NOTE — H&P (Signed)
History and Physical   [facility] [place] [facility] Admission History and Physical [employer], D.O.    Patient Name: Ashlee Reyes MR#: [id number] Date of Birth: Jun 05, 1921 Date of Admission: 07/26/2022  Referring MD/NP/PA: Dr. Quentin Cornwall Primary Care Physician: Tower, Wynelle Fanny, MD  Chief Complaint:  Chief Complaint  Patient presents with   Fall  Please note the entire history is obtained from the patient's emergency department chart, emergency department provider and the patient's family who is at the bedside. Patient's personal history is limited by dementia and sedation   HPI: Ashlee Reyes is a 88 y.o. female with a known history of dementia, asthma, atrial fibrillation, coronary artery disease, GERD, hyperlipidemia, osteopenia presents to the emergency department for evaluation of fall fall.  Patient was in a usual state of health until today she had an unwitnessed fall at home.  Family believes that patient slipped in the kitchen.  Patient complains of left-sided low back pain.   Patient lives at home with daughter and usually walks around the home independently.    Otherwise there has been no change in status. Patient has been taking medication as prescribed and there has been no recent change in medication or diet.  No recent antibiotics.  There has been no recent illness, hospitalizations, travel or sick contacts.    EMS/ED Course: Patient received droperidol, Tylenol, Ativan, Lidoderm. Medical admission has been requested for further management of inability to ambulate status post fall.  Review of Systems:  Unable to obtain 2/2 sedation   Past Medical History:  Diagnosis Date   AAA (abdominal aortic aneurysm) ([place])    followed by Dr Oneida Alar. Last u/s07/10 stable AAA with largest measurement 3.97 x 4.02cm.   Allergic rhinitis, seasonal    Asthma    Atrial fibrillation (HCC)    CAD (coronary artery disease) 07/2009   mod by cath 71/69    Complication of anesthesia    Depression    Disc disease, degenerative, lumbar or lumbosacral    SPINAL STENOSIS   Diverticulosis    GERD (gastroesophageal reflux disease)    Hemorrhoids    History of recurrent UTIs    Hyperlipidemia    IBS (irritable bowel syndrome)    IC (irritable colon)    DMSO in past   Interstitial cystitis    Osteoarthritis    spine/spinal stenosis   Osteopenia 2011   PONV (postoperative nausea and vomiting)    Skin cancer    basal cell cancer - face.    Past Surgical History:  Procedure Laterality Date   ABDOMINAL AORTIC ENDOVASCULAR STENT GRAFT N/A 07/01/2013   Procedure: ABDOMINAL AORTIC ENDOVASCULAR STENT GRAFT- GORE;  Surgeon: Elam Dutch, MD;  Location: [place];  Service: Vascular;  Laterality: N/A;  Ultrasound guided   ABDOMINAL HYSTERECTOMY     partial not cancer   BREAST SURGERY     lumpectomy   CYSTOCELE REPAIR     ESOPHAGOGASTRODUODENOSCOPY  09/1998   erosive esphagitis ? stricture treated 12/2002   HERNIA REPAIR  [id number]/8938   umbilical hernia   [place]     for film after cataract surgery.   RIGHT COLECTOMY  1997   benign   SKIN LESION EXCISION  05/2011   basal cell skin lesion removed.     reports that she has never smoked. She has never used smokeless tobacco. She reports that she does not drink alcohol and does not use drugs.  Allergies  Allergen Reactions   Amoxicillin-Pot Clavulanate Nausea And Vomiting    Has patient had a PCN reaction causing immediate rash, facial/tongue/throat swelling, SOB or lightheadedness with hypotension: No Has patient had a PCN reaction causing severe rash involving mucus membranes or skin necrosis: No Has patient had a PCN reaction that required hospitalization: No Has patient had a PCN reaction occurring within the last 10 years: No If all of the above answers are "NO", then may proceed with Cephalosporin use.    Avelox [Moxifloxacin] Nausea And Vomiting   Flovent  Hfa [Fluticasone] Other (See Comments)    "makes her feel funny" per family member   Ibuprofen Other (See Comments)    GI upset   Keflex [Cephalexin] Other (See Comments)    abd pain / GI upset   Nitrofurantoin Other (See Comments)    Chest pain or indigestion with nausea   Omeprazole Nausea Only   Prevnar [Pneumococcal 13-Val Conj Vacc] Itching    Severe local reaction with redness and itching    Rosuvastatin Other (See Comments)    Foot pain   Sertraline Hcl Other (See Comments)    Made her more depressed   Tetanus Toxoids     Local redness and swelling     Family History  Problem Relation Age of Onset   Heart disease Mother        deceased age 11 heart attack.   Heart attack Mother    Other Mother        varicose veins   Varicose Veins Mother    Heart disease Father        age 41 heart attack   Cancer Father    Stroke Brother    Cancer Brother    Heart disease Brother    Heart attack Brother    Cancer Daughter    Diabetes Son     Prior to Admission medications   Medication Sig Start Date End Date Taking? Authorizing Provider  Cholecalciferol (VITAMIN D3) 25 MCG (1000 UT) CAPS Take 1 capsule by mouth daily.   Yes [provider]  clopidogrel (PLAVIX) 75 MG tablet TAKE 1 TABLET(75 MG) BY MOUTH DAILY 08/10/21  Yes Tower, Marne A, MD  esomeprazole (NEXIUM) 40 MG capsule TAKE 1 CAPSULE(40 MG) BY MOUTH ONCE DAILY BEFORE A MEAL 05/03/22  Yes Tower, Marne A, MD  famotidine (PEPCID) 40 MG tablet TAKE 1 TABLET BY MOUTH EVERY DAY 02/27/22  Yes Kozlow, Donnamarie Poag, MD  fluticasone (FLONASE) 50 MCG/ACT nasal spray Place 2 sprays into both nostrils daily. 12/26/18  Yes Oretha Milch D, MD  LUMIGAN 0.01 % SOLN Place 1 drop into both eyes at bedtime. 12/18/17  Yes [provider]  Probiotic Product (ALIGN) 4 MG CAPS Take 1 capsule (4 mg total) by mouth daily. 12/26/18  Yes Oretha Milch D, MD  traMADol (ULTRAM) 50 MG tablet TAKE 1/2 TABLET BY MOUTH EVERY MORNING THEN TAKE 1  TABLET BY MOUTH AT BEDTIME 06/29/22  Yes Tower, Wynelle Fanny, MD  trimethoprim (TRIMPEX) 100 MG tablet Take 1 tablet (100 mg total) by mouth daily with lunch. 12/26/18  Yes Desiree Hane, MD  vitamin B-12 (CYANOCOBALAMIN) 1000 MCG tablet Take 1,000 mcg by mouth daily.   Yes [provider]  acetaminophen (TYLENOL) 500 MG tablet Take 1 tablet (500 mg total) by mouth every 4 (four) hours as needed for mild pain. 12/26/18   Desiree Hane, MD  albuterol (VENTOLIN HFA) 108 (90 Base) MCG/ACT inhaler INHALE 2 PUFFS BY MOUTH  EVERY 4 HOURS AS NEEDED FOR WHEEZING 05/30/21   Kozlow, Donnamarie Poag, MD  amLODipine (NORVASC) 2.5 MG tablet Take one pill by mouth daily as needed for elevated blood pressure (over 145/90) 01/12/19   Tower, Wynelle Fanny, MD  cetirizine (ZYRTEC) 10 MG tablet TAKE 1 TABLET(10 MG) BY MOUTH DAILY AS NEEDED 05/30/21   Kozlow, Donnamarie Poag, MD  docusate sodium (COLACE) 100 MG capsule Take 1 capsule (100 mg total) by mouth every evening. 12/26/18   Oretha Milch D, MD  lactase (LACTAID) 3000 units tablet Take 3,000 Units by mouth 3 (three) times daily as needed.    [provider]  loratadine (CLARITIN) 10 MG tablet Take 10 mg by mouth daily as needed. 07/09/22   [provider]  ondansetron (ZOFRAN-ODT) 4 MG disintegrating tablet DISSOLVE 1 TABLET(4 MG) ON THE TONGUE EVERY 8 HOURS AS NEEDED FOR NAUSEA OR VOMITING 03/09/22   Tower, Wynelle Fanny, MD  Polyethyl Glycol-Propyl Glycol (SYSTANE OP) Place 1 drop into both eyes every 8 (eight) hours as needed (dry eyes).     [provider]  polyethylene glycol (MIRALAX / GLYCOLAX) packet Take 17 g by mouth daily as needed for mild constipation. 12/26/18   Desiree Hane, MD    Physical Exam: Vitals:   07/26/22 1830 07/26/22 1838 07/26/22 1842 07/26/22 2110  BP: (!) 109/49 (!) 109/49  (!) 143/81  Pulse: (!) 107 (!) 102  (!) 106  Resp:  16  18  Temp:   98.5 F (36.9 C) 98.9 F (37.2 C)  TempSrc:   Oral Axillary  SpO2: 100% 99%  98%   Weight:      Height:        GENERAL: 87 y.o.-year-old female patient, well-developed, well-nourished lying in the bed in no acute distress.  Sedated, unable to comply with exam.   HEENT: Head atraumatic, normocephalic. Mucus membranes moist. NECK: Supple. No JVD. CHEST: Normal breath sounds bilaterally. No wheezing, rales, rhonchi or crackles. No use of accessory muscles of respiration.  No reproducible chest wall tenderness.  CARDIOVASCULAR: S1, S2 normal. No murmurs, rubs, or gallops. Cap refill <2 seconds. Pulses intact distally.  ABDOMEN: Soft, nondistended, nontender. No rebound, guarding, rigidity. Normoactive bowel sounds present in all four quadrants.  EXTREMITIES: No pedal edema, cyanosis, or clubbing. No calf tenderness or Homan's sign.  NEUROLOGIC: Unable to comply with exam due to sedation SKIN: Warm, dry, and intact without obvious rash, lesion, or ulcer.    Labs on Admission:  CBC: Recent Labs  Lab 07/26/22 1417  WBC 10.0  NEUTROABS 7.3  HGB 12.5  HCT 39.5  MCV 87.8  PLT 841   Basic Metabolic Panel: Recent Labs  Lab 07/26/22 1417  NA 136  K 4.0  CL 103  CO2 25  GLUCOSE 139*  BUN 15  CREATININE 0.99  CALCIUM 9.0   GFR: Estimated Creatinine Clearance: 34.4 mL/min (by C-G formula based on SCr of 0.99 mg/dL). Liver Function Tests: Recent Labs  Lab 07/26/22 1417  AST 20  ALT 12  ALKPHOS 57  BILITOT 0.7  PROT 7.0  ALBUMIN 3.7   No results for input(s): "LIPASE", "AMYLASE" in the last 168 hours. No results for input(s): "AMMONIA" in the last 168 hours. Coagulation Profile: Recent Labs  Lab 07/26/22 1417  INR 1.0   Cardiac Enzymes: No results for input(s): "CKTOTAL", "CKMB", "CKMBINDEX", "TROPONINI" in the last 168 hours. BNP (last 3 results) No results for input(s): "PROBNP" in the last 8760 hours. HbA1C: No results for  input(s): "HGBA1C" in the last 72 hours. CBG: No results for input(s): "GLUCAP" in the last 168 hours. Lipid  Profile: No results for input(s): "CHOL", "HDL", "LDLCALC", "TRIG", "CHOLHDL", "LDLDIRECT" in the last 72 hours. Thyroid Function Tests: No results for input(s): "TSH", "T4TOTAL", "FREET4", "T3FREE", "THYROIDAB" in the last 72 hours. Anemia Panel: No results for input(s): "VITAMINB12", "FOLATE", "FERRITIN", "TIBC", "IRON", "RETICCTPCT" in the last 72 hours. Urine analysis:    Component Value Date/Time   COLORURINE STRAW (A) 07/26/2022 1812   APPEARANCEUR CLEAR (A) 07/26/2022 1812   LABSPEC 1.018 07/26/2022 1812   PHURINE 7.0 07/26/2022 1812   GLUCOSEU NEGATIVE 07/26/2022 1812   HGBUR MODERATE (A) 07/26/2022 1812   HGBUR small 07/28/2009 1142   BILIRUBINUR NEGATIVE 07/26/2022 1812   BILIRUBINUR negative 09/08/2020 1230   KETONESUR NEGATIVE 07/26/2022 1812   PROTEINUR NEGATIVE 07/26/2022 1812   UROBILINOGEN 0.2 09/08/2020 1230   UROBILINOGEN 0.2 01/11/2015 1128   NITRITE NEGATIVE 07/26/2022 1812   LEUKOCYTESUR NEGATIVE 07/26/2022 1812   Sepsis Labs: '@LABRCNTIP'$ (procalcitonin:4,lacticidven:4) )No results found for this or any previous visit (from the past 240 hour(s)).   Radiological Exams on Admission: CT Head Wo Contrast  Result Date: 07/26/2022 CLINICAL DATA:  Fall, head trauma EXAM: CT HEAD WITHOUT CONTRAST CT CERVICAL SPINE WITHOUT CONTRAST TECHNIQUE: Multidetector CT imaging of the head and cervical spine was performed following the standard protocol without intravenous contrast. Multiplanar CT image reconstructions of the cervical spine were also generated. RADIATION DOSE REDUCTION: This exam was performed according to the departmental dose-optimization program which includes automated exposure control, adjustment of the mA and/or kV according to patient size and/or use of iterative reconstruction technique. COMPARISON:  CT head 12/24/2018; no prior dedicated CT of the cervical spine correlation is made with 12/23/2018 CTA neck FINDINGS: Evaluation is somewhat limited by motion  artifact. CT HEAD FINDINGS Brain: No evidence of acute infarct, hemorrhage, mass, mass effect, or midline shift. No hydrocephalus or new extra-axial collection. Vascular: No hyperdense vessel. Atherosclerotic calcifications in the intracranial carotid and vertebral arteries. Skull: Normal. Negative for fracture or focal lesion. Sinuses/Orbits: Mucous retention cysts in the maxillary sinuses. Status post bilateral lens replacements. Other: The mastoid air cells are well aerated. CT CERVICAL SPINE FINDINGS Alignment: Levocurvature of the cervical spine, which may be positional. No listhesis. Skull base and vertebrae: No acute fracture. No primary bone lesion or focal pathologic process. Soft tissues and spinal canal: No prevertebral fluid or swelling. No visible canal hematoma. Disc levels: Multilevel degenerative changes without high-grade spinal canal stenosis. Multilevel uncovertebral and facet arthropathy, which causes severe neural foraminal narrowing on the right at C3-C4 and C4-C5. Upper chest: Please see same-day CT chest. Other: None. IMPRESSION: 1. No acute intracranial process. 2. No acute fracture or traumatic listhesis in the cervical spine. Electronically Signed   By: Merilyn Baba M.D.   On: 07/26/2022 18:38   CT Cervical Spine Wo Contrast  Result Date: 07/26/2022 CLINICAL DATA:  Fall, head trauma EXAM: CT HEAD WITHOUT CONTRAST CT CERVICAL SPINE WITHOUT CONTRAST TECHNIQUE: Multidetector CT imaging of the head and cervical spine was performed following the standard protocol without intravenous contrast. Multiplanar CT image reconstructions of the cervical spine were also generated. RADIATION DOSE REDUCTION: This exam was performed according to the departmental dose-optimization program which includes automated exposure control, adjustment of the mA and/or kV according to patient size and/or use of iterative reconstruction technique. COMPARISON:  CT head 12/24/2018; no prior dedicated CT of the  cervical spine correlation is made with 12/23/2018  CTA neck FINDINGS: Evaluation is somewhat limited by motion artifact. CT HEAD FINDINGS Brain: No evidence of acute infarct, hemorrhage, mass, mass effect, or midline shift. No hydrocephalus or new extra-axial collection. Vascular: No hyperdense vessel. Atherosclerotic calcifications in the intracranial carotid and vertebral arteries. Skull: Normal. Negative for fracture or focal lesion. Sinuses/Orbits: Mucous retention cysts in the maxillary sinuses. Status post bilateral lens replacements. Other: The mastoid air cells are well aerated. CT CERVICAL SPINE FINDINGS Alignment: Levocurvature of the cervical spine, which may be positional. No listhesis. Skull base and vertebrae: No acute fracture. No primary bone lesion or focal pathologic process. Soft tissues and spinal canal: No prevertebral fluid or swelling. No visible canal hematoma. Disc levels: Multilevel degenerative changes without high-grade spinal canal stenosis. Multilevel uncovertebral and facet arthropathy, which causes severe neural foraminal narrowing on the right at C3-C4 and C4-C5. Upper chest: Please see same-day CT chest. Other: None. IMPRESSION: 1. No acute intracranial process. 2. No acute fracture or traumatic listhesis in the cervical spine. Electronically Signed   By: Merilyn Baba M.D.   On: 07/26/2022 18:38   CT T-SPINE NO CHARGE  Result Date: 07/26/2022 CLINICAL DATA:  Mechanical fall.  Back pain. EXAM: CT THORACIC SPINE WITHOUT CONTRAST TECHNIQUE: Multidetector CT images of the thoracic were obtained using the standard protocol without intravenous contrast. RADIATION DOSE REDUCTION: This exam was performed according to the departmental dose-optimization program which includes automated exposure control, adjustment of the mA and/or kV according to patient size and/or use of iterative reconstruction technique. COMPARISON:  Two-view chest x-ray 12/23/2018.  CTA chest 10/17/2013 FINDINGS:  Alignment: No significant listhesis is present. Focal kyphosis noted at T12-L1. Vertebrae: Vertebral body heights are maintained. No acute fractures are present. Fused anterior osteophytes are present T2 through T12. Paraspinal and other soft tissues: See dedicated report for CT of the chest and abdomen. Disc levels: No significant central canal stenosis is present. Right foraminal stenosis is present at T9-10 secondary to asymmetric right-sided facet spurring the foramina are otherwise patent. IMPRESSION: 1. No acute fracture or traumatic subluxation. 2. Fused anterior osteophytes T2 through T12 consistent with DISH. 3. Right foraminal stenosis at T9-10 secondary to asymmetric right-sided facet spurring. Electronically Signed   By: San Morelle M.D.   On: 07/26/2022 18:27   CT L-SPINE NO CHARGE  Result Date: 07/26/2022 CLINICAL DATA:  Mechanical fall.  Low back pain. EXAM: CT LUMBAR SPINE WITHOUT CONTRAST TECHNIQUE: Multidetector CT imaging of the lumbar spine was performed without intravenous contrast administration. Multiplanar CT image reconstructions were also generated. RADIATION DOSE REDUCTION: This exam was performed according to the departmental dose-optimization program which includes automated exposure control, adjustment of the mA and/or kV according to patient size and/or use of iterative reconstruction technique. COMPARISON:  CT of the abdomen and pelvis 09/18/2018 FINDINGS: Segmentation: 5 non rib-bearing lumbar type vertebral bodies are present. The lowest fully formed vertebral body is L5. Alignment: Grade 1 anterolisthesis L3-4 and L4-5 is stable. Leftward curvature is centered at L4, also stable. Vertebrae: The remote superior endplate fracture of L1 is stable. Asymmetric sclerotic changes are again noted on the right at L4-5. Paraspinal and other soft tissues: See full report of CT abdomen and pelvis. Disc levels: T12-L1: No significant stenosis. L1-2: Moderate facet hypertrophy  present. Mild to moderate foraminal stenosis is noted bilaterally. L2-3: A broad-based disc protrusion endplate spurring and facet hypertrophy results in mild central and moderate foraminal stenosis bilaterally. L3-4: A broad-based disc protrusion is present. Advanced facet hypertrophy is noted. Moderate  central and bilateral foraminal narrowing is present, left greater than right. L4-5: Vacuum disc is present. Broad-based disc protrusion and advanced facet hypertrophy results in moderate central canal stenosis. Moderate foraminal narrowing is worse on the right. L5-S1: Ankylosis noted.  No significant stenosis is present. IMPRESSION: 1. No acute fracture. 2. Remote superior endplate fracture of L1 is stable. 3. Stable grade 1 anterolisthesis L3-4 and L4-5 secondary to advanced facet hypertrophy and a broad-based disc protrusion with moderate central and bilateral foraminal stenosis at L4-5. 4. Mild central and moderate foraminal stenosis bilaterally at L2-3. 5. Moderate central and bilateral foraminal stenosis at L3-4. 6. Moderate foraminal narrowing bilaterally at L4-5 is worse on the right. 7. Ankylosis at L5-S1. Electronically Signed   By: San Morelle M.D.   On: 07/26/2022 18:23   CT CHEST ABDOMEN PELVIS W CONTRAST  Result Date: 07/26/2022 CLINICAL DATA:  [place], lower back pain, dementia EXAM: CT CHEST, ABDOMEN, AND PELVIS WITH CONTRAST TECHNIQUE: Multidetector CT imaging of the chest, abdomen and pelvis was performed following the standard protocol during bolus administration of intravenous contrast. RADIATION DOSE REDUCTION: This exam was performed according to the departmental dose-optimization program which includes automated exposure control, adjustment of the mA and/or kV according to patient size and/or use of iterative reconstruction technique. CONTRAST:  45m OMNIPAQUE IOHEXOL 350 MG/ML SOLN COMPARISON:  09/18/2018 FINDINGS: CT CHEST FINDINGS Cardiovascular: The heart is unremarkable without  pericardial effusion. No evidence of thoracic aortic aneurysm or dissection. Atherosclerosis of the aorta and coronary vasculature. Calcifications of the mitral and aortic valves. Mediastinum/Nodes: No enlarged mediastinal, hilar, or axillary lymph nodes. Thyroid gland, trachea, and esophagus demonstrate no significant findings. Lungs/Pleura: 11 mm ground-glass nodule within the right upper lobe image 66/2 has been present since 2014, without significant interval change. No acute airspace disease, effusion, or pneumothorax. Central airways are patent. Musculoskeletal: No acute or destructive bony lesions. Reconstructed images demonstrate no additional findings. CT ABDOMEN PELVIS FINDINGS Hepatobiliary: No hepatic injury or perihepatic hematoma. Gallbladder is unremarkable. Pancreas: Unremarkable. No pancreatic ductal dilatation or surrounding inflammatory changes. Spleen: No splenic injury or perisplenic hematoma. Adrenals/Urinary Tract: Bilateral simple appearing cortical and peripelvic renal cysts are noted. No specific imaging follow-up is required. Bilateral renal cortical thinning. No focal solid lesion. The adrenals and bladder are grossly unremarkable. Stomach/Bowel: No bowel obstruction or ileus. Postsurgical changes from partial right hemicolectomy. There is marked diverticulosis of the distal rectosigmoid colon, with no evidence of acute diverticulitis. Vascular/Lymphatic: Postsurgical changes from prior endoluminal stent graft repair of an abdominal aortic aneurysm. No evidence of aneurysm on this exam. Diffuse atherosclerosis of the aorta and its distal branches. No pathologic adenopathy. Reproductive: Status post hysterectomy. No adnexal masses. Other: No free fluid or free intraperitoneal gas. There is a fat containing supraumbilical midline ventral hernia. Just inferior to this, there is a small ventral hernia containing a portion of the anti mesenteric wall of transverse colon, consistent with a  Richter type hernia. No incarceration or obstruction. Musculoskeletal: No acute or destructive bony lesions. Chronic L1 compression deformity unchanged. Reconstructed images demonstrate no additional findings. IMPRESSION: 1. No acute intrathoracic, intra-abdominal, or intrapelvic trauma. 2. Grossly stable 11 mm right upper lobe ground-glass nodule, most consistent with benign etiology given long-term stability. 3. Upper abdominal midline ventral hernias as above. Small fat containing hernia, with an adjacent focal Richter type hernia containing a portion of the transverse colon. No obstruction or incarceration. 4. Distal colonic diverticulosis without diverticulitis. 5. Aortic Atherosclerosis (ICD10-I70.0). Coronary artery atherosclerosis. Electronically Signed  By: Randa Ngo M.D.   On: 07/26/2022 18:17     Assessment/Plan  This is a 88 y.o. female with a history of dementia, asthma, atrial fibrillation, coronary artery disease, GERD, hyperlipidemia, osteopenia now being admitted with:  #.  Low back pain status post mechanical fall - Admit observation -pain control -physical therapy consult for a.m. for safe discharge planning, considering SNF vs. Home health  #.  History of hypertension - Continue Norvasc  #.  History of GERD - Continue Pepcid  #. History of CAD - Continue Plavix  #. History of asthma, allergies - Continue albuterol, Zyrtec, Flonase, Claritin  Admission status: Observation IV Fluids: Hep-Lock Diet/Nutrition: Heart healthy Consults called: PT DVT Px: Lovenox, SCDs and early ambulation. Code Status: Full Code  Disposition Plan: To home in less than 24 hours  All the records are reviewed and case discussed with ED provider. Management plans discussed with the patient and/or family who express understanding and agree with plan of care.  Jamela Cumbo D.O. on 07/26/2022 at 10:58 PM CC: Primary care physician; Tower, Wynelle Fanny, MD   07/26/2022, 10:58 PM

## 2022-07-26 NOTE — ED Notes (Signed)
Patient transported to CT 

## 2022-07-26 NOTE — ED Triage Notes (Signed)
PT takes plavix

## 2022-07-26 NOTE — ED Triage Notes (Signed)
PT coming from home with GLF hitting back. Pt has PMH dementia. And is not able to recall much.

## 2022-07-26 NOTE — ED Notes (Signed)
The pt while in CT continued to be agitated and was placed on 2 liters of O2 due to med administration and agitation.

## 2022-07-26 NOTE — ED Notes (Signed)
52 yof with a c/c of accidental fall today. The pt's daughter at the bedside advised the pt had a fall at home earlier where she fell backwards on the floor landing on a cat bowl. The pt was able to stand and pivot with assistance to the bed. The pt was placed on the bed without incident. A purwick was placed on the pt. Call bell placed at the bedside. The pt was c/o of back pain and was very demented. The pt's daughter expressed concerns about pain management, she was advised the provider would be in shortly to assess her mother.

## 2022-07-27 DIAGNOSIS — I714 Abdominal aortic aneurysm, without rupture, unspecified: Secondary | ICD-10-CM

## 2022-07-27 DIAGNOSIS — S3992XA Unspecified injury of lower back, initial encounter: Secondary | ICD-10-CM | POA: Diagnosis not present

## 2022-07-27 DIAGNOSIS — I679 Cerebrovascular disease, unspecified: Secondary | ICD-10-CM | POA: Diagnosis not present

## 2022-07-27 DIAGNOSIS — R338 Other retention of urine: Secondary | ICD-10-CM

## 2022-07-27 DIAGNOSIS — N3010 Interstitial cystitis (chronic) without hematuria: Secondary | ICD-10-CM

## 2022-07-27 DIAGNOSIS — M545 Low back pain, unspecified: Secondary | ICD-10-CM | POA: Diagnosis not present

## 2022-07-27 DIAGNOSIS — I10 Essential (primary) hypertension: Secondary | ICD-10-CM

## 2022-07-27 LAB — URINALYSIS, COMPLETE (UACMP) WITH MICROSCOPIC
Bilirubin Urine: NEGATIVE
Glucose, UA: NEGATIVE mg/dL
Ketones, ur: NEGATIVE mg/dL
Leukocytes,Ua: NEGATIVE
Nitrite: NEGATIVE
Protein, ur: NEGATIVE mg/dL
RBC / HPF: >50 RBC/hpf — ABNORMAL HIGH (ref 0–5)
Specific Gravity, Urine: 1.017 (ref 1.005–1.030)
pH: 7 (ref 5.0–8.0)

## 2022-07-27 LAB — COMPREHENSIVE METABOLIC PANEL
ALT: 11 U/L (ref 0–44)
AST: 18 U/L (ref 15–41)
Albumin: 3.5 g/dL (ref 3.5–5.0)
Alkaline Phosphatase: 51 U/L (ref 38–126)
Anion gap: 4 — ABNORMAL LOW (ref 5–15)
BUN: 11 mg/dL (ref 8–23)
CO2: 27 mmol/L (ref 22–32)
Calcium: 8.4 mg/dL — ABNORMAL LOW (ref 8.9–10.3)
Chloride: 108 mmol/L (ref 98–111)
Creatinine, Ser: 0.82 mg/dL (ref 0.44–1.00)
GFR, Estimated: >60 mL/min (ref >60)
Glucose, Bld: 107 mg/dL — ABNORMAL HIGH (ref 70–99)
Potassium: 3.5 mmol/L (ref 3.5–5.1)
Sodium: 139 mmol/L (ref 135–145)
Total Bilirubin: 1.2 mg/dL (ref 0.3–1.2)
Total Protein: 6.4 g/dL — ABNORMAL LOW (ref 6.5–8.1)

## 2022-07-27 LAB — CBC
HCT: 39.3 % (ref 36.0–46.0)
Hemoglobin: 12.6 g/dL (ref 12.0–15.0)
MCH: 27.9 pg (ref 26.0–34.0)
MCHC: 32.1 g/dL (ref 30.0–36.0)
MCV: 86.9 fL (ref 80.0–100.0)
Platelets: 213 10*3/uL (ref 150–400)
RBC: 4.52 MIL/uL (ref 3.87–5.11)
RDW: 14.4 % (ref 11.5–15.5)
WBC: 11.4 10*3/uL — ABNORMAL HIGH (ref 4.0–10.5)
nRBC: 0 % (ref 0.0–0.2)

## 2022-07-27 MED ORDER — FAMOTIDINE 20 MG PO TABS
40.0000 mg | ORAL_TABLET | Freq: Every day | ORAL | Status: DC
  Administered 2022-07-27: 40 mg via ORAL
  Filled 2022-07-27 (×2): qty 2

## 2022-07-27 MED ORDER — TRAMADOL HCL 50 MG PO TABS
50.0000 mg | ORAL_TABLET | Freq: Every day | ORAL | Status: DC
  Administered 2022-07-27: 50 mg via ORAL
  Filled 2022-07-27: qty 1

## 2022-07-27 MED ORDER — CLOPIDOGREL BISULFATE 75 MG PO TABS
75.0000 mg | ORAL_TABLET | Freq: Every day | ORAL | Status: DC
  Administered 2022-07-28: 75 mg via ORAL
  Filled 2022-07-27: qty 1

## 2022-07-27 MED ORDER — DOCUSATE SODIUM 100 MG PO CAPS
100.0000 mg | ORAL_CAPSULE | Freq: Every evening | ORAL | Status: DC
  Administered 2022-07-27: 100 mg via ORAL
  Filled 2022-07-27: qty 1

## 2022-07-27 MED ORDER — PANTOPRAZOLE SODIUM 40 MG PO TBEC
40.0000 mg | DELAYED_RELEASE_TABLET | Freq: Every day | ORAL | Status: DC
  Administered 2022-07-27 – 2022-07-28 (×2): 40 mg via ORAL
  Filled 2022-07-27 (×2): qty 1

## 2022-07-27 MED ORDER — TRIMETHOPRIM 100 MG PO TABS
100.0000 mg | ORAL_TABLET | Freq: Every day | ORAL | Status: DC
  Administered 2022-07-28: 100 mg via ORAL
  Filled 2022-07-27: qty 1

## 2022-07-27 MED ORDER — AMLODIPINE BESYLATE 5 MG PO TABS
2.5000 mg | ORAL_TABLET | Freq: Every day | ORAL | Status: DC
  Filled 2022-07-27: qty 1

## 2022-07-27 NOTE — Assessment & Plan Note (Signed)
CT head, cervical, lumbar, and thoracic, chest abdomen and pelvis computed tomography without evidence of injury.  Worked with physical therapy who felt that the patient would benefit from SNF rehabilitation. - Consult TOC

## 2022-07-27 NOTE — Assessment & Plan Note (Signed)
Patient has interstitial cystitis.  Her dementia precludes adequate history taking.  Report of gross hematuria by nursing today, In-N-Out cath done and noted to be some scratches around the urethra, likely source of bleeding. - Follow-up urinalysis and urine culture - Hold antibiotics for now

## 2022-07-27 NOTE — TOC Initial Note (Addendum)
Transition of Care [facility]) - Initial/Assessment Note    Patient Details  Name: Ashlee Reyes MRN: [id number] Date of Birth: 02-22-1921  [facility]) CM/SW Contact:    Magnus Ivan, LCSW Phone Number: 07/27/2022, 2:52 PM  Clinical Narrative:                Patient oriented to self only. Spoke with daughter Ashlee Reyes via phone. Patient lives with Ashlee Reyes who drives her to appointments. PCP is Dr. Glori Bickers. Pharmacy is [facility]. Patient uses a RW (short distances) and transport wheelchair (in the community) at baseline. Patient has been to [facility] in the past. Ashlee Reyes is agreeable to SNF, she would like patient to go to [facility] and stated she has already spoken with Wilette at [facility] to confirm they can take patient. CSW called Wilette at [facility] who confirmed they can take tomorrow 8/12 patient under the [facility] Medicare Waiver. No bed today. CSW encrypted emailed [facility] to notify them of patient using waiver who responded and confirmed patient can go to [facility] under [place]. Explained to daughter Ashlee Reyes patient will DC using [facility] Waiver even though she is under Obs.  Plan for DC to [facility] on 07/28/22. TOC to contact April Irby at [facility] main # tomorrow.    Expected Discharge Plan: Skilled Nursing Facility Barriers to Discharge: Continued Medical Work up   Patient Goals and CMS Choice Patient states their goals for this hospitalization and ongoing recovery are:: SNF CMS Medicare.gov Compare Post Acute Care list provided to:: Patient Represenative (must comment) Choice offered to / list presented to : Adult Children  Expected Discharge Plan and Services Expected Discharge Plan: [place] arrangements for the past 2 months: Single Family Home                                      Prior Living Arrangements/Services Living arrangements for the past 2 months:  Single Family Home Lives with:: Adult Children Patient language and need for interpreter reviewed:: Yes Do you feel safe going back to the place where you live?: Yes      Need for Family Participation in Patient Care: Yes (Comment) Care giver support system in place?: Yes (comment) Current home services: DME Criminal Activity/Legal Involvement Pertinent to Current Situation/Hospitalization: No - Comment as needed  Activities of Daily Living      Permission Sought/Granted Permission sought to share information with : Facility Art therapist granted to share information with : Yes, Verbal Permission Granted (by daughter Ashlee Reyes)     Permission granted to share info w AGENCY: [facility] [place]        Emotional Assessment       Orientation: : Oriented to Self Alcohol / Substance Use: Not Applicable Psych Involvement: No (comment)  Admission diagnosis:  Low back pain [M54.50] Patient Active Problem List   Diagnosis Date Noted   Cerebrovascular disease 07/27/2022   Essential hypertension 07/27/2022   Low back pain and ambulatory dysfunction 07/26/2022   Dementia, old age ([place]) 08/10/2021   Thickened nails 08/10/2021   Dysphagia 03/21/2021   Vitamin B12 deficiency 10/12/2020   Nausea 09/08/2020   Fatigue 09/08/2020   Current use of proton pump inhibitor 09/08/2020   Labile blood pressure 07/31/2019   Allergic rhinitis 02/20/2019   Palliative care status  01/12/2019   Hypertensive urgency 12/24/2018   History of compression fracture of spine 09/26/2018   Mobility impaired 09/26/2018   History of endovascular stent graft for abdominal aortic aneurysm (AAA) 01/01/2018   Unilateral primary osteoarthritis, left knee 07/11/2017   Hyperlipidemia 03/12/2017   Frequent UTI/interstitial cystitis 02/06/2016   Hypotension 01/09/2016   Carotid stenosis 06/01/2015   Local reaction to immunization 02/21/2015   Encounter for Medicare annual wellness exam  02/15/2015   Estrogen deficiency 02/15/2015   Sinusitis, chronic 01/12/2015   IBS (irritable bowel syndrome) 01/12/2015   Orthostatic hypotension 01/11/2015   Palpitations 05/13/2014   Second degree heart block 10/18/2013   Aneurysm of abdominal vessel ([place]) 06/25/2013   Osteopenia 01/13/2013   CAD (coronary artery disease)    Chronic depression 06/03/2007   GERD 06/02/2007   Asthma    Chronic interstitial cystitis    Osteoarthritis    Spinal stenosis of lumbar region    History of urinary tract infection    PCP:  Abner Greenspan, MD Pharmacy:   [facility], [place], [place] [address], [place] [address] Phone: [phone] Fax: [phone]  [id number] - [place], [place] - [address] [place] [address] Phone: [phone] Fax: [phone]  [id number] [address]: [phone]: [phone]    Social Determinants of Health (SDOH) Interventions    Readmission Risk Interventions     No data to display

## 2022-07-27 NOTE — Evaluation (Signed)
Physical Therapy Evaluation Patient Details Name: Ashlee Reyes MRN: [id number] DOB: 08-Sep-1921 Today's Date: 07/27/2022  History of Present Illness  88 y.o. female with a known history of dementia, asthma, atrial fibrillation, coronary artery disease, GERD, hyperlipidemia, osteopenia presents to the emergency department for evaluation of fall fall.  Patient was in a usual state of health until today she had an unwitnessed fall at home.  Family believes that patient slipped in the kitchen.Test results show remote superior endplate fracture of L1 is stable.   Clinical Impression  Pt received supine in bed upon arrival to room and agreeable to therapy.  Pt's daughter in room at this time and able to provide history due to pt's cognitive decline.  Pt is HOH, however is able to respond to single commands frequently throughout treatment.  Pt able to perform transfers as noted below, and daughter stating that physically and mentally, she is only at about 10-20% of her baseline level.  Due to cognitive decline and generalized weakness with transfers from bed and seat, pt would benefit from stint at [facility] facility.  Daughter is agreeable to recommendation at this time.         Recommendations for follow up therapy are one component of a multi-disciplinary discharge planning process, led by the attending physician.  Recommendations may be updated based on patient status, additional functional criteria and insurance authorization.  Follow Up Recommendations Skilled nursing-short term rehab (<3 hours/day) Can patient physically be transported by private vehicle: No    Assistance Recommended at Discharge Frequent or constant Supervision/Assistance  Patient can return home with the following  A lot of help with walking and/or transfers;A lot of help with bathing/dressing/bathroom;Assistance with cooking/housework;Direct supervision/assist for medications management;Direct supervision/assist for financial  management;Assist for transportation;Help with stairs or ramp for entrance    Equipment Recommendations  (Defer to next venue of care)  Recommendations for Other Services       Functional Status Assessment Patient has had a recent decline in their functional status and demonstrates the ability to make significant improvements in function in a reasonable and predictable amount of time.     Precautions / Restrictions Precautions Precautions: Fall      Mobility  Bed Mobility Overal bed mobility: Needs Assistance Bed Mobility: Supine to Sit, Sit to Supine     Supine to sit: Min guard, HOB elevated Sit to supine: Min guard, HOB elevated   General bed mobility comments: increased time to complete task and cuing for technique and hand placement    Transfers Overall transfer level: Needs assistance Equipment used: Rolling walker (2 wheels) Transfers: Sit to/from Stand, Bed to chair/wheelchair/BSC Sit to Stand: Min guard   Step pivot transfers: Min guard            Ambulation/Gait               General Gait Details: deferred due to inability to stand up and safety reasons at this time.  Stairs            Wheelchair Mobility    Modified Rankin (Stroke Patients Only)       Balance Overall balance assessment: Needs assistance Sitting-balance support: Feet supported Sitting balance-Leahy Scale: Good     Standing balance support: Reliant on assistive device for balance, During functional activity, Bilateral upper extremity supported Standing balance-Leahy Scale: Fair  Pertinent Vitals/Pain Pain Assessment Pain Assessment: No/denies pain    Home Living Family/patient expects to be discharged to:: Private residence Living Arrangements: Children Available Help at Discharge: Family;Available 24 hours/day Type of Home: House Home Access: Ramped entrance       Home Layout: One level Home Equipment: Rolling  Walker (2 wheels);Grab bars - tub/shower;Shower seat;Other (comment) Additional Comments: transport chair    Prior Function Prior Level of Function : Needs assist             Mobility Comments: Pt's daughter reports pt ambulates with RW in home and in community. She has had several falls lately. ADLs Comments: Pt needs assistance for self care secondary to cognition     Hand Dominance   Dominant Hand: Right    Extremity/Trunk Assessment   Upper Extremity Assessment Upper Extremity Assessment: Generalized weakness    Lower Extremity Assessment Lower Extremity Assessment: Overall WFL for tasks assessed;Generalized weakness       Communication   Communication: HOH  Cognition Arousal/Alertness: Awake/alert Behavior During Therapy: Flat affect Overall Cognitive Status: History of cognitive impairments - at baseline                                 General Comments: Caregiver, Daughter, did report that congitive pt is far from baseline. Pt follows 1 step commands and is very pleasant        General Comments      Exercises     Assessment/Plan    PT Assessment Patient needs continued PT services  PT Problem List Decreased strength;Decreased activity tolerance;Decreased balance;Decreased mobility;Decreased cognition;Decreased knowledge of use of DME;Decreased safety awareness       PT Treatment Interventions DME instruction;Gait training;Functional mobility training;Therapeutic activities;Therapeutic exercise;Balance training;Neuromuscular re-education    PT Goals (Current goals can be found in the Care Plan section)  Acute Rehab PT Goals Patient Stated Goal: to get better. PT Goal Formulation: With patient Time For Goal Achievement: 08/10/22 Potential to Achieve Goals: Fair    Frequency Min 2X/week     Co-evaluation               AM-PAC PT "6 Clicks" Mobility  Outcome Measure Help needed turning from your back to your side while in a  flat bed without using bedrails?: A Little Help needed moving from lying on your back to sitting on the side of a flat bed without using bedrails?: A Little Help needed moving to and from a bed to a chair (including a wheelchair)?: A Little Help needed standing up from a chair using your arms (e.g., wheelchair or bedside chair)?: A Little Help needed to walk in hospital room?: A Lot Help needed climbing 3-5 steps with a railing? : Total 6 Click Score: 15    End of Session   Activity Tolerance: Patient tolerated treatment well Patient left: in bed;with call bell/phone within reach;with family/visitor present Nurse Communication: Mobility status;Other (comment) (Pt noting that her IV was hurting.) PT Visit Diagnosis: Unsteadiness on feet (R26.81);Other abnormalities of gait and mobility (R26.89);Repeated falls (R29.6);Muscle weakness (generalized) (M62.81);History of falling (Z91.81);Difficulty in walking, not elsewhere classified (R26.2)    Time: 0865-7846 PT Time Calculation (min) (ACUTE ONLY): 14 min   Charges:   PT Evaluation $PT Eval Low Complexity: 1 Low          Gwenlyn Saran, PT, DPT 07/27/22, 2:12 PM

## 2022-07-27 NOTE — Progress Notes (Addendum)
CSW acknowledges consult for HH/DME/SNF.   Asked MD for PT/OT consults.  TOC to follow.  Ashlee Reyes, [name]

## 2022-07-27 NOTE — Hospital Course (Addendum)
Ashlee Reyes is a 88 y.o. F with dementia, lives at home, HTN, orthostatic hypotension, hx CVA, interstitial cystitis, hx PVD s/p AAA endovascular repair 2014 who presented with fall.   8/10: Unable to ambulate due to pain, admitted for work up.  CT head, C/T/L-spine, chest/abdomen and pelvis without evidence of acute fracture, traumatic subluxation of the spine, or other acute finding

## 2022-07-27 NOTE — Care Management Obs Status (Signed)
[place] NOTIFICATION   Patient Details  Name: Ashlee Reyes MRN: [id number] Date of Birth: [birth year]   Medicare Observation Status Notification Given:  Yes    Trinda Harlacher E Ibrahima Holberg, LCSW 07/27/2022, 3:09 PM

## 2022-07-27 NOTE — Assessment & Plan Note (Signed)
-   Continue home Plavix

## 2022-07-27 NOTE — Progress Notes (Signed)
  Progress Note   Patient: Ashlee Reyes KWI:[id number] DOB: 1921-07-12 DOA: 07/26/2022     0 DOS: the patient was seen and examined on 07/27/2022 at 12:40 PM      Brief hospital course: Mrs. Rehberg is a 87 y.o. F with dementia, lives at home, HTN, orthostatic hypotension, hx CVA, interstitial cystitis, hx PVD s/p AAA endovascular repair 2014 who presented with fall.   8/10: Unable to ambulate due to pain, admitted for work up.  CT head, C/T/L-spine, chest/abdomen and pelvis without evidence of acute fracture, traumatic subluxation of the spine, or other acute finding     Assessment and Plan: * Low back pain and ambulatory dysfunction CT head, cervical, lumbar, and thoracic, chest abdomen and pelvis computed tomography without evidence of injury.  Worked with physical therapy who felt that the patient would benefit from SNF rehabilitation. - Consult TOC  Acute urinary retention Patient has interstitial cystitis.  Her dementia precludes adequate history taking.  Report of gross hematuria by nursing today, In-N-Out cath done and noted to be some scratches around the urethra, likely source of bleeding. - Follow-up urinalysis and urine culture - Hold antibiotics for now  Essential hypertension Blood pressure controlled - Continue amlodipine  Cerebrovascular disease - Continue home Plavix  Dementia, old age [facility]) - PT/OT  Hyperlipidemia Not on statin  Aneurysm of abdominal vessel ([place]) - Continue home Plavix  Chronic interstitial cystitis - Continue home trimethoprim - See above regarding urinary retention          Subjective: Patient is confused.  Nursing reported some pink urine, likely from peri urethral scratches.  No abdominal pain, no vomiting, no fever.     Physical Exam: Vitals:   07/27/22 0800 07/27/22 1100 07/27/22 1450 07/27/22 1626  BP: 102/70 110/75 108/80 (!) 143/73  Pulse: 95 90 92 (!) 101  Resp: '18 20 18 18  '$ Temp: 98.5 F (36.9 C)  98.6 F (37 C) 98.2 F (36.8 C) 99 F (37.2 C)  TempSrc: Oral Oral Oral Oral  SpO2: 92% 94% 92% 95%  Weight:      Height:       Elderly adult female, lying in bed, confused RRR, no murmurs, no peripheral edema Respiratory rate normal, lungs clear without rales or wheezes Abdomen soft without tenderness palpation or guarding, no ascites or distention Attention diminished, affect blunted, judgment and insight appear impaired by dementia as, moves upper extremities with 4/5 strength, symmetrically, face symmetric, speech fluent    Data Reviewed: CT imaging of the head, spine, chest abdomen and pelvis reviewed and unremarkable Complete metabolic panel normal CBC normal Urinalysis without white blood cells or bacteria  Family Communication: Daughter at the bedside    Disposition: Status is: Observation The patient was admitted after a fall.  She appears weak, she may have a mild UTI which could be treated with oral antibiotics, but given her history of interstitial cystitis, I suspect there are other reasons for her to have some pink urine and we will do further work-up before starting antibiotics  She has required no IV pain medicine, Ativan, or antiemetics during the course of the day today.  She appears to be weak and would benefit from physical therapy which is being arranged        Author: Edwin Dada, MD 07/27/2022 4:50 PM  For on call review [email or web address].

## 2022-07-27 NOTE — Progress Notes (Signed)
End of shift note:  Pt admitted to the floor. Schedule medications were given. Plan of care was reviewed with family. Pt was bladder scanned for 135 ml at 1845. PT/OT are on board for pt.

## 2022-07-27 NOTE — NC FL2 (Signed)
[place] LEVEL OF CARE SCREENING TOOL     IDENTIFICATION  Patient Name: Ashlee Reyes Birthdate: September 11, 1921 Sex: female Admission Date (Current Location): 07/26/2022  [name] and [place] Number:  Engineering geologist and Address:  [facility], [address] [place], [place] [address]      Provider Number: [id number]  Attending Physician Name and Address:  Edwin Dada, *  Relative Name and Phone Number:  [name] (Daughter)   [phone] [facility] Phone)    Current Level of Care: Hospital Recommended Level of Care: [place] Prior Approval Number:    Date Approved/Denied:   PASRR Number: [id number]  Discharge Plan:      Current Diagnoses: Patient Active Problem List   Diagnosis Date Noted   Cerebrovascular disease 07/27/2022   Essential hypertension 07/27/2022   Low back pain and ambulatory dysfunction 07/26/2022   Dementia, old age ([place]) 08/10/2021   Thickened nails 08/10/2021   Dysphagia 03/21/2021   Vitamin B12 deficiency 10/12/2020   Nausea 09/08/2020   Fatigue 09/08/2020   Current use of proton pump inhibitor 09/08/2020   Labile blood pressure 07/31/2019   Allergic rhinitis 02/20/2019   Palliative care status 01/12/2019   Hypertensive urgency 12/24/2018   History of compression fracture of spine 09/26/2018   Mobility impaired 09/26/2018   History of endovascular stent graft for abdominal aortic aneurysm (AAA) 01/01/2018   Unilateral primary osteoarthritis, left knee 07/11/2017   Hyperlipidemia 03/12/2017   Frequent UTI/interstitial cystitis 02/06/2016   Hypotension 01/09/2016   Carotid stenosis 06/01/2015   Local reaction to immunization 02/21/2015   Encounter for Medicare annual wellness exam 02/15/2015   Estrogen deficiency 02/15/2015   Sinusitis, chronic 01/12/2015   IBS (irritable bowel syndrome) 01/12/2015   Orthostatic hypotension 01/11/2015   Palpitations 05/13/2014    Second degree heart block 10/18/2013   Aneurysm of abdominal vessel ([place]) 06/25/2013   Osteopenia 01/13/2013   CAD (coronary artery disease)    Chronic depression 06/03/2007   GERD 06/02/2007   Asthma    Chronic interstitial cystitis    Osteoarthritis    Spinal stenosis of lumbar region    History of urinary tract infection     Orientation RESPIRATION BLADDER Height & Weight     Self  Normal Incontinent, External catheter Weight: 175 lb (79.4 kg) Height:  '5\' 7"'$  (170.2 cm)  BEHAVIORAL SYMPTOMS/MOOD NEUROLOGICAL BOWEL NUTRITION STATUS      Incontinent Diet (heart)  AMBULATORY STATUS COMMUNICATION OF NEEDS Skin   Limited Assist Verbally Normal                       Personal Care Assistance Level of Assistance  Bathing, Feeding, Dressing Bathing Assistance: Limited assistance Feeding assistance: Limited assistance Dressing Assistance: Limited assistance     Functional Limitations Info             SPECIAL CARE FACTORS FREQUENCY  PT (By licensed PT), OT (By licensed OT)     PT Frequency: 5 times per week OT Frequency: 5 times per week            Contractures      Additional Factors Info  Code Status, Allergies Code Status Info: full Allergies Info: Amoxicillin-pot Clavulanate, Avelox (Moxifloxacin), Flovent Hfa (Fluticasone), Ibuprofen, Keflex (Cephalexin), Nitrofurantoin, Omeprazole, Prevnar (Pneumococcal 13-val Conj Vacc), Rosuvastatin, Sertraline Hcl, Tetanus Toxoids           Current Medications (07/27/2022):  This is the current hospital active medication list  Current Facility-Administered Medications  Medication Dose Route Frequency Provider Last Rate Last Admin   acetaminophen (TYLENOL) tablet 650 mg  650 mg Oral Q6H PRN Hugelmeyer, Alexis, DO       Or   acetaminophen (TYLENOL) suppository 650 mg  650 mg Rectal Q6H PRN Hugelmeyer, Alexis, DO       bisacodyl (DULCOLAX) EC tablet 5 mg  5 mg Oral Daily PRN Hugelmeyer, Alexis, DO       enoxaparin  (LOVENOX) injection 40 mg  40 mg Subcutaneous Q24H Hugelmeyer, Alexis, DO   40 mg at 07/27/22 1128   HYDROcodone-acetaminophen (NORCO/VICODIN) 5-325 MG per tablet 1-2 tablet  1-2 tablet Oral Q4H PRN Hugelmeyer, Alexis, DO   1 tablet at 07/27/22 0503   lidocaine (LIDODERM) 5 % 1 patch  1 patch Transdermal Q24H Poggi, Jenna E, PA-C   1 patch at 07/26/22 1611   ondansetron (ZOFRAN) tablet 4 mg  4 mg Oral Q6H PRN Hugelmeyer, Alexis, DO       senna-docusate (Senokot-S) tablet 1 tablet  1 tablet Oral QHS PRN Hugelmeyer, Alexis, DO       sodium chloride flush (NS) 0.9 % injection 3 mL  3 mL Intravenous Q12H Hugelmeyer, Alexis, DO   3 mL at 07/27/22 1129   traZODone (DESYREL) tablet 25 mg  25 mg Oral QHS PRN Hugelmeyer, Alexis, DO       Current Outpatient Medications  Medication Sig Dispense Refill   Cholecalciferol (VITAMIN D3) 25 MCG (1000 UT) CAPS Take 1 capsule by mouth daily.     clopidogrel (PLAVIX) 75 MG tablet TAKE 1 TABLET(75 MG) BY MOUTH DAILY 90 tablet 3   esomeprazole (NEXIUM) 40 MG capsule TAKE 1 CAPSULE(40 MG) BY MOUTH ONCE DAILY BEFORE A MEAL 90 capsule 1   famotidine (PEPCID) 40 MG tablet TAKE 1 TABLET BY MOUTH EVERY DAY 30 tablet 5   fluticasone (FLONASE) 50 MCG/ACT nasal spray Place 2 sprays into both nostrils daily. 16 g 11   LUMIGAN 0.01 % SOLN Place 1 drop into both eyes at bedtime.     Probiotic Product (ALIGN) 4 MG CAPS Take 1 capsule (4 mg total) by mouth daily.     traMADol (ULTRAM) 50 MG tablet TAKE 1/2 TABLET BY MOUTH EVERY MORNING THEN TAKE 1 TABLET BY MOUTH AT BEDTIME 36 tablet 1   trimethoprim (TRIMPEX) 100 MG tablet Take 1 tablet (100 mg total) by mouth daily with lunch. 30 tablet 0   vitamin B-12 (CYANOCOBALAMIN) 1000 MCG tablet Take 1,000 mcg by mouth daily.     acetaminophen (TYLENOL) 500 MG tablet Take 1 tablet (500 mg total) by mouth every 4 (four) hours as needed for mild pain. 30 tablet 0   albuterol (VENTOLIN HFA) 108 (90 Base) MCG/ACT inhaler INHALE 2 PUFFS BY  MOUTH EVERY 4 HOURS AS NEEDED FOR WHEEZING 18 g 1   amLODipine (NORVASC) 2.5 MG tablet Take one pill by mouth daily as needed for elevated blood pressure (over 145/90) 30 tablet 11   cetirizine (ZYRTEC) 10 MG tablet TAKE 1 TABLET(10 MG) BY MOUTH DAILY AS NEEDED 30 tablet 11   docusate sodium (COLACE) 100 MG capsule Take 1 capsule (100 mg total) by mouth every evening. 10 capsule 0   lactase (LACTAID) 3000 units tablet Take 3,000 Units by mouth 3 (three) times daily as needed.     loratadine (CLARITIN) 10 MG tablet Take 10 mg by mouth daily as needed.     ondansetron (ZOFRAN-ODT) 4 MG disintegrating tablet DISSOLVE 1 TABLET(4 MG)  ON THE TONGUE EVERY 8 HOURS AS NEEDED FOR NAUSEA OR VOMITING 90 tablet 3   Polyethyl Glycol-Propyl Glycol (SYSTANE OP) Place 1 drop into both eyes every 8 (eight) hours as needed (dry eyes).      polyethylene glycol (MIRALAX / GLYCOLAX) packet Take 17 g by mouth daily as needed for mild constipation. 14 each 0     Discharge Medications: Please see discharge summary for a list of discharge medications.  Relevant Imaging Results:  Relevant Lab Results:   Additional Information SS #: [place], LCSW

## 2022-07-27 NOTE — Evaluation (Addendum)
Occupational Therapy Evaluation Patient Details Name: Ashlee Reyes MRN: [id number] DOB: [birth year] Today's Date: 07/27/2022   History of Present Illness 88 y.o. female with a known history of dementia, asthma, atrial fibrillation, coronary artery disease, GERD, hyperlipidemia, osteopenia presents to the emergency department for evaluation of fall fall.  Patient was in a usual state of health until today she had an unwitnessed fall at home.  Family believes that patient slipped in the kitchen.Test results show remote superior endplate fracture of L1 is stable.   Clinical Impression   Patient presenting with decreased Ind in self care, balance, functional mobility/transfers, endurance, and safety awareness. Patient is cognitively impaired at baseline and her caregiver, Daughter, present to confirm baseline. PT is able to ambulate with RW inside of home and in community without physical assistance. She does need some cognitive cuing for bathing and dressing at baseline. Pt performing bed mobility with min guard and stands with min A to transfer to [facility]. Pt needing assistance for clothing management and hygiene in standing. Noted pt with bloody urine and RN notified. Pt returning to bed to rest with family remaining present in room.   Patient will benefit from acute OT to increase overall independence in the areas of ADLs, functional mobility, and safety awareness in order to safely discharge to next venue of care.     Recommendations for follow up therapy are one component of a multi-disciplinary discharge planning process, led by the attending physician.  Recommendations may be updated based on patient status, additional functional criteria and insurance authorization.   Follow Up Recommendations  Skilled nursing-short term rehab (<3 hours/day)    Assistance Recommended at Discharge Frequent or constant Supervision/Assistance  Patient can return home with the following Assistance with  cooking/housework;Help with stairs or ramp for entrance;A little help with walking and/or transfers;A lot of help with bathing/dressing/bathroom;Assist for transportation;Direct supervision/assist for medications management;Direct supervision/assist for financial management    Functional Status Assessment  Patient has had a recent decline in their functional status and demonstrates the ability to make significant improvements in function in a reasonable and predictable amount of time.  Equipment Recommendations  Other (comment) (defer to next venue of care)       Precautions / Restrictions Precautions Precautions: Fall      Mobility Bed Mobility Overal bed mobility: Needs Assistance Bed Mobility: Supine to Sit, Sit to Supine     Supine to sit: Min guard, HOB elevated Sit to supine: Min guard, HOB elevated   General bed mobility comments: increased time to complete task and cuing for technique and hand placement    Transfers Overall transfer level: Needs assistance Equipment used: Rolling walker (2 wheels) Transfers: Sit to/from Stand, Bed to chair/wheelchair/BSC Sit to Stand: Min guard     Step pivot transfers: Min guard            Balance Overall balance assessment: Needs assistance Sitting-balance support: Feet supported Sitting balance-Leahy Scale: Good     Standing balance support: Reliant on assistive device for balance, During functional activity, Bilateral upper extremity supported Standing balance-Leahy Scale: Fair                             ADL either performed or assessed with clinical judgement   ADL Overall ADL's : Needs assistance/impaired                         Toilet Transfer:  Minimal assistance;BSC/3in1;Rolling walker (2 wheels)   Toileting- Clothing Manipulation and Hygiene: Sit to/from stand;Minimal assistance       Functional mobility during ADLs: Minimal assistance;Rolling walker (2 wheels)       Vision  Patient Visual Report: No change from baseline              Pertinent Vitals/Pain Pain Assessment Pain Assessment: No/denies pain     Hand Dominance Right   Extremity/Trunk Assessment Upper Extremity Assessment Upper Extremity Assessment: Generalized weakness;Overall [facility] for tasks assessed   Lower Extremity Assessment Lower Extremity Assessment: Overall WFL for tasks assessed;Generalized weakness       Communication Communication Communication: HOH   Cognition Arousal/Alertness: Awake/alert Behavior During Therapy: Flat affect Overall Cognitive Status: History of cognitive impairments - at baseline                                 General Comments: Caregiver, Daughter, did report that congitive pt is far from baseline. Pt follows 1 step commands and is very pleasant                Home Living Family/patient expects to be discharged to:: Private residence Living Arrangements: Children Available Help at Discharge: Family;Available 24 hours/day Type of Home: House Home Access: Ramped entrance     Home Layout: One level     Bathroom Shower/Tub: Hospital doctor Toilet: Handicapped height     Home Equipment: Conservation officer, nature (2 wheels);Grab bars - tub/shower;Shower seat;Other (comment)   Additional Comments: transport chair      Prior Functioning/Environment Prior Level of Function : Needs assist             Mobility Comments: Pt's daughter reports pt ambulates with RW in home and in community. She has had several falls lately. ADLs Comments: Pt needs assistance for self care secondary to cognition        OT Problem List: Decreased strength;Decreased activity tolerance;Decreased safety awareness;Impaired balance (sitting and/or standing);Decreased knowledge of use of DME or AE;Decreased cognition      OT Treatment/Interventions: Self-care/ADL training;Therapeutic exercise;Therapeutic activities;Energy conservation;DME and/or AE  instruction;Manual therapy;Balance training;Patient/family education;Cognitive remediation/compensation    OT Goals(Current goals can be found in the care plan section) Acute Rehab OT Goals Patient Stated Goal: to get stronger and go to rehab OT Goal Formulation: With patient/family Time For Goal Achievement: 08/10/22 Potential to Achieve Goals: Good ADL Goals Pt Will Perform Grooming: with supervision;standing Pt Will Perform Lower Body Dressing: with min guard assist;sit to/from stand Pt Will Transfer to Toilet: with supervision;ambulating Pt Will Perform Toileting - Clothing Manipulation and hygiene: with min guard assist;sit to/from stand  OT Frequency: Min 2X/week       AM-PAC OT "6 Clicks" Daily Activity     Outcome Measure Help from another person eating meals?: None Help from another person taking care of personal grooming?: A Little Help from another person toileting, which includes using toliet, bedpan, or urinal?: A Lot Help from another person bathing (including washing, rinsing, drying)?: A Lot Help from another person to put on and taking off regular upper body clothing?: A Little Help from another person to put on and taking off regular lower body clothing?: A Lot 6 Click Score: 16   End of Session Equipment Utilized During Treatment: Rolling walker (2 wheels) Nurse Communication: Mobility status;Other (comment) (bloody urine)  Activity Tolerance: Patient tolerated treatment well Patient left: in bed;with call bell/phone within  reach;with bed alarm set;with family/visitor present  OT Visit Diagnosis: Unsteadiness on feet (R26.81);Repeated falls (R29.6);Muscle weakness (generalized) (M62.81);History of falling (Z91.81)                Time: 1055-1105 OT Time Calculation (min): 10 min Charges:  OT General Charges $OT Visit: 1 Visit OT Evaluation $OT Eval Moderate Complexity: 1 [address] [place], OTR/L , CBIS ascom [phone]  07/27/22, 1:47 PM

## 2022-07-27 NOTE — Assessment & Plan Note (Signed)
Not on statin 

## 2022-07-27 NOTE — Assessment & Plan Note (Signed)
PT/OT

## 2022-07-27 NOTE — Assessment & Plan Note (Signed)
-   Continue home trimethoprim - See above regarding urinary retention

## 2022-07-27 NOTE — Assessment & Plan Note (Addendum)
Blood pressure controlled - Continue amlodipine 

## 2022-07-28 DIAGNOSIS — Z8781 Personal history of (healed) traumatic fracture: Secondary | ICD-10-CM | POA: Diagnosis not present

## 2022-07-28 DIAGNOSIS — Z85828 Personal history of other malignant neoplasm of skin: Secondary | ICD-10-CM | POA: Diagnosis not present

## 2022-07-28 DIAGNOSIS — R2681 Unsteadiness on feet: Secondary | ICD-10-CM | POA: Diagnosis not present

## 2022-07-28 DIAGNOSIS — R0602 Shortness of breath: Secondary | ICD-10-CM | POA: Diagnosis not present

## 2022-07-28 DIAGNOSIS — M48061 Spinal stenosis, lumbar region without neurogenic claudication: Secondary | ICD-10-CM | POA: Diagnosis not present

## 2022-07-28 DIAGNOSIS — F0390 Unspecified dementia without behavioral disturbance: Secondary | ICD-10-CM | POA: Diagnosis not present

## 2022-07-28 DIAGNOSIS — N3010 Interstitial cystitis (chronic) without hematuria: Secondary | ICD-10-CM | POA: Diagnosis not present

## 2022-07-28 DIAGNOSIS — R338 Other retention of urine: Secondary | ICD-10-CM | POA: Diagnosis not present

## 2022-07-28 DIAGNOSIS — R4182 Altered mental status, unspecified: Secondary | ICD-10-CM | POA: Diagnosis not present

## 2022-07-28 DIAGNOSIS — H9193 Unspecified hearing loss, bilateral: Secondary | ICD-10-CM | POA: Diagnosis not present

## 2022-07-28 DIAGNOSIS — J45909 Unspecified asthma, uncomplicated: Secondary | ICD-10-CM | POA: Diagnosis not present

## 2022-07-28 DIAGNOSIS — S3992XA Unspecified injury of lower back, initial encounter: Secondary | ICD-10-CM | POA: Diagnosis not present

## 2022-07-28 DIAGNOSIS — K219 Gastro-esophageal reflux disease without esophagitis: Secondary | ICD-10-CM | POA: Diagnosis not present

## 2022-07-28 DIAGNOSIS — J309 Allergic rhinitis, unspecified: Secondary | ICD-10-CM | POA: Diagnosis not present

## 2022-07-28 DIAGNOSIS — M25562 Pain in left knee: Secondary | ICD-10-CM | POA: Diagnosis not present

## 2022-07-28 DIAGNOSIS — R5381 Other malaise: Secondary | ICD-10-CM | POA: Diagnosis not present

## 2022-07-28 DIAGNOSIS — I679 Cerebrovascular disease, unspecified: Secondary | ICD-10-CM | POA: Diagnosis not present

## 2022-07-28 DIAGNOSIS — I714 Abdominal aortic aneurysm, without rupture, unspecified: Secondary | ICD-10-CM | POA: Diagnosis not present

## 2022-07-28 DIAGNOSIS — F29 Unspecified psychosis not due to a substance or known physiological condition: Secondary | ICD-10-CM | POA: Diagnosis not present

## 2022-07-28 DIAGNOSIS — E785 Hyperlipidemia, unspecified: Secondary | ICD-10-CM | POA: Diagnosis not present

## 2022-07-28 DIAGNOSIS — G8929 Other chronic pain: Secondary | ICD-10-CM | POA: Diagnosis not present

## 2022-07-28 DIAGNOSIS — I10 Essential (primary) hypertension: Secondary | ICD-10-CM | POA: Diagnosis not present

## 2022-07-28 DIAGNOSIS — I951 Orthostatic hypotension: Secondary | ICD-10-CM | POA: Diagnosis not present

## 2022-07-28 DIAGNOSIS — R410 Disorientation, unspecified: Secondary | ICD-10-CM | POA: Diagnosis not present

## 2022-07-28 DIAGNOSIS — I4891 Unspecified atrial fibrillation: Secondary | ICD-10-CM | POA: Diagnosis not present

## 2022-07-28 DIAGNOSIS — I25111 Atherosclerotic heart disease of native coronary artery with angina pectoris with documented spasm: Secondary | ICD-10-CM | POA: Diagnosis not present

## 2022-07-28 DIAGNOSIS — Z9181 History of falling: Secondary | ICD-10-CM | POA: Diagnosis not present

## 2022-07-28 DIAGNOSIS — M549 Dorsalgia, unspecified: Secondary | ICD-10-CM | POA: Diagnosis not present

## 2022-07-28 DIAGNOSIS — I2510 Atherosclerotic heart disease of native coronary artery without angina pectoris: Secondary | ICD-10-CM | POA: Diagnosis not present

## 2022-07-28 DIAGNOSIS — M542 Cervicalgia: Secondary | ICD-10-CM | POA: Diagnosis not present

## 2022-07-28 DIAGNOSIS — M1712 Unilateral primary osteoarthritis, left knee: Secondary | ICD-10-CM | POA: Diagnosis not present

## 2022-07-28 DIAGNOSIS — M545 Low back pain, unspecified: Secondary | ICD-10-CM | POA: Diagnosis not present

## 2022-07-28 DIAGNOSIS — Z7401 Bed confinement status: Secondary | ICD-10-CM | POA: Diagnosis not present

## 2022-07-28 MED ORDER — LORATADINE 10 MG PO TABS
10.0000 mg | ORAL_TABLET | Freq: Every day | ORAL | Status: DC
  Administered 2022-07-28: 10 mg via ORAL
  Filled 2022-07-28: qty 1

## 2022-07-28 MED ORDER — TRAMADOL HCL 50 MG PO TABS: 50.0000 mg | ORAL_TABLET | Freq: Every day | ORAL | 0 refills | Status: AC

## 2022-07-28 NOTE — Progress Notes (Signed)
Discharge report called to [facility] [phone], report given to receiving nurse Hinton Dyer, LPN.  AVS, DNR and script have been printed and signed, placed in discharge packet for [facility].

## 2022-07-28 NOTE — TOC Transition Note (Addendum)
Transition of Care [facility]) - CM/SW Discharge Note   Patient Details  Name: Ashlee Reyes MRN: [id number] Date of Birth: Aug 07, 1921  Transition of Care [facility]) CM/SW Contact:  Izola Price, RN Phone Number: 07/28/2022, 10:23 AM   Clinical Narrative:  8/12: To be discharged today with EMS transfer to [place] [facility], [place]. Discharge summary inboxed via Hub. Facility contacted to verify accepting bed. Report to be called to [place] at [phone]. Room 205. Simmie Davies RN CM    [facility] to transport patient per [facility]. EMS forms corrected and printed to Unit printer. DNR form signed. Simmie Davies RN CM   Final next level of care: Skilled Nursing Facility Barriers to Discharge: Barriers Resolved   Patient Goals and CMS Choice Patient states their goals for this hospitalization and ongoing recovery are:: SNF CMS Medicare.gov Compare Post Acute Care list provided to:: Patient Represenative (must comment) Choice offered to / list presented to : Adult Children  Discharge Placement                       Discharge Plan and Services                DME Arranged: N/A DME Agency: NA       HH Arranged: NA          Social Determinants of Health (SDOH) Interventions     Readmission Risk Interventions     No data to display

## 2022-07-28 NOTE — Plan of Care (Signed)

## 2022-07-28 NOTE — Discharge Summary (Signed)
Physician Discharge Summary   Patient: Ashlee Reyes MRN: [id number] DOB: 1921-07-19  Admit date:     07/26/2022  Discharge date: 07/28/22  Discharge Physician: Edwin Dada   PCP: Abner Greenspan, MD     Recommendations at discharge:  Follow up with PCP [facility]: Please follow up urine culture from hospital and if develops symptoms of UTI, please start oral antibiotics     Discharge Diagnoses: Principal Problem:   Low back pain and ambulatory dysfunction Active Problems:   Chronic interstitial cystitis   Aneurysm of abdominal vessel (HCC)   Hyperlipidemia   Dementia, old age [facility])   Cerebrovascular disease   Essential hypertension   Acute urinary retention      Hospital Course: Ashlee Reyes is a 88 y.o. F with dementia, lives at home, HTN, orthostatic hypotension, hx CVA, interstitial cystitis, hx PVD s/p AAA endovascular repair 2014 who presented with fall.  Patient was standing up from the table at home, lost her balance and fell.  Family called 9-1-1.  In the ER, she was unable to ambulate due to pain initially.      Ambulatory dysfunction She had computed tomography imaging of the head, C/T/L-spine, chest/abdomen and pelvis without evidence of acute fracture, traumatic subluxation of the spine, or other acute finding.  Suspect the cause of her limited mobility is her age in the setting of some contusions.  She was evaluated by PT who felt she was severely limited relative to her baseline, and recommended SNF.   Acute urinary retention Had some transient urinary retention, in setting of opiates and benzodiazepines.  This appears to have resolved, bladder scan overnight normal     Chronic interstitial cystitis Continue trimethoprim.  Urinalysis on admission normal.  Repeat UA the morning after admission with pyuria.  Urine culture sent.  No symptoms to suggest UTI at this time.              The [facility] Controlled Substances  Registry was reviewed for this patient prior to discharge.   Disposition: Skilled nursing facility Diet recommendation:  Discharge Diet Orders (From admission, onward)     Start     Ordered   07/28/22 0000  Diet - low sodium heart healthy        07/28/22 1005             DISCHARGE MEDICATION: Allergies as of 07/28/2022       Reactions   Amoxicillin-pot Clavulanate Nausea And Vomiting   Has patient had a PCN reaction causing immediate rash, facial/tongue/throat swelling, SOB or lightheadedness with hypotension: No Has patient had a PCN reaction causing severe rash involving mucus membranes or skin necrosis: No Has patient had a PCN reaction that required hospitalization: No Has patient had a PCN reaction occurring within the last 10 years: No If all of the above answers are "NO", then may proceed with Cephalosporin use.   Avelox [moxifloxacin] Nausea And Vomiting   Flovent Hfa [fluticasone] Other (See Comments)   "makes her feel funny" per family member   Ibuprofen Other (See Comments)   GI upset   Keflex [cephalexin] Other (See Comments)   abd pain / GI upset   Nitrofurantoin Other (See Comments)   Chest pain or indigestion with nausea   Omeprazole Nausea Only   Prevnar [pneumococcal 13-val Conj Vacc] Itching   Severe local reaction with redness and itching    Rosuvastatin Other (See Comments)   Foot pain   Sertraline Hcl Other (See  Comments)   Made her more depressed   Tetanus Toxoids    Local redness and swelling         Medication List     TAKE these medications    acetaminophen 500 MG tablet Commonly known as: TYLENOL Take 1 tablet (500 mg total) by mouth every 4 (four) hours as needed for mild pain.   albuterol 108 (90 Base) MCG/ACT inhaler Commonly known as: VENTOLIN HFA INHALE 2 PUFFS BY MOUTH EVERY 4 HOURS AS NEEDED FOR WHEEZING   Align 4 MG Caps Take 1 capsule (4 mg total) by mouth daily.   amLODipine 2.5 MG tablet Commonly known as:  NORVASC Take one pill by mouth daily as needed for elevated blood pressure (over 145/90)   cetirizine 10 MG tablet Commonly known as: ZYRTEC TAKE 1 TABLET(10 MG) BY MOUTH DAILY AS NEEDED   clopidogrel 75 MG tablet Commonly known as: PLAVIX TAKE 1 TABLET(75 MG) BY MOUTH DAILY   cyanocobalamin 1000 MCG tablet Commonly known as: VITAMIN B12 Take 1,000 mcg by mouth daily.   docusate sodium 100 MG capsule Commonly known as: COLACE Take 1 capsule (100 mg total) by mouth every evening.   esomeprazole 40 MG capsule Commonly known as: NEXIUM TAKE 1 CAPSULE(40 MG) BY MOUTH ONCE DAILY BEFORE A MEAL   famotidine 40 MG tablet Commonly known as: PEPCID TAKE 1 TABLET BY MOUTH EVERY DAY   fluticasone 50 MCG/ACT nasal spray Commonly known as: Flonase Place 2 sprays into both nostrils daily.   lactase 3000 units tablet Commonly known as: LACTAID Take 3,000 Units by mouth 3 (three) times daily as needed.   loratadine 10 MG tablet Commonly known as: CLARITIN Take 10 mg by mouth daily as needed.   Lumigan 0.01 % Soln Generic drug: bimatoprost Place 1 drop into both eyes at bedtime.   ondansetron 4 MG disintegrating tablet Commonly known as: ZOFRAN-ODT DISSOLVE 1 TABLET(4 MG) ON THE TONGUE EVERY 8 HOURS AS NEEDED FOR NAUSEA OR VOMITING   polyethylene glycol 17 g packet Commonly known as: MIRALAX / GLYCOLAX Take 17 g by mouth daily as needed for mild constipation.   SYSTANE OP Place 1 drop into both eyes every 8 (eight) hours as needed (dry eyes).   traMADol 50 MG tablet Commonly known as: ULTRAM Take 1 tablet (50 mg total) by mouth at bedtime. What changed: See the new instructions.   trimethoprim 100 MG tablet Commonly known as: TRIMPEX Take 1 tablet (100 mg total) by mouth daily with lunch.   Vitamin D3 25 MCG (1000 UT) Caps Take 1 capsule by mouth daily.         Discharge Instructions     Diet - low sodium heart healthy   Complete by: As directed    Increase  activity slowly   Complete by: As directed        Discharge Exam: Filed Weights   07/26/22 1407 07/26/22 1413  Weight: 79.4 kg 79.4 kg    General: Pt is alert, awake, not in acute distress, lying in bed, rouses easily and is interactiv Cardiovascular: RRR, nl S1-S2, no murmurs appreciated.   No LE edema.   Respiratory: Normal respiratory rate and rhythm.  CTAB without rales or wheezes. Abdominal: Abdomen soft and non-tender.  No distension or HSM.   Neuro/Psych: Strength symmetric in upper and lower extremities.  Judgment and insight appear severely impaired by dementia, hard of hearing.   Condition at discharge: fair  The results of significant diagnostics from this hospitalization (  including imaging, microbiology, ancillary and laboratory) are listed below for reference.   Imaging Studies: CT Head Wo Contrast  Result Date: 07/26/2022 CLINICAL DATA:  Fall, head trauma EXAM: CT HEAD WITHOUT CONTRAST CT CERVICAL SPINE WITHOUT CONTRAST TECHNIQUE: Multidetector CT imaging of the head and cervical spine was performed following the standard protocol without intravenous contrast. Multiplanar CT image reconstructions of the cervical spine were also generated. RADIATION DOSE REDUCTION: This exam was performed according to the departmental dose-optimization program which includes automated exposure control, adjustment of the mA and/or kV according to patient size and/or use of iterative reconstruction technique. COMPARISON:  CT head 12/24/2018; no prior dedicated CT of the cervical spine correlation is made with 12/23/2018 CTA neck FINDINGS: Evaluation is somewhat limited by motion artifact. CT HEAD FINDINGS Brain: No evidence of acute infarct, hemorrhage, mass, mass effect, or midline shift. No hydrocephalus or new extra-axial collection. Vascular: No hyperdense vessel. Atherosclerotic calcifications in the intracranial carotid and vertebral arteries. Skull: Normal. Negative for fracture or focal  lesion. Sinuses/Orbits: Mucous retention cysts in the maxillary sinuses. Status post bilateral lens replacements. Other: The mastoid air cells are well aerated. CT CERVICAL SPINE FINDINGS Alignment: Levocurvature of the cervical spine, which may be positional. No listhesis. Skull base and vertebrae: No acute fracture. No primary bone lesion or focal pathologic process. Soft tissues and spinal canal: No prevertebral fluid or swelling. No visible canal hematoma. Disc levels: Multilevel degenerative changes without high-grade spinal canal stenosis. Multilevel uncovertebral and facet arthropathy, which causes severe neural foraminal narrowing on the right at C3-C4 and C4-C5. Upper chest: Please see same-day CT chest. Other: None. IMPRESSION: 1. No acute intracranial process. 2. No acute fracture or traumatic listhesis in the cervical spine. Electronically Signed   By: Merilyn Baba M.D.   On: 07/26/2022 18:38   CT Cervical Spine Wo Contrast  Result Date: 07/26/2022 CLINICAL DATA:  Fall, head trauma EXAM: CT HEAD WITHOUT CONTRAST CT CERVICAL SPINE WITHOUT CONTRAST TECHNIQUE: Multidetector CT imaging of the head and cervical spine was performed following the standard protocol without intravenous contrast. Multiplanar CT image reconstructions of the cervical spine were also generated. RADIATION DOSE REDUCTION: This exam was performed according to the departmental dose-optimization program which includes automated exposure control, adjustment of the mA and/or kV according to patient size and/or use of iterative reconstruction technique. COMPARISON:  CT head 12/24/2018; no prior dedicated CT of the cervical spine correlation is made with 12/23/2018 CTA neck FINDINGS: Evaluation is somewhat limited by motion artifact. CT HEAD FINDINGS Brain: No evidence of acute infarct, hemorrhage, mass, mass effect, or midline shift. No hydrocephalus or new extra-axial collection. Vascular: No hyperdense vessel. Atherosclerotic  calcifications in the intracranial carotid and vertebral arteries. Skull: Normal. Negative for fracture or focal lesion. Sinuses/Orbits: Mucous retention cysts in the maxillary sinuses. Status post bilateral lens replacements. Other: The mastoid air cells are well aerated. CT CERVICAL SPINE FINDINGS Alignment: Levocurvature of the cervical spine, which may be positional. No listhesis. Skull base and vertebrae: No acute fracture. No primary bone lesion or focal pathologic process. Soft tissues and spinal canal: No prevertebral fluid or swelling. No visible canal hematoma. Disc levels: Multilevel degenerative changes without high-grade spinal canal stenosis. Multilevel uncovertebral and facet arthropathy, which causes severe neural foraminal narrowing on the right at C3-C4 and C4-C5. Upper chest: Please see same-day CT chest. Other: None. IMPRESSION: 1. No acute intracranial process. 2. No acute fracture or traumatic listhesis in the cervical spine. Electronically Signed   By: Merilyn Baba  M.D.   On: 07/26/2022 18:38   CT T-SPINE NO CHARGE  Result Date: 07/26/2022 CLINICAL DATA:  Mechanical fall.  Back pain. EXAM: CT THORACIC SPINE WITHOUT CONTRAST TECHNIQUE: Multidetector CT images of the thoracic were obtained using the standard protocol without intravenous contrast. RADIATION DOSE REDUCTION: This exam was performed according to the departmental dose-optimization program which includes automated exposure control, adjustment of the mA and/or kV according to patient size and/or use of iterative reconstruction technique. COMPARISON:  Two-view chest x-ray 12/23/2018.  CTA chest 10/17/2013 FINDINGS: Alignment: No significant listhesis is present. Focal kyphosis noted at T12-L1. Vertebrae: Vertebral body heights are maintained. No acute fractures are present. Fused anterior osteophytes are present T2 through T12. Paraspinal and other soft tissues: See dedicated report for CT of the chest and abdomen. Disc levels: No  significant central canal stenosis is present. Right foraminal stenosis is present at T9-10 secondary to asymmetric right-sided facet spurring the foramina are otherwise patent. IMPRESSION: 1. No acute fracture or traumatic subluxation. 2. Fused anterior osteophytes T2 through T12 consistent with DISH. 3. Right foraminal stenosis at T9-10 secondary to asymmetric right-sided facet spurring. Electronically Signed   By: San Morelle M.D.   On: 07/26/2022 18:27   CT L-SPINE NO CHARGE  Result Date: 07/26/2022 CLINICAL DATA:  Mechanical fall.  Low back pain. EXAM: CT LUMBAR SPINE WITHOUT CONTRAST TECHNIQUE: Multidetector CT imaging of the lumbar spine was performed without intravenous contrast administration. Multiplanar CT image reconstructions were also generated. RADIATION DOSE REDUCTION: This exam was performed according to the departmental dose-optimization program which includes automated exposure control, adjustment of the mA and/or kV according to patient size and/or use of iterative reconstruction technique. COMPARISON:  CT of the abdomen and pelvis 09/18/2018 FINDINGS: Segmentation: 5 non rib-bearing lumbar type vertebral bodies are present. The lowest fully formed vertebral body is L5. Alignment: Grade 1 anterolisthesis L3-4 and L4-5 is stable. Leftward curvature is centered at L4, also stable. Vertebrae: The remote superior endplate fracture of L1 is stable. Asymmetric sclerotic changes are again noted on the right at L4-5. Paraspinal and other soft tissues: See full report of CT abdomen and pelvis. Disc levels: T12-L1: No significant stenosis. L1-2: Moderate facet hypertrophy present. Mild to moderate foraminal stenosis is noted bilaterally. L2-3: A broad-based disc protrusion endplate spurring and facet hypertrophy results in mild central and moderate foraminal stenosis bilaterally. L3-4: A broad-based disc protrusion is present. Advanced facet hypertrophy is noted. Moderate central and bilateral  foraminal narrowing is present, left greater than right. L4-5: Vacuum disc is present. Broad-based disc protrusion and advanced facet hypertrophy results in moderate central canal stenosis. Moderate foraminal narrowing is worse on the right. L5-S1: Ankylosis noted.  No significant stenosis is present. IMPRESSION: 1. No acute fracture. 2. Remote superior endplate fracture of L1 is stable. 3. Stable grade 1 anterolisthesis L3-4 and L4-5 secondary to advanced facet hypertrophy and a broad-based disc protrusion with moderate central and bilateral foraminal stenosis at L4-5. 4. Mild central and moderate foraminal stenosis bilaterally at L2-3. 5. Moderate central and bilateral foraminal stenosis at L3-4. 6. Moderate foraminal narrowing bilaterally at L4-5 is worse on the right. 7. Ankylosis at L5-S1. Electronically Signed   By: San Morelle M.D.   On: 07/26/2022 18:23   CT CHEST ABDOMEN PELVIS W CONTRAST  Result Date: 07/26/2022 CLINICAL DATA:  [place], lower back pain, dementia EXAM: CT CHEST, ABDOMEN, AND PELVIS WITH CONTRAST TECHNIQUE: Multidetector CT imaging of the chest, abdomen and pelvis was performed following the standard protocol during  bolus administration of intravenous contrast. RADIATION DOSE REDUCTION: This exam was performed according to the departmental dose-optimization program which includes automated exposure control, adjustment of the mA and/or kV according to patient size and/or use of iterative reconstruction technique. CONTRAST:  12m OMNIPAQUE IOHEXOL 350 MG/ML SOLN COMPARISON:  09/18/2018 FINDINGS: CT CHEST FINDINGS Cardiovascular: The heart is unremarkable without pericardial effusion. No evidence of thoracic aortic aneurysm or dissection. Atherosclerosis of the aorta and coronary vasculature. Calcifications of the mitral and aortic valves. Mediastinum/Nodes: No enlarged mediastinal, hilar, or axillary lymph nodes. Thyroid gland, trachea, and esophagus demonstrate no significant  findings. Lungs/Pleura: 11 mm ground-glass nodule within the right upper lobe image 66/2 has been present since 2014, without significant interval change. No acute airspace disease, effusion, or pneumothorax. Central airways are patent. Musculoskeletal: No acute or destructive bony lesions. Reconstructed images demonstrate no additional findings. CT ABDOMEN PELVIS FINDINGS Hepatobiliary: No hepatic injury or perihepatic hematoma. Gallbladder is unremarkable. Pancreas: Unremarkable. No pancreatic ductal dilatation or surrounding inflammatory changes. Spleen: No splenic injury or perisplenic hematoma. Adrenals/Urinary Tract: Bilateral simple appearing cortical and peripelvic renal cysts are noted. No specific imaging follow-up is required. Bilateral renal cortical thinning. No focal solid lesion. The adrenals and bladder are grossly unremarkable. Stomach/Bowel: No bowel obstruction or ileus. Postsurgical changes from partial right hemicolectomy. There is marked diverticulosis of the distal rectosigmoid colon, with no evidence of acute diverticulitis. Vascular/Lymphatic: Postsurgical changes from prior endoluminal stent graft repair of an abdominal aortic aneurysm. No evidence of aneurysm on this exam. Diffuse atherosclerosis of the aorta and its distal branches. No pathologic adenopathy. Reproductive: Status post hysterectomy. No adnexal masses. Other: No free fluid or free intraperitoneal gas. There is a fat containing supraumbilical midline ventral hernia. Just inferior to this, there is a small ventral hernia containing a portion of the anti mesenteric wall of transverse colon, consistent with a Richter type hernia. No incarceration or obstruction. Musculoskeletal: No acute or destructive bony lesions. Chronic L1 compression deformity unchanged. Reconstructed images demonstrate no additional findings. IMPRESSION: 1. No acute intrathoracic, intra-abdominal, or intrapelvic trauma. 2. Grossly stable 11 mm right upper  lobe ground-glass nodule, most consistent with benign etiology given long-term stability. 3. Upper abdominal midline ventral hernias as above. Small fat containing hernia, with an adjacent focal Richter type hernia containing a portion of the transverse colon. No obstruction or incarceration. 4. Distal colonic diverticulosis without diverticulitis. 5. Aortic Atherosclerosis (ICD10-I70.0). Coronary artery atherosclerosis. Electronically Signed   By: MRanda NgoM.D.   On: 07/26/2022 18:17    Microbiology: Results for orders placed or performed in visit on 09/08/20  Urine Culture     Status: None   Collection Time: 09/08/20 12:08 PM   Specimen: Urine  Result Value Ref Range Status   MICRO NUMBER: [id number] Final   SPECIMEN QUALITY: Adequate  Final   Sample Source NOT GIVEN  Final   STATUS: FINAL  Final   ISOLATE 1:   Final    10,000-49,000 CFU/mL of Non-uropathogenic Gram positive organism May represent colonizers from external and internal genitalia. No further testing (including susceptibility) will be performed.    Labs: CBC: Recent Labs  Lab 07/26/22 1417 07/27/22 0508  WBC 10.0 11.4*  NEUTROABS 7.3  --   HGB 12.5 12.6  HCT 39.5 39.3  MCV 87.8 86.9  PLT 267 2295  Basic Metabolic Panel: Recent Labs  Lab 07/26/22 1417 07/27/22 0508  NA 136 139  K 4.0 3.5  CL 103 108  CO2 25 27  GLUCOSE 139* 107*  BUN 15 11  CREATININE 0.99 0.82  CALCIUM 9.0 8.4*   Liver Function Tests: Recent Labs  Lab 07/26/22 1417 07/27/22 0508  AST 20 18  ALT 12 11  ALKPHOS 57 51  BILITOT 0.7 1.2  PROT 7.0 6.4*  ALBUMIN 3.7 3.5   CBG: No results for input(s): "GLUCAP" in the last 168 hours.  Discharge time spent: approximately 25 minutes spent on discharge counseling, evaluation of patient on day of discharge, and coordination of discharge planning with nursing, social work, pharmacy and case management  Signed: Edwin Dada, MD [facility] 07/28/2022

## 2022-07-29 DIAGNOSIS — H9193 Unspecified hearing loss, bilateral: Secondary | ICD-10-CM | POA: Diagnosis not present

## 2022-07-29 DIAGNOSIS — R5381 Other malaise: Secondary | ICD-10-CM | POA: Diagnosis not present

## 2022-07-29 DIAGNOSIS — I25111 Atherosclerotic heart disease of native coronary artery with angina pectoris with documented spasm: Secondary | ICD-10-CM | POA: Diagnosis not present

## 2022-07-29 DIAGNOSIS — R2681 Unsteadiness on feet: Secondary | ICD-10-CM | POA: Diagnosis not present

## 2022-07-29 DIAGNOSIS — M1712 Unilateral primary osteoarthritis, left knee: Secondary | ICD-10-CM | POA: Diagnosis not present

## 2022-07-29 DIAGNOSIS — I10 Essential (primary) hypertension: Secondary | ICD-10-CM | POA: Diagnosis not present

## 2022-07-29 LAB — URINE CULTURE: Culture: >=100000 — AB

## 2022-07-30 ENCOUNTER — Telehealth: Payer: Self-pay | Admitting: *Deleted

## 2022-07-30 ENCOUNTER — Other Ambulatory Visit: Payer: Self-pay

## 2022-07-30 DIAGNOSIS — I2510 Atherosclerotic heart disease of native coronary artery without angina pectoris: Secondary | ICD-10-CM

## 2022-07-30 DIAGNOSIS — I10 Essential (primary) hypertension: Secondary | ICD-10-CM

## 2022-07-30 NOTE — Chronic Care Management (AMB) (Signed)
  Care Coordination   Note   07/30/2022 Name: Ashlee Reyes MRN: [id number] DOB: 1921/06/02  Ashlee Reyes is a 88 y.o. year old female who sees Tower, Wynelle Fanny, MD for primary care. I reached out to Hardin Negus by phone today to offer care coordination services.  [name] was given information about Care Coordination services today including:   The Care Coordination services include support from the care team which includes your Nurse Coordinator, Clinical Social Worker, or Pharmacist.  The Care Coordination team is here to help remove barriers to the health concerns and goals most important to you. Care Coordination services are voluntary, and the patient may decline or stop services at any time by request to their care team member.   Care Coordination Consent Status: Patient did not agree to participate in care coordination services at this time.  Follow up plan:  Outreach to schedule referral with RN - per pt daughter [name] (legal guardian) pt is in nursing home at this time. Contact info given if/when pt is able to come home   Encounter Outcome:  Pt. Refused  Julian Hy, [place] Direct Dial: [phone]

## 2022-07-31 ENCOUNTER — Telehealth: Payer: Self-pay | Admitting: Student

## 2022-07-31 NOTE — Telephone Encounter (Signed)
Received call from patient's daughter [name], she reports patient was hospitalized d/t fall and has been discharged to [facility]. She would like for palliative to continue following patient. Will notify palliative admin to request order.

## 2022-08-03 ENCOUNTER — Other Ambulatory Visit: Payer: Self-pay | Admitting: *Deleted

## 2022-08-03 NOTE — Patient Outreach (Signed)
[facility] Post- Acute Care Coordinator follow up.   Mrs. Korol remains in [facility]. Update received from [place], [place] SW indicating anticipated transition plan is to return home with daughter. Care plan meeting to be scheduled.   Will continue to follow.   Marthenia Rolling, MSN, RN,BSN [place] Acute Care Coordinator [phone] [facility]) [phone]  (Toll free office)

## 2022-08-06 ENCOUNTER — Other Ambulatory Visit: Payer: Self-pay | Admitting: *Deleted

## 2022-08-06 NOTE — Patient Outreach (Signed)
[facility] Post- Acute Care Coordinator follow up.  Facility site visit to [facility]. Met with Bryson Ha, SNF SW. Bryson Ha reports Mrs. Rayon has some confusion. Daughter is primary contact. Care plan meeting to be scheduled for this week.   Went to bedside. Family not present. Mrs. Parthasarathy pleasantly confused. Left [facility] Care Management brochure, writer's contact information, and 24-hr nurse advice line magnet at bedside.   Will continue to follow and plan outreach to daughter [name] as appropriate.    Marthenia Rolling, MSN, RN,BSN [place] Acute Care Coordinator [phone] [facility]) [phone]  (Toll free office)

## 2022-08-17 ENCOUNTER — Other Ambulatory Visit: Payer: Self-pay | Admitting: *Deleted

## 2022-08-17 ENCOUNTER — Non-Acute Institutional Stay: Payer: Medicare Other | Admitting: *Deleted

## 2022-08-17 ENCOUNTER — Non-Acute Institutional Stay: Payer: Medicare Other

## 2022-08-17 DIAGNOSIS — Z515 Encounter for palliative care: Secondary | ICD-10-CM

## 2022-08-17 NOTE — Patient Outreach (Signed)
[place] Coordinator follow up.   Verified with [facility] Mrs. Pask has transitioned to LTC at [facility].   No identifiable [facility] care coordination needs.    Marthenia Rolling, MSN, RN,BSN [place] Acute Care Coordinator [phone] [facility]) [phone]  (Toll free office)

## 2022-09-06 ENCOUNTER — Telehealth: Payer: Self-pay | Admitting: Family Medicine

## 2022-09-06 NOTE — Telephone Encounter (Signed)
Ashlee Reyes called in and stated that Ashlee Reyes passed away on 09/24/2022.

## 2022-09-07 NOTE — Progress Notes (Signed)
COMMUNITY PALLIATIVE CARE SW NOTE  PATIENT NAME: Ashlee Reyes DOB: 1921-10-06 MRN: [id number]  PRIMARY CARE PROVIDER: Abner Greenspan, MD  RESPONSIBLE PARTY:  Acct ID - Guarantor Home Phone Work Phone Relationship Acct Type  [id number] Max [phone]  Self P/F     [address], [place], [place] [address]   SOCIAL WORK INITIAL PALLIATIVE CARE VISIT  PC SW and RN-M. Nadara Mustard completed a visit with patient at [facility] for an initial palliative care visit with patient . Patient was present with her daughter and son-Robert, who is also a patient at the facility. The team provided education regarding palliative care services to them. Her daughter verbalized understanding of the services and is open to support from the team. Patient denied pain, but was in and out of sleep during this visit. She report intermittent pain to her lower back that is treated with pain medication. This pain normally occurs in the afternoons. Patient came to the SNF 2 wk's ago from home following a fall. Patient's appetite varies depends on her pain level. He is total is care except for feeding. She is incontinent of bowel and bladder. Patient and her daughter verbalized no immediate concerns, but is happy to have support of palliative care.  The team to follow-up with patient in 6-8 weeks, but contact information was reinforced, as family or facility can call with any changes in patient's condition.   Duration of visit and documentation: 60 minutes  Katheren Puller, LCSW

## 2022-09-11 ENCOUNTER — Other Ambulatory Visit: Payer: Medicare Other | Admitting: Student

## 2022-09-11 DIAGNOSIS — R079 Chest pain, unspecified: Secondary | ICD-10-CM | POA: Diagnosis not present

## 2022-09-18 NOTE — Progress Notes (Signed)
[facility] COMMUNITY PALLIATIVE CARE RN NOTE  PATIENT NAME: Ashlee Reyes DOB: 15-Mar-1921 MRN: [id number]  PRIMARY CARE PROVIDER: Abner Greenspan, MD  RESPONSIBLE PARTY: [name] (daughter) Acct ID - Guarantor Home Phone Work Phone Relationship Acct Type  [id number] SHAAKIRA, [phone]  Self P/F     [address], [place], [place] [id number]   RN/SW team met with patient and her daughter outside on the porch of the facility ([facility]). Patient's son Herbie Baltimore (who is also a resident at [facility]) was also present. She was recently in their rehab portion of the facility s/p fall which occurred on 07/26/22. She was admitted to facility on 07/28/22 She just moved to the long term care portion of the facility today.  Cognitive: Patient is alert and oriented to person/place. Intermittent confusion and forgetful. Repetitive. Falling asleep on and off throughout visit.  Pain: Patient denies pain at current time, but reports she does experience pain in her back from recent fall, especially with movement. It is worse in the afternoon. She is currently taking Hydrocodone and Tramadol to help. Daughter's main goal is for patient to be comfortable.   Respiratory: Respirations are even, regular and unlabored.  Mobility: Patient is requires extensive assistance with all ADLs, but is able to feed herself independently.   Appetite: Intake is variable depending on pain level  GI/GU: Incontinent of both bowel and bladder   CODE STATUS: DNR ADVANCED DIRECTIVES: Y MOST FORM: no PPS: 30%    Daryl Eastern, RN BSN
